# Patient Record
Sex: Female | Born: 1945 | Race: White | Hispanic: No | Marital: Married | Smoking: Never smoker
Health system: Southern US, Community
[De-identification: ages and names within clinical notes are randomized; demographics above are authoritative.]

## PROBLEM LIST (undated history)

## (undated) DIAGNOSIS — M1990 Unspecified osteoarthritis, unspecified site: Secondary | ICD-10-CM

## (undated) DIAGNOSIS — R590 Localized enlarged lymph nodes: Secondary | ICD-10-CM

## (undated) DIAGNOSIS — Z0000 Encounter for general adult medical examination without abnormal findings: Secondary | ICD-10-CM

## (undated) DIAGNOSIS — E782 Mixed hyperlipidemia: Secondary | ICD-10-CM

## (undated) DIAGNOSIS — M25511 Pain in right shoulder: Secondary | ICD-10-CM

## (undated) DIAGNOSIS — M25512 Pain in left shoulder: Secondary | ICD-10-CM

## (undated) DIAGNOSIS — B019 Varicella without complication: Secondary | ICD-10-CM

## (undated) DIAGNOSIS — E871 Hypo-osmolality and hyponatremia: Secondary | ICD-10-CM

## (undated) DIAGNOSIS — Z780 Asymptomatic menopausal state: Secondary | ICD-10-CM

## (undated) DIAGNOSIS — I679 Cerebrovascular disease, unspecified: Secondary | ICD-10-CM

## (undated) DIAGNOSIS — Z8619 Personal history of other infectious and parasitic diseases: Secondary | ICD-10-CM

## (undated) DIAGNOSIS — G4730 Sleep apnea, unspecified: Secondary | ICD-10-CM

## (undated) DIAGNOSIS — E663 Overweight: Secondary | ICD-10-CM

## (undated) DIAGNOSIS — K219 Gastro-esophageal reflux disease without esophagitis: Secondary | ICD-10-CM

## (undated) DIAGNOSIS — Z87898 Personal history of other specified conditions: Secondary | ICD-10-CM

## (undated) DIAGNOSIS — E876 Hypokalemia: Principal | ICD-10-CM

## (undated) DIAGNOSIS — E669 Obesity, unspecified: Secondary | ICD-10-CM

## (undated) DIAGNOSIS — T7840XA Allergy, unspecified, initial encounter: Secondary | ICD-10-CM

## (undated) DIAGNOSIS — J40 Bronchitis, not specified as acute or chronic: Secondary | ICD-10-CM

## (undated) DIAGNOSIS — M8580 Other specified disorders of bone density and structure, unspecified site: Secondary | ICD-10-CM

## (undated) DIAGNOSIS — I10 Essential (primary) hypertension: Secondary | ICD-10-CM

## (undated) DIAGNOSIS — N3281 Overactive bladder: Secondary | ICD-10-CM

## (undated) DIAGNOSIS — K5730 Diverticulosis of large intestine without perforation or abscess without bleeding: Secondary | ICD-10-CM

## (undated) DIAGNOSIS — K589 Irritable bowel syndrome without diarrhea: Secondary | ICD-10-CM

## (undated) HISTORY — DX: Overactive bladder: N32.81

## (undated) HISTORY — DX: Hypo-osmolality and hyponatremia: E87.1

## (undated) HISTORY — DX: Other specified disorders of bone density and structure, unspecified site: M85.80

## (undated) HISTORY — DX: Personal history of other infectious and parasitic diseases: Z86.19

## (undated) HISTORY — DX: Allergy, unspecified, initial encounter: T78.40XA

## (undated) HISTORY — DX: Varicella without complication: B01.9

## (undated) HISTORY — DX: Irritable bowel syndrome without diarrhea: K58.9

## (undated) HISTORY — DX: Personal history of other specified conditions: Z87.898

## (undated) HISTORY — DX: Sleep apnea, unspecified: G47.30

## (undated) HISTORY — DX: Hypokalemia: E87.6

## (undated) HISTORY — PX: TONSILLECTOMY: SUR1361

## (undated) HISTORY — DX: Gastro-esophageal reflux disease without esophagitis: K21.9

## (undated) HISTORY — DX: Overweight: E66.3

## (undated) HISTORY — DX: Cerebrovascular disease, unspecified: I67.9

## (undated) HISTORY — DX: Unspecified osteoarthritis, unspecified site: M19.90

## (undated) HISTORY — PX: CATARACT EXTRACTION, BILATERAL: SHX1313

## (undated) HISTORY — DX: Pain in left shoulder: M25.512

## (undated) HISTORY — DX: Bronchitis, not specified as acute or chronic: J40

## (undated) HISTORY — DX: Encounter for general adult medical examination without abnormal findings: Z00.00

## (undated) HISTORY — DX: Diverticulosis of large intestine without perforation or abscess without bleeding: K57.30

## (undated) HISTORY — DX: Asymptomatic menopausal state: Z78.0

## (undated) HISTORY — DX: Obesity, unspecified: E66.9

## (undated) HISTORY — DX: Mixed hyperlipidemia: E78.2

## (undated) HISTORY — DX: Localized enlarged lymph nodes: R59.0

## (undated) HISTORY — DX: Pain in right shoulder: M25.511

## (undated) HISTORY — DX: Essential (primary) hypertension: I10

---

## 1978-03-06 HISTORY — PX: BUNIONECTOMY: SHX129

## 1984-03-06 HISTORY — PX: BREAST REDUCTION SURGERY: SHX8

## 1999-01-22 ENCOUNTER — Encounter: Payer: Self-pay | Admitting: Emergency Medicine

## 1999-01-22 ENCOUNTER — Emergency Department (HOSPITAL_COMMUNITY): Admission: EM | Admit: 1999-01-22 | Discharge: 1999-01-22 | Payer: Self-pay | Admitting: Emergency Medicine

## 1999-10-12 ENCOUNTER — Other Ambulatory Visit: Admission: RE | Admit: 1999-10-12 | Discharge: 1999-10-12 | Payer: Self-pay | Admitting: Obstetrics and Gynecology

## 2001-01-17 ENCOUNTER — Other Ambulatory Visit: Admission: RE | Admit: 2001-01-17 | Discharge: 2001-01-17 | Payer: Self-pay | Admitting: Obstetrics and Gynecology

## 2002-03-13 ENCOUNTER — Other Ambulatory Visit: Admission: RE | Admit: 2002-03-13 | Discharge: 2002-03-13 | Payer: Self-pay | Admitting: Obstetrics and Gynecology

## 2003-09-10 ENCOUNTER — Other Ambulatory Visit: Admission: RE | Admit: 2003-09-10 | Discharge: 2003-09-10 | Payer: Self-pay | Admitting: Obstetrics and Gynecology

## 2004-05-19 ENCOUNTER — Ambulatory Visit: Payer: Self-pay | Admitting: Internal Medicine

## 2004-06-09 ENCOUNTER — Ambulatory Visit: Payer: Self-pay | Admitting: Internal Medicine

## 2004-08-22 ENCOUNTER — Ambulatory Visit: Payer: Self-pay | Admitting: Internal Medicine

## 2004-10-11 ENCOUNTER — Ambulatory Visit: Payer: Self-pay | Admitting: Internal Medicine

## 2005-01-18 ENCOUNTER — Other Ambulatory Visit: Admission: RE | Admit: 2005-01-18 | Discharge: 2005-01-18 | Payer: Self-pay | Admitting: Obstetrics and Gynecology

## 2006-08-22 ENCOUNTER — Ambulatory Visit: Payer: Self-pay | Admitting: Internal Medicine

## 2006-11-08 ENCOUNTER — Ambulatory Visit: Payer: Self-pay | Admitting: Internal Medicine

## 2006-11-08 LAB — CONVERTED CEMR LAB
ALT: 24 units/L (ref 0–35)
AST: 26 units/L (ref 0–37)
Albumin: 3.9 g/dL (ref 3.5–5.2)
Alkaline Phosphatase: 88 units/L (ref 39–117)
BUN: 6 mg/dL (ref 6–23)
Basophils Absolute: 0 10*3/uL (ref 0.0–0.1)
Basophils Relative: 0.8 % (ref 0.0–1.0)
Bilirubin Urine: NEGATIVE
Bilirubin, Direct: 0.1 mg/dL (ref 0.0–0.3)
CO2: 29 meq/L (ref 19–32)
CRP, High Sensitivity: 1 (ref 0.00–5.00)
Calcium: 9.5 mg/dL (ref 8.4–10.5)
Chloride: 105 meq/L (ref 96–112)
Cholesterol: 266 mg/dL (ref 0–200)
Creatinine, Ser: 0.7 mg/dL (ref 0.4–1.2)
Crystals: NEGATIVE
Eosinophils Absolute: 0.2 10*3/uL (ref 0.0–0.6)
Eosinophils Relative: 5.1 % — ABNORMAL HIGH (ref 0.0–5.0)
GFR calc Af Amer: 110 mL/min
GFR calc non Af Amer: 91 mL/min
Glucose, Bld: 100 mg/dL — ABNORMAL HIGH (ref 70–99)
HCT: 40.3 % (ref 36.0–46.0)
HDL: 38.2 mg/dL — ABNORMAL LOW (ref >39.0)
Hemoglobin, Urine: NEGATIVE
Hemoglobin: 13.9 g/dL (ref 12.0–15.0)
Ketones, ur: NEGATIVE mg/dL
Lymphocytes Relative: 43.7 % (ref 12.0–46.0)
MCHC: 34.5 g/dL (ref 30.0–36.0)
MCV: 83.3 fL (ref 78.0–100.0)
Monocytes Absolute: 0.5 10*3/uL (ref 0.2–0.7)
Monocytes Relative: 10.3 % (ref 3.0–11.0)
Mucus, UA: NEGATIVE
Neutro Abs: 2 10*3/uL (ref 1.4–7.7)
Neutrophils Relative %: 40.1 % — ABNORMAL LOW (ref 43.0–77.0)
Nitrite: NEGATIVE
Platelets: 289 10*3/uL (ref 150–400)
Potassium: 3.9 meq/L (ref 3.5–5.1)
RBC / HPF: NONE SEEN
RBC: 4.84 M/uL (ref 3.87–5.11)
RDW: 15.4 % — ABNORMAL HIGH (ref 11.5–14.6)
Sed Rate: 31 mm/hr — ABNORMAL HIGH (ref 0–25)
Sodium: 140 meq/L (ref 135–145)
Specific Gravity, Urine: 1.01 (ref 1.000–1.03)
TSH: 0.59 microintl units/mL (ref 0.35–5.50)
Total Bilirubin: 1 mg/dL (ref 0.3–1.2)
Total CHOL/HDL Ratio: 7
Total Protein, Urine: NEGATIVE mg/dL
Total Protein: 7.7 g/dL (ref 6.0–8.3)
Triglycerides: 124 mg/dL (ref 0–149)
Urine Glucose: NEGATIVE mg/dL
Urobilinogen, UA: 0.2 (ref 0.0–1.0)
VLDL: 25 mg/dL (ref 0–40)
Vit D, 1,25-Dihydroxy: 43 (ref 20–57)
WBC: 4.9 10*3/uL (ref 4.5–10.5)
pH: 7.5 (ref 5.0–8.0)

## 2006-11-15 ENCOUNTER — Ambulatory Visit: Payer: Self-pay | Admitting: Internal Medicine

## 2007-02-20 ENCOUNTER — Ambulatory Visit: Payer: Self-pay | Admitting: Internal Medicine

## 2007-02-20 DIAGNOSIS — K219 Gastro-esophageal reflux disease without esophagitis: Secondary | ICD-10-CM | POA: Insufficient documentation

## 2007-02-20 DIAGNOSIS — R05 Cough: Secondary | ICD-10-CM

## 2007-02-20 DIAGNOSIS — I10 Essential (primary) hypertension: Secondary | ICD-10-CM | POA: Insufficient documentation

## 2007-02-20 DIAGNOSIS — E782 Mixed hyperlipidemia: Secondary | ICD-10-CM

## 2007-02-20 DIAGNOSIS — R059 Cough, unspecified: Secondary | ICD-10-CM | POA: Insufficient documentation

## 2007-02-20 DIAGNOSIS — K5730 Diverticulosis of large intestine without perforation or abscess without bleeding: Secondary | ICD-10-CM | POA: Insufficient documentation

## 2007-02-20 HISTORY — DX: Mixed hyperlipidemia: E78.2

## 2007-03-13 ENCOUNTER — Ambulatory Visit: Payer: Self-pay | Admitting: Internal Medicine

## 2007-03-27 LAB — CONVERTED CEMR LAB
Cholesterol: 220 mg/dL (ref 0–200)
Direct LDL: 159 mg/dL
HDL: 34 mg/dL — ABNORMAL LOW (ref >39.0)
Total CHOL/HDL Ratio: 6.5
Triglycerides: 157 mg/dL — ABNORMAL HIGH (ref 0–149)
VLDL: 31 mg/dL (ref 0–40)

## 2007-04-03 ENCOUNTER — Ambulatory Visit: Payer: Self-pay | Admitting: Internal Medicine

## 2007-12-25 ENCOUNTER — Ambulatory Visit: Payer: Self-pay | Admitting: Internal Medicine

## 2007-12-27 LAB — CONVERTED CEMR LAB
CRP, High Sensitivity: 3 (ref 0.00–5.00)
Cholesterol: 241 mg/dL (ref 0–200)
Direct LDL: 159.2 mg/dL
HDL: 35.7 mg/dL — ABNORMAL LOW (ref >39.0)
Total CHOL/HDL Ratio: 6.8
Triglycerides: 146 mg/dL (ref 0–149)
VLDL: 29 mg/dL (ref 0–40)

## 2008-07-22 ENCOUNTER — Ambulatory Visit: Payer: Self-pay | Admitting: Internal Medicine

## 2008-07-22 LAB — CONVERTED CEMR LAB
ALT: 24 units/L (ref 0–35)
AST: 22 units/L (ref 0–37)
Albumin: 3.7 g/dL (ref 3.5–5.2)
Alkaline Phosphatase: 76 units/L (ref 39–117)
BUN: 12 mg/dL (ref 6–23)
Basophils Absolute: 0.1 10*3/uL (ref 0.0–0.1)
Basophils Relative: 1.4 % (ref 0.0–3.0)
Bilirubin, Direct: 0.1 mg/dL (ref 0.0–0.3)
CO2: 33 meq/L — ABNORMAL HIGH (ref 19–32)
CRP, High Sensitivity: 1 (ref 0.00–5.00)
Calcium: 9.8 mg/dL (ref 8.4–10.5)
Chloride: 106 meq/L (ref 96–112)
Cholesterol: 224 mg/dL — ABNORMAL HIGH (ref 0–200)
Creatinine, Ser: 0.8 mg/dL (ref 0.4–1.2)
Direct LDL: 166 mg/dL
Eosinophils Absolute: 0.3 10*3/uL (ref 0.0–0.7)
Eosinophils Relative: 5.3 % — ABNORMAL HIGH (ref 0.0–5.0)
GFR calc non Af Amer: 77.12 mL/min (ref >60)
Glucose, Bld: 95 mg/dL (ref 70–99)
HCT: 41.6 % (ref 36.0–46.0)
HDL: 32.2 mg/dL — ABNORMAL LOW (ref >39.00)
Hemoglobin: 14.5 g/dL (ref 12.0–15.0)
Lymphocytes Relative: 41.2 % (ref 12.0–46.0)
Lymphs Abs: 2.3 10*3/uL (ref 0.7–4.0)
MCHC: 34.9 g/dL (ref 30.0–36.0)
MCV: 90.2 fL (ref 78.0–100.0)
Monocytes Absolute: 0.6 10*3/uL (ref 0.1–1.0)
Monocytes Relative: 11.4 % (ref 3.0–12.0)
Neutro Abs: 2.2 10*3/uL (ref 1.4–7.7)
Neutrophils Relative %: 40.7 % — ABNORMAL LOW (ref 43.0–77.0)
Platelets: 272 10*3/uL (ref 150.0–400.0)
Potassium: 4.4 meq/L (ref 3.5–5.1)
RBC: 4.61 M/uL (ref 3.87–5.11)
RDW: 11.5 % (ref 11.5–14.6)
Sodium: 144 meq/L (ref 135–145)
TSH: 1.21 microintl units/mL (ref 0.35–5.50)
Total Bilirubin: 0.7 mg/dL (ref 0.3–1.2)
Total CHOL/HDL Ratio: 7
Total Protein: 7.2 g/dL (ref 6.0–8.3)
Triglycerides: 173 mg/dL — ABNORMAL HIGH (ref 0.0–149.0)
VLDL: 34.6 mg/dL (ref 0.0–40.0)
WBC: 5.5 10*3/uL (ref 4.5–10.5)

## 2008-07-30 ENCOUNTER — Ambulatory Visit: Payer: Self-pay | Admitting: Internal Medicine

## 2008-07-30 DIAGNOSIS — G4710 Hypersomnia, unspecified: Secondary | ICD-10-CM | POA: Insufficient documentation

## 2008-09-23 ENCOUNTER — Ambulatory Visit: Payer: Self-pay | Admitting: Internal Medicine

## 2008-10-07 ENCOUNTER — Telehealth (INDEPENDENT_AMBULATORY_CARE_PROVIDER_SITE_OTHER): Payer: Self-pay | Admitting: *Deleted

## 2009-01-13 ENCOUNTER — Ambulatory Visit: Payer: Self-pay | Admitting: Internal Medicine

## 2009-01-13 DIAGNOSIS — J310 Chronic rhinitis: Secondary | ICD-10-CM

## 2009-01-13 HISTORY — DX: Chronic rhinitis: J31.0

## 2009-01-13 LAB — CONVERTED CEMR LAB
Cholesterol: 198 mg/dL (ref 0–200)
HDL: 32.1 mg/dL — ABNORMAL LOW (ref >39.00)
LDL Cholesterol: 135 mg/dL — ABNORMAL HIGH (ref 0–99)
Total CHOL/HDL Ratio: 6
Triglycerides: 157 mg/dL — ABNORMAL HIGH (ref 0.0–149.0)
VLDL: 31.4 mg/dL (ref 0.0–40.0)

## 2009-01-14 LAB — CONVERTED CEMR LAB: Hep B S Ab: NEGATIVE

## 2009-03-06 HISTORY — PX: COLONOSCOPY: SHX174

## 2009-04-02 ENCOUNTER — Encounter (INDEPENDENT_AMBULATORY_CARE_PROVIDER_SITE_OTHER): Payer: Self-pay | Admitting: *Deleted

## 2009-04-09 ENCOUNTER — Telehealth: Payer: Self-pay | Admitting: Internal Medicine

## 2009-04-15 ENCOUNTER — Encounter (INDEPENDENT_AMBULATORY_CARE_PROVIDER_SITE_OTHER): Payer: Self-pay | Admitting: *Deleted

## 2009-04-29 ENCOUNTER — Encounter (INDEPENDENT_AMBULATORY_CARE_PROVIDER_SITE_OTHER): Payer: Self-pay | Admitting: *Deleted

## 2009-04-30 ENCOUNTER — Ambulatory Visit: Payer: Self-pay | Admitting: Gastroenterology

## 2009-05-05 ENCOUNTER — Ambulatory Visit: Payer: Self-pay | Admitting: Internal Medicine

## 2009-05-05 LAB — CONVERTED CEMR LAB
BUN: 11 mg/dL (ref 6–23)
CO2: 31 meq/L (ref 19–32)
CRP, High Sensitivity: 2.7 (ref 0.00–5.00)
Calcium: 9.1 mg/dL (ref 8.4–10.5)
Chloride: 103 meq/L (ref 96–112)
Cholesterol: 221 mg/dL — ABNORMAL HIGH (ref 0–200)
Creatinine, Ser: 0.8 mg/dL (ref 0.4–1.2)
Direct LDL: 160.8 mg/dL
GFR calc non Af Amer: 76.92 mL/min (ref >60)
Glucose, Bld: 88 mg/dL (ref 70–99)
HDL: 43.6 mg/dL (ref >39.00)
Potassium: 3.9 meq/L (ref 3.5–5.1)
Sodium: 142 meq/L (ref 135–145)
TSH: 0.87 microintl units/mL (ref 0.35–5.50)
Total CHOL/HDL Ratio: 5
Triglycerides: 125 mg/dL (ref 0.0–149.0)
VLDL: 25 mg/dL (ref 0.0–40.0)

## 2009-05-12 ENCOUNTER — Ambulatory Visit: Payer: Self-pay | Admitting: Gastroenterology

## 2009-10-04 LAB — HM PAP SMEAR

## 2009-12-28 ENCOUNTER — Telehealth (INDEPENDENT_AMBULATORY_CARE_PROVIDER_SITE_OTHER): Payer: Self-pay | Admitting: *Deleted

## 2010-01-13 ENCOUNTER — Ambulatory Visit: Payer: Self-pay | Admitting: Internal Medicine

## 2010-01-13 ENCOUNTER — Encounter: Payer: Self-pay | Admitting: Internal Medicine

## 2010-01-13 DIAGNOSIS — L820 Inflamed seborrheic keratosis: Secondary | ICD-10-CM | POA: Insufficient documentation

## 2010-01-13 DIAGNOSIS — R079 Chest pain, unspecified: Secondary | ICD-10-CM | POA: Insufficient documentation

## 2010-04-07 NOTE — Letter (Signed)
Summary: [facility] Instructions  [facility]  [address] [place], [place] [address]   Phone: [phone]  Fax: [phone]       Erika [name]    07/13/1945    MRN: [id number]        Procedure Day Dorna Bloom:  [facility]  05/12/09     Arrival Time:  10:30AM     Procedure Time:  11:30AM     Location of Procedure:                    _X _  [facility] (4th Floor)                        PREPARATION FOR COLONOSCOPY WITH MOVIPREP   Starting 5 days prior to your procedure 05/07/09 do not eat nuts, seeds, popcorn, corn, beans, peas,  salads, or any raw vegetables.  Do not take any fiber supplements (e.g. Metamucil, Citrucel, and Benefiber).  THE DAY BEFORE YOUR PROCEDURE         DATE: 05/11/09  DAY: TUESDAY  1.  Drink clear liquids the entire day-NO SOLID FOOD  2.  Do not drink anything colored red or purple.  Avoid juices with pulp.  No orange juice.  3.  Drink at least 64 oz. (8 glasses) of fluid/clear liquids during the day to prevent dehydration and help the prep work efficiently.  CLEAR LIQUIDS INCLUDE: Water Jello Ice Popsicles Tea (sugar ok, no milk/cream) Powdered fruit flavored drinks Coffee (sugar ok, no milk/cream) Gatorade Juice: apple, white grape, white cranberry  Lemonade Clear bullion, consomm, broth Carbonated beverages (any kind) Strained chicken noodle soup Hard Candy                             4.  In the morning, mix first dose of MoviPrep solution:    Empty 1 Pouch A and 1 Pouch B into the disposable container    Add lukewarm drinking water to the top line of the container. Mix to dissolve    Refrigerate (mixed solution should be used within 24 hrs)  5.  Begin drinking the prep at 5:00 p.m. The MoviPrep container is divided by 4 marks.   Every 15 minutes drink the solution down to the next mark (approximately 8 oz) until the full liter is complete.   6.  Follow completed prep with 16 oz of clear liquid of your choice  (Nothing red or purple).  Continue to drink clear liquids until bedtime.  7.  Before going to bed, mix second dose of MoviPrep solution:    Empty 1 Pouch A and 1 Pouch B into the disposable container    Add lukewarm drinking water to the top line of the container. Mix to dissolve    Refrigerate  THE DAY OF YOUR PROCEDURE      DATE: 05/12/09  DAY: WEDNESDAY  Beginning at 6:30AM (5 hours before procedure):         1. Every 15 minutes, drink the solution down to the next mark (approx 8 oz) until the full liter is complete.  2. Follow completed prep with 16 oz. of clear liquid of your choice.    3. You may drink clear liquids until 9:30AM (2 HOURS BEFORE PROCEDURE).   MEDICATION INSTRUCTIONS  Unless otherwise instructed, you should take regular prescription medications with a small sip of water   as early as possible the morning  of your procedure.        OTHER INSTRUCTIONS  You will need a responsible adult at least 65 years of age to accompany you and drive you home.   This person must remain in the waiting room during your procedure.  Wear loose fitting clothing that is easily removed.  Leave jewelry and other valuables at home.  However, you may wish to bring a book to read or  an iPod/MP3 player to listen to music as you wait for your procedure to start.  Remove all body piercing jewelry and leave at home.  Total time from sign-in until discharge is approximately 2-3 hours.  You should go home directly after your procedure and rest.  You can resume normal activities the  day after your procedure.  The day of your procedure you should not:   Drive   Make legal decisions   Operate machinery   Drink alcohol   Return to work  You will receive specific instructions about eating, activities and medications before you leave.    The above instructions have been reviewed and explained to me by   Wyona Almas RN  April 30, 2009 9:05 AM     I fully  understand and can verbalize these instructions _____________________________ Date _________

## 2010-04-07 NOTE — Miscellaneous (Signed)
Summary: [facility] Previsit/prep  Clinical Lists Changes  Medications: Added new medication of MOVIPREP 100 GM  SOLR (PEG-KCL-NACL-NASULF-NA ASC-C) As per prep instructions. - Signed Rx of MOVIPREP 100 GM  SOLR (PEG-KCL-NACL-NASULF-NA ASC-C) As per prep instructions.;  #1 x 0;  Signed;  Entered by: Wyona Almas RN;  Authorized by: Louis Meckel MD;  Method used: [facility] to [facility] [phone]*, [address] [place], [place]  [address], Ph: [phone] or [phone], Fax: [phone] Observations: Added new observation of NKA: T (04/30/2009 8:05)    Prescriptions: MOVIPREP 100 GM  SOLR (PEG-KCL-NACL-NASULF-NA ASC-C) As per prep instructions.  #1 x 0   Entered by:   Wyona Almas RN   Authorized by:   Louis Meckel MD   Signed by:   Wyona Almas RN on 04/30/2009   Method used:   [facility] to        [facility] [phone]* (retail)       [address]       [place], [place]  [address]       Ph: [phone] or [phone]       Fax: [phone]   RxID:   [id number]

## 2010-04-07 NOTE — Assessment & Plan Note (Signed)
Summary: Primary svc/ ext ov re cp/ bp rx   Primary Rhen Kawecki/Referring Cylee Dattilo:  Sherene Sires  CC:  HA and CP when lies down x 10 wks- pain is "dull and tight"..  History of Present Illness: 65 yowf never smoker  with moderate obesity complicated by hyperlipidemia and hypertension.  December 25, 2007 ov discouraged re wt loss, doing a "dial in" nutritionist per insurance. No ex cp, tia or claudication symptoms.  Jul 22, 2008 ov eval of cough that started around Christmas 2009  with nasal drainage green and sore throat and all resolved after treatment by dentist with amox except persistant sensation drainage of drainage day > night better with mucinex,  not sob.  sputum is tbsp at a time thick white.   Jul 30, 2008 --Returns for 1 week follow up, med review and lab review.  1. Cough is 60% better. Last visit given prednisone taper, reflux prevention w/ prilosec and zantac.  still has throat cleaing and ? drainage.  . Metorpolol changed to bystolic 2. Labs showed cholestrol borderline w/ LDL at 166, HDL 33, crp low at 1. Discussed several options w/ pt on starting meds-statin, vs diet exercise and wt loss. she has htn, and family hx of stroke. LDL goal <130. she prefers trial of diet/exercise and wt loss for 4-6 months then recheck.  3. c/o low energy, fatigue and daytime sleepiness, snores. discussed possible sleep apnea. she wishes to change diet, exercise and lose weight if still having symptoms will consider sleep study at next ov in 4-6 months.  4. c/o reflux at night, not taking prilosec two times a day (only once daily most days. )  September 23, 2008 --Returns for follow up. Pt is feeling much better. Cough is totally resolved. Feeling close to baseline. Tolerating Bystolic w/out known trouble. Reflux is improved .    January 13, 2009 Followup.  Pt c/p runny nose and cough x several months.  She states that her cough is sometimes prod with yellow sputum. better on alerest     May 05, 2009  cc  f/u hbp. Pt denies any significant sore throat, dysphagia, itching, sneezing,  nasal congestion or excess secretions,  fever, chills, sweats, unintended wt loss, pleuritic or exertional cp, hempoptysis.   page 2 January 13, 2010 ov cc new onset cp first develped 65 y ago attributed to gerd and resolved on treatment then recurred sporadically but much worse since [facility] september 2011  has had four episodes last 3 weeks ago, lasting typically 30 min,  center of chest no radiating no nausea, sweat, sob.  not reproducible with deep breathing or related to ex, usually occur at rest or lying down.  Pt denies any significant sore throat, dysphagia, itching, sneezing,  nasal congestion or excess secretions,  fever, chills, sweats, unintended wt loss,  classically pleuritic or exertional cp, hempoptysis, change in activity tolerance  orthopnea pnd or leg swelling   Pt also denies any obvious fluctuation in symptoms with weather or environmental change or other alleviating or aggravating factors.       Current Medications (verified): 1)  Sular 34 Mg  Tb24 (Nisoldipine) .... One Q Am 2)  Bayer Low Strength 81 Mg  Tbec (Aspirin) .... Take 1 Tablet By Mouth Once A Day 3)  Citracal Plus   Tabs (Multiple Minerals-Vitamins) .Marland Kitchen.. 1 Once Daily 4)  Multivitamins   Tabs (Multiple Vitamin) .... Take 1 Tablet By Mouth Once A Day 5)  Sanctura 20 Mg Tabs (Trospium Chloride) .Marland KitchenMarland KitchenMarland Kitchen  1 Once Daily 6)  Tylenol Pm Extra Strength 500-25 Mg  Tabs (Diphenhydramine-Apap (Sleep)) .... Take 1 Tab By Mouth At Bedtime As Needed 7)  Tums 500 Mg  Chew (Calcium Carbonate Antacid) .... As Needed 8)  Delsym 30 Mg/30ml Lqcr (Dextromethorphan Polistirex) .... 2 Tsp Every 12 Hours As Needed 9)  Zyrtec Allergy 10 Mg Tabs (Cetirizine Hcl) .Marland Kitchen.. 1 At Bedtime As Needed 10)  Mucinex Dm 30-600 Mg Xr12h-Tab (Dextromethorphan-Guaifenesin) .Marland Kitchen.. 1 To 2 Every 12 Hours As Needed 11)  Allergy Relief 4 Mg Tabs (Chlorpheniramine Maleate) .Marland Kitchen.. 1 Once Daily As  Needed 12)  Omeprazole 20 Mg Cpdr (Omeprazole) .Marland Kitchen.. 1 Every Am 13)  Bystolic 10 Mg Tabs (Nebivolol Hcl) .Marland Kitchen.. 1 Once Daily  Allergies (verified): No Known Drug Allergies  Past History:  Past Medical History: DIVERTICULOSIS OF COLON (ICD-562.10)C. colonoscopy 04/14/99 GERD (ICD-530.81) HYPERLIPIDEMIA (ICD-272.4)      Goal LDL < 130 HBP/Pos fm hx HYPERTENSION (ICD-401.9) OBESITY   -  Target wt  =   179  for BMI < 30   161 ideal HEALTH MAINTENANCE............................................................Marland KitchenWert/[place]    -  Calendar done 02/20/07, redone January 13, 2010     -  Td 11/2006    -  Pneumovax Jul 22, 2008     - CPX  Jul 22, 2008   Vital Signs:  Patient profile:   65 year old female Weight:      178 pounds O2 Sat:      96 % on Room air Temp:     97.6 degrees F oral Pulse rate:   54 / minute BP sitting:   138 / 82  (left arm)  Vitals Entered By: Vernie Murders (January 13, 2010 8:51 AM)  O2 Flow:  Room air  Physical Exam  Additional Exam:   wt 180 > 171 January 13, 2009 > 177 May 05, 2009 > 178 January 13, 2010  Ambulatory healthy appearing in no acute distress. Afeb with normal vital signs HEENT: nl dentition, turbinates, and orophanx. Nl external ear canals without cough reflex Neck without JVD/Nodes/TM Lungs clear to A and P bilaterally without cough on insp or exp maneuvers RRR no s3 or murmur or increase in P2 Abd soft and benign with nl excursion in the supine position. No bruits or organomegaly Ext warm without calf tenderness, cyanosis clubbing or edema Skin warm and dry with inlfammed seb keratosis mid back     Impression & Recommendations:  Problem # 1:  CHEST PAIN (ICD-786.50) Recurrent pattern x years and not following med calndar re maint rx with ppi and hs h2.  no features to suggest ischemia.   See instructions for specific recommendations    Each maintenance medication was reviewed in detail including most importantly the difference  between maintenance and as needed and under what circumstances the prns are to be used. This was done in the context of a medication calendar review which provided the patient with a user-friendly unambiguous mechanism for medication administration and reconciliation and provides an action plan for all active problems. It is critical that this be shown to every doctor  for modification during the office visit if necessary so the patient can use it as a working document.   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 One q am    Bystolic 10  Mg Tabs (Nebivolol hcl) .Marland Kitchen... 1 once daily     Medications Added to Medication List This Visit: 1)  Omeprazole 20 Mg Cpdr (Omeprazole) .Marland Kitchen.. 1 every am 2)  Bystolic 10 Mg Tabs (Nebivolol hcl) .Marland Kitchen.. 1 once daily  Other Orders: Est. Patient Level IV ([address])  Patient Instructions: 1)  See Patient Care Coordinator before leaving for dermatology eval of sebhorrheic keratosis. 2)  See calendar for specific medication instructions and bring it back for each and every office visit for every healthcare [place] you see.  Without it,  you may not receive the best quality medical care that we feel you deserve.  3)  Please schedule a follow-up appointment in 6 weeks, sooner if needed  Prescriptions: BYSTOLIC 10 MG TABS (NEBIVOLOL HCL) 1 once daily  #34 x 11   Entered and Authorized by:   Nyoka Cowden MD   Signed by:   Nyoka Cowden MD on 01/13/2010   Method used:   [facility] [phone]* (retail)       [address]       [place], [place]  [address]       Ph: [phone] or [phone]       Fax: [phone]   RxID:   [id number]

## 2010-04-07 NOTE — Procedures (Signed)
Summary: Colonoscopy  Patient: Erika [name] Note: All result statuses are Final unless otherwise noted.  Tests: (1) Colonoscopy (COL)   COL Colonoscopy           DONE     [facility]     [address]     [place], [place]  [address]           COLONOSCOPY PROCEDURE REPORT           PATIENT:  Erika, [name]  MR#:  [id number]     BIRTHDATE:  [birth year], 63 yrs. old  GENDER:  female           ENDOSCOPIST:  Barbette Hair. Arlyce Dice, MD     Referred by:  Charlaine Dalton. Sherene Sires, M.D.           PROCEDURE DATE:  05/12/2009     PROCEDURE:  Colonoscopy, Diagnostic     ASA CLASS:  Class II     INDICATIONS:  Routine Risk Screening           MEDICATIONS:   Fentanyl 50 mcg IV, Versed 9 mg IV           DESCRIPTION OF PROCEDURE:   After the risks benefits and     alternatives of the procedure were thoroughly explained, informed     consent was obtained.  Digital rectal exam was performed and     revealed no abnormalities.   The LB [id number] endoscope     was introduced through the anus and advanced to the cecum, which     was identified by both the appendix and ileocecal valve, without     limitations.  The quality of the prep was excellent, using     MoviPrep.  The instrument was then slowly withdrawn as the colon     was fully examined.     <<PROCEDUREIMAGES>>           FINDINGS:  Moderate diverticulosis was found in the sigmoid colon     (see image12).  This was otherwise a normal examination of the     colon (see image1, image3, image5, image6, image7, image8,     image13, and image14).   Retroflexed views in the rectum revealed     no abnormalities.    The scope was then withdrawn from the patient     and the procedure completed.           COMPLICATIONS:  None           ENDOSCOPIC IMPRESSION:     1) Moderate diverticulosis in the sigmoid colon     2) Otherwise normal examination     RECOMMENDATIONS:     1) Continue current colorectal screening recommendations for  "routine risk" patients with a repeat colonoscopy in 10 years.           REPEAT EXAM:  In 10 year(s) for Colonoscopy.           ______________________________     Barbette Hair. Arlyce Dice, MD           CC:           n.     eSIGNED:   Barbette Hair. Ricki Vanhandel at 05/12/2009 12:24 PM           Hilbert Corrigan, [id number]  Note: An exclamation mark (!) indicates a result that was not dispersed into the flowsheet. Document Creation Date: 05/12/2009 12:24 PM _______________________________________________________________________  (1) Order result status: Final Collection or observation date-time: 05/12/2009 12:16  Requested date-time:  Receipt date-time:  Reported date-time:  Referring Physician:   Ordering Physician: Melvia Heaps [phone]) Specimen Source:  Source: Launa Grill Order Number: [id number] Lab site:   Appended Document: Colonoscopy    Clinical Lists Changes  Observations: Added new observation of COLONNXTDUE: 05/2019 (05/12/2009 13:37)

## 2010-04-07 NOTE — Assessment & Plan Note (Signed)
Summary: Primary svc/ ext ov   Primary Provider/Referring Provider:  Sherene Sires  CC:  Followup.   Pt states no complaints today.  She states that her insurance is about to run out and she wanted to followup before then.  Denies any complaints today.Marland Kitchen  History of Present Illness: 81 yowf never smoker  with moderate obesity complicated by hyperlipidemia and hypertension.  December 25, 2007 ov discouraged re wt loss, doing a "dial in" nutritionist per insurance. No ex cp, tia or claudication symptoms.  Jul 22, 2008 ov eval of cough that started around Christmas 2009  with nasal drainage green and sore throat and all resolved after treatment by dentist with amox except persistant sensation drainage of drainage day > night better with mucinex,  not sob.  sputum is tbsp at a time thick white.   Jul 30, 2008 --Returns for 1 week follow up, med review and lab review.  1. Cough is 60% better. Last visit given prednisone taper, reflux prevention w/ prilosec and zantac.  still has throat cleaing and ? drainage.  . Metorpolol changed to bystolic 2. Labs showed cholestrol borderline w/ LDL at 166, HDL 33, crp low at 1. Discussed several options w/ pt on starting meds-statin, vs diet exercise and wt loss. she has htn, and family hx of stroke. LDL goal <130. she prefers trial of diet/exercise and wt loss for 4-6 months then recheck.  3. c/o low energy, fatigue and daytime sleepiness, snores. discussed possible sleep apnea. she wishes to change diet, exercise and lose weight if still having symptoms will consider sleep study at next ov in 4-6 months.  4. c/o reflux at night, not taking prilosec two times a day (only once daily most days. )  September 23, 2008 --Returns for follow up. Pt is feeling much better. Cough is totally resolved. Feeling close to baseline. Tolerating Bystolic w/out known trouble. Reflux is improved .    January 13, 2009 Followup.  Pt c/p runny nose and cough x several months.  She states that  her cough is sometimes prod with yellow sputum. better on alerex  May 05, 2009 Followup.   Pt states no complaints today. Pt denies any significant sore throat, dysphagia, itching, sneezing,  nasal congestion or excess secretions,  fever, chills, sweats, unintended wt loss, pleuritic or exertional cp, hempoptysis.   Current Medications (verified): 1)  Evista 60 Mg  Tabs (Raloxifene Hcl) .... Take 1 Tablet By Mouth Once A Day 2)  Sular 34 Mg  Tb24 (Nisoldipine) .... One Q Am 3)  Bayer Low Strength 81 Mg  Tbec (Aspirin) .... Take 1 Tablet By Mouth Once A Day 4)  Citracal Plus   Tabs (Multiple Minerals-Vitamins) .Marland Kitchen.. 1 Once Daily 5)  Bystolic 10 Mg  Tabs (Nebivolol Hcl) .... One Tablet Daily 6)  Multivitamins   Tabs (Multiple Vitamin) .... Take 1 Tablet By Mouth Once A Day 7)  Sanctura 20 Mg Tabs (Trospium Chloride) .Marland Kitchen.. 1 Once Daily 8)  Tylenol Pm Extra Strength 500-25 Mg  Tabs (Diphenhydramine-Apap (Sleep)) .... Take 1 Tab By Mouth At Bedtime As Needed 9)  Advil 100 Mg  Tabs (Ibuprofen) .... Use As Directed 10)  Tums 500 Mg  Chew (Calcium Carbonate Antacid) .... As Needed 11)  Delsym 30 Mg/5ml Lqcr (Dextromethorphan Polistirex) .... 2 Tsp Every 12 Hours As Needed 12)  Zyrtec Allergy 10 Mg Tabs (Cetirizine Hcl) .Marland Kitchen.. 1 At Bedtime As Needed 13)  Moviprep 100 Gm  Solr (Peg-Kcl-Nacl-Nasulf-Na Asc-C) .... As Per Prep  Instructions. 14)  Mucinex Dm 30-600 Mg Xr12h-Tab (Dextromethorphan-Guaifenesin) .Marland Kitchen.. 1 To 2 Every 12 Hours As Needed 15)  Allergy Relief 4 Mg Tabs (Chlorpheniramine Maleate) .Marland Kitchen.. 1 Once Daily As Needed  Allergies (verified): No Known Drug Allergies  Past History:  Past Medical History: DIVERTICULOSIS OF COLON (ICD-562.10)C. colonoscopy 04/14/99 GERD (ICD-530.81) HYPERLIPIDEMIA (ICD-272.4)      Goal LDL < 130 HBP/Pos fm hx HYPERTENSION (ICD-401.9) OBESITY   -  Target wt  =   179  for BMI < 30   161 ideal HEALTH  MAINTENANCE............................................................Marland KitchenWert/Holland    -  Calendar done 02/20/07    -  Td 11/2006    -  Pneumovax Jul 22, 2008     - CPX  Jul 22, 2008   Vital Signs:  Patient profile:   65 year old female Weight:      177.50 pounds BMI:     28.75 O2 Sat:      95 % on Room air Temp:     97.7 degrees F oral Pulse rate:   62 / minute BP sitting:   130 / 80  (left arm)  Vitals Entered By: Vernie Murders (May 05, 2009 10:38 AM)  O2 Flow:  Room air  Physical Exam  Additional Exam:   wt 180 > 171 January 13, 2009 > 177 May 05, 2009  Ambulatory healthy appearing in no acute distress. Afeb with normal vital signs HEENT: nl dentition, turbinates, and orophanx. Nl external ear canals without cough reflex Neck without JVD/Nodes/TM Lungs clear to A and P bilaterally without cough on insp or exp maneuvers RRR no s3 or murmur or increase in P2 Abd soft and benign with nl excursion in the supine position. No bruits or organomegaly Ext warm without calf tenderness, cyanosis clubbing or edema Skin warm and dry without lesions     Sodium                    142 mEq/L                   135-145   Potassium                 3.9 mEq/L                   3.5-5.1   Chloride                  103 mEq/L                   96-112   Carbon Dioxide            31 mEq/L                    19-32   Glucose                   88 mg/dL                    40-98   BUN                       11 mg/dL                    1-19   Creatinine                0.8 mg/dL  0.4-1.2   Calcium                   9.1 mg/dL                   8.1-19.1   GFR                       76.92 mL/min                >60  Tests: (2) Lipid Panel (LIPID)   Cholesterol          [H]  221 mg/dL                   4-782     ATP III Classification            Desirable:  < 200 mg/dL                    Borderline High:  200 - 239 mg/dL               High:  > = 240 mg/dL   Triglycerides              125.0 mg/dL                 9.5-621.3     Normal:  <150 mg/dL     Borderline High:  086 - 199 mg/dL   HDL                       57.84 mg/dL                 >69.62   VLDL Cholesterol          25.0 mg/dL                  9.5-28.4  CHO/HDL Ratio:  CHD Risk                             5                    Men          Women     1/2 Average Risk     3.4          3.3     Average Risk          5.0          4.4     2X Average Risk          9.6          7.1     3X Average Risk          15.0          11.0                           Tests: (3) Full Range CRP (FCRP)   Full Range CRP            2.70 mg/L                   0.00-5.00     Note:  An elevated hs-CRP (>5 mg/L) should be repeated after 2 weeks to rule out recent infection or trauma.  Tests: (4) TSH (TSH)   FastTSH  0.87 uIU/mL                 0.35-5.50  Tests: (5) Cholesterol LDL - Direct (DIRLDL)  Cholesterol LDL - Direct                             160.8 mg/dL  Impression & Recommendations:  Problem # 1:  HYPERLIPIDEMIA (ICD-272.4)  Goal LDL < 130 HBP/Pos fm hx  Labs Reviewed: SGOT: 22 (07/22/2008)   SGPT: 24 (07/22/2008)   HDL:32.10 (01/13/2009), 32.20 (07/22/2008)  LDL:135 (01/13/2009) > 161 May 05, 2009 so can get back down to target with diet   DEL (12/25/2007)  Chol:198 (01/13/2009), 224 (07/22/2008)  Trig:157.0 (01/13/2009), 173.0 (07/22/2008)  Problem # 2:  HYPERTENSION (ICD-401.9)  The following medications were removed from the medication list:    Bystolic 10 Mg Tabs (Nebivolol hcl) ..... One tablet daily Her updated medication list for this problem includes:    Sular 34 Mg Tb24 (Nisoldipine) ..... One q am   Each maintenance medication was reviewed in detail including most importantly the difference between maintenance and as needed and under what circumstances the prns are to be used. This was done in the context of a medication calendar review which provided the patient with a user-friendly  unambiguous mechanism for medication administration and reconciliation and provides an action plan for all active problems. It is critical that this be shown to every doctor  for modification during the office visit if necessary so the patient can use it as a working document.   Problem # 3:  GERD (ICD-530.81) ok rx with diet only, contingencies discussed  The following medications were removed from the medication list:    Ranitidine Hcl 150 Mg Caps (Ranitidine hcl) ..... One at bedtime Her updated medication list for this problem includes:    Tums 500 Mg Chew (Calcium carbonate antacid) .Marland Kitchen... As needed  Medications Added to Medication List This Visit: 1)  Citracal Plus Tabs (Multiple minerals-vitamins) .Marland Kitchen.. 1 once daily 2)  Mucinex Dm 30-600 Mg Xr12h-tab (Dextromethorphan-guaifenesin) .Marland Kitchen.. 1 to 2 every 12 hours as needed 3)  Allergy Relief 4 Mg Tabs (Chlorpheniramine maleate) .Marland Kitchen.. 1 once daily as needed  Other Orders: TLB-BMP (Basic Metabolic Panel-BMET) (80048-METABOL) TLB-Lipid Panel (80061-LIPID) TLB-CRP-High Sensitivity (C-Reactive Protein) (86140-FCRP) TLB-TSH (Thyroid Stimulating Hormone) (84443-TSH) Est. Patient Level IV (32440)  Patient Instructions: 1)  we will mail your labs to you and call you if any changes are needed 2)  Return yearly to renew your blood pressure meds

## 2010-04-07 NOTE — Letter (Signed)
Summary: Previsit letter  [facility]  [address] [place], [place] [address]   Phone: [phone]  Fax: [phone]       04/15/2009 MRN: [id number]  Erika [name] [address] [place], [place]  [address]  Dear [name],  Welcome to the Gastroenterology Division at [facility].    You are scheduled to see a nurse for your pre-procedure visit on 04-30-09 at 8:30a.m. on the 3rd floor at [facility], [address]  We ask that you try to arrive at our office 15 minutes prior to your appointment time to allow for check-in.  Your nurse visit will consist of discussing your medical and surgical history, your immediate family medical history, and your medications.    Please bring a complete list of all your medications or, if you prefer, bring the medication bottles and we will list them.  We will need to be aware of both prescribed and over the counter drugs.  We will need to know exact dosage information as well.  If you are on blood thinners (Coumadin, Plavix, Aggrenox, Ticlid, etc.) please call our office today/prior to your appointment, as we need to consult with your physician about holding your medication.   Please be prepared to read and sign documents such as consent forms, a financial agreement, and acknowledgement forms.  If necessary, and with your consent, a friend or relative is welcome to sit-in on the nurse visit with you.  Please bring your insurance card so that we may make a copy of it.  If your insurance requires a referral to see a specialist, please bring your referral form from your primary care physician.  No co-pay is required for this nurse visit.     If you cannot keep your appointment, please call [phone] to cancel or reschedule prior to your appointment date.  This allows [place] the opportunity to schedule an appointment for another patient in need of care.    Thank you for choosing [facility] for your  medical needs.  We appreciate the opportunity to care for you.  Please visit [place] at our website  to learn more about our practice.                     Sincerely.                                                                                                                   The Gastroenterology Division

## 2010-04-07 NOTE — Progress Notes (Signed)
Summary: rx  Phone Note Call from Patient Call back at Home Phone [phone]   Caller: Patient Call For: Sharna Gabrys Reason for Call: Refill Medication Summary of Call: need refill on Sular called in to pharm. [facility] mail order rx Initial call taken by: Eugene Gavia,  April 09, 2009 9:50 AM  Follow-up for Phone Call        rx sent. pt aware. Carron Curie CMA  April 09, 2009 10:50 AM     Prescriptions: SULAR 34 MG  TB24 (NISOLDIPINE) one q am  #90 x 1   Entered by:   Carron Curie CMA   Authorized by:   Nyoka Cowden MD   Signed by:   Carron Curie CMA on 04/09/2009   Method used:   Faxed to ...       [facility] (mail-order)             , [place]         Ph: [phone]       Fax: [phone]   RxID:   [id number]

## 2010-04-07 NOTE — Progress Notes (Signed)
Summary: Bystolic refill - need clarification with ov before refills   Phone Note Call from Patient Call back at Work Phone [phone]   Caller: Patient Call For: wert Reason for Call: Talk to Nurse Summary of Call: Patient requesting refill bystolic10mg [facility]  Patient has an appt w/ Wert on 11/17. Initial call taken by: Lehman Prom,  December 28, 2009 10:36 AM  Follow-up for Phone Call        Bystolic is not on med lsit. The patient says she has been taking both Bystolic and Sular. Dr. Sherene Sires please clarify meds the patient should be taking for her BP.Michel Bickers [facility]  December 28, 2009 11:30 AM sorry for the confusion but  her meds on our list  are supposed to match up 100% with her calendar so we must have an error in our list or her calendar (in either case we have a problem)  Needs ov with all meds and calendar in hand to recheck bp before filling both (can give samples of what she's taking if she needs) Follow-up by: Nyoka Cowden MD,  December 28, 2009 3:59 PM  Additional Follow-up for Phone Call Additional follow up Details #1::        LMOMTCB Vernie Murders  December 28, 2009 4:53 PM  pt returned call.  informed pt of MW's recs as stated above.  pt verbalized her understanding.  i informed pt that per her last ov w/ MW, the bystolic was removed from her med list but she stated that the instruction paper she was given still has the bystolic on it.  appt scheduled w/ MW 11.10.11, pt to bring all meds with her to ov.  samples of bystolic 10mg  left up front for pt to pick up at her convenience.  pt verbalized her understanding.  Additional Follow-up by: Boone Master CNA/MA,  December 29, 2009 9:53 AM

## 2010-04-07 NOTE — Letter (Signed)
Summary: Colonoscopy Letter  [facility]  [address] [place], [place] [address]   Phone: [phone]  Fax: [phone]      April 02, 2009 MRN: [id number]   RUTA CAPECE [address] [place], [place]  [address]   Dear Ms. Noboa,   According to your medical record, it is time for you to schedule a Colonoscopy. The American Cancer Society recommends this procedure as a method to detect early colon cancer. Patients with a family history of colon cancer, or a personal history of colon polyps or inflammatory bowel disease are at increased risk.  This letter has beeen generated based on the recommendations made at the time of your procedure. If you feel that in your particular situation this may no longer apply, please contact our office.  Please call our office at [phone] to schedule this appointment or to update your records at your earliest convenience.  Thank you for cooperating with [place] to provide you with the very best care possible.   Sincerely,  Barbette Hair. Arlyce Dice, M.D.  [facility] [phone]

## 2010-06-06 ENCOUNTER — Encounter: Payer: Self-pay | Admitting: Internal Medicine

## 2010-06-09 ENCOUNTER — Ambulatory Visit (INDEPENDENT_AMBULATORY_CARE_PROVIDER_SITE_OTHER): Payer: Self-pay | Admitting: Internal Medicine

## 2010-06-09 ENCOUNTER — Encounter: Payer: Self-pay | Admitting: Internal Medicine

## 2010-06-09 DIAGNOSIS — E785 Hyperlipidemia, unspecified: Secondary | ICD-10-CM

## 2010-06-09 DIAGNOSIS — I10 Essential (primary) hypertension: Secondary | ICD-10-CM

## 2010-06-09 DIAGNOSIS — R059 Cough, unspecified: Secondary | ICD-10-CM

## 2010-06-09 DIAGNOSIS — R05 Cough: Secondary | ICD-10-CM

## 2010-06-09 NOTE — Patient Instructions (Addendum)
See calendar for specific medication instructions and bring it back for each and every office visit for every healthcare provider you see.  Without it,  you may not receive the best quality medical care that we feel you deserve.  You will note that the calendar groups together  your maintenance  medications that are timed at particular times of the day.  Think of this as your checklist for what your doctor has instructed you to do until your next evaluation to see what benefit  there is  to staying on a consistent group of medications intended to keep you well.  The other group at the bottom is entirely up to you to use as you see fit  for specific symptoms that may arise between visits that require you to treat them on an as needed basis.  Think of this as your action plan or "what if" list.   Separating the top medications from the bottom group is fundamental to providing you adequate care going forward.    Please schedule a follow up visit in 5  months but call sooner if needed for CPX on return  If you would like generics you should refer to your formulary or get your pharmacist to help you

## 2010-06-09 NOTE — Assessment & Plan Note (Signed)
Resolved on gerd rx    Each maintenance medication was reviewed in detail including most importantly the difference between maintenance and as needed and under what circumstances the prns are to be used.  Please see instructions for details which were reviewed in writing and the patient given a copy.  This was done in the context of a medication calendar review which provided the patient with a user-friendly unambiguous mechanism for medication administration and reconciliation and provides an action plan for all active problems. It is critical that this be shown to every doctor  for modification during the office visit if necessary so the patient can use it as a working document.

## 2010-06-09 NOTE — Assessment & Plan Note (Signed)
Ok on rx but wants generics.  Explained need to follow formulary but should be able to use amlodipine and metaprolol here as long as monitor bp

## 2010-06-09 NOTE — Progress Notes (Signed)
  Subjective:    Patient ID: Erika [name], female    DOB: Apr 22, 1945, 65 y.o.   MRN: [id number]  HPI 89 yowf never smoker with moderate obesity complicated by hyperlipidemia and hypertension and gerd with cough and atypical cp.  December 25, 2007 ov discouraged re wt loss, doing a "dial in" nutritionist per insurance.  No ex cp, tia or claudication symptoms.  No change in recs  January 13, 2010 ov cc new onset cp first develped 13 y ago attributed to gerd and resolved on treatment then recurred sporadically but much worse since [facility] september 2011 has had four episodes last 3 weeks ago, lasting typically 30 min, center of chest no radiating no nausea, sweat, sob. not reproducible with deep breathing or related to ex, usually occur at rest or lying down. Imp was gerd, rec ppi /diet > resolved  06/09/2010 ov f/u hbp wants to consider generic.  Cc  cp/ cough resolved on gerd rx.  Pt denies any significant sore throat, dysphagia, itching, sneezing,  nasal congestion or excess/ purulent secretions,  fever, chills, sweats, unintended wt loss, pleuritic or exertional cp, hempoptysis, orthopnea pnd or leg swelling.    Also denies any obvious fluctuation of symptoms with weather or environmental changes or other aggravating or alleviating factors.       Past Medical History:  DIVERTICULOSIS OF COLON (ICD-562.10)C. colonoscopy 04/14/99  GERD (ICD-530.81)  HYPERLIPIDEMIA (ICD-272.4)  Goal LDL < 130 HBP/Pos fm hx  HYPERTENSION (ICD-401.9)  OBESITY  - Target wt = 179 for BMI < 30 161 ideal  HEALTH MAINTENANCE............................................................Marland KitchenWert/[place]  - Calendar done 02/20/07, redone January 13, 2010  - Td 11/2006  - Pneumovax Jul 22, 2008  - CPX Jul 22, 2008            Review of Systems     Objective:   Physical Exam  wt 180 > 171 January 13, 2009 > 177 May 05, 2009 > 178 January 13, 2010 > Ambulatory healthy appearing in no acute distress.  Afeb  with normal vital signs  HEENT: nl dentition, turbinates, and orophanx. Nl external ear canals without cough reflex  Neck without JVD/Nodes/TM  Lungs clear to A and P bilaterally without cough on insp or exp maneuvers  RRR no s3 or murmur or increase in P2  Abd soft and benign with nl excursion in the supine position. No bruits or organomegaly  Ext warm without calf tenderness, cyanosis clubbing or edema  Skin warm and dry with inlfammed seb keratosis mid back        Assessment & Plan:

## 2010-07-19 NOTE — Assessment & Plan Note (Signed)
[facility]                             PULMONARY OFFICE NOTE   JERENE, YEAGER                      MRN:          [id number]  DATE:11/08/2006                            DOB:          1945/08/22    PRIMARY SERVICE COMPREHENSIVE HEALTH CARE EVALUATION:   HISTORY:  [age]-year-old white female with a history of hypertension and  hyperlipidemia, related to moderate weight excess, returns for  comprehensive health care evaluation, complaining of intermittent  nocturnal GERD, to the point where she actually can feel food coming up  in her throat, especially after she eats heavy [place] meals at supper.  She has already received a GERD diet, states she follows it as much as  she can and takes ranitidine one tablet at bedtime (strength not  specified) with intermittent break-throughs as above, but says it is not  no more than once or twice a week.  She denies any dysphagia, chronic  cough, fevers, chills, sweats, unintended weight-loss, orthopnea, PND or  leg-swelling.   She also is bothered by chronic tendinitis of the thumb and has been  using Aleve, including sometimes in the evening.   PAST MEDICAL HISTORY:  1. Hypertension.  2. Diverticulosis by colonoscopy, 2001.  3. Hyperlipidemia, target LDL less than 130 because of positive family      history and hypertension.  4. Right-thumb tendinitis.  Refer to Dr. Teressa Senter on October 10, 2006.  He      did not feel anything further could be done, per patient report.   ALLERGIES:  ZEGERID causes nausea, vomiting, diarrhea.   MEDICATIONS:  Taken in detail on the work sheet, dated November 08, 2006.  She does keep a card with her medicines all on it, but the list  is not complete, as it is missing strengths.   SOCIAL HISTORY:  She has never smoked.  Works as a Armed forces operational officer.  Denies significant alcohol use.   FAMILY HISTORY:  Positive for stroke in her mother, myocardial  infarction in her father at  age 61, and brother died of an aortic  aneurysm, presumably with hypertension.  There are no cancer or GI  disorders in her family, to her knowledge.   REVIEW OF SYSTEMS:  Taken in detail on the work sheet and negative,  except as outlined above.   PHYSICAL EXAMINATION:  This is a stoic, ambulatory, white female in no  acute distress.  She is afebrile, normal vital signs.  HEENT:  Reveals mild to moderate nonspecific turbinate edema.  Oropharynx was clear with intact dentition.  The ear canals are clear  bilaterally.  Limited ocular exam revealed no obvious retinal or  arterial change.  NECK:  Supple without cervical adenopathy or tenderness.  Trachea is  midline, no thyromegaly.  Carotid upstrokes are brisk without any  bruits.  CHEST:  Completely clear bilaterally to auscultation and percussion.  HEART:  Regular rate and rhythm without murmur, gallop or rub.  No  increase in P2 or displacement of PMI.  ABDOMEN:  Soft, benign, without palpable organomegaly, masses or  tenderness.  Femoral pulses were  present bilaterally, without bruits.  GENITOURINARY/RECTAL:  Per Dr. Marcelle Overlie.  EXTREMITIES:  Warm without calf tenderness, cyanosis, clubbing or edema.  Pedal pulses were present bilaterally in symmetric fashion.  NEUROLOGIC:  No focal deficits or pathologic reflexes.  SKIN:  Warm and dry, except for a few skin tags in the left axilla,  which appeared completely benign.   LABORATORY DATA:  CBC was normal.  Sed rate was 31.  BMET and CMET were  normal.  Total cholesterol was 266 with an HDL of 38, LDL pending.  CRP  1, TSH was normal.  Urinalysis was unremarkable.   EKG was normal.   IMPRESSION:  1. Reflux, probably exacerbated by the need to use Aleve for      tendinitis.  I did recommend, if she is going to use Aleve, to use      it with meals and have recommended she increase the ranitidine 100      mg b.i.d., but if she continues to have break-through symptoms,      then we  will need to refer her back to her GI physician of record      and/or consider alternative medication to Aleve, such as meloxicam.      Her GI physician of record, by the way, is Dr. Arlyce Dice.  2. Hyperlipidemia.  LDL is still pending, but needs to be less than      130, based on positive family history of heart disease plus the      fact the patient has hypertension.  3. Hypertension, well-controlled with no evidence of end-organ damage.  4. Health maintenance:  The patient was due tetanus today, which was      given.  5. Dr. Marcelle Overlie is following both her bone density and her mammograms,      as well as GYN care.   I would like to see the patient back here to review all the lab data,  but also generate a more user-friendly, unambiguous version of her  medicines, so that we can do full medication reconciliation.  If she is  still having GI symptoms, at that point, might consider switching from  Aleve to meloxicam.  To help maintenance issues, a vitamin D level is  pending.     Charlaine Dalton. Sherene Sires, MD, [facility]  Electronically Signed    MBW/MedQ  DD: 11/09/2006  DT: 11/09/2006  Job #: [id number]   cc:   Barbette Hair. Arlyce Dice, MD,FACG

## 2010-07-19 NOTE — Assessment & Plan Note (Signed)
[facility]                             PULMONARY OFFICE NOTE   Erika [name], Erika [name]                      MRN:          [id number]  DATE:08/22/2006                            DOB:          08/04/1945    HISTORY OF PRESENT ILLNESS:  A 65 year old white female with history of  hypertension and in for follow up complaining of right thumb pain for  which she has been using Aleve on an average of once daily.  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   MEDICATIONS:  Medication reviewed with the patient, and inventory in the  column date is August 22, 2006, correct as listed.   PHYSICAL EXAMINATION:  GENERAL:  She is a pleasant, mildly obese white  female in no acute distress.  VITAL SIGNS:  Blood pressure 160/80.  HEENT:  Unremarkable.  Pharynx is clear.  LUNGS:  Lung fields clear bilateral to auscultation and percussion.  HEART:  Regular rhythm without murmurs, gallops, rubs.  ABDOMEN:  Soft, benign.  EXTREMITIES:  Warm without calf tenderness, cyanosis, clubbing or edema.  Her right hand and thumb appear normal with negative Tinel's sign.  She  does have mild tenderness over the flexor surface of the thumb.   IMPRESSION:  1. Probable repetitive use tendinitis.  I recommend that she continue      to use Aleve on a p.r.n. basis but take it with meals.  I have      arranged for her to see Dr. Teressa Senter for possible injection and /or      physical therapy.  2. Hypertension.  It is not optimally controlled, perhaps because of      using Aleve.   I have recommended that she increase Sular, but note that she is  adequately beta blocked with pulse rate of 67 this morning after taking  her Toprol.   I have therefore recommended increasing Sular up to 10 mg daily and  following up here with a comprehensive health care evaluation within the  next three months.   She is going  to be [place].  I have  advised her on DVT precautions.     Charlaine Dalton. Sherene Sires, MD, [facility]  Electronically Signed    MBW/MedQ  DD: 08/22/2006  DT: 08/22/2006  Job #: [id number]

## 2010-07-19 NOTE — Assessment & Plan Note (Signed)
[facility]                             PULMONARY OFFICE NOTE   MINIE, ROADCAP                      MRN:          [id number]  DATE:11/15/2006                            DOB:          1946-01-03    HISTORY OF PRESENT ILLNESS:  The patient is a 65 year old white female  patient of Dr. Thurston Hole with a known history of hypertension,  hyperlipidemia, and gastroesophageal reflux, who presents today for a 1-  week followup.  The patient was recently in the office for a complete  physical exam and returns today for review of lab work and medication  review.  The patient has a history of hyperlipidemia with a target LDL  goal of less than 130 due to positive family history and hypertension.  Lab work revealed a total cholesterol of 266, LDL is still pending, HDL  was at 38, and CRP was at 1.0.  The patient reports she does exercise  and is quite active; however, continues to gain weight despite trying to  eat the right type of foods.  The patient has brought all of her  medications in today to review which are correct with our medication  list.   PAST MEDICAL HISTORY:  Reviewed.   CURRENT MEDICATIONS:  Reviewed.   PHYSICAL EXAMINATION:  The patient is a pleasant female in no acute  distress.  She is afebrile.  Blood pressure recheck is 150/70, O2 saturation is 99%  on room air.  HEENT:  Unremarkable.  NECK:  Supple without cervical adenopathy, no JVD.  Carotids are equal  with positive upstrokes bilaterally without any bruits.  Lung sounds are clear.  CARDIAC:  Regular rate.  ABDOMEN:  Soft and nontender.  EXTREMITIES:  Warm without any edema.   DATA:  Vitamin D level is 43, total cholesterol 266, triglycerides 124,  HDL 38, LDL pending, TSH normal, CRP 1.0, hemoglobin 13.9, sed rate 31,  potassium 3.9, BUN and creatinine 6 and 0.7 respectively.   IMPRESSION/PLAN:  1. Hypertension:  Slightly elevated.  The patient's Jesusita Oka was recently  increased from 10 to 20 mg.  The patient will continue on her      present regimen, follow back up here in 6-8 weeks.  The patient is      encouraged on dietary and exercise measures.  2. Hyperlipidemia with a target LDL goal of less than 130 due to      positive family history and hypertension, risk factors.  The      patient, once again, is advised on dietary measures, exercise, and      weight loss.  The patient has agreed that she will aggressively try      to manage her diet and weight over the next 3 months.  She will      return here for followup fasting labs.  If, at that time, she is      not at goal, will consider starting statin therapy.  3. Obesity with a target goal weight of 156.  The patient is advised      on dietary and exercise measures.  4. Complex medication regimen.  The patient's medications are reviewed      in detail.  Patient      education was provided.  A computerized medication calendar was      completed for this patient and reviewed in detail.      Rubye Oaks, NP  Electronically Signed      Charlaine Dalton. Sherene Sires, MD, [facility]  Electronically Signed   TP/MedQ  DD: 11/16/2006  DT: 11/17/2006  Job #: [id number]

## 2010-11-16 ENCOUNTER — Ambulatory Visit (INDEPENDENT_AMBULATORY_CARE_PROVIDER_SITE_OTHER): Payer: BC Managed Care – PPO | Admitting: Internal Medicine

## 2010-11-16 ENCOUNTER — Other Ambulatory Visit: Payer: Self-pay | Admitting: Internal Medicine

## 2010-11-16 ENCOUNTER — Other Ambulatory Visit (INDEPENDENT_AMBULATORY_CARE_PROVIDER_SITE_OTHER): Payer: BC Managed Care – PPO

## 2010-11-16 ENCOUNTER — Encounter: Payer: Self-pay | Admitting: Internal Medicine

## 2010-11-16 DIAGNOSIS — I10 Essential (primary) hypertension: Secondary | ICD-10-CM

## 2010-11-16 DIAGNOSIS — E785 Hyperlipidemia, unspecified: Secondary | ICD-10-CM

## 2010-11-16 LAB — URINALYSIS, ROUTINE W REFLEX MICROSCOPIC
Bilirubin Urine: NEGATIVE
Ketones, ur: NEGATIVE
Specific Gravity, Urine: 1.01 (ref 1.000–1.030)
Urobilinogen, UA: 0.2 (ref 0.0–1.0)

## 2010-11-16 LAB — LIPID PANEL
HDL: 39.7 mg/dL (ref >39.00)
VLDL: 39.8 mg/dL (ref 0.0–40.0)

## 2010-11-16 LAB — BASIC METABOLIC PANEL
Calcium: 9.2 mg/dL (ref 8.4–10.5)
GFR: 69.49 mL/min (ref >60.00)
Glucose, Bld: 97 mg/dL (ref 70–99)
Sodium: 141 mEq/L (ref 135–145)

## 2010-11-16 NOTE — Progress Notes (Signed)
  Subjective:    Patient ID: Erika [name], female    DOB: Sep 04, 1945, 65 y.o.   MRN: [id number]  HPI 52 yowf never smoker with moderate obesity complicated by hyperlipidemia and hypertension and gerd with cough and atypical cp.  December 25, 2007 ov discouraged re wt loss, doing a "dial in" nutritionist per insurance.  No ex cp, tia or claudication symptoms.  No change in recs  January 13, 2010 ov cc new onset cp first develped 55 y ago attributed to gerd and resolved on treatment then recurred sporadically but much worse since [facility] september 2011 has had four episodes last 3 weeks ago, lasting typically 30 min, center of chest no radiating no nausea, sweat, sob. not reproducible with deep breathing or related to ex, usually occur at rest or lying down. Imp was gerd, rec ppi /diet > resolved  06/09/2010 ov f/u hbp wants to consider generic.  Cc  cp/ cough resolved on gerd rx.  Follow calendar.     11/16/2010 f/u ov/Erika [name] no med calendar but doing well needs chol check - no tia or claudication hx.   Pt denies any significant sore throat, dysphagia, itching, sneezing,  nasal congestion or excess/ purulent secretions,  fever, chills, sweats, unintended wt loss, pleuritic or exertional cp, hempoptysis, orthopnea pnd or leg swelling.    Also denies any obvious fluctuation of symptoms with weather or environmental changes or other aggravating or alleviating factors.       Past Medical History:  DIVERTICULOSIS OF COLON (ICD-562.10)C. colonoscopy 04/14/99  GERD (ICD-530.81)  HYPERLIPIDEMIA (ICD-272.4)  Goal LDL < 130 HBP/Pos fm hx  HYPERTENSION (ICD-401.9)  OBESITY  - Target wt = 179 for BMI < 30 161 ideal  HEALTH MAINTENANCE............................................................[place]ert/[place]  - Calendar done 02/20/07, redone January 13, 2010  - Td 11/2006  - Pneumovax Jul 22, 2008  - CPX Jul 22, 2008            Review of Systems     Objective:   Physical Exam  wt  171  January 13, 2009 > 177 May 05, 2009 > 178 January 13, 2010 > 11/16/2010  180 Ambulatory healthy appearing in no acute distress.  Afeb with normal vital signs  HEENT: nl dentition, turbinates, and orophanx. Nl external ear canals without cough reflex  Neck without JVD/Nodes/TM  Lungs clear to A and P bilaterally without cough on insp or exp maneuvers  RRR no s3 or murmur or increase in P2  Abd soft and benign with nl excursion in the supine position. No bruits or organomegaly  Ext warm without calf tenderness, cyanosis clubbing or edema  Skin warm and dry with inlfammed seb keratosis mid back        Assessment & Plan:

## 2010-11-16 NOTE — Patient Instructions (Addendum)
See calendar for specific medication instructions and bring it back for each and every office visit for every healthcare provider you see.  Without it,  you may not receive the best quality medical care that we feel you deserve.  You will note that the calendar groups together  your maintenance  medications that are timed at particular times of the day.  Think of this as your checklist for what your doctor has instructed you to do until your next evaluation to see what benefit  there is  to staying on a consistent group of medications intended to keep you well.  The other group at the bottom is entirely up to you to use as you see fit  for specific symptoms that may arise between visits that require you to treat them on an as needed basis.  Think of this as your action plan or "what if" list.   Separating the top medications from the bottom group is fundamental to providing you adequate care going forward.     Please schedule a follow up visit in 3 months but call sooner if needed   CPX on return

## 2010-11-17 ENCOUNTER — Ambulatory Visit: Payer: BC Managed Care – PPO | Admitting: Emergency Medicine

## 2010-11-17 ENCOUNTER — Encounter: Payer: Self-pay | Admitting: Internal Medicine

## 2010-11-18 NOTE — Progress Notes (Signed)
Quick Note:  Spoke with pt and notified of results per Dr. Wert. Pt verbalized understanding and denied any questions.  ______ 

## 2010-11-19 ENCOUNTER — Encounter: Payer: Self-pay | Admitting: Internal Medicine

## 2010-11-19 NOTE — Assessment & Plan Note (Signed)
Adequate control on present rx, reviewed  

## 2011-01-18 ENCOUNTER — Other Ambulatory Visit: Payer: Self-pay | Admitting: Internal Medicine

## 2011-03-17 ENCOUNTER — Other Ambulatory Visit: Payer: Self-pay | Admitting: Internal Medicine

## 2011-03-22 ENCOUNTER — Other Ambulatory Visit: Payer: Self-pay | Admitting: Internal Medicine

## 2011-03-29 ENCOUNTER — Encounter: Payer: Self-pay | Admitting: Family Medicine

## 2011-03-29 ENCOUNTER — Ambulatory Visit (INDEPENDENT_AMBULATORY_CARE_PROVIDER_SITE_OTHER): Payer: Medicare Other | Admitting: Family Medicine

## 2011-03-29 DIAGNOSIS — J310 Chronic rhinitis: Secondary | ICD-10-CM

## 2011-03-29 DIAGNOSIS — Z0000 Encounter for general adult medical examination without abnormal findings: Secondary | ICD-10-CM

## 2011-03-29 DIAGNOSIS — M1990 Unspecified osteoarthritis, unspecified site: Secondary | ICD-10-CM

## 2011-03-29 DIAGNOSIS — Z87898 Personal history of other specified conditions: Secondary | ICD-10-CM | POA: Insufficient documentation

## 2011-03-29 DIAGNOSIS — K219 Gastro-esophageal reflux disease without esophagitis: Secondary | ICD-10-CM

## 2011-03-29 DIAGNOSIS — E785 Hyperlipidemia, unspecified: Secondary | ICD-10-CM

## 2011-03-29 DIAGNOSIS — M949 Disorder of cartilage, unspecified: Secondary | ICD-10-CM

## 2011-03-29 DIAGNOSIS — N3281 Overactive bladder: Secondary | ICD-10-CM

## 2011-03-29 DIAGNOSIS — I10 Essential (primary) hypertension: Secondary | ICD-10-CM

## 2011-03-29 DIAGNOSIS — E663 Overweight: Secondary | ICD-10-CM | POA: Insufficient documentation

## 2011-03-29 DIAGNOSIS — J40 Bronchitis, not specified as acute or chronic: Secondary | ICD-10-CM

## 2011-03-29 DIAGNOSIS — K5730 Diverticulosis of large intestine without perforation or abscess without bleeding: Secondary | ICD-10-CM

## 2011-03-29 DIAGNOSIS — M899 Disorder of bone, unspecified: Secondary | ICD-10-CM

## 2011-03-29 DIAGNOSIS — M129 Arthropathy, unspecified: Secondary | ICD-10-CM

## 2011-03-29 DIAGNOSIS — E669 Obesity, unspecified: Secondary | ICD-10-CM

## 2011-03-29 DIAGNOSIS — M8580 Other specified disorders of bone density and structure, unspecified site: Secondary | ICD-10-CM

## 2011-03-29 DIAGNOSIS — N318 Other neuromuscular dysfunction of bladder: Secondary | ICD-10-CM

## 2011-03-29 DIAGNOSIS — J329 Chronic sinusitis, unspecified: Secondary | ICD-10-CM

## 2011-03-29 DIAGNOSIS — K589 Irritable bowel syndrome without diarrhea: Secondary | ICD-10-CM

## 2011-03-29 LAB — LDL CHOLESTEROL, DIRECT: Direct LDL: 182.9 mg/dL

## 2011-03-29 LAB — RENAL FUNCTION PANEL
Albumin: 4.2 g/dL (ref 3.5–5.2)
CO2: 30 mEq/L (ref 19–32)
Calcium: 9.2 mg/dL (ref 8.4–10.5)
Glucose, Bld: 84 mg/dL (ref 70–99)
Potassium: 4 mEq/L (ref 3.5–5.1)

## 2011-03-29 LAB — HEPATIC FUNCTION PANEL
ALT: 22 U/L (ref 0–35)
AST: 22 U/L (ref 0–37)
Bilirubin, Direct: 0 mg/dL (ref 0.0–0.3)
Total Protein: 7.8 g/dL (ref 6.0–8.3)

## 2011-03-29 LAB — LIPID PANEL
Cholesterol: 252 mg/dL — ABNORMAL HIGH (ref 0–200)
Total CHOL/HDL Ratio: 7
VLDL: 37.6 mg/dL (ref 0.0–40.0)

## 2011-03-29 LAB — CBC
HCT: 43 % (ref 36.0–46.0)
MCV: 91.8 fl (ref 78.0–100.0)
Platelets: 249 10*3/uL (ref 150.0–400.0)
RBC: 4.68 Mil/uL (ref 3.87–5.11)
WBC: 6.9 10*3/uL (ref 4.5–10.5)

## 2011-03-29 MED ORDER — CEFDINIR 300 MG PO CAPS: 300.0000 mg | ORAL_CAPSULE | Freq: Two times a day (BID) | ORAL | Status: AC

## 2011-03-29 NOTE — Patient Instructions (Signed)
Preventative Care for Adults, Female A healthy lifestyle and preventative care can promote health and wellness. Preventative health guidelines for women include the following key practices:  A routine yearly physical is a good way to check with your caregiver about your health and preventative screening. It is a chance to share any concerns and updates on your health, and to receive a thorough exam.   Visit your dentist for a routine exam and preventative care every 6 months. Brush your teeth twice a day and floss once a day. Good oral hygiene prevents tooth decay and gum disease.   The frequency of eye exams is based on your age, health, family medical history, use of contact lenses, and other factors. Follow your caregiver's recommendations for frequency of eye exams.   Eat a healthy diet. Foods like vegetables, fruits, whole grains, low-fat dairy products, and lean protein foods contain the nutrients you need without too many calories. Decrease your intake of foods high in solid fats, added sugars, and salt. Eat the right amount of calories for you.Get information about a proper diet from your caregiver, if necessary.   Regular physical exercise is one of the most important things you can do for your health. Most adults should get at least 150 minutes of moderate-intensity exercise (any activity that increases your heart rate and causes you to sweat) each week. In addition, most adults need muscle-strengthening exercises on 2 or more days a week.   Maintain a healthy weight. The body mass index (BMI) is a screening tool to identify possible weight problems. It provides an estimate of body fat based on height and weight. Your caregiver can help determine your BMI, and can help you achieve or maintain a healthy weight.For adults 20 years and older:   A BMI below 18.5 is considered underweight.   A BMI of 18.5 to 24.9 is normal.    Start MegaRed caps daily for arthritis and cholesterol Starst  Tylenol/Acetaminophen ES 500mg  2 tabs po twice daily for arthritis 64 oz of clear fluids Start Citracal Petites twice a day for bones Aspercreme for arthritis pain Avoid Sudafed/Pseudoephedrine/D and Dextromethorphan/DM  Use Mucinex twice daily and nasal saline and Benadryl at bed for congestion  A BMI of 25 to 29.9 is considered overweight.   A BMI of 30 and above is considered obese.   Maintain normal blood lipids and cholesterol levels by exercising and minimizing your intake of saturated fat. Eat a balanced diet with plenty of fruit and vegetables. Blood tests for lipids and cholesterol should begin at age 7 and be repeated every 5 years. If your lipid or cholesterol levels are high, you are over 50, or you are a high risk for heart disease, you may need your cholesterol levels checked more frequently.Ongoing high lipid and cholesterol levels should be treated with medicines if diet and exercise are not effective.   If you smoke, find out from your caregiver how to quit. If you do not use tobacco, do not start.   If you are pregnant, do not drink alcohol. If you are breastfeeding, be very cautious about drinking alcohol. If you are not pregnant and choose to drink alcohol, do not exceed 1 drink per day. One drink is considered to be 12 ounces (355 mL) of beer, 5 ounces (148 mL) of wine, or 1.5 ounces (44 mL) of liquor.   Avoid use of street drugs. Do not share needles with anyone. Ask for help if you need support or instructions about stopping  the use of drugs.   High blood pressure causes heart disease and increases the risk of stroke. Your blood pressure should be checked at least every 1 to 2 years. Ongoing high blood pressure should be treated with medicines if weight loss and exercise are not effective.   If you are 48 to 66 years old, ask your caregiver if you should take aspirin to prevent strokes.   Diabetes screening involves taking a blood sample to check your fasting blood  sugar level. This should be done once every 3 years, after age 109, if you are within normal weight and without risk factors for diabetes. Testing should be considered at a younger age or be carried out more frequently if you are overweight and have at least 1 risk factor for diabetes.   Breast cancer screening is essential preventative care for women. You should practice "breast self-awareness." This means understanding the normal appearance and feel of your breasts and may include breast self-examination. Any changes detected, no matter how small, should be reported to a caregiver. Women in their 40s and 30s should have a clinical breast exam (CBE) by a caregiver as part of a regular health exam every 1 to 3 years. After age 46, women should have a CBE every year. Starting at age 84, women should consider having a mammogram (breast X-ray) every year. Women who have a family history of breast cancer should talk to their caregiver about genetic screening. Women at a high risk of breast cancer should talk to their caregiver about having an MRI and a mammogram every year.   The Pap test is a screening test for cervical cancer. A Pap test can show cell changes on the cervix that might become cervical cancer if left untreated. A Pap test is a procedure in which cells are obtained and examined from the lower end of the uterus (cervix).   Women should have a Pap test starting at age 4.   Between ages 29 and 54, Pap tests should be repeated every 2 years.   Beginning at age 62, you should have a Pap test every 3 years as long as the past 3 Pap tests have been normal.   Some women have medical problems that increase the chance of getting cervical cancer. Talk to your caregiver about these problems. It is especially important to talk to your caregiver if a new problem develops soon after your last Pap test. In these cases, your caregiver may recommend more frequent screening and Pap tests.   The above  recommendations are the same for women who have or have not gotten the vaccine for human papillomavirus (HPV).   If you had a hysterectomy for a problem that was not cancer or a condition that could lead to cancer, then you no longer need Pap tests. Even if you no longer need a Pap test, a regular exam is a good idea to make sure no other problems are starting.   If you are between ages 53 and 46, and you have had normal Pap tests going back 10 years, you no longer need Pap tests. Even if you no longer need a Pap test, a regular exam is a good idea to make sure no other problems are starting.   If you have had past treatment for cervical cancer or a condition that could lead to cancer, you need Pap tests and screening for cancer for at least 20 years after your treatment.   If Pap tests have been  discontinued, risk factors (such as a new sexual partner) need to be reassessed to determine if screening should be resumed.   The HPV test is an additional test that may be used for cervical cancer screening. The HPV test looks for the virus that can cause the cell changes on the cervix. The cells collected during the Pap test can be tested for HPV. The HPV test could be used to screen women aged 23 years and older, and should be used in women of any age who have unclear Pap test results. After the age of 31, women should have HPV testing at the same frequency as a Pap test.   Colorectal cancer can be detected and often prevented. Most routine colorectal cancer screening begins at the age of 73 and continues through age 42. However, your caregiver may recommend screening at an earlier age if you have risk factors for colon cancer. On a yearly basis, your caregiver may provide home test kits to check for hidden blood in the stool. Use of a small camera at the end of a tube, to directly examine the colon (sigmoidoscopy or colonoscopy), can detect the earliest forms of colorectal cancer. Talk to your caregiver  about this at age 34, when routine screening begins. Direct examination of the colon should be repeated every 5 to 10 years through age 90, unless early forms of pre-cancerous polyps or small growths are found.   Practice safe sex. Use condoms and avoid high-risk sexual practices to reduce the spread of sexually transmitted infections (STIs). STIs include gonorrhea, chlamydia, syphilis, trichomonas, herpes, HPV, and human immunodeficiency virus (HIV). Herpes, HIV, and HPV are viral illnesses that have no cure. They can result in disability, cancer, and death. Sexually active women aged 42 and younger should be checked for Chlamydia. Older women with new or multiple partners should also be tested for Chlamydia. Testing for other STIs is recommended if you are sexually active and at increased risk.   Osteoporosis is a disease in which the bones lose minerals and strength with aging. This can result in serious bone fractures. The risk of osteoporosis can be identified using a bone density scan. Women ages 79 and over and women at risk for fractures or osteoporosis should discuss screening with their caregivers. Ask your caregiver whether you should take a calcium supplement or vitamin D to reduce the rate of osteoporosis.   Menopause can be associated with physical symptoms and risks. Hormone replacement therapy is available to decrease symptoms and risks. You should talk to your caregiver about whether hormone replacement therapy is right for you.   Use sunscreen with skin protection factor (SPF) of 30 or more. Apply sunscreen liberally and repeatedly throughout the day. You should seek shade when your shadow is shorter than you. Protect yourself by wearing long sleeves, pants, a wide-brimmed hat, and sunglasses year round, whenever you are outdoors.   Once a month, do a whole body skin exam, using a mirror to look at the skin on your back. Notify your caregiver of new moles, moles that have irregular  borders, moles that are larger than a pencil eraser, or moles that have changed in shape or color.   Stay current with required immunizations.   Influenza. You need a dose every fall (or winter). The composition of the flu vaccine changes each year, so being vaccinated once is not enough.   Pneumococcal polysaccharide. You need 1 to 2 doses if you smoke cigarettes or if you have certain chronic  medical conditions. You need 1 dose at age 40 (or older) if you have never been vaccinated.   Tetanus, diphtheria, pertussis (Tdap, Td). Get 1 dose of Tdap vaccine if you are younger than age 58 years, are over 36 and have contact with an infant, are a Research scientist (physical sciences), are pregnant, or simply want to be protected from whooping cough. After that, you need a Td booster dose every 10 years. Consult your caregiver if you have not had at least 3 tetanus and diphtheria-containing shots sometime in your life or have a deep or dirty wound.   HPV. You need this vaccine if you are a woman age 10 years or younger. The vaccine is given in 3 doses over 6 months.   Measles, mumps, rubella (MMR). You need at least 1 dose of MMR if you were born in 1957 or later. You may also need a 2nd dose.   Meningococcal. If you are age 67 to 95 years and a Orthoptist living in a residence hall, or have one of several medical conditions, you need to get vaccinated against meningococcal disease. You may also need additional booster doses.   Zoster (shingles). If you are age 52 years or older, you should get this vaccine.   Varicella (chickenpox). If you have never had chickenpox or you were vaccinated but received only 1 dose, talk to your caregiver to find out if you need this vaccine.   Hepatitis A. You need this vaccine if you have a specific risk factor for hepatitis A virus infection or you simply wish to be protected from this disease. The vaccine is usually given as 2 doses, 6 to 18 months apart.   Hepatitis  B. You need this vaccine if you have a specific risk factor for hepatitis B virus infection or you simply wish to be protected from this disease. The vaccine is given in 3 doses, usually over 6 months.  Preventative Services / Frequency Ages 51 to 53  Blood pressure check.** / Every 1 to 2 years.   Lipid and cholesterol check.**/ Every 5 years beginning at age 42.   Clinical breast exam.** / Every 3 years for women in their 69s and 30s.   Pap Test.** / Every 2 years from ages 42 through 75. Every 3 years starting at age 17 years through age 45 or 10 with a history of 3 consecutive normal Pap tests.   HPV Screening.** / Every 3 years from ages 69 through ages 3 to 32 with a history of 3 consecutive normal Pap tests.   Skin self-exam. / Monthly.   Influenza immunization.** / Every year.   Pneumococcal polysaccharide immunization.** / 1 to 2 doses if you smoke cigarettes or if you have certain chronic medical conditions.   Tetanus, diphtheria, pertussis (Tdap,Td) immunization. / A one-time dose of Tdap vaccine. After that, you need a Td booster dose every 10 years.   HPV immunization. / 3 doses over 6 months, if 26 and younger.   Measles, mumps, rubella (MMR) immunization. / You need at least 1 dose of MMR if you were born in 1957 or later. You may also need a 2nd dose.   Meningococcal immunization. / 1 dose if you are age 96 to 38 years and a Orthoptist living in a residence hall, or have one of several medical conditions, you need to get vaccinated against meningococcal disease. You may also need additional booster doses.   Varicella immunization. **/ Consult your caregiver.  Hepatitis A immunization. ** / Consult your caregiver. 2 doses, 6 to 18 months apart.   Hepatitis B immunization.** / Consult your caregiver. 3 doses usually over 6 months.  Ages 49 to 44  Blood pressure check.** / Every 1 to 2 years.   Lipid and cholesterol check.**/ Every 5 years beginning  at age 41.   Clinical breast exam.** / Every year after age 13.   Mammogram.** / Every year beginning at age 68 and continuing for as long as you are in good health. Consult with your caregiver.   Pap Test.** / Every 3 years starting at age 80 years through age 61 or 60 with a history of 3 consecutive normal Pap tests.   HPV Screening.** / Every 3 years from ages 69 through ages 25 to 16 with a history of 3 consecutive normal Pap tests.   Fecal occult blood test (FOBT) of stool. / Every year beginning at age 58 and continuing until age 93. You may not have to do this test if you get colonoscopy every 10 years.   Flexible sigmoidoscopy** or colonoscopy.** / Every 5 years for a flexible sigmoidoscopy or every 10 years for a colonoscopy beginning at age 41 and continuing until age 85.   Skin self-exam. / Monthly.   Influenza immunization.** / Every year.   Pneumococcal polysaccharide immunization.** / 1 to 2 doses if you smoke cigarettes or if you have certain chronic medical conditions.   Tetanus, diphtheria, pertussis (Tdap/Td) immunization.** / A one-time dose of Tdap vaccine. After that, you need a Td booster dose every 10 years.   Measles, mumps, rubella (MMR) immunization. / You need at least 1 dose of MMR if you were born in 1957 or later. You may also need a 2nd dose.   Varicella immunization. **/ Consult your caregiver.   Meningococcal immunization.** / Consult your caregiver.     Hepatitis A immunization. ** / Consult your caregiver. 2 doses, 6 to 18 months apart.   Hepatitis B immunization.** / Consult your caregiver. 3 doses, usually over 6 months.  Ages 54 and over  Blood pressure check.** / Every 1 to 2 years.   Lipid and cholesterol check.**/ Every 5 years beginning at age 39.   Clinical breast exam.** / Every year after age 90.   Mammogram.** / Every year beginning at age 66 and continuing for as long as you are in good health. Consult with your caregiver.    Pap Test,** / Every 3 years starting at age 67 years through age 31 or 25 with a 3 consecutive normal Pap tests. Testing can be stopped between 65 and 70 with 3 consecutive normal Pap tests and no abnormal Pap or HPV tests in the past 10 years.   HPV Screening.** / Every 3 years from ages 56 through ages 13 or 65 with a history of 3 consecutive normal Pap tests. Testing can be stopped between 65 and 70 with 3 consecutive normal Pap tests and no abnormal Pap or HPV tests in the past 10 years.   Fecal occult blood test (FOBT) of stool. / Every year beginning at age 68 and continuing until age 31. You may not have to do this test if you get colonoscopy every 10 years.   Flexible sigmoidoscopy** or colonoscopy.** / Every 5 years for a flexible sigmoidoscopy or every 10 years for a colonoscopy beginning at age 88 and continuing until age 53.   Osteoporosis screening.** / A one-time screening for women ages 3 and  over and women at risk for fractures or osteoporosis.   Skin self-exam. / Monthly.   Influenza immunization.** / Every year.   Pneumococcal polysaccharide immunization.** / 1 dose at age 34 (or older) if you have never been vaccinated.   Tetanus, diphtheria, pertussis (Tdap, Td) immunization. / A one-time dose of Tdap vaccine if you are over 65 and have contact with an infant, are a Research scientist (physical sciences), or simply want to be protected from whooping cough. After that, you need a Td booster dose every 10 years.   Varicella immunization. **/ Consult your caregiver.   Meningococcal immunization.** / Consult your caregiver.   Hepatitis A immunization. ** / Consult your caregiver. 2 doses, 6 to 18 months apart.   Hepatitis B immunization.** / Check with your caregiver. 3 doses, usually over 6 months.  ** Family history and personal history of risk and conditions may change your caregiver's recommendations. Document Released: 04/18/2001 Document Revised: 11/02/2010 Document Reviewed:  07/18/2010 [facility] Patient Information 2012 [place], [place].

## 2011-03-30 NOTE — Progress Notes (Signed)
Shine Scrogham [id number] Mar 29, 1945 03/30/2011      Progress Note New Patient  Subjective  Chief Complaint  Chief Complaint  Patient presents with  . Establish Care    medicare-new patient  . head cold    symptoms started yesterday    HPI  This is a 66 year old Caucasian female and has been struggling with upper respiratory infection symptoms over the last days. Has been taking Tylenol Cold and sinus. She has congestion, malaise, headache, cough. Denies chest pain, palpitations, shortness of breath, GI or GU complaints. Has chronic arthritis in her hands or weakness in her thumb is stiff in the morning and after rest. No swelling or redness. No fevers or chills. She follows with Dr. Marcelle Overlie of tingling in chest or bowel symptoms at times with loose stools at times. No blood in her stool.  Past Medical History  Diagnosis Date  . Diverticulosis of colon     colonoscopy 04/14/1999  . Obesity   . Chicken pox as a child  . Measles as a child  . Mumps as a child  . GERD (gastroesophageal reflux disease)   . Hyperlipidemia   . Hypertension   . Osteopenia   . History of fibrocystic disease of breast     Past Surgical History  Procedure Date  . Breast reduction surgery 1986  . Bunion repair 1980    on both feet    Family History  Problem Relation Age of Onset  . Stroke Mother 41  . Hyperlipidemia Mother   . Hypertension Mother   . Heart attack Father 11  . Hypertension Father   . Heart disease Father   . Hypertension Brother   . Aneurysm Brother     aortic  . Hyperlipidemia Brother   . Heart disease Brother     aortic aneurysm rupture  . Multiple sclerosis Daughter   . Alcohol abuse Son   . Schizophrenia Son   . Bipolar disorder Son   . Mental illness Son     schizophrenic, bipolar    History   Social History  . Marital Status: Married    Spouse Name: N/A    Number of Children: N/A  . Years of Education: N/A   Occupational History  . Not on file.    Social History Main Topics  . Smoking status: Never Smoker   . Smokeless tobacco: Never Used  . Alcohol Use: No  . Drug Use: No  . Sexually Active: Yes   Other Topics Concern  . Not on file   Social History Narrative  . No narrative on file    Current Outpatient Prescriptions on File Prior to Visit  Medication Sig Dispense Refill  . aspirin 81 MG tablet Take 81 mg by mouth daily.        Marland Kitchen BYSTOLIC 10 MG tablet TAKE 1 TABLET EVERY DAY  34 tablet  0  . calcium carbonate (TUMS) 500 MG chewable tablet as needed.        . cetirizine (ZYRTEC) 10 MG tablet Take 10 mg by mouth at bedtime as needed.        . chlorpheniramine (CHLOR-TRIMETON) 4 MG tablet Take 4 mg by mouth daily as needed.        . diphenhydramine-acetaminophen (TYLENOL PM) 25-500 MG TABS Take 1 tablet by mouth at bedtime as needed.        . Multiple Minerals-Vitamins (CITRACAL PLUS PO) Take 1 tablet by mouth daily.        . Multiple Vitamin (  MULTIVITAMIN) capsule Take 1 capsule by mouth daily.        . nisoldipine (SULAR) 34 MG 24 hr tablet TAKE 1 TABLET BY MOUTH EVERY MORNING  30 tablet  0  . omeprazole (PRILOSEC) 20 MG capsule Take 20 mg by mouth daily.        . trospium (SANCTURA) 20 MG tablet Take 20 mg by mouth daily.        Marland Kitchen dextromethorphan-guaiFENesin (MUCINEX DM) 30-600 MG per 12 hr tablet Take 1 to 2 tabs every 12 hours as needed         No Known Allergies  Review of Systems  Review of Systems  Constitutional: Negative for fever, chills and malaise/fatigue.  HENT: Positive for congestion. Negative for hearing loss and nosebleeds.   Eyes: Negative for discharge.  Respiratory: Positive for cough and sputum production. Negative for shortness of breath and wheezing.   Cardiovascular: Negative for chest pain, palpitations and leg swelling.  Gastrointestinal: Negative for heartburn, nausea, vomiting, abdominal pain, diarrhea, constipation and blood in stool.  Genitourinary: Negative for dysuria, urgency,  frequency and hematuria.  Musculoskeletal: Negative for myalgias, back pain and falls.  Skin: Negative for rash.  Neurological: Negative for dizziness, tremors, sensory change, focal weakness, loss of consciousness, weakness and headaches.  Endo/Heme/Allergies: Negative for polydipsia. Does not bruise/bleed easily.  Psychiatric/Behavioral: Negative for depression and suicidal ideas. The patient is not nervous/anxious and does not have insomnia.     Objective  BP 182/80  Pulse 59  Temp(Src) 99 F (37.2 C) (Temporal)  Ht 5\' 6"  (1.676 m)  Wt 179 lb (81.194 kg)  BMI 28.89 kg/m2  SpO2 98%  Physical Exam  Physical Exam  Constitutional: She is oriented to person, place, and time and well-developed, well-nourished, and in no distress. No distress.  HENT:  Head: Normocephalic and atraumatic.  Right Ear: External ear normal.  Left Ear: External ear normal.  Nose: Nose normal.  Mouth/Throat: Oropharynx is clear and moist. No oropharyngeal exudate.  Eyes: Conjunctivae are normal. Pupils are equal, round, and reactive to light. Right eye exhibits no discharge. Left eye exhibits no discharge. No scleral icterus.  Neck: Normal range of motion. Neck supple. No thyromegaly present.  Cardiovascular: Normal rate, regular rhythm, normal heart sounds and intact distal pulses.   No murmur heard. Pulmonary/Chest: Effort normal and breath sounds normal. No respiratory distress. She has no wheezes. She has no rales.  Abdominal: Soft. Bowel sounds are normal. She exhibits no distension and no mass. There is no tenderness.  Musculoskeletal: Normal range of motion. She exhibits no edema and no tenderness.  Lymphadenopathy:    She has no cervical adenopathy.  Neurological: She is alert and oriented to person, place, and time. She has normal reflexes. No cranial nerve deficit. Coordination normal.  Skin: Skin is warm and dry. No rash noted. She is not diaphoretic.  Psychiatric: Mood, memory and affect  normal.       Assessment & Plan   Hypertension Patient has been taking OTC decongestants and has not taken her BP meds today. Will continue Sular and she has recently been switched to Bystolic but was hesitant to start it due to cost. Is given samples today.  HYPERLIPIDEMIA Needs fastin labs, avoid trans fats, start MegaRed caps daily  Bronchitis Cefdinir, Mucinex, push clear fluids and increase rest  GERD Patient reports good control on Omeprazole. Avoid offending foods  CHRONIC RHINITIS Using Fexofenadine  Osteopenia Taking calcium with Vitamin D daily, encouraged calcium citrate.  Arthritis  Add Megared oil caps daily, try Aspercreme prn  Overactive bladder Good response to [employer]  IBS (irritable bowel syndrome) Add a probiotic and a fiber supplement daily  Obesity Encouraged the DASH diet given a handout

## 2011-04-02 ENCOUNTER — Encounter: Payer: Self-pay | Admitting: Family Medicine

## 2011-04-02 DIAGNOSIS — J40 Bronchitis, not specified as acute or chronic: Secondary | ICD-10-CM

## 2011-04-02 DIAGNOSIS — K589 Irritable bowel syndrome without diarrhea: Secondary | ICD-10-CM | POA: Insufficient documentation

## 2011-04-02 DIAGNOSIS — N3281 Overactive bladder: Secondary | ICD-10-CM | POA: Insufficient documentation

## 2011-04-02 DIAGNOSIS — M1990 Unspecified osteoarthritis, unspecified site: Secondary | ICD-10-CM

## 2011-04-02 HISTORY — DX: Overactive bladder: N32.81

## 2011-04-02 HISTORY — DX: Unspecified osteoarthritis, unspecified site: M19.90

## 2011-04-02 HISTORY — DX: Irritable bowel syndrome, unspecified: K58.9

## 2011-04-02 HISTORY — DX: Bronchitis, not specified as acute or chronic: J40

## 2011-04-02 NOTE — Assessment & Plan Note (Signed)
Using Fexofenadine

## 2011-04-02 NOTE — Assessment & Plan Note (Signed)
Add a probiotic and a fiber supplement daily

## 2011-04-02 NOTE — Assessment & Plan Note (Signed)
Taking calcium with Vitamin D daily, encouraged calcium citrate.

## 2011-04-02 NOTE — Assessment & Plan Note (Signed)
Cefdinir, Mucinex, push clear fluids and increase rest

## 2011-04-02 NOTE — Assessment & Plan Note (Signed)
Patient reports good control on Omeprazole. Avoid offending foods

## 2011-04-02 NOTE — Assessment & Plan Note (Signed)
Good response to [employer]

## 2011-04-02 NOTE — Assessment & Plan Note (Signed)
Needs fastin labs, avoid trans fats, start MegaRed caps daily

## 2011-04-02 NOTE — Assessment & Plan Note (Signed)
Add Megared oil caps daily, try Aspercreme prn

## 2011-04-02 NOTE — Assessment & Plan Note (Signed)
Encouraged the DASH diet given a handout

## 2011-04-02 NOTE — Assessment & Plan Note (Signed)
Patient has been taking OTC decongestants and has not taken her BP meds today. Will continue Sular and she has recently been switched to Bystolic but was hesitant to start it due to cost. Is given samples today.

## 2011-05-03 ENCOUNTER — Ambulatory Visit (INDEPENDENT_AMBULATORY_CARE_PROVIDER_SITE_OTHER): Payer: Medicare Other | Admitting: Family Medicine

## 2011-05-03 ENCOUNTER — Encounter: Payer: Self-pay | Admitting: Family Medicine

## 2011-05-03 DIAGNOSIS — G479 Sleep disorder, unspecified: Secondary | ICD-10-CM

## 2011-05-03 DIAGNOSIS — K219 Gastro-esophageal reflux disease without esophagitis: Secondary | ICD-10-CM

## 2011-05-03 DIAGNOSIS — M1990 Unspecified osteoarthritis, unspecified site: Secondary | ICD-10-CM

## 2011-05-03 DIAGNOSIS — R1013 Epigastric pain: Secondary | ICD-10-CM

## 2011-05-03 DIAGNOSIS — R0683 Snoring: Secondary | ICD-10-CM

## 2011-05-03 DIAGNOSIS — R5383 Other fatigue: Secondary | ICD-10-CM

## 2011-05-03 DIAGNOSIS — M129 Arthropathy, unspecified: Secondary | ICD-10-CM

## 2011-05-03 DIAGNOSIS — I10 Essential (primary) hypertension: Secondary | ICD-10-CM

## 2011-05-03 LAB — H. PYLORI ANTIBODY, IGG: H Pylori IgG: NEGATIVE

## 2011-05-03 NOTE — Assessment & Plan Note (Signed)
Noting worsening reflux with nausea, luq and epigastric pain intermittently. Will check an H Pylori and increase her Ranitidine to 300mg  qhs, cont Omeprazole 20 mg qam. Stop MegaRed temporarily and maintain a hi fiber bland diet, if no improvement will need referral to endoscopy

## 2011-05-03 NOTE — Patient Instructions (Signed)
sGastroesophageal Reflux Disease, Adult Gastroesophageal reflux disease (GERD) happens when acid from your stomach flows up into the esophagus. When acid comes in contact with the esophagus, the acid causes soreness (inflammation) in the esophagus. Over time, GERD may create small holes (ulcers) in the lining of the esophagus. CAUSES   Increased body weight. This puts pressure on the stomach, making acid rise from the stomach into the esophagus.   Smoking. This increases acid production in the stomach.   Drinking alcohol. This causes decreased pressure in the lower esophageal sphincter (valve or ring of muscle between the esophagus and stomach), allowing acid from the stomach into the esophagus.   Late evening meals and a full stomach. This increases pressure and acid production in the stomach.   A malformed lower esophageal sphincter.  Sometimes, no cause is found. SYMPTOMS   Burning pain in the lower part of the mid-chest behind the breastbone and in the mid-stomach area. This may occur twice a week or more often.   Trouble swallowing.   Sore throat.   Dry cough.   Asthma-like symptoms including chest tightness, shortness of breath, or wheezing.  DIAGNOSIS  Your caregiver may be able to diagnose GERD based on your symptoms. In some cases, X-rays and other tests may be done to check for complications or to check the condition of your stomach and esophagus. TREATMENT  Your caregiver may recommend over-the-counter or prescription medicines to help decrease acid production. Ask your caregiver before starting or adding any new medicines.  HOME CARE INSTRUCTIONS   Change the factors that you can control. Ask your caregiver for guidance concerning weight loss, quitting smoking, and alcohol consumption.   Avoid foods and drinks that make your symptoms worse, such as:   Caffeine or alcoholic drinks.   Chocolate.   Peppermint or mint flavorings.   Garlic and onions.   Spicy foods.     Citrus fruits, such as oranges, lemons, or limes.   Tomato-based foods such as sauce, chili, salsa, and pizza.   Fried and fatty foods.   Avoid lying down for the 3 hours prior to your bedtime or prior to taking a nap.   Eat small, frequent meals instead of large meals.   Wear loose-fitting clothing. Do not wear anything tight around your waist that causes pressure on your stomach.   Raise the head of your bed 6 to 8 inches with wood blocks to help you sleep. Extra pillows will not help.   Only take over-the-counter or prescription medicines for pain, discomfort, or fever as directed by your caregiver.   Do not take aspirin, ibuprofen, or other nonsteroidal anti-inflammatory drugs (NSAIDs).  SEEK IMMEDIATE MEDICAL CARE IF:   You have pain in your arms, neck, jaw, teeth, or back.   Your pain increases or changes in intensity or duration.   You develop nausea, vomiting, or sweating (diaphoresis).   You develop shortness of breath, or you faint.   Your vomit is green, yellow, black, or looks like coffee grounds or blood.   Your stool is red, bloody, or black.  These symptoms could be signs of other problems, such as heart disease, gastric bleeding, or esophageal bleeding. MAKE SURE YOU:   Understand these instructions.   Will watch your condition.   Will get help right away if you are not doing well or get worse.  Document Released: 11/30/2004 Document Revised: 11/02/2010 Document Reviewed: 09/09/2010 [facility] Patient Information 2012 [place], [place].

## 2011-05-03 NOTE — Assessment & Plan Note (Signed)
Resolved, no further treatment, encouraged to try a probiotic daily

## 2011-05-03 NOTE — Progress Notes (Signed)
Patient ID: Erika [name], female   DOB: 12-25-1945, 66 y.o.   MRN: [id number] Erika [name] [id number] 1946/01/01 05/03/2011      Progress Note-Follow Up  Subjective  Chief Complaint  Chief Complaint  Patient presents with  . Follow-up    1 month follow up on BP    HPI  Is a 66 year old Caucasian female who is in today for followup. She has been struggling with worsening reflux she thinks is related to starting her many red. She is yet to stop it to see if it resolves. She is struggling with some dyspepsia, nausea, left upper quadrant and epigastric discomfort. Does not associate it with eating or not eating. She's had no other change in her bowel habits constipation, diarrhea bloody or tarry stool. She had a colonoscopy in 2001 which showed diverticulosis. She's never had an upper endoscopy and has trouble with heartburn for quite some time here in no other acute complaints. She has not been checking her blood pressures routinely. No palpitations, shortness of breath, fevers, congestion, headache.  Past Medical History  Diagnosis Date  . Diverticulosis of colon     colonoscopy 04/14/1999  . Obesity   . Chicken pox as a child  . Measles as a child  . Mumps as a child  . GERD (gastroesophageal reflux disease)   . Hyperlipidemia   . Hypertension   . Osteopenia   . History of fibrocystic disease of breast   . Bronchitis 04/02/2011  . Arthritis 04/02/2011  . IBS (irritable bowel syndrome) 04/02/2011  . Overactive bladder 04/02/2011    Past Surgical History  Procedure Date  . Breast reduction surgery 1986  . Bunion repair 1980    on both feet    Family History  Problem Relation Age of Onset  . Stroke Mother 71  . Hyperlipidemia Mother   . Hypertension Mother   . Heart attack Father 47  . Hypertension Father   . Heart disease Father   . Hypertension Brother   . Aneurysm Brother     aortic  . Hyperlipidemia Brother   . Heart disease Brother     aortic aneurysm rupture    . Multiple sclerosis Daughter   . Alcohol abuse Son   . Schizophrenia Son   . Bipolar disorder Son   . Mental illness Son     schizophrenic, bipolar    History   Social History  . Marital Status: Married    Spouse Name: N/A    Number of Children: N/A  . Years of Education: N/A   Occupational History  . Not on file.   Social History Main Topics  . Smoking status: Never Smoker   . Smokeless tobacco: Never Used  . Alcohol Use: No  . Drug Use: No  . Sexually Active: Yes   Other Topics Concern  . Not on file   Social History Narrative  . No narrative on file    Current Outpatient Prescriptions on File Prior to Visit  Medication Sig Dispense Refill  . aspirin 81 MG tablet Take 81 mg by mouth daily.        Marland Kitchen BYSTOLIC 10 MG tablet TAKE 1 TABLET EVERY DAY  34 tablet  0  . calcium carbonate (TUMS) 500 MG chewable tablet as needed.        . cetirizine (ZYRTEC) 10 MG tablet Take 10 mg by mouth at bedtime as needed.        . chlorpheniramine (CHLOR-TRIMETON) 4 MG tablet Take 4  mg by mouth daily as needed.        . cycloSPORINE (RESTASIS) 0.05 % ophthalmic emulsion Place 1 drop into both eyes 2 (two) times daily.      Marland Kitchen dextromethorphan-guaiFENesin (MUCINEX DM) 30-600 MG per 12 hr tablet Take 1 to 2 tabs every 12 hours as needed       . Multiple Minerals-Vitamins (CITRACAL PLUS PO) Take 1 tablet by mouth daily.        . Multiple Vitamin (MULTIVITAMIN) capsule Take 1 capsule by mouth daily.        . nisoldipine (SULAR) 34 MG 24 hr tablet TAKE 1 TABLET BY MOUTH EVERY MORNING  30 tablet  0  . omeprazole (PRILOSEC) 20 MG capsule Take 20 mg by mouth daily.        . trospium (SANCTURA) 20 MG tablet Take 20 mg by mouth daily.        . diphenhydramine-acetaminophen (TYLENOL PM) 25-500 MG TABS Take 1 tablet by mouth at bedtime as needed.          No Known Allergies  Review of Systems  Review of Systems  Constitutional: Negative for fever and malaise/fatigue.  HENT: Negative for  congestion.   Eyes: Negative for discharge.  Respiratory: Negative for shortness of breath.   Cardiovascular: Negative for chest pain, palpitations and leg swelling.  Gastrointestinal: Positive for heartburn, nausea and abdominal pain. Negative for diarrhea.  Genitourinary: Negative for dysuria.  Musculoskeletal: Negative for falls.  Skin: Negative for rash.  Neurological: Negative for loss of consciousness and headaches.  Endo/Heme/Allergies: Negative for polydipsia.  Psychiatric/Behavioral: Negative for depression and suicidal ideas. The patient is not nervous/anxious and does not have insomnia.     Objective  BP 162/74  Pulse 54  Temp(Src) 98.4 F (36.9 C) (Temporal)  Ht 5\' 6"  (1.676 m)  Wt 180 lb 12.8 oz (82.01 kg)  BMI 29.18 kg/m2  SpO2 99%  Physical Exam  Physical Exam  Constitutional: She is oriented to person, place, and time and well-developed, well-nourished, and in no distress. No distress.  HENT:  Head: Normocephalic and atraumatic.  Eyes: Conjunctivae are normal.  Neck: Neck supple. No thyromegaly present.  Cardiovascular: Normal rate, regular rhythm and normal heart sounds.   No murmur heard. Pulmonary/Chest: Effort normal and breath sounds normal. She has no wheezes.  Abdominal: She exhibits no distension and no mass.  Musculoskeletal: She exhibits no edema.  Lymphadenopathy:    She has no cervical adenopathy.  Neurological: She is alert and oriented to person, place, and time.  Skin: Skin is warm and dry. No rash noted. She is not diaphoretic.  Psychiatric: Memory, affect and judgment normal.    Lab Results  Component Value Date   TSH 0.95 03/29/2011   Lab Results  Component Value Date   WBC 6.9 03/29/2011   HGB 15.0 03/29/2011   HCT 43.0 03/29/2011   MCV 91.8 03/29/2011   PLT 249.0 03/29/2011   Lab Results  Component Value Date   CREATININE 0.9 03/29/2011   BUN 13 03/29/2011   NA 143 03/29/2011   K 4.0 03/29/2011   CL 104 03/29/2011   CO2 30  03/29/2011   Lab Results  Component Value Date   ALT 22 03/29/2011   AST 22 03/29/2011   ALKPHOS 100 03/29/2011   BILITOT 0.8 03/29/2011   Lab Results  Component Value Date   CHOL 252* 03/29/2011   Lab Results  Component Value Date   HDL 37.20* 03/29/2011   Lab Results  Component Value Date   LDLCALC 135* 01/13/2009   Lab Results  Component Value Date   TRIG 188.0* 03/29/2011   Lab Results  Component Value Date   CHOLHDL 7 03/29/2011     Assessment & Plan  Hypertension bp up some today but patient notably uncomfortable and rushing in this am, encouraged DASH diet and we will reassess at next visit. Call to be seen if any concerning symptoms   GERD Noting worsening reflux with nausea, luq and epigastric pain intermittently. Will check an H Pylori and increase her Ranitidine to 300mg  qhs, cont Omeprazole 20 mg qam. Stop MegaRed temporarily and maintain a hi fiber bland diet, if no improvement will need referral to endoscopy  Arthritis Resolved, no further treatment, encouraged to try a probiotic daily

## 2011-05-03 NOTE — Assessment & Plan Note (Signed)
bp up some today but patient notably uncomfortable and rushing in this am, encouraged DASH diet and we will reassess at next visit. Call to be seen if any concerning symptoms

## 2011-05-04 ENCOUNTER — Other Ambulatory Visit: Payer: Self-pay | Admitting: Internal Medicine

## 2011-05-17 ENCOUNTER — Other Ambulatory Visit: Payer: Self-pay | Admitting: Family Medicine

## 2011-05-17 MED ORDER — NISOLDIPINE ER 34 MG PO TB24: 34.0000 mg | ORAL_TABLET | Freq: Every day | ORAL | Status: DC

## 2011-05-17 NOTE — Telephone Encounter (Signed)
RX sent to pharmacy  

## 2011-05-23 ENCOUNTER — Telehealth: Payer: Self-pay | Admitting: *Deleted

## 2011-05-23 NOTE — Telephone Encounter (Signed)
Spoke with Pt husband advise him that per Report Pt was negative for sleep apnea. Pt husband ok info will inform Pt and have her return call with any further questions.Report scan to chart.

## 2011-06-05 ENCOUNTER — Telehealth: Payer: Self-pay | Admitting: Family Medicine

## 2011-06-05 NOTE — Telephone Encounter (Signed)
Please check with the patient and see if she needs anything from [place] and then she would be willing to treat. If not just confirm patient is declining treatment

## 2011-06-05 NOTE — Telephone Encounter (Signed)
Erika [name] is going to fax another copy

## 2011-06-05 NOTE — Telephone Encounter (Signed)
So now I am very confused, I need a copy of the sleep study and the CPAP orders to review. Please print them and I will review

## 2011-06-05 NOTE — Telephone Encounter (Signed)
Please advise? Or note this for FYI?

## 2011-06-05 NOTE — Telephone Encounter (Signed)
Pt is stating that she did the sleep study and it came back that she doesn't have sleep apnea? So why should she need the CPAP? Please advise?

## 2011-06-06 ENCOUNTER — Encounter: Payer: Self-pay | Admitting: Family Medicine

## 2011-06-06 NOTE — Telephone Encounter (Signed)
After reviewing all of the [employer] paperwork I have decided I agree with the initial assessment that she does not qualify for cpap, for some reason they sent orders for it and we were not in possession of the actual report when that came through so I signed it. Let her know she does have some very mild hypopnea but not enough to warrant active treatment, just try to keep the weight down and no alcohol which worsens it.

## 2011-06-06 NOTE — Telephone Encounter (Signed)
[employer] report is on MDs desk.

## 2011-06-06 NOTE — Telephone Encounter (Signed)
Holly at [employer] was informed to cancel the referral for the CPAP.

## 2011-06-06 NOTE — Telephone Encounter (Signed)
Left a detailed message on patients home answering machine 

## 2011-06-09 ENCOUNTER — Other Ambulatory Visit: Payer: Self-pay | Admitting: Internal Medicine

## 2011-06-14 ENCOUNTER — Ambulatory Visit (INDEPENDENT_AMBULATORY_CARE_PROVIDER_SITE_OTHER): Payer: Medicare Other | Admitting: Family Medicine

## 2011-06-14 ENCOUNTER — Encounter: Payer: Self-pay | Admitting: Family Medicine

## 2011-06-14 VITALS — BP 188/69 | HR 57 | Temp 97.8°F | Ht 66.0 in | Wt 181.8 lb

## 2011-06-14 DIAGNOSIS — S46912A Strain of unspecified muscle, fascia and tendon at shoulder and upper arm level, left arm, initial encounter: Secondary | ICD-10-CM

## 2011-06-14 DIAGNOSIS — M25519 Pain in unspecified shoulder: Secondary | ICD-10-CM

## 2011-06-14 DIAGNOSIS — I10 Essential (primary) hypertension: Secondary | ICD-10-CM

## 2011-06-14 DIAGNOSIS — M25511 Pain in right shoulder: Secondary | ICD-10-CM

## 2011-06-14 DIAGNOSIS — IMO0002 Reserved for concepts with insufficient information to code with codable children: Secondary | ICD-10-CM

## 2011-06-14 DIAGNOSIS — K219 Gastro-esophageal reflux disease without esophagitis: Secondary | ICD-10-CM

## 2011-06-14 DIAGNOSIS — M25512 Pain in left shoulder: Secondary | ICD-10-CM | POA: Insufficient documentation

## 2011-06-14 DIAGNOSIS — E785 Hyperlipidemia, unspecified: Secondary | ICD-10-CM

## 2011-06-14 HISTORY — DX: Pain in right shoulder: M25.511

## 2011-06-14 MED ORDER — CHLORTHALIDONE 25 MG PO TABS: 25.0000 mg | ORAL_TABLET | Freq: Every day | ORAL | Status: DC

## 2011-06-14 MED ORDER — NEBIVOLOL HCL 10 MG PO TABS: 10.0000 mg | ORAL_TABLET | Freq: Every day | ORAL | Status: DC

## 2011-06-14 MED ORDER — CYCLOBENZAPRINE HCL 10 MG PO TABS: ORAL_TABLET | ORAL | Status: DC

## 2011-06-14 NOTE — Assessment & Plan Note (Signed)
Add Chlorthalidone and given samples of Bystolic

## 2011-06-14 NOTE — Assessment & Plan Note (Signed)
Has tried to decrease carbs,take fish oil, increase exercise, will recheck levels today

## 2011-06-14 NOTE — Assessment & Plan Note (Signed)
Rest, alternate heat and ice, Cyclobenzaprine qhs and Ibuprofen 400 mg with food bid for next week or so and then call if not improving for possible referral for physiatry and PT

## 2011-06-14 NOTE — Patient Instructions (Signed)
Shoulder Pain The shoulder is a ball and socket joint. The muscles and tendons (rotator cuff) are what keep the shoulder in its joint and stable. This collection of muscles and tendons holds in the head (ball) of the humerus (upper arm bone) in the fossa (cup) of the scapula (shoulder blade). Today no reason was found for your shoulder pain. Often pain in the shoulder may be treated conservatively with temporary immobilization. For example, holding the shoulder in one place using a sling for rest. Physical therapy may be needed if problems continue. HOME CARE INSTRUCTIONS   Apply ice to the sore area for 15 to 20 minutes, 3 to 4 times per day for the first 2 days. Put the ice in a plastic bag. Place a towel between the bag of ice and your skin.   If you have or were given a shoulder sling and straps, do not remove for as long as directed by your caregiver or until you see a caregiver for a follow-up examination. If you need to remove it to shower or bathe, move your arm as little as possible.   Sleep on several pillows at night to lessen swelling and pain.   Only take over-the-counter or prescription medicines for pain, discomfort, or fever as directed by your caregiver.   Keep any follow-up appointments in order to avoid any type of permanent shoulder disability or chronic pain problems.  SEEK MEDICAL CARE IF:   Pain in your shoulder increases or new pain develops in your arm, hand, or fingers.   Your hand or fingers are colder than your other hand.   You do not obtain pain relief with the medications or your pain becomes worse.  SEEK IMMEDIATE MEDICAL CARE IF:   Your arm, hand, or fingers are numb or tingling.   Your arm, hand, or fingers are swollen, painful, or turn white or blue.   You develop chest pain or shortness of breath.  MAKE SURE YOU:   Understand these instructions.   Will watch your condition.   Will get help right away if you are not doing well or get worse.    Document Released: 11/30/2004 Document Revised: 02/09/2011 Document Reviewed: 02/04/2011 [facility] Patient Information 2012 [place], [place].  Physiatrist, is a physical medicine doctor at [facility]  Ibuprofen 1-2 tabs twice daily for next 1-2 weeks with food

## 2011-06-14 NOTE — Progress Notes (Signed)
Patient ID: Erika [name], female   DOB: Sep 16, 1945, 66 y.o.   MRN: [id number] Erika [name] [id number] Jul 11, 1945 06/14/2011      Progress Note-Follow Up  Subjective  Chief Complaint  Chief Complaint  Patient presents with  . Follow-up    6 week     HPI  Patient is a 66 year old Caucasian female in today for followup. She been taking her blood pressure but has not had a recent it's been up. She's not had any increase in headaches or chest pain malaise or myalgias. She does note about 3 weeks ago she was worked out with him and she strained her left shoulder. She's been trying to rest it but she still has daily pain. Somewhat limited mobility due to the pain. She has been alternating heat and ice and does believe that helped somewhat. No radicular symptoms are noted in her arm. Otherwise she feels well. She's begun to exercise more decrease her carbohydrate intake and overall feels she is doing well. She uses ibuprofen occasionally for some limited relief of pain in her shoulder  Past Medical History  Diagnosis Date  . Diverticulosis of colon     colonoscopy 04/14/1999  . Obesity   . Chicken pox as a child  . Measles as a child  . Mumps as a child  . GERD (gastroesophageal reflux disease)   . Hyperlipidemia   . Hypertension   . Osteopenia   . History of fibrocystic disease of breast   . Bronchitis 04/02/2011  . Arthritis 04/02/2011  . IBS (irritable bowel syndrome) 04/02/2011  . Overactive bladder 04/02/2011  . Left shoulder strain 06/14/2011    Past Surgical History  Procedure Date  . Breast reduction surgery 1986  . Bunion repair 1980    on both feet    Family History  Problem Relation Age of Onset  . Stroke Mother 25  . Hyperlipidemia Mother   . Hypertension Mother   . Heart attack Father 5  . Hypertension Father   . Heart disease Father   . Hypertension Brother   . Aneurysm Brother     aortic  . Hyperlipidemia Brother   . Heart disease Brother     aortic  aneurysm rupture  . Multiple sclerosis Daughter   . Alcohol abuse Son   . Schizophrenia Son   . Bipolar disorder Son   . Mental illness Son     schizophrenic, bipolar    History   Social History  . Marital Status: Married    Spouse Name: N/A    Number of Children: N/A  . Years of Education: N/A   Occupational History  . Not on file.   Social History Main Topics  . Smoking status: Never Smoker   . Smokeless tobacco: Never Used  . Alcohol Use: No  . Drug Use: No  . Sexually Active: Yes   Other Topics Concern  . Not on file   Social History Narrative  . No narrative on file    Current Outpatient Prescriptions on File Prior to Visit  Medication Sig Dispense Refill  . aspirin 81 MG tablet Take 81 mg by mouth daily.        . calcium carbonate (TUMS) 500 MG chewable tablet as needed.        . cetirizine (ZYRTEC) 10 MG tablet Take 10 mg by mouth at bedtime as needed.        . chlorpheniramine (CHLOR-TRIMETON) 4 MG tablet Take 4 mg by mouth daily as needed.       Marland Kitchen  cycloSPORINE (RESTASIS) 0.05 % ophthalmic emulsion Place 1 drop into both eyes 2 (two) times daily.      Marland Kitchen dextromethorphan-guaiFENesin (MUCINEX DM) 30-600 MG per 12 hr tablet Take 1 to 2 tabs every 12 hours as needed       . diphenhydramine-acetaminophen (TYLENOL PM) 25-500 MG TABS Take 1 tablet by mouth at bedtime as needed.        . Multiple Minerals-Vitamins (CITRACAL PLUS PO) Take 1 tablet by mouth daily.        . Multiple Vitamin (MULTIVITAMIN) capsule Take 1 capsule by mouth daily.        . nisoldipine (SULAR) 34 MG 24 hr tablet Take 1 tablet (34 mg total) by mouth daily.  30 tablet  2  . omeprazole (PRILOSEC) 20 MG capsule Take 20 mg by mouth daily.        Marland Kitchen OVER THE COUNTER MEDICATION MegaRed- 1 daily      . trospium (SANCTURA) 20 MG tablet Take 20 mg by mouth daily.        Marland Kitchen DISCONTD: BYSTOLIC 10 MG tablet TAKE 1 TABLET EVERY DAY  34 tablet  0  . chlorthalidone (HYGROTON) 25 MG tablet Take 1 tablet (25 mg  total) by mouth daily.  30 tablet  3    No Known Allergies  Review of Systems  Review of Systems  Constitutional: Negative for fever and malaise/fatigue.  HENT: Negative for congestion.   Eyes: Negative for discharge.  Respiratory: Negative for shortness of breath.   Cardiovascular: Negative for chest pain, palpitations and leg swelling.  Gastrointestinal: Negative for nausea, abdominal pain and diarrhea.  Genitourinary: Negative for dysuria.  Musculoskeletal: Positive for joint pain. Negative for falls.       Left shoulder for about 3 weeks  Skin: Negative for rash.  Neurological: Negative for loss of consciousness and headaches.  Endo/Heme/Allergies: Negative for polydipsia.  Psychiatric/Behavioral: Negative for depression and suicidal ideas. The patient is not nervous/anxious and does not have insomnia.     Objective  BP 188/69  Pulse 57  Temp(Src) 97.8 F (36.6 C) (Temporal)  Ht 5\' 6"  (1.676 m)  Wt 181 lb 12.8 oz (82.464 kg)  BMI 29.34 kg/m2  SpO2 96%  Physical Exam  Physical Exam  Constitutional: She is oriented to person, place, and time and well-developed, well-nourished, and in no distress. No distress.  HENT:  Head: Normocephalic and atraumatic.  Eyes: Conjunctivae are normal.  Neck: Neck supple. No thyromegaly present.  Cardiovascular: Normal rate, regular rhythm and normal heart sounds.   No murmur heard. Pulmonary/Chest: Effort normal and breath sounds normal. She has no wheezes.  Abdominal: She exhibits no distension and no mass.  Musculoskeletal: She exhibits no edema.  Lymphadenopathy:    She has no cervical adenopathy.  Neurological: She is alert and oriented to person, place, and time.  Skin: Skin is warm and dry. No rash noted. She is not diaphoretic.  Psychiatric: Memory, affect and judgment normal.    Lab Results  Component Value Date   TSH 0.95 03/29/2011   Lab Results  Component Value Date   WBC 6.9 03/29/2011   HGB 15.0 03/29/2011    HCT 43.0 03/29/2011   MCV 91.8 03/29/2011   PLT 249.0 03/29/2011   Lab Results  Component Value Date   CREATININE 0.9 03/29/2011   BUN 13 03/29/2011   NA 143 03/29/2011   K 4.0 03/29/2011   CL 104 03/29/2011   CO2 30 03/29/2011   Lab Results  Component  Value Date   ALT 22 03/29/2011   AST 22 03/29/2011   ALKPHOS 100 03/29/2011   BILITOT 0.8 03/29/2011   Lab Results  Component Value Date   CHOL 252* 03/29/2011   Lab Results  Component Value Date   HDL 37.20* 03/29/2011   Lab Results  Component Value Date   LDLCALC 135* 01/13/2009   Lab Results  Component Value Date   TRIG 188.0* 03/29/2011   Lab Results  Component Value Date   CHOLHDL 7 03/29/2011     Assessment & Plan  Left shoulder strain Rest, alternate heat and ice, Cyclobenzaprine qhs and Ibuprofen 400 mg with food bid for next week or so and then call if not improving for possible referral for physiatry and PT  Hypertension Add Chlorthalidone and given samples of Bystolic  GERD Well controlled  HYPERLIPIDEMIA Has tried to decrease carbs,take fish oil, increase exercise, will recheck levels today

## 2011-06-14 NOTE — Assessment & Plan Note (Signed)
Well controlled 

## 2011-07-19 ENCOUNTER — Ambulatory Visit (INDEPENDENT_AMBULATORY_CARE_PROVIDER_SITE_OTHER): Payer: Medicare Other | Admitting: Family Medicine

## 2011-07-19 ENCOUNTER — Encounter: Payer: Self-pay | Admitting: Family Medicine

## 2011-07-19 VITALS — BP 146/75 | HR 51 | Temp 98.3°F | Ht 66.0 in | Wt 179.4 lb

## 2011-07-19 DIAGNOSIS — S46912A Strain of unspecified muscle, fascia and tendon at shoulder and upper arm level, left arm, initial encounter: Secondary | ICD-10-CM

## 2011-07-19 DIAGNOSIS — J40 Bronchitis, not specified as acute or chronic: Secondary | ICD-10-CM

## 2011-07-19 DIAGNOSIS — M899 Disorder of bone, unspecified: Secondary | ICD-10-CM

## 2011-07-19 DIAGNOSIS — M8580 Other specified disorders of bone density and structure, unspecified site: Secondary | ICD-10-CM

## 2011-07-19 DIAGNOSIS — IMO0002 Reserved for concepts with insufficient information to code with codable children: Secondary | ICD-10-CM

## 2011-07-19 DIAGNOSIS — I10 Essential (primary) hypertension: Secondary | ICD-10-CM

## 2011-07-19 DIAGNOSIS — E785 Hyperlipidemia, unspecified: Secondary | ICD-10-CM

## 2011-07-19 LAB — HEPATIC FUNCTION PANEL
ALT: 25 U/L (ref 0–35)
AST: 26 U/L (ref 0–37)
Total Bilirubin: 0.8 mg/dL (ref 0.3–1.2)
Total Protein: 8 g/dL (ref 6.0–8.3)

## 2011-07-19 LAB — LDL CHOLESTEROL, DIRECT: Direct LDL: 190.3 mg/dL

## 2011-07-19 LAB — RENAL FUNCTION PANEL
Creatinine, Ser: 0.8 mg/dL (ref 0.4–1.2)
GFR: 77.51 mL/min (ref >60.00)
Glucose, Bld: 93 mg/dL (ref 70–99)
Sodium: 136 mEq/L (ref 135–145)

## 2011-07-19 LAB — LIPID PANEL
Cholesterol: 248 mg/dL — ABNORMAL HIGH (ref 0–200)
Triglycerides: 147 mg/dL (ref 0.0–149.0)

## 2011-07-19 NOTE — Assessment & Plan Note (Signed)
Resolved, no concerns 

## 2011-07-19 NOTE — Assessment & Plan Note (Signed)
Improving with occasional Flexeril and no heavy lifting. Is happy with improvement and does not want referral for PT or evaluation at this time. Will call if symptoms worsen

## 2011-07-19 NOTE — Assessment & Plan Note (Signed)
Has appt with her GYN for screening with densitometry, MGM and pap this summer.

## 2011-07-19 NOTE — Patient Instructions (Signed)

## 2011-07-19 NOTE — Assessment & Plan Note (Signed)
Adequately controlled today, no changes to meds, minimize sodium

## 2011-07-19 NOTE — Progress Notes (Signed)
Patient ID: Erika [name], female   DOB: [birth year], 66 y.o.   MRN: [id number] Erika [name] [id number] [birth year] 07/19/2011      Progress Note-Follow Up  Subjective  Chief Complaint  Chief Complaint  Patient presents with  . Follow-up    1 month follow up/ wants cholesterol checked    HPI  66 year old Caucasian female who is in today for routine followup. Her cholesterol was up significantly earlier this year. She has avoided trans fats, added Krill oil and increased her exercise.  She is fasting today to have it checked. She says otherwise she feels well. She's had no other recent illness. Her cough is resolved. No complaints of fevers, headaches, chest pain or palpitation, shortness of breath, GI or GU complaints. She has not checked her blood pressure since her last visit. Left shoulder pain is largely resolved. She has occasional short-lived pain in the shoulder itself with certain rotational movements but this is infrequent. No radicular symptoms weakness.  Past Medical History  Diagnosis Date  . Diverticulosis of colon     colonoscopy 04/14/1999  . Obesity   . Chicken pox as a child  . Measles as a child  . Mumps as a child  . GERD (gastroesophageal reflux disease)   . Hyperlipidemia   . Hypertension   . Osteopenia   . History of fibrocystic disease of breast   . Bronchitis 04/02/2011  . Arthritis 04/02/2011  . IBS (irritable bowel syndrome) 04/02/2011  . Overactive bladder 04/02/2011  . Left shoulder strain 06/14/2011    Past Surgical History  Procedure Date  . Breast reduction surgery 1986  . Bunion repair 1980    on both feet    Family History  Problem Relation Age of Onset  . Stroke Mother 55  . Hyperlipidemia Mother   . Hypertension Mother   . Heart attack Father 92  . Hypertension Father   . Heart disease Father   . Hypertension Brother   . Aneurysm Brother     aortic  . Hyperlipidemia Brother   . Heart disease Brother     aortic aneurysm rupture  .  Multiple sclerosis Daughter   . Alcohol abuse Son   . Schizophrenia Son   . Bipolar disorder Son   . Mental illness Son     schizophrenic, bipolar    History   Social History  . Marital Status: Married    Spouse Name: N/A    Number of Children: N/A  . Years of Education: N/A   Occupational History  . Not on file.   Social History Main Topics  . Smoking status: Never Smoker   . Smokeless tobacco: Never Used  . Alcohol Use: No  . Drug Use: No  . Sexually Active: Yes   Other Topics Concern  . Not on file   Social History Narrative  . No narrative on file    Current Outpatient Prescriptions on File Prior to Visit  Medication Sig Dispense Refill  . aspirin 81 MG tablet Take 81 mg by mouth daily.        . calcium carbonate (TUMS) 500 MG chewable tablet as needed.        . chlorpheniramine (CHLOR-TRIMETON) 4 MG tablet Take 4 mg by mouth daily as needed.       . chlorthalidone (HYGROTON) 25 MG tablet Take 1 tablet (25 mg total) by mouth daily.  30 tablet  3  . cycloSPORINE (RESTASIS) 0.05 % ophthalmic emulsion Place 1 drop into both  eyes 2 (two) times daily.      Marland Kitchen dextromethorphan-guaiFENesin (MUCINEX DM) 30-600 MG per 12 hr tablet Take 1 to 2 tabs every 12 hours as needed       . diphenhydramine-acetaminophen (TYLENOL PM) 25-500 MG TABS Take 1 tablet by mouth at bedtime as needed.        . Multiple Minerals-Vitamins (CITRACAL PLUS PO) Take 1 tablet by mouth daily.        . Multiple Vitamin (MULTIVITAMIN) capsule Take 1 capsule by mouth daily.        . nebivolol (BYSTOLIC) 10 MG tablet Take 1 tablet (10 mg total) by mouth daily.  30 tablet  5  . nisoldipine (SULAR) 34 MG 24 hr tablet Take 1 tablet (34 mg total) by mouth daily.  30 tablet  2  . omeprazole (PRILOSEC) 20 MG capsule Take 20 mg by mouth daily.        Marland Kitchen OVER THE COUNTER MEDICATION MegaRed- 1 daily      . trospium (SANCTURA) 20 MG tablet Take 20 mg by mouth daily.        . cetirizine (ZYRTEC) 10 MG tablet Take 10  mg by mouth at bedtime as needed.          No Known Allergies  Review of Systems  Review of Systems  Constitutional: Negative for fever and malaise/fatigue.  HENT: Negative for congestion.   Eyes: Negative for discharge.  Respiratory: Negative for shortness of breath.   Cardiovascular: Negative for chest pain, palpitations and leg swelling.  Gastrointestinal: Negative for nausea, abdominal pain and diarrhea.  Genitourinary: Negative for dysuria.  Musculoskeletal: Positive for joint pain. Negative for falls.       Left shoulder pain is minimal with certain movements now  Skin: Negative for rash.  Neurological: Negative for loss of consciousness and headaches.  Endo/Heme/Allergies: Negative for polydipsia.  Psychiatric/Behavioral: Negative for depression and suicidal ideas. The patient is not nervous/anxious and does not have insomnia.     Objective  BP 146/75  Pulse 51  Temp(Src) 98.3 F (36.8 C) (Temporal)  Ht 5\' 6"  (1.676 m)  Wt 179 lb 6.4 oz (81.375 kg)  BMI 28.96 kg/m2  SpO2 98%  Physical Exam  Physical Exam  Constitutional: She is oriented to person, place, and time and well-developed, well-nourished, and in no distress. No distress.  HENT:  Head: Normocephalic and atraumatic.  Eyes: Conjunctivae are normal.  Neck: Neck supple. No thyromegaly present.  Cardiovascular: Normal rate, regular rhythm and normal heart sounds.   No murmur heard. Pulmonary/Chest: Effort normal and breath sounds normal. She has no wheezes.  Abdominal: She exhibits no distension and no mass.  Musculoskeletal: She exhibits no edema.  Lymphadenopathy:    She has no cervical adenopathy.  Neurological: She is alert and oriented to person, place, and time.  Skin: Skin is warm and dry. No rash noted. She is not diaphoretic.  Psychiatric: Memory, affect and judgment normal.    Lab Results  Component Value Date   TSH 0.95 03/29/2011   Lab Results  Component Value Date   WBC 6.9 03/29/2011     HGB 15.0 03/29/2011   HCT 43.0 03/29/2011   MCV 91.8 03/29/2011   PLT 249.0 03/29/2011   Lab Results  Component Value Date   CREATININE 0.9 03/29/2011   BUN 13 03/29/2011   NA 143 03/29/2011   K 4.0 03/29/2011   CL 104 03/29/2011   CO2 30 03/29/2011   Lab Results  Component Value Date  ALT 22 03/29/2011   AST 22 03/29/2011   ALKPHOS 100 03/29/2011   BILITOT 0.8 03/29/2011   Lab Results  Component Value Date   CHOL 252* 03/29/2011   Lab Results  Component Value Date   HDL 37.20* 03/29/2011   Lab Results  Component Value Date   LDLCALC 135* 01/13/2009   Lab Results  Component Value Date   TRIG 188.0* 03/29/2011   Lab Results  Component Value Date   CHOLHDL 7 03/29/2011     Assessment & Plan  Bronchitis Resolved, no concerns  Hypertension Adequately controlled today, no changes to meds, minimize sodium  Osteopenia Has appt with her GYN for screening with densitometry, MGM and pap this summer.  Left shoulder strain Improving with occasional Flexeril and no heavy lifting. Is happy with improvement and does not want referral for PT or evaluation at this time. Will call if symptoms worsen  HYPERLIPIDEMIA Patient has been avoiding trans fats, exercising more and has added Krill oil daily, will recheck lipids today

## 2011-07-19 NOTE — Assessment & Plan Note (Signed)
Patient has been avoiding trans fats, exercising more and has added Krill oil daily, will recheck lipids today

## 2011-10-09 ENCOUNTER — Other Ambulatory Visit: Payer: Self-pay

## 2011-10-09 MED ORDER — NISOLDIPINE ER 34 MG PO TB24: 34.0000 mg | ORAL_TABLET | Freq: Every day | ORAL | Status: DC

## 2012-01-03 ENCOUNTER — Other Ambulatory Visit: Payer: Self-pay

## 2012-01-03 MED ORDER — NISOLDIPINE ER 34 MG PO TB24: 34.0000 mg | ORAL_TABLET | Freq: Every day | ORAL | Status: DC

## 2012-01-12 ENCOUNTER — Other Ambulatory Visit: Payer: Self-pay

## 2012-01-12 MED ORDER — CHLORTHALIDONE 25 MG PO TABS: 25.0000 mg | ORAL_TABLET | Freq: Every day | ORAL | Status: DC

## 2012-02-07 ENCOUNTER — Other Ambulatory Visit: Payer: Self-pay | Admitting: Family Medicine

## 2012-03-13 ENCOUNTER — Encounter: Payer: Self-pay | Admitting: Family Medicine

## 2012-03-13 ENCOUNTER — Ambulatory Visit (INDEPENDENT_AMBULATORY_CARE_PROVIDER_SITE_OTHER): Payer: Medicare Other | Admitting: Family Medicine

## 2012-03-13 VITALS — BP 188/78 | HR 97 | Temp 98.2°F | Ht 66.0 in | Wt 177.8 lb

## 2012-03-13 DIAGNOSIS — K5730 Diverticulosis of large intestine without perforation or abscess without bleeding: Secondary | ICD-10-CM

## 2012-03-13 DIAGNOSIS — M8580 Other specified disorders of bone density and structure, unspecified site: Secondary | ICD-10-CM

## 2012-03-13 DIAGNOSIS — R682 Dry mouth, unspecified: Secondary | ICD-10-CM

## 2012-03-13 DIAGNOSIS — I10 Essential (primary) hypertension: Secondary | ICD-10-CM

## 2012-03-13 DIAGNOSIS — M899 Disorder of bone, unspecified: Secondary | ICD-10-CM

## 2012-03-13 DIAGNOSIS — E785 Hyperlipidemia, unspecified: Secondary | ICD-10-CM

## 2012-03-13 DIAGNOSIS — Z0000 Encounter for general adult medical examination without abnormal findings: Secondary | ICD-10-CM

## 2012-03-13 DIAGNOSIS — M949 Disorder of cartilage, unspecified: Secondary | ICD-10-CM

## 2012-03-13 DIAGNOSIS — K117 Disturbances of salivary secretion: Secondary | ICD-10-CM

## 2012-03-13 DIAGNOSIS — K219 Gastro-esophageal reflux disease without esophagitis: Secondary | ICD-10-CM

## 2012-03-13 HISTORY — DX: Dry mouth, unspecified: R68.2

## 2012-03-13 HISTORY — DX: Encounter for general adult medical examination without abnormal findings: Z00.00

## 2012-03-13 LAB — RENAL FUNCTION PANEL
Albumin: 3.6 g/dL (ref 3.5–5.2)
Chloride: 99 mEq/L (ref 96–112)
Phosphorus: 3.8 mg/dL (ref 2.3–4.6)
Potassium: 3.3 mEq/L — ABNORMAL LOW (ref 3.5–5.1)

## 2012-03-13 LAB — CBC
MCHC: 34.1 g/dL (ref 30.0–36.0)
RDW: 12.1 % (ref 11.5–14.6)

## 2012-03-13 LAB — LIPID PANEL
Cholesterol: 246 mg/dL — ABNORMAL HIGH (ref 0–200)
Triglycerides: 136 mg/dL (ref 0.0–149.0)

## 2012-03-13 LAB — HEPATIC FUNCTION PANEL
Bilirubin, Direct: 0 mg/dL (ref 0.0–0.3)
Total Bilirubin: 0.8 mg/dL (ref 0.3–1.2)
Total Protein: 7 g/dL (ref 6.0–8.3)

## 2012-03-13 LAB — LDL CHOLESTEROL, DIRECT: Direct LDL: 169.8 mg/dL

## 2012-03-13 MED ORDER — LISINOPRIL 10 MG PO TABS: 10.0000 mg | ORAL_TABLET | Freq: Every day | ORAL | Status: DC

## 2012-03-13 NOTE — Progress Notes (Signed)
Patient ID: Erika [name], female   DOB: [birth year], 67 y.o.   MRN: [id number] ELLIOTT QUADE [id number] 01-Apr-1945 03/13/2012      Progress Note New Patient  Subjective  Chief Complaint  Chief Complaint  Patient presents with  . Annual Exam    physical w/labs    HPI  Patient is a 67 year old Caucasian female who is in today for annual exam. Overall she feels well. She continues to follow Dr.: For her GYN in mammography care. She does not acknowledge any symptoms consistent with elevated blood pressure. No headaches, chest pain palpitations flushing or concerns. Does note she occasionally has some trouble with reflux and discomfort in epigastrium when she lies down or sits down in the legs. No other recent illness. No trips to the emergency room GI or GU complaints at this time  Past Medical History  Diagnosis Date  . Diverticulosis of colon     colonoscopy 04/14/1999  . Obesity   . Chicken pox as a child  . Measles as a child  . Mumps as a child  . GERD (gastroesophageal reflux disease)   . Hyperlipidemia   . Hypertension   . Osteopenia   . History of fibrocystic disease of breast   . Bronchitis 04/02/2011  . Arthritis 04/02/2011  . IBS (irritable bowel syndrome) 04/02/2011  . Overactive bladder 04/02/2011  . Left shoulder strain 06/14/2011  . Dry mouth 03/13/2012  . Preventative health care 03/13/2012    Past Surgical History  Procedure Date  . Breast reduction surgery 1986  . Bunion repair 1980    on both feet    Family History  Problem Relation Age of Onset  . Stroke Mother 76  . Hyperlipidemia Mother   . Hypertension Mother   . Heart attack Father 66  . Hypertension Father   . Heart disease Father   . Hypertension Brother   . Aneurysm Brother     aortic  . Hyperlipidemia Brother   . Heart disease Brother     aortic aneurysm rupture  . Multiple sclerosis Daughter   . Alcohol abuse Son   . Schizophrenia Son   . Bipolar disorder Son   . Mental illness Son    schizophrenic, bipolar    History   Social History  . Marital Status: Married    Spouse Name: N/A    Number of Children: N/A  . Years of Education: N/A   Occupational History  . Not on file.   Social History Main Topics  . Smoking status: Never Smoker   . Smokeless tobacco: Never Used  . Alcohol Use: No  . Drug Use: No  . Sexually Active: Yes   Other Topics Concern  . Not on file   Social History Narrative  . No narrative on file    Current Outpatient Prescriptions on File Prior to Visit  Medication Sig Dispense Refill  . aspirin 81 MG tablet Take 81 mg by mouth daily.        Marland Kitchen BYSTOLIC 10 MG tablet TAKE 1 TABLET EVERY DAY  30 tablet  5  . calcium carbonate (TUMS) 500 MG chewable tablet as needed.        . cetirizine (ZYRTEC) 10 MG tablet Take 10 mg by mouth at bedtime as needed.        . chlorpheniramine (CHLOR-TRIMETON) 4 MG tablet Take 4 mg by mouth daily as needed.       . chlorthalidone (HYGROTON) 25 MG tablet Take 1 tablet (25 mg  total) by mouth daily.  30 tablet  3  . cycloSPORINE (RESTASIS) 0.05 % ophthalmic emulsion Place 1 drop into both eyes 2 (two) times daily.      Marland Kitchen dextromethorphan-guaiFENesin (MUCINEX DM) 30-600 MG per 12 hr tablet Take 1 to 2 tabs every 12 hours as needed       . diphenhydramine-acetaminophen (TYLENOL PM) 25-500 MG TABS Take 1 tablet by mouth at bedtime as needed.        . Multiple Minerals-Vitamins (CITRACAL PLUS PO) Take 1 tablet by mouth daily.        . Multiple Vitamin (MULTIVITAMIN) capsule Take 1 capsule by mouth daily.        . nisoldipine (SULAR) 34 MG 24 hr tablet Take 1 tablet (34 mg total) by mouth daily.  30 tablet  2  . omeprazole (PRILOSEC) 20 MG capsule Take 20 mg by mouth daily.        Marland Kitchen OVER THE COUNTER MEDICATION MegaRed- 1 daily      . trospium (SANCTURA) 20 MG tablet Take 20 mg by mouth daily.        Marland Kitchen lisinopril (PRINIVIL,ZESTRIL) 10 MG tablet Take 1 tablet (10 mg total) by mouth daily.  90 tablet  3    No Known  Allergies  Review of Systems  Review of Systems  Constitutional: Negative for fever, chills and malaise/fatigue.  HENT: Negative for hearing loss, nosebleeds and congestion.   Eyes: Negative for discharge.  Respiratory: Negative for cough, sputum production, shortness of breath and wheezing.   Cardiovascular: Negative for chest pain, palpitations and leg swelling.  Gastrointestinal: Positive for heartburn and abdominal pain. Negative for nausea, vomiting, diarrhea, constipation and blood in stool.       Epigastric discomfort intermittently in eve upon sitting or lying down  Genitourinary: Negative for dysuria, urgency, frequency and hematuria.  Musculoskeletal: Negative for myalgias, back pain and falls.  Skin: Negative for rash.  Neurological: Negative for dizziness, tremors, sensory change, focal weakness, loss of consciousness, weakness and headaches.  Endo/Heme/Allergies: Negative for polydipsia. Does not bruise/bleed easily.  Psychiatric/Behavioral: Negative for depression and suicidal ideas. The patient is not nervous/anxious and does not have insomnia.     Objective  BP 188/78  Pulse 97  Temp 98.2 F (36.8 C) (Temporal)  Ht 5\' 6"  (1.676 m)  Wt 177 lb 12.8 oz (80.65 kg)  BMI 28.70 kg/m2  SpO2 97%  Physical Exam  Physical Exam  Constitutional: She is oriented to person, place, and time and well-developed, well-nourished, and in no distress. No distress.  HENT:  Head: Normocephalic and atraumatic.  Right Ear: External ear normal.  Left Ear: External ear normal.  Nose: Nose normal.  Mouth/Throat: Oropharynx is clear and moist. No oropharyngeal exudate.  Eyes: Conjunctivae normal are normal. Pupils are equal, round, and reactive to light. Right eye exhibits no discharge. Left eye exhibits no discharge. No scleral icterus.  Neck: Normal range of motion. Neck supple. No thyromegaly present.  Cardiovascular: Regular rhythm, normal heart sounds and intact distal pulses.   No  murmur heard.      bradycardia  Pulmonary/Chest: Effort normal and breath sounds normal. No respiratory distress. She has no wheezes. She has no rales.  Abdominal: Soft. Bowel sounds are normal. She exhibits no distension and no mass. There is no tenderness.  Musculoskeletal: Normal range of motion. She exhibits no edema and no tenderness.  Lymphadenopathy:    She has no cervical adenopathy.  Neurological: She is alert and oriented to person,  place, and time. She has normal reflexes. No cranial nerve deficit. Coordination normal.  Skin: Skin is warm and dry. No rash noted. She is not diaphoretic.  Psychiatric: Mood, memory and affect normal.       Assessment & Plan  Hypertension Poorly controlled here today, will add Lisinopril 10 mg daily and asked to minimize sodium and caffeine. Recheck bp and renal in 3 weeks and adjust from there, warned about side effects.  HYPERLIPIDEMIA Repeat lipid today, fasting. Did not tolerate Krill oil caused excessive dyspepsia. Encouraged to try Flaxseed (oil) and to continue to exercise. Avoid processed foods, trans and saturated fats.  GERD Omeprazole controls her symptoms mostly but she does have epigastric discomfort at times she thinks is weight related. She is encouraged to eat small meals and avoid fatty, spicy, acidic foods. Notify us if symptoms worsen.  Diverticulosis of colon No complaints of abdominal pain or constipation. Encouraged high fiber, hi fluid diet  Dry mouth Multifactorial. Combination of chlorthalidone and [place], excessive caffeine use. Encouraged decreased caffeine and increasing clear fluids. No change in meds  Osteopenia Check Vitamin D level today. Continue bid use of ca/vit d and increase upper body exercise, continue walking  Preventative health care Encouraged heart healthy diet and increase exercise. Increased sleep is encouraged

## 2012-03-13 NOTE — Assessment & Plan Note (Signed)
Check Vitamin D level today. Continue bid use of ca/vit d and increase upper body exercise, continue walking

## 2012-03-13 NOTE — Assessment & Plan Note (Signed)
Poorly controlled here today, will add Lisinopril 10 mg daily and asked to minimize sodium and caffeine. Recheck bp and renal in 3 weeks and adjust from there, warned about side effects.

## 2012-03-13 NOTE — Assessment & Plan Note (Addendum)
No complaints of abdominal pain or constipation. Encouraged high fiber, hi fluid diet

## 2012-03-13 NOTE — Assessment & Plan Note (Signed)
Repeat lipid today, fasting. Did not tolerate Krill oil caused excessive dyspepsia. Encouraged to try Flaxseed (oil) and to continue to exercise. Avoid processed foods, trans and saturated fats.

## 2012-03-13 NOTE — Assessment & Plan Note (Addendum)
Encouraged heart healthy diet and increase exercise. Increased sleep is encouraged

## 2012-03-13 NOTE — Patient Instructions (Addendum)
Preventive Care for Adults, Female A healthy lifestyle and preventive care can promote health and wellness. Preventive health guidelines for women include the following key practices.  A routine yearly physical is a good way to check with your caregiver about your health and preventive screening. It is a chance to share any concerns and updates on your health, and to receive a thorough exam.  Visit your dentist for a routine exam and preventive care every 6 months. Brush your teeth twice a day and floss once a day. Good oral hygiene prevents tooth decay and gum disease.  The frequency of eye exams is based on your age, health, family medical history, use of contact lenses, and other factors. Follow your caregiver's recommendations for frequency of eye exams.  Eat a healthy diet. Foods like vegetables, fruits, whole grains, low-fat dairy products, and lean protein foods contain the nutrients you need without too many calories. Decrease your intake of foods high in solid fats, added sugars, and salt. Eat the right amount of calories for you.Get information about a proper diet from your caregiver, if necessary.  Regular physical exercise is one of the most important things you can do for your health. Most adults should get at least 150 minutes of moderate-intensity exercise (any activity that increases your heart rate and causes you to sweat) each week. In addition, most adults need muscle-strengthening exercises on 2 or more days a week.  Maintain a healthy weight. The body mass index (BMI) is a screening tool to identify possible weight problems. It provides an estimate of body fat based on height and weight. Your caregiver can help determine your BMI, and can help you achieve or maintain a healthy weight.For adults 20 years and older:  A BMI below 18.5 is considered underweight.  A BMI of 18.5 to 24.9 is normal.  A BMI of 25 to 29.9 is considered overweight.  A BMI of 30 and above is  considered obese.  Maintain normal blood lipids and cholesterol levels by exercising and minimizing your intake of saturated fat. Eat a balanced diet with plenty of fruit and vegetables. Blood tests for lipids and cholesterol should begin at age 20 and be repeated every 5 years. If your lipid or cholesterol levels are high, you are over 50, or you are at high risk for heart disease, you may need your cholesterol levels checked more frequently.Ongoing high lipid and cholesterol levels should be treated with medicines if diet and exercise are not effective.  If you smoke, find out from your caregiver how to quit. If you do not use tobacco, do not start.  If you are pregnant, do not drink alcohol. If you are breastfeeding, be very cautious about drinking alcohol. If you are not pregnant and choose to drink alcohol, do not exceed 1 drink per day. One drink is considered to be 12 ounces (355 mL) of beer, 5 ounces (148 mL) of wine, or 1.5 ounces (44 mL) of liquor.  Avoid use of street drugs. Do not share needles with anyone. Ask for help if you need support or instructions about stopping the use of drugs.  High blood pressure causes heart disease and increases the risk of stroke. Your blood pressure should be checked at least every 1 to 2 years. Ongoing high blood pressure should be treated with medicines if weight loss and exercise are not effective.  If you are 55 to 67 years old, ask your caregiver if you should take aspirin to prevent strokes.  Diabetes   screening involves taking a blood sample to check your fasting blood sugar level. This should be done once every 3 years, after age 45, if you are within normal weight and without risk factors for diabetes. Testing should be considered at a younger age or be carried out more frequently if you are overweight and have at least 1 risk factor for diabetes.  Breast cancer screening is essential preventive care for women. You should practice "breast  self-awareness." This means understanding the normal appearance and feel of your breasts and may include breast self-examination. Any changes detected, no matter how small, should be reported to a caregiver. Women in their 20s and 30s should have a clinical breast exam (CBE) by a caregiver as part of a regular health exam every 1 to 3 years. After age 40, women should have a CBE every year. Starting at age 40, women should consider having a mammography (breast X-ray test) every year. Women who have a family history of breast cancer should talk to their caregiver about genetic screening. Women at a high risk of breast cancer should talk to their caregivers about having magnetic resonance imaging (MRI) and a mammography every year.  The Pap test is a screening test for cervical cancer. A Pap test can show cell changes on the cervix that might become cervical cancer if left untreated. A Pap test is a procedure in which cells are obtained and examined from the lower end of the uterus (cervix).  Women should have a Pap test starting at age 21.  Between ages 21 and 29, Pap tests should be repeated every 2 years.  Beginning at age 30, you should have a Pap test every 3 years as long as the past 3 Pap tests have been normal.  Some women have medical problems that increase the chance of getting cervical cancer. Talk to your caregiver about these problems. It is especially important to talk to your caregiver if a new problem develops soon after your last Pap test. In these cases, your caregiver may recommend more frequent screening and Pap tests.  The above recommendations are the same for women who have or have not gotten the vaccine for human papillomavirus (HPV).  If you had a hysterectomy for a problem that was not cancer or a condition that could lead to cancer, then you no longer need Pap tests. Even if you no longer need a Pap test, a regular exam is a good idea to make sure no other problems are  starting.  If you are between ages 65 and 70, and you have had normal Pap tests going back 10 years, you no longer need Pap tests. Even if you no longer need a Pap test, a regular exam is a good idea to make sure no other problems are starting.  If you have had past treatment for cervical cancer or a condition that could lead to cancer, you need Pap tests and screening for cancer for at least 20 years after your treatment.  If Pap tests have been discontinued, risk factors (such as a new sexual partner) need to be reassessed to determine if screening should be resumed.  The HPV test is an additional test that may be used for cervical cancer screening. The HPV test looks for the virus that can cause the cell changes on the cervix. The cells collected during the Pap test can be tested for HPV. The HPV test could be used to screen women aged 30 years and older, and should   be used in women of any age who have unclear Pap test results. After the age of 30, women should have HPV testing at the same frequency as a Pap test.  Colorectal cancer can be detected and often prevented. Most routine colorectal cancer screening begins at the age of 50 and continues through age 75. However, your caregiver may recommend screening at an earlier age if you have risk factors for colon cancer. On a yearly basis, your caregiver may provide home test kits to check for hidden blood in the stool. Use of a small camera at the end of a tube, to directly examine the colon (sigmoidoscopy or colonoscopy), can detect the earliest forms of colorectal cancer. Talk to your caregiver about this at age 50, when routine screening begins. Direct examination of the colon should be repeated every 5 to 10 years through age 75, unless early forms of pre-cancerous polyps or small growths are found.  Hepatitis C blood testing is recommended for all people born from 1945 through 1965 and any individual with known risks for hepatitis C.  Practice  safe sex. Use condoms and avoid high-risk sexual practices to reduce the spread of sexually transmitted infections (STIs). STIs include gonorrhea, chlamydia, syphilis, trichomonas, herpes, HPV, and human immunodeficiency virus (HIV). Herpes, HIV, and HPV are viral illnesses that have no cure. They can result in disability, cancer, and death. Sexually active women aged 25 and younger should be checked for chlamydia. Older women with new or multiple partners should also be tested for chlamydia. Testing for other STIs is recommended if you are sexually active and at increased risk.  Osteoporosis is a disease in which the bones lose minerals and strength with aging. This can result in serious bone fractures. The risk of osteoporosis can be identified using a bone density scan. Women ages 65 and over and women at risk for fractures or osteoporosis should discuss screening with their caregivers. Ask your caregiver whether you should take a calcium supplement or vitamin D to reduce the rate of osteoporosis.  Menopause can be associated with physical symptoms and risks. Hormone replacement therapy is available to decrease symptoms and risks. You should talk to your caregiver about whether hormone replacement therapy is right for you.  Use sunscreen with sun protection factor (SPF) of 30 or more. Apply sunscreen liberally and repeatedly throughout the day. You should seek shade when your shadow is shorter than you. Protect yourself by wearing long sleeves, pants, a wide-brimmed hat, and sunglasses year round, whenever you are outdoors.  Once a month, do a whole body skin exam, using a mirror to look at the skin on your back. Notify your caregiver of new moles, moles that have irregular borders, moles that are larger than a pencil eraser, or moles that have changed in shape or color.  Stay current with required immunizations.  Influenza. You need a dose every fall (or winter). The composition of the flu vaccine  changes each year, so being vaccinated once is not enough.  Pneumococcal polysaccharide. You need 1 to 2 doses if you smoke cigarettes or if you have certain chronic medical conditions. You need 1 dose at age 65 (or older) if you have never been vaccinated.  Tetanus, diphtheria, pertussis (Tdap, Td). Get 1 dose of Tdap vaccine if you are younger than age 65, are over 65 and have contact with an infant, are a healthcare worker, are pregnant, or simply want to be protected from whooping cough. After that, you need a Td   booster dose every 10 years. Consult your caregiver if you have not had at least 3 tetanus and diphtheria-containing shots sometime in your life or have a deep or dirty wound.  HPV. You need this vaccine if you are a woman age 26 or younger. The vaccine is given in 3 doses over 6 months.  Measles, mumps, rubella (MMR). You need at least 1 dose of MMR if you were born in 1957 or later. You may also need a second dose.  Meningococcal. If you are age 19 to 21 and a first-year college student living in a residence hall, or have one of several medical conditions, you need to get vaccinated against meningococcal disease. You may also need additional booster doses.  Zoster (shingles). If you are age 60 or older, you should get this vaccine.  Varicella (chickenpox). If you have never had chickenpox or you were vaccinated but received only 1 dose, talk to your caregiver to find out if you need this vaccine.  Hepatitis A. You need this vaccine if you have a specific risk factor for hepatitis A virus infection or you simply wish to be protected from this disease. The vaccine is usually given as 2 doses, 6 to 18 months apart.  Hepatitis B. You need this vaccine if you have a specific risk factor for hepatitis B virus infection or you simply wish to be protected from this disease. The vaccine is given in 3 doses, usually over 6 months. Preventive Services / Frequency Ages 19 to 39  Blood  pressure check.** / Every 1 to 2 years.  Lipid and cholesterol check.** / Every 5 years beginning at age 20.  Clinical breast exam.** / Every 3 years for women in their 20s and 30s.  Pap test.** / Every 2 years from ages 21 through 29. Every 3 years starting at age 30 through age 65 or 70 with a history of 3 consecutive normal Pap tests.  HPV screening.** / Every 3 years from ages 30 through ages 65 to 70 with a history of 3 consecutive normal Pap tests.  Hepatitis C blood test.** / For any individual with known risks for hepatitis C.  Skin self-exam. / Monthly.  Influenza immunization.** / Every year.  Pneumococcal polysaccharide immunization.** / 1 to 2 doses if you smoke cigarettes or if you have certain chronic medical conditions.  Tetanus, diphtheria, pertussis (Tdap, Td) immunization. / A one-time dose of Tdap vaccine. After that, you need a Td booster dose every 10 years.  HPV immunization. / 3 doses over 6 months, if you are 26 and younger.  Measles, mumps, rubella (MMR) immunization. / You need at least 1 dose of MMR if you were born in 1957 or later. You may also need a second dose.  Meningococcal immunization. / 1 dose if you are age 19 to 21 and a first-year college student living in a residence hall, or have one of several medical conditions, you need to get vaccinated against meningococcal disease. You may also need additional booster doses.  Varicella immunization.** / Consult your caregiver.  Hepatitis A immunization.** / Consult your caregiver. 2 doses, 6 to 18 months apart.  Hepatitis B immunization.** / Consult your caregiver. 3 doses usually over 6 months. Ages 40 to 64  Blood pressure check.** / Every 1 to 2 years.  Lipid and cholesterol check.** / Every 5 years beginning at age 20.  Clinical breast exam.** / Every year after age 40.  Mammogram.** / Every year beginning at age 40   and continuing for as long as you are in good health. Consult with your  caregiver.  Pap test.** / Every 3 years starting at age 30 through age 65 or 70 with a history of 3 consecutive normal Pap tests.  HPV screening.** / Every 3 years from ages 30 through ages 65 to 70 with a history of 3 consecutive normal Pap tests.  Fecal occult blood test (FOBT) of stool. / Every year beginning at age 50 and continuing until age 75. You may not need to do this test if you get a colonoscopy every 10 years.  Flexible sigmoidoscopy or colonoscopy.** / Every 5 years for a flexible sigmoidoscopy or every 10 years for a colonoscopy beginning at age 50 and continuing until age 75.  Hepatitis C blood test.** / For all people born from 1945 through 1965 and any individual with known risks for hepatitis C.  Skin self-exam. / Monthly.  Influenza immunization.** / Every year.  Pneumococcal polysaccharide immunization.** / 1 to 2 doses if you smoke cigarettes or if you have certain chronic medical conditions.  Tetanus, diphtheria, pertussis (Tdap, Td) immunization.** / A one-time dose of Tdap vaccine. After that, you need a Td booster dose every 10 years.  Measles, mumps, rubella (MMR) immunization. / You need at least 1 dose of MMR if you were born in 1957 or later. You may also need a second dose.  Varicella immunization.** / Consult your caregiver.  Meningococcal immunization.** / Consult your caregiver.  Hepatitis A immunization.** / Consult your caregiver. 2 doses, 6 to 18 months apart.  Hepatitis B immunization.** / Consult your caregiver. 3 doses, usually over 6 months. Ages 65 and over  Blood pressure check.** / Every 1 to 2 years.  Lipid and cholesterol check.** / Every 5 years beginning at age 20.  Clinical breast exam.** / Every year after age 40.  Mammogram.** / Every year beginning at age 40 and continuing for as long as you are in good health. Consult with your caregiver.  Pap test.** / Every 3 years starting at age 30 through age 65 or 70 with a 3  consecutive normal Pap tests. Testing can be stopped between 65 and 70 with 3 consecutive normal Pap tests and no abnormal Pap or HPV tests in the past 10 years.  HPV screening.** / Every 3 years from ages 30 through ages 65 or 70 with a history of 3 consecutive normal Pap tests. Testing can be stopped between 65 and 70 with 3 consecutive normal Pap tests and no abnormal Pap or HPV tests in the past 10 years.  Fecal occult blood test (FOBT) of stool. / Every year beginning at age 50 and continuing until age 75. You may not need to do this test if you get a colonoscopy every 10 years.  Flexible sigmoidoscopy or colonoscopy.** / Every 5 years for a flexible sigmoidoscopy or every 10 years for a colonoscopy beginning at age 50 and continuing until age 75.  Hepatitis C blood test.** / For all people born from 1945 through 1965 and any individual with known risks for hepatitis C.  Osteoporosis screening.** / A one-time screening for women ages 65 and over and women at risk for fractures or osteoporosis.  Skin self-exam. / Monthly.  Influenza immunization.** / Every year.  Pneumococcal polysaccharide immunization.** / 1 dose at age 65 (or older) if you have never been vaccinated.  Tetanus, diphtheria, pertussis (Tdap, Td) immunization. / A one-time dose of Tdap vaccine if you are over   65 and have contact with an infant, are a healthcare worker, or simply want to be protected from whooping cough. After that, you need a Td booster dose every 10 years.  Varicella immunization.** / Consult your caregiver.  Meningococcal immunization.** / Consult your caregiver.  Hepatitis A immunization.** / Consult your caregiver. 2 doses, 6 to 18 months apart.  Hepatitis B immunization.** / Check with your caregiver. 3 doses, usually over 6 months. ** Family history and personal history of risk and conditions may change your caregiver's recommendations. Document Released: 04/18/2001 Document Revised: 05/15/2011  Document Reviewed: 07/18/2010 [employer] Patient Information 2013 [employer].  

## 2012-03-13 NOTE — Assessment & Plan Note (Signed)
Omeprazole controls her symptoms mostly but she does have epigastric discomfort at times she thinks is weight related. She is encouraged to eat small meals and avoid fatty, spicy, acidic foods. Notify us if symptoms worsen.

## 2012-03-13 NOTE — Assessment & Plan Note (Signed)
Multifactorial. Combination of chlorthalidone and [place], excessive caffeine use. Encouraged decreased caffeine and increasing clear fluids. No change in meds

## 2012-04-04 ENCOUNTER — Other Ambulatory Visit: Payer: Medicare Other

## 2012-04-05 ENCOUNTER — Other Ambulatory Visit: Payer: Medicare Other

## 2012-04-05 ENCOUNTER — Ambulatory Visit (INDEPENDENT_AMBULATORY_CARE_PROVIDER_SITE_OTHER): Payer: Medicare Other

## 2012-04-05 VITALS — BP 128/67 | HR 66

## 2012-04-05 DIAGNOSIS — I10 Essential (primary) hypertension: Secondary | ICD-10-CM

## 2012-04-25 ENCOUNTER — Ambulatory Visit (INDEPENDENT_AMBULATORY_CARE_PROVIDER_SITE_OTHER): Payer: Medicare Other | Admitting: Family Medicine

## 2012-04-25 ENCOUNTER — Encounter: Payer: Self-pay | Admitting: Family Medicine

## 2012-04-25 VITALS — BP 160/72 | HR 60 | Temp 98.1°F | Resp 16 | Ht 66.0 in | Wt 178.0 lb

## 2012-04-25 DIAGNOSIS — I10 Essential (primary) hypertension: Secondary | ICD-10-CM

## 2012-04-25 DIAGNOSIS — R252 Cramp and spasm: Secondary | ICD-10-CM

## 2012-04-25 DIAGNOSIS — E785 Hyperlipidemia, unspecified: Secondary | ICD-10-CM

## 2012-04-25 DIAGNOSIS — J40 Bronchitis, not specified as acute or chronic: Secondary | ICD-10-CM

## 2012-04-25 DIAGNOSIS — J209 Acute bronchitis, unspecified: Secondary | ICD-10-CM

## 2012-04-25 LAB — MAGNESIUM: Magnesium: 1.9 mg/dL (ref 1.5–2.5)

## 2012-04-25 MED ORDER — AZITHROMYCIN 250 MG PO TABS: ORAL_TABLET | ORAL | Status: DC

## 2012-04-25 MED ORDER — LISINOPRIL 10 MG PO TABS: 10.0000 mg | ORAL_TABLET | Freq: Two times a day (BID) | ORAL | Status: DC

## 2012-04-25 NOTE — Patient Instructions (Addendum)
  4-8 week nurse bp check and renal panel  6-8 months annual exam with labs prior lipid, renal, cbc, mag, tsh, hepatic   Bronchitis Bronchitis is the body's way of reacting to injury and/or infection (inflammation) of the bronchi. Bronchi are the air tubes that extend from the windpipe into the lungs. If the inflammation becomes severe, it may cause shortness of breath. CAUSES  Inflammation may be caused by:  A virus.  Germs (bacteria).  Dust.  Allergens.  Pollutants and many other irritants. The cells lining the bronchial tree are covered with tiny hairs (cilia). These constantly beat upward, away from the lungs, toward the mouth. This keeps the lungs free of pollutants. When these cells become too irritated and are unable to do their job, mucus begins to develop. This causes the characteristic cough of bronchitis. The cough clears the lungs when the cilia are unable to do their job. Without either of these protective mechanisms, the mucus would settle in the lungs. Then you would develop pneumonia. Smoking is a common cause of bronchitis and can contribute to pneumonia. Stopping this habit is the single most important thing you can do to help yourself. TREATMENT   Your caregiver may prescribe an antibiotic if the cough is caused by bacteria. Also, medicines that open up your airways make it easier to breathe. Your caregiver may also recommend or prescribe an expectorant. It will loosen the mucus to be coughed up. Only take over-the-counter or prescription medicines for pain, discomfort, or fever as directed by your caregiver.  Removing whatever causes the problem (smoking, for example) is critical to preventing the problem from getting worse.  Cough suppressants may be prescribed for relief of cough symptoms.  Inhaled medicines may be prescribed to help with symptoms now and to help prevent problems from returning.  For those with recurrent (chronic) bronchitis, there may be a need  for steroid medicines. SEEK IMMEDIATE MEDICAL CARE IF:   During treatment, you develop more pus-like mucus (purulent sputum).  You have a fever.  Your baby is older than 3 months with a rectal temperature of 102 F (38.9 C) or higher.  Your baby is 65 months old or younger with a rectal temperature of 100.4 F (38 C) or higher.  You become progressively more ill.  You have increased difficulty breathing, wheezing, or shortness of breath. It is necessary to seek immediate medical care if you are elderly or sick from any other disease. MAKE SURE YOU:   Understand these instructions.  Will watch your condition.  Will get help right away if you are not doing well or get worse. Document Released: 02/20/2005 Document Revised: 05/15/2011 Document Reviewed: 12/31/2007 [facility] Patient Information 2013 [place], [place].

## 2012-04-28 NOTE — Assessment & Plan Note (Signed)
Avoid trans fats, continue krill oil caps,

## 2012-04-28 NOTE — Assessment & Plan Note (Signed)
zpak is prescribed, start probiotics and mucinex, incrase rest and hdyration

## 2012-04-28 NOTE — Progress Notes (Signed)
Patient ID: Erika [name], female   DOB: [birth year], 67 y.o.   MRN: [id number] EARLEAN FIDALGO [id number] 03/11/1945 04/28/2012      Progress Note-Follow Up  Subjective  Chief Complaint  Chief Complaint  Patient presents with  . Hypertension    Pt here for follow up.  . Cough    Pt reports cough x 2 weeks. Has post nasal drip and nasal congestion.    HPI  Patient is a 63 we'll Caucasian female who is in today for she generally says she's been feeling well although she has been struggling with cold symptoms for the last few weeks. She has some mild congestion and a persistent cough. The cough is often keeping her up at night. She notes occasional leg cramps as well. Denies any GI or GU complaints. No palpitations or shortness of breath. No ear pain or significant headache. No sore throat.  Past Medical History  Diagnosis Date  . Diverticulosis of colon     colonoscopy 04/14/1999  . Obesity   . Chicken pox as a child  . Measles as a child  . Mumps as a child  . GERD (gastroesophageal reflux disease)   . Hyperlipidemia   . Hypertension   . Osteopenia   . History of fibrocystic disease of breast   . Bronchitis 04/02/2011  . Arthritis 04/02/2011  . IBS (irritable bowel syndrome) 04/02/2011  . Overactive bladder 04/02/2011  . Left shoulder strain 06/14/2011  . Dry mouth 03/13/2012  . Preventative health care 03/13/2012    Past Surgical History  Procedure Laterality Date  . Breast reduction surgery  1986  . Bunion repair  1980    on both feet    Family History  Problem Relation Age of Onset  . Stroke Mother 42  . Hyperlipidemia Mother   . Hypertension Mother   . Heart attack Father 27  . Hypertension Father   . Heart disease Father   . Hypertension Brother   . Aneurysm Brother     aortic  . Hyperlipidemia Brother   . Heart disease Brother     aortic aneurysm rupture  . Multiple sclerosis Daughter   . Alcohol abuse Son   . Schizophrenia Son   . Bipolar disorder Son   .  Mental illness Son     schizophrenic, bipolar    History   Social History  . Marital Status: Married    Spouse Name: N/A    Number of Children: N/A  . Years of Education: N/A   Occupational History  . Not on file.   Social History Main Topics  . Smoking status: Never Smoker   . Smokeless tobacco: Never Used  . Alcohol Use: No  . Drug Use: No  . Sexually Active: Yes   Other Topics Concern  . Not on file   Social History Narrative  . No narrative on file    Current Outpatient Prescriptions on File Prior to Visit  Medication Sig Dispense Refill  . aspirin 81 MG tablet Take 81 mg by mouth daily.        Marland Kitchen BYSTOLIC 10 MG tablet TAKE 1 TABLET EVERY DAY  30 tablet  5  . calcium carbonate (TUMS) 500 MG chewable tablet as needed.        . chlorpheniramine (CHLOR-TRIMETON) 4 MG tablet Take 4 mg by mouth daily as needed.       . chlorthalidone (HYGROTON) 25 MG tablet Take 1 tablet (25 mg total) by mouth daily.  30  tablet  3  . cycloSPORINE (RESTASIS) 0.05 % ophthalmic emulsion Place 1 drop into both eyes 2 (two) times daily.      Marland Kitchen dextromethorphan-guaiFENesin (MUCINEX DM) 30-600 MG per 12 hr tablet Take 1 to 2 tabs every 12 hours as needed       . diphenhydramine-acetaminophen (TYLENOL PM) 25-500 MG TABS Take 1 tablet by mouth at bedtime as needed.        . Multiple Minerals-Vitamins (CITRACAL PLUS PO) Take 1 tablet by mouth daily.        . Multiple Vitamin (MULTIVITAMIN) capsule Take 1 capsule by mouth daily.        . nisoldipine (SULAR) 34 MG 24 hr tablet Take 1 tablet (34 mg total) by mouth daily.  30 tablet  2  . omeprazole (PRILOSEC) 20 MG capsule Take 20 mg by mouth daily.        Marland Kitchen OVER THE COUNTER MEDICATION MegaRed- 1 daily      . trospium (SANCTURA) 20 MG tablet Take 20 mg by mouth daily.        . cetirizine (ZYRTEC) 10 MG tablet Take 10 mg by mouth at bedtime as needed.         No current facility-administered medications on file prior to visit.    No Known  Allergies  Review of Systems  Review of Systems  Constitutional: Positive for malaise/fatigue. Negative for fever.  HENT: Positive for congestion.   Eyes: Negative for discharge.  Respiratory: Positive for cough and sputum production. Negative for shortness of breath.   Cardiovascular: Negative for chest pain, palpitations and leg swelling.  Gastrointestinal: Negative for nausea, abdominal pain and diarrhea.  Genitourinary: Negative for dysuria.  Musculoskeletal: Negative for falls.  Skin: Negative for rash.  Neurological: Negative for loss of consciousness and headaches.  Endo/Heme/Allergies: Negative for polydipsia.  Psychiatric/Behavioral: Negative for depression and suicidal ideas. The patient is not nervous/anxious and does not have insomnia.     Objective  BP 160/72  Pulse 60  Temp(Src) 98.1 F (36.7 C) (Oral)  Resp 16  Ht 5\' 6"  (1.676 m)  Wt 178 lb 0.6 oz (80.758 kg)  BMI 28.75 kg/m2  SpO2 98%  Physical Exam  Physical Exam  Constitutional: She is oriented to person, place, and time and well-developed, well-nourished, and in no distress. No distress.  HENT:  Head: Normocephalic and atraumatic.  Eyes: Conjunctivae are normal.  Neck: Neck supple. No thyromegaly present.  Cardiovascular: Normal rate, regular rhythm and normal heart sounds.   No murmur heard. Pulmonary/Chest: Effort normal and breath sounds normal. She has no wheezes.  Abdominal: She exhibits no distension and no mass.  Musculoskeletal: She exhibits no edema.  Lymphadenopathy:    She has no cervical adenopathy.  Neurological: She is alert and oriented to person, place, and time.  Skin: Skin is warm and dry. No rash noted. She is not diaphoretic.  Psychiatric: Memory, affect and judgment normal.    Lab Results  Component Value Date   TSH 1.02 03/13/2012   Lab Results  Component Value Date   WBC 4.3* 03/13/2012   HGB 13.6 03/13/2012   HCT 39.9 03/13/2012   MCV 91.2 03/13/2012   PLT 279.0 03/13/2012    Lab Results  Component Value Date   CREATININE 0.8 03/13/2012   BUN 9 03/13/2012   NA 138 03/13/2012   K 3.3* 04/25/2012   CL 99 03/13/2012   CO2 32 03/13/2012   Lab Results  Component Value Date   ALT 20  03/13/2012   AST 21 03/13/2012   ALKPHOS 65 03/13/2012   BILITOT 0.8 03/13/2012   Lab Results  Component Value Date   CHOL 246* 03/13/2012   Lab Results  Component Value Date   HDL 33.10* 03/13/2012   Lab Results  Component Value Date   LDLCALC 135* 01/13/2009   Lab Results  Component Value Date   TRIG 136.0 03/13/2012   Lab Results  Component Value Date   CHOLHDL 7 03/13/2012     Assessment & Plan  Hypertension Poor control increase Lisinopril to bid and continue other meds, minimize sodium  HYPERLIPIDEMIA Avoid trans fats, continue krill oil caps,   Bronchitis zpak is prescribed, start probiotics and mucinex, incrase rest and hdyration

## 2012-04-28 NOTE — Assessment & Plan Note (Signed)
Poor control increase Lisinopril to bid and continue other meds, minimize sodium

## 2012-06-24 ENCOUNTER — Encounter: Payer: Self-pay | Admitting: Family Medicine

## 2012-06-24 ENCOUNTER — Ambulatory Visit (INDEPENDENT_AMBULATORY_CARE_PROVIDER_SITE_OTHER): Payer: Medicare Other | Admitting: Family Medicine

## 2012-06-24 VITALS — BP 158/72 | HR 59 | Temp 98.1°F | Ht 66.0 in | Wt 178.1 lb

## 2012-06-24 DIAGNOSIS — T7840XA Allergy, unspecified, initial encounter: Secondary | ICD-10-CM

## 2012-06-24 DIAGNOSIS — E876 Hypokalemia: Secondary | ICD-10-CM

## 2012-06-24 DIAGNOSIS — E785 Hyperlipidemia, unspecified: Secondary | ICD-10-CM

## 2012-06-24 DIAGNOSIS — I10 Essential (primary) hypertension: Secondary | ICD-10-CM

## 2012-06-24 HISTORY — DX: Allergy, unspecified, initial encounter: T78.40XA

## 2012-06-24 HISTORY — DX: Hypokalemia: E87.6

## 2012-06-24 LAB — RENAL FUNCTION PANEL
Calcium: 9.6 mg/dL (ref 8.4–10.5)
Glucose, Bld: 88 mg/dL (ref 70–99)
Potassium: 4.1 mEq/L (ref 3.5–5.3)
Sodium: 139 mEq/L (ref 135–145)

## 2012-06-24 NOTE — Assessment & Plan Note (Signed)
Patient did not take BP today. Will check a renal panel today and recheck with meds at next visit

## 2012-06-24 NOTE — Assessment & Plan Note (Signed)
Recheck renal panel,

## 2012-06-24 NOTE — Progress Notes (Signed)
Patient ID: Erika [name], female   DOB: March 17, 1945, 67 y.o.   MRN: [id number] Erika [name] [id number] 1945-07-20 06/24/2012      Progress Note-Follow Up  Subjective  Chief Complaint  Chief Complaint  Patient presents with  . Follow-up    BP    HPI  Patient is a 58 we'll Caucasian female who is in today for followup. She is generally doing well. She does know recently having some mild trouble with allergies and sinus congestion. As a result she's had some low-grade headaches but no neurologic complaints. No photophobia or phonophobia. Notes recently checking her blood sugar at [facility] and getting 123/64. Denies any recent illness. No fevers or chills. No chest pain, palpitations, shortness of breath, GI or GU complaints noted.  Past Medical History  Diagnosis Date  . Diverticulosis of colon     colonoscopy 04/14/1999  . Obesity   . Chicken pox as a child  . Measles as a child  . Mumps as a child  . GERD (gastroesophageal reflux disease)   . Hyperlipidemia   . Hypertension   . Osteopenia   . History of fibrocystic disease of breast   . Bronchitis 04/02/2011  . Arthritis 04/02/2011  . IBS (irritable bowel syndrome) 04/02/2011  . Overactive bladder 04/02/2011  . Left shoulder strain 06/14/2011  . Dry mouth 03/13/2012  . Preventative health care 03/13/2012  . Allergic state 06/24/2012  . Hypokalemia 06/24/2012    Past Surgical History  Procedure Laterality Date  . Breast reduction surgery  1986  . Bunion repair  1980    on both feet    Family History  Problem Relation Age of Onset  . Stroke Mother 41  . Hyperlipidemia Mother   . Hypertension Mother   . Heart attack Father 84  . Hypertension Father   . Heart disease Father   . Hypertension Brother   . Aneurysm Brother     aortic  . Hyperlipidemia Brother   . Heart disease Brother     aortic aneurysm rupture  . Multiple sclerosis Daughter   . Alcohol abuse Son   . Schizophrenia Son   . Bipolar disorder Son   . Mental  illness Son     schizophrenic, bipolar    History   Social History  . Marital Status: Married    Spouse Name: N/A    Number of Children: N/A  . Years of Education: N/A   Occupational History  . Not on file.   Social History Main Topics  . Smoking status: Never Smoker   . Smokeless tobacco: Never Used  . Alcohol Use: No  . Drug Use: No  . Sexually Active: Yes   Other Topics Concern  . Not on file   Social History Narrative  . No narrative on file    Current Outpatient Prescriptions on File Prior to Visit  Medication Sig Dispense Refill  . aspirin 81 MG tablet Take 81 mg by mouth daily.        Marland Kitchen azithromycin (ZITHROMAX) 250 MG tablet 2 tabs po once and then 1 tab po dailyt  X 4 days  6 tablet  0  . BYSTOLIC 10 MG tablet TAKE 1 TABLET EVERY DAY  30 tablet  5  . calcium carbonate (TUMS) 500 MG chewable tablet as needed.        . cetirizine (ZYRTEC) 10 MG tablet Take 10 mg by mouth at bedtime as needed.        . chlorpheniramine (CHLOR-TRIMETON)  4 MG tablet Take 4 mg by mouth daily as needed.       . chlorthalidone (HYGROTON) 25 MG tablet Take 1 tablet (25 mg total) by mouth daily.  30 tablet  3  . cycloSPORINE (RESTASIS) 0.05 % ophthalmic emulsion Place 1 drop into both eyes 2 (two) times daily.      Marland Kitchen dextromethorphan-guaiFENesin (MUCINEX DM) 30-600 MG per 12 hr tablet Take 1 to 2 tabs every 12 hours as needed       . diphenhydramine-acetaminophen (TYLENOL PM) 25-500 MG TABS Take 1 tablet by mouth at bedtime as needed.        Marland Kitchen lisinopril (PRINIVIL,ZESTRIL) 10 MG tablet Take 1 tablet (10 mg total) by mouth 2 (two) times daily.  180 tablet  3  . Multiple Minerals-Vitamins (CITRACAL PLUS PO) Take 1 tablet by mouth daily.        . Multiple Vitamin (MULTIVITAMIN) capsule Take 1 capsule by mouth daily.        . nisoldipine (SULAR) 34 MG 24 hr tablet Take 1 tablet (34 mg total) by mouth daily.  30 tablet  2  . omeprazole (PRILOSEC) 20 MG capsule Take 20 mg by mouth daily.         Marland Kitchen OVER THE COUNTER MEDICATION MegaRed- 1 daily      . trospium (SANCTURA) 20 MG tablet Take 20 mg by mouth daily.         No current facility-administered medications on file prior to visit.    No Known Allergies  Review of Systems  Review of Systems  Constitutional: Negative for fever and malaise/fatigue.  HENT: Positive for congestion.   Eyes: Negative for discharge.  Respiratory: Negative for shortness of breath.   Cardiovascular: Negative for chest pain, palpitations and leg swelling.  Gastrointestinal: Negative for nausea, abdominal pain and diarrhea.  Genitourinary: Negative for dysuria.  Musculoskeletal: Negative for falls.  Skin: Negative for rash.  Neurological: Positive for headaches. Negative for loss of consciousness.  Endo/Heme/Allergies: Negative for polydipsia.  Psychiatric/Behavioral: Negative for depression and suicidal ideas. The patient is not nervous/anxious and does not have insomnia.     Objective  BP 158/72  Pulse 59  Temp(Src) 98.1 F (36.7 C) (Oral)  Ht 5\' 6"  (1.676 m)  Wt 178 lb 1.9 oz (80.795 kg)  BMI 28.76 kg/m2  SpO2 95%  Physical Exam  Physical Exam  Constitutional: She is oriented to person, place, and time and well-developed, well-nourished, and in no distress. No distress.  HENT:  Head: Normocephalic and atraumatic.  Eyes: Conjunctivae are normal.  Neck: Neck supple. No thyromegaly present.  Cardiovascular: Normal rate, regular rhythm and normal heart sounds.   No murmur heard. Pulmonary/Chest: Effort normal and breath sounds normal. She has no wheezes.  Abdominal: She exhibits no distension and no mass.  Musculoskeletal: She exhibits no edema.  Lymphadenopathy:    She has no cervical adenopathy.  Neurological: She is alert and oriented to person, place, and time.  Skin: Skin is warm and dry. No rash noted. She is not diaphoretic.  Psychiatric: Memory, affect and judgment normal.    Lab Results  Component Value Date   TSH  1.02 03/13/2012   Lab Results  Component Value Date   WBC 4.3* 03/13/2012   HGB 13.6 03/13/2012   HCT 39.9 03/13/2012   MCV 91.2 03/13/2012   PLT 279.0 03/13/2012   Lab Results  Component Value Date   CREATININE 0.8 03/13/2012   BUN 9 03/13/2012   NA 138 03/13/2012  K 3.3* 04/25/2012   CL 99 03/13/2012   CO2 32 03/13/2012   Lab Results  Component Value Date   ALT 20 03/13/2012   AST 21 03/13/2012   ALKPHOS 65 03/13/2012   BILITOT 0.8 03/13/2012   Lab Results  Component Value Date   CHOL 246* 03/13/2012   Lab Results  Component Value Date   HDL 33.10* 03/13/2012   Lab Results  Component Value Date   LDLCALC 135* 01/13/2009   Lab Results  Component Value Date   TRIG 136.0 03/13/2012   Lab Results  Component Value Date   CHOLHDL 7 03/13/2012     Assessment & Plan  Allergic state Try Zyrtec daily  Hypertension Patient did not take BP today. Will check a renal panel today and recheck with meds at next visit  Hypokalemia Recheck renal panel,  HYPERLIPIDEMIA Has started red yeast rice, recheck lipids at next visit

## 2012-06-24 NOTE — Patient Instructions (Addendum)
Try Zyrtec or Claritin daily for a week or so to see if it helps  Labs prior to next visit, lipid, renal, cbc, tsh, hepatic  Allergies, Generic Allergies may happen from anything your body is sensitive to. This may be food, medicines, pollens, chemicals, and nearly anything around you in everyday life that produces allergens. An allergen is anything that causes an allergy producing substance. Heredity is often a factor in causing these problems. This means you may have some of the same allergies as your parents. Food allergies happen in all age groups. Food allergies are some of the most severe and life threatening. Some common food allergies are cow's milk, seafood, eggs, nuts, wheat, and soybeans. SYMPTOMS   Swelling around the mouth.  An itchy red rash or hives.  Vomiting or diarrhea.  Difficulty breathing. SEVERE ALLERGIC REACTIONS ARE LIFE-THREATENING. This reaction is called anaphylaxis. It can cause the mouth and throat to swell and cause difficulty with breathing and swallowing. In severe reactions only a trace amount of food (for example, peanut oil in a salad) may cause death within seconds. Seasonal allergies occur in all age groups. These are seasonal because they usually occur during the same season every year. They may be a reaction to molds, grass pollens, or tree pollens. Other causes of problems are house dust mite allergens, pet dander, and mold spores. The symptoms often consist of nasal congestion, a runny itchy nose associated with sneezing, and tearing itchy eyes. There is often an associated itching of the mouth and ears. The problems happen when you come in contact with pollens and other allergens. Allergens are the particles in the air that the body reacts to with an allergic reaction. This causes you to release allergic antibodies. Through a chain of events, these eventually cause you to release histamine into the blood stream. Although it is meant to be protective to the  body, it is this release that causes your discomfort. This is why you were given anti-histamines to feel better. If you are unable to pinpoint the offending allergen, it may be determined by skin or blood testing. Allergies cannot be cured but can be controlled with medicine. Hay fever is a collection of all or some of the seasonal allergy problems. It may often be treated with simple over-the-counter medicine such as diphenhydramine. Take medicine as directed. Do not drink alcohol or drive while taking this medicine. Check with your caregiver or package insert for child dosages. If these medicines are not effective, there are many new medicines your caregiver can prescribe. Stronger medicine such as nasal spray, eye drops, and corticosteroids may be used if the first things you try do not work well. Other treatments such as immunotherapy or desensitizing injections can be used if all else fails. Follow up with your caregiver if problems continue. These seasonal allergies are usually not life threatening. They are generally more of a nuisance that can often be handled using medicine. HOME CARE INSTRUCTIONS   If unsure what causes a reaction, keep a diary of foods eaten and symptoms that follow. Avoid foods that cause reactions.  If hives or rash are present:  Take medicine as directed.  You may use an over-the-counter antihistamine (diphenhydramine) for hives and itching as needed.  Apply cold compresses (cloths) to the skin or take baths in cool water. Avoid hot baths or showers. Heat will make a rash and itching worse.  If you are severely allergic:  Following a treatment for a severe reaction, hospitalization is  often required for closer follow-up.  Wear a medic-alert bracelet or necklace stating the allergy.  You and your family must learn how to give adrenaline or use an anaphylaxis kit.  If you have had a severe reaction, always carry your anaphylaxis kit or EpiPen with you. Use this  medicine as directed by your caregiver if a severe reaction is occurring. Failure to do so could have a fatal outcome. SEEK MEDICAL CARE IF:  You suspect a food allergy. Symptoms generally happen within 30 minutes of eating a food.  Your symptoms have not gone away within 2 days or are getting worse.  You develop new symptoms.  You want to retest yourself or your child with a food or drink you think causes an allergic reaction. Never do this if an anaphylactic reaction to that food or drink has happened before. Only do this under the care of a caregiver. SEEK IMMEDIATE MEDICAL CARE IF:   You have difficulty breathing, are wheezing, or have a tight feeling in your chest or throat.  You have a swollen mouth, or you have hives, swelling, or itching all over your body.  You have had a severe reaction that has responded to your anaphylaxis kit or an EpiPen. These reactions may return when the medicine has worn off. These reactions should be considered life threatening. MAKE SURE YOU:   Understand these instructions.  Will watch your condition.  Will get help right away if you are not doing well or get worse. Document Released: 05/16/2002 Document Revised: 05/15/2011 Document Reviewed: 10/21/2007 [facility] Patient Information 2013 [place], [place].

## 2012-06-24 NOTE — Assessment & Plan Note (Signed)
Has started red yeast rice, recheck lipids at next visit

## 2012-06-24 NOTE — Assessment & Plan Note (Signed)
Try Zyrtec daily.

## 2012-07-23 ENCOUNTER — Other Ambulatory Visit: Payer: Self-pay | Admitting: Family Medicine

## 2012-07-31 ENCOUNTER — Telehealth: Payer: Self-pay

## 2012-07-31 NOTE — Telephone Encounter (Signed)
Pt left a vm stating that this past [place]? Pt stated she banged up her hips and legs and there is 1 big bruise from hip to knee with a big knot in the middle that is painful? Please advise?  Pt would like to know if she should be proactive or is it going to take awhile to heal? Should she take aspirin  I tried to call pt to see if there is swelling or fever on leg but had to leave a message for pt to return my call

## 2012-07-31 NOTE — Telephone Encounter (Signed)
Left a message for pt to return my call. 

## 2012-07-31 NOTE — Telephone Encounter (Signed)
Patient informed and voiced understanding

## 2012-07-31 NOTE — Telephone Encounter (Signed)
Fine to take a ECASA 81 mg daily. Apply ice tid for first 2-3 days then apply moist heat tid after that. Can massage the sore spot with Aspercreme twice a day. If it gets greatly painful can get it looked at.

## 2012-08-08 ENCOUNTER — Ambulatory Visit (INDEPENDENT_AMBULATORY_CARE_PROVIDER_SITE_OTHER): Payer: Medicare Other | Admitting: Family Medicine

## 2012-08-08 ENCOUNTER — Ambulatory Visit (HOSPITAL_BASED_OUTPATIENT_CLINIC_OR_DEPARTMENT_OTHER)
Admission: RE | Admit: 2012-08-08 | Discharge: 2012-08-08 | Disposition: A | Discharge status: Home / Self Care | Payer: Medicare Other | Source: Ambulatory Visit | Attending: Family Medicine | Admitting: Family Medicine

## 2012-08-08 ENCOUNTER — Other Ambulatory Visit: Payer: Self-pay | Admitting: Family Medicine

## 2012-08-08 ENCOUNTER — Encounter: Payer: Self-pay | Admitting: Family Medicine

## 2012-08-08 VITALS — BP 132/62 | HR 55 | Temp 98.2°F | Ht 66.0 in | Wt 180.1 lb

## 2012-08-08 DIAGNOSIS — S7010XA Contusion of unspecified thigh, initial encounter: Secondary | ICD-10-CM | POA: Insufficient documentation

## 2012-08-08 DIAGNOSIS — I10 Essential (primary) hypertension: Secondary | ICD-10-CM

## 2012-08-08 DIAGNOSIS — X58XXXA Exposure to other specified factors, initial encounter: Secondary | ICD-10-CM | POA: Insufficient documentation

## 2012-08-08 DIAGNOSIS — K589 Irritable bowel syndrome without diarrhea: Secondary | ICD-10-CM

## 2012-08-08 DIAGNOSIS — M79605 Pain in left leg: Secondary | ICD-10-CM

## 2012-08-08 DIAGNOSIS — S8012XA Contusion of left lower leg, initial encounter: Secondary | ICD-10-CM | POA: Insufficient documentation

## 2012-08-08 DIAGNOSIS — S8010XA Contusion of unspecified lower leg, initial encounter: Secondary | ICD-10-CM

## 2012-08-08 DIAGNOSIS — M79609 Pain in unspecified limb: Secondary | ICD-10-CM

## 2012-08-08 DIAGNOSIS — M25559 Pain in unspecified hip: Secondary | ICD-10-CM | POA: Insufficient documentation

## 2012-08-08 LAB — RENAL FUNCTION PANEL
Albumin: 4 g/dL (ref 3.5–5.2)
BUN: 10 mg/dL (ref 6–23)
Calcium: 10 mg/dL (ref 8.4–10.5)
Creat: 0.9 mg/dL (ref 0.50–1.10)
Glucose, Bld: 93 mg/dL (ref 70–99)
Phosphorus: 3 mg/dL (ref 2.3–4.6)

## 2012-08-08 NOTE — Progress Notes (Signed)
Quick Note:  Patient Informed and voiced understanding ______ 

## 2012-08-08 NOTE — Assessment & Plan Note (Signed)
Numbers much better with increased Lisinopril check a renal today

## 2012-08-08 NOTE — Patient Instructions (Addendum)
Next visit annual labs prior lipid, renal, cbc, tsh, hepatic  Hematoma A hematoma is a pocket of blood that collects under the skin, in an organ, in a body space, in a joint space, or in other tissue. The blood can clot to form a lump that you can see and feel. The lump is often firm, sore, and sometimes even painful and tender. Most hematomas get better in a few days to weeks. However, some hematomas may be serious and require medical care.Hematomas can range in size from very small to very large. CAUSES  A hematoma can be caused by a blunt or penetrating injury. It can also be caused by leakage from a blood vessel under the skin. Spontaneous leakage from a blood vessel is more likely to occur in elderly people, especially those taking blood thinners. Sometimes, a hematoma can develop after certain medical procedures. SYMPTOMS  Unlike a bruise, a hematoma forms a firm lump that you can feel. This lump is the collection of blood. The collection of blood can also cause your skin to turn a blue to dark blue color. If the hematoma is close to the surface of the skin, it often produces a yellowish color in the skin. DIAGNOSIS  Your caregiver can determine whether you have a hematoma based on your history and a physical exam. TREATMENT  Hematomas usually go away on their own over time. Rarely does the blood need to be drained out of the body. HOME CARE INSTRUCTIONS   Put ice on the injured area.  Put ice in a plastic bag.  Place a towel between your skin and the bag.  Leave the ice on for 15-20 minutes, 3-4 times a day for the first 1 to 2 days.  After the first 2 days, switch to using warm compresses on the hematoma.  Elevate the injured area to help decrease pain and swelling. Wrapping the area with an elastic bandage may also be helpful. Compression helps to reduce swelling and promotes shrinking of the hematoma. Make sure the bandage is not wrapped too tight.  If your hematoma is on a lower  extremity and is painful, crutches may be helpful for a couple days.  Only take over-the-counter or prescription medicines for pain, discomfort, or fever as directed by your caregiver. Most patients can take acetaminophen or ibuprofen for the pain. SEEK IMMEDIATE MEDICAL CARE IF:   You have increasing pain, or your pain is not controlled with medicine.  You have a fever.  You have worsening swelling or discoloration.  Your skin over the hematoma breaks or starts bleeding. MAKE SURE YOU:   Understand these instructions.  Will watch your condition.  Will get help right away if you are not doing well or get worse. Document Released: 10/05/2003 Document Revised: 05/15/2011 Document Reviewed: 10/24/2010 [facility] Patient Information 2014 [place], [place].

## 2012-08-08 NOTE — Progress Notes (Signed)
Patient ID: Erika [name], female   DOB: 26-Jun-1945, 67 y.o.   MRN: [id number] Erika [name] [id number] 1945/06/07 08/08/2012      Progress Note-Follow Up  Subjective  Chief Complaint  Chief Complaint  Patient presents with  . Follow-up    6 week    HPI  Patient is a 67 year old Caucasian female in today for followup and evaluation of a contusion on her left leg. She failed to appear 2 weeks ago and suffered a large contusion on her left lateral thigh and is having some persistent hip pain. No weakness or falls. She is able to bear weight. No shortness of breath, chest pain or palpitations. No GI or GU complaints. She is taking her medications as prescribed otherwise is doing well.  Past Medical History  Diagnosis Date  . Diverticulosis of colon     colonoscopy 04/14/1999  . Obesity   . Chicken pox as a child  . Measles as a child  . Mumps as a child  . GERD (gastroesophageal reflux disease)   . Hyperlipidemia   . Hypertension   . Osteopenia   . History of fibrocystic disease of breast   . Bronchitis 04/02/2011  . Arthritis 04/02/2011  . IBS (irritable bowel syndrome) 04/02/2011  . Overactive bladder 04/02/2011  . Left shoulder strain 06/14/2011  . Dry mouth 03/13/2012  . Preventative health care 03/13/2012  . Allergic state 06/24/2012  . Hypokalemia 06/24/2012    Past Surgical History  Procedure Laterality Date  . Breast reduction surgery  1986  . Bunion repair  1980    on both feet    Family History  Problem Relation Age of Onset  . Stroke Mother 64  . Hyperlipidemia Mother   . Hypertension Mother   . Heart attack Father 81  . Hypertension Father   . Heart disease Father   . Hypertension Brother   . Aneurysm Brother     aortic  . Hyperlipidemia Brother   . Heart disease Brother     aortic aneurysm rupture  . Multiple sclerosis Daughter   . Alcohol abuse Son   . Schizophrenia Son   . Bipolar disorder Son   . Mental illness Son     schizophrenic, bipolar     History   Social History  . Marital Status: Married    Spouse Name: N/A    Number of Children: N/A  . Years of Education: N/A   Occupational History  . Not on file.   Social History Main Topics  . Smoking status: Never Smoker   . Smokeless tobacco: Never Used  . Alcohol Use: No  . Drug Use: No  . Sexually Active: Yes   Other Topics Concern  . Not on file   Social History Narrative  . No narrative on file    Current Outpatient Prescriptions on File Prior to Visit  Medication Sig Dispense Refill  . aspirin 81 MG tablet Take 81 mg by mouth daily.        Marland Kitchen BYSTOLIC 10 MG tablet TAKE 1 TABLET EVERY DAY  30 tablet  5  . calcium carbonate (TUMS) 500 MG chewable tablet as needed.        . cetirizine (ZYRTEC) 10 MG tablet Take 10 mg by mouth at bedtime as needed.        . chlorpheniramine (CHLOR-TRIMETON) 4 MG tablet Take 4 mg by mouth daily as needed.       . chlorthalidone (HYGROTON) 25 MG tablet Take 1 tablet (  25 mg total) by mouth daily.  30 tablet  3  . cycloSPORINE (RESTASIS) 0.05 % ophthalmic emulsion Place 1 drop into both eyes 2 (two) times daily.      . diphenhydramine-acetaminophen (TYLENOL PM) 25-500 MG TABS Take 1 tablet by mouth at bedtime as needed.        Marland Kitchen lisinopril (PRINIVIL,ZESTRIL) 10 MG tablet Take 1 tablet (10 mg total) by mouth 2 (two) times daily.  180 tablet  3  . Multiple Minerals-Vitamins (CITRACAL PLUS PO) Take 1 tablet by mouth daily.        . Multiple Vitamin (MULTIVITAMIN) capsule Take 1 capsule by mouth daily.        . nisoldipine (SULAR) 34 MG 24 hr tablet TAKE 1 TABLET EVERY DAY  30 tablet  2  . omeprazole (PRILOSEC) 20 MG capsule Take 20 mg by mouth daily.        Marland Kitchen OVER THE COUNTER MEDICATION MegaRed- 1 daily      . trospium (SANCTURA) 20 MG tablet Take 20 mg by mouth daily.        Marland Kitchen dextromethorphan-guaiFENesin (MUCINEX DM) 30-600 MG per 12 hr tablet Take 1 to 2 tabs every 12 hours as needed        No current facility-administered  medications on file prior to visit.    No Known Allergies  Review of Systems  Review of Systems  Constitutional: Negative for fever and malaise/fatigue.  HENT: Negative for congestion.   Eyes: Negative for pain and discharge.  Respiratory: Negative for shortness of breath.   Cardiovascular: Positive for leg swelling. Negative for chest pain and palpitations.  Gastrointestinal: Negative for nausea, abdominal pain and diarrhea.  Genitourinary: Negative for dysuria.  Musculoskeletal: Positive for myalgias. Negative for falls.  Skin: Negative for rash.  Neurological: Negative for loss of consciousness and headaches.  Endo/Heme/Allergies: Negative for polydipsia.  Psychiatric/Behavioral: Negative for depression and suicidal ideas. The patient is not nervous/anxious and does not have insomnia.     Objective  BP 132/62  Pulse 55  Temp(Src) 98.2 F (36.8 C) (Oral)  Ht 5\' 6"  (1.676 m)  Wt 180 lb 1.3 oz (81.684 kg)  BMI 29.08 kg/m2  SpO2 97%  Physical Exam  Physical Exam  Constitutional: She is oriented to person, place, and time and well-developed, well-nourished, and in no distress. No distress.  HENT:  Head: Normocephalic and atraumatic.  Eyes: Conjunctivae are normal.  Neck: Neck supple. No thyromegaly present.  Cardiovascular: Normal rate, regular rhythm and normal heart sounds.   No murmur heard. Pulmonary/Chest: Effort normal and breath sounds normal. She has no wheezes.  Abdominal: She exhibits no distension and no mass.  Musculoskeletal: She exhibits no edema.  Lymphadenopathy:    She has no cervical adenopathy.  Neurological: She is alert and oriented to person, place, and time.  Skin: Skin is warm and dry. No rash noted. She is not diaphoretic.  Psychiatric: Memory, affect and judgment normal.    Lab Results  Component Value Date   TSH 1.02 03/13/2012   Lab Results  Component Value Date   WBC 4.3* 03/13/2012   HGB 13.6 03/13/2012   HCT 39.9 03/13/2012   MCV 91.2  03/13/2012   PLT 279.0 03/13/2012   Lab Results  Component Value Date   CREATININE 1.01 06/24/2012   BUN 13 06/24/2012   NA 139 06/24/2012   K 4.1 06/24/2012   CL 101 06/24/2012   CO2 31 06/24/2012   Lab Results  Component Value Date  ALT 20 03/13/2012   AST 21 03/13/2012   ALKPHOS 65 03/13/2012   BILITOT 0.8 03/13/2012   Lab Results  Component Value Date   CHOL 246* 03/13/2012   Lab Results  Component Value Date   HDL 33.10* 03/13/2012   Lab Results  Component Value Date   LDLCALC 135* 01/13/2009   Lab Results  Component Value Date   TRIG 136.0 03/13/2012   Lab Results  Component Value Date   CHOLHDL 7 03/13/2012     Assessment & Plan  Hypertension Numbers much better with increased Lisinopril check a renal today  Contusion of leg, left With improving but persistent swelling in pain and some left anterior hip pain. Check an ultrasound of soft tissue and xray of hip. Continue heat and aspercreme and report worsening symptoms.. Results of imaging just confirm soft tissue injury  IBS (irritable bowel syndrome) Improved with probiotics.

## 2012-08-08 NOTE — Assessment & Plan Note (Addendum)
With improving but persistent swelling in pain and some left anterior hip pain. Check an ultrasound of soft tissue and xray of hip. Continue heat and aspercreme and report worsening symptoms.. Results of imaging just confirm soft tissue injury

## 2012-08-09 NOTE — Progress Notes (Signed)
Quick Note:  Patient Informed and voiced understanding ______ 

## 2012-08-11 NOTE — Assessment & Plan Note (Signed)
Improved with probiotics. 

## 2012-08-27 ENCOUNTER — Telehealth: Payer: Self-pay | Admitting: Family Medicine

## 2012-08-27 MED ORDER — CHLORPHENIRAMINE MALEATE 4 MG PO TABS: 4.0000 mg | ORAL_TABLET | Freq: Every day | ORAL | Status: DC | PRN

## 2012-08-27 NOTE — Telephone Encounter (Signed)
Refill- chlorthalidone 25mg  tablet. Take one tablet by mouth every day. Qty 30 last fill 5.22.14

## 2012-09-16 ENCOUNTER — Other Ambulatory Visit: Payer: Self-pay | Admitting: *Deleted

## 2012-09-16 MED ORDER — CHLORTHALIDONE 25 MG PO TABS: 25.0000 mg | ORAL_TABLET | Freq: Every day | ORAL | Status: DC

## 2012-09-16 NOTE — Telephone Encounter (Signed)
Rx request to pharmacy/[facility]  

## 2012-11-04 LAB — HM PAP SMEAR

## 2012-11-04 LAB — HM MAMMOGRAPHY

## 2012-11-24 ENCOUNTER — Other Ambulatory Visit: Payer: Self-pay | Admitting: Family Medicine

## 2012-12-13 ENCOUNTER — Encounter: Payer: MEDICARE | Admitting: Family Medicine

## 2013-01-10 HISTORY — PX: MOUTH SURGERY: SHX715

## 2013-01-27 ENCOUNTER — Other Ambulatory Visit: Payer: Self-pay | Admitting: Family Medicine

## 2013-02-06 ENCOUNTER — Encounter: Payer: Self-pay | Admitting: Family Medicine

## 2013-02-06 ENCOUNTER — Ambulatory Visit (INDEPENDENT_AMBULATORY_CARE_PROVIDER_SITE_OTHER): Payer: MEDICARE | Admitting: Family Medicine

## 2013-02-06 VITALS — BP 112/66 | HR 48 | Temp 97.9°F | Ht 66.0 in | Wt 175.1 lb

## 2013-02-06 DIAGNOSIS — R599 Enlarged lymph nodes, unspecified: Secondary | ICD-10-CM

## 2013-02-06 DIAGNOSIS — E785 Hyperlipidemia, unspecified: Secondary | ICD-10-CM

## 2013-02-06 DIAGNOSIS — I10 Essential (primary) hypertension: Secondary | ICD-10-CM

## 2013-02-06 DIAGNOSIS — M899 Disorder of bone, unspecified: Secondary | ICD-10-CM

## 2013-02-06 DIAGNOSIS — E669 Obesity, unspecified: Secondary | ICD-10-CM

## 2013-02-06 DIAGNOSIS — Z0000 Encounter for general adult medical examination without abnormal findings: Secondary | ICD-10-CM

## 2013-02-06 DIAGNOSIS — M25511 Pain in right shoulder: Secondary | ICD-10-CM

## 2013-02-06 DIAGNOSIS — R590 Localized enlarged lymph nodes: Secondary | ICD-10-CM

## 2013-02-06 DIAGNOSIS — M25519 Pain in unspecified shoulder: Secondary | ICD-10-CM

## 2013-02-06 DIAGNOSIS — K219 Gastro-esophageal reflux disease without esophagitis: Secondary | ICD-10-CM

## 2013-02-06 DIAGNOSIS — M8580 Other specified disorders of bone density and structure, unspecified site: Secondary | ICD-10-CM

## 2013-02-06 LAB — CBC
Hemoglobin: 14.1 g/dL (ref 12.0–15.0)
MCHC: 35.9 g/dL (ref 30.0–36.0)

## 2013-02-06 LAB — LIPID PANEL
Cholesterol: 245 mg/dL — ABNORMAL HIGH (ref 0–200)
HDL: 44 mg/dL (ref >39)
Total CHOL/HDL Ratio: 5.6 Ratio
Triglycerides: 117 mg/dL (ref <150)
VLDL: 23 mg/dL (ref 0–40)

## 2013-02-06 LAB — RENAL FUNCTION PANEL
Calcium: 9.9 mg/dL (ref 8.4–10.5)
Glucose, Bld: 90 mg/dL (ref 70–99)
Phosphorus: 3.7 mg/dL (ref 2.3–4.6)
Potassium: 3.8 mEq/L (ref 3.5–5.3)
Sodium: 134 mEq/L — ABNORMAL LOW (ref 135–145)

## 2013-02-06 LAB — HEPATIC FUNCTION PANEL
Bilirubin, Direct: 0.2 mg/dL (ref 0.0–0.3)
Indirect Bilirubin: 0.9 mg/dL (ref 0.0–0.9)

## 2013-02-06 MED ORDER — NEBIVOLOL HCL 5 MG PO TABS: 5.0000 mg | ORAL_TABLET | Freq: Every day | ORAL | Status: DC

## 2013-02-06 NOTE — Progress Notes (Signed)
Patient ID: Erika [name], female   DOB: Jun 26, 1945, 67 y.o.   MRN: [id number] MELISSIA LAHMAN [id number] May 27, 1945 02/06/2013      Progress Note-Follow Up  Subjective  Chief Complaint  Chief Complaint  Patient presents with  . Annual Exam    physical    HPI  Patient is a 67 year old Caucasian female who is in today for annual exam. Overall she feels well. But she does acknowledge fatigue. She describes her schedule is very hectic however she cares frequently for numerous grandchildren as well as her husband. Gets inadequate sleep and exercise. She does try to maintain a heart healthy diet. Her only acute complaint is joint discomfort. She is shoulder and knee pain and stiffness at times although it is manageable. She does have chronic congestion and trouble with allergies intermittently. No fevers or chills. No cough, sore throat, chest pain, palpitations, shortness of breath, GI or GU concerns.  Past Medical History  Diagnosis Date  . Diverticulosis of colon     colonoscopy 04/14/1999  . Obesity   . Chicken pox as a child  . Measles as a child  . Mumps as a child  . GERD (gastroesophageal reflux disease)   . Hyperlipidemia   . Hypertension   . Osteopenia   . History of fibrocystic disease of breast   . Bronchitis 04/02/2011  . Arthritis 04/02/2011  . IBS (irritable bowel syndrome) 04/02/2011  . Overactive bladder 04/02/2011  . Left shoulder strain 06/14/2011  . Dry mouth 03/13/2012  . Preventative health care 03/13/2012  . Allergic state 06/24/2012  . Hypokalemia 06/24/2012  . Contusion of leg, left 08/08/2012    Past Surgical History  Procedure Laterality Date  . Breast reduction surgery  1986  . Bunion repair  1980    on both feet  . Mouth surgery  01-10-13    negative    Family History  Problem Relation Age of Onset  . Stroke Mother 90  . Hyperlipidemia Mother   . Hypertension Mother   . Heart attack Father 20  . Hypertension Father   . Heart disease Father   .  Hypertension Brother   . Aneurysm Brother     aortic  . Hyperlipidemia Brother   . Heart disease Brother     aortic aneurysm rupture  . Multiple sclerosis Daughter   . Alcohol abuse Son   . Schizophrenia Son   . Bipolar disorder Son   . Mental illness Son     schizophrenic, bipolar    History   Social History  . Marital Status: Married    Spouse Name: N/A    Number of Children: N/A  . Years of Education: N/A   Occupational History  . Not on file.   Social History Main Topics  . Smoking status: Never Smoker   . Smokeless tobacco: Never Used  . Alcohol Use: No  . Drug Use: No  . Sexual Activity: Yes   Other Topics Concern  . Not on file   Social History Narrative  . No narrative on file    Current Outpatient Prescriptions on File Prior to Visit  Medication Sig Dispense Refill  . aspirin 81 MG tablet Take 81 mg by mouth daily.        . calcium carbonate (TUMS) 500 MG chewable tablet as needed.        . cetirizine (ZYRTEC) 10 MG tablet Take 10 mg by mouth at bedtime as needed.        Marland Kitchen  chlorthalidone (HYGROTON) 25 MG tablet TAKE 1 TABLET BY MOUTH EVERY DAY  30 tablet  3  . cycloSPORINE (RESTASIS) 0.05 % ophthalmic emulsion Place 1 drop into both eyes 2 (two) times daily.      Marland Kitchen dextromethorphan-guaiFENesin (MUCINEX DM) 30-600 MG per 12 hr tablet Take 1 to 2 tabs every 12 hours as needed       . diphenhydramine-acetaminophen (TYLENOL PM) 25-500 MG TABS Take 1 tablet by mouth at bedtime as needed.        Marland Kitchen lisinopril (PRINIVIL,ZESTRIL) 10 MG tablet Take 1 tablet (10 mg total) by mouth 2 (two) times daily.  180 tablet  3  . Multiple Minerals-Vitamins (CITRACAL PLUS PO) Take 1 tablet by mouth daily.        . Multiple Vitamin (MULTIVITAMIN) capsule Take 1 capsule by mouth daily.        . nisoldipine (SULAR) 34 MG 24 hr tablet TAKE 1 TABLET EVERY DAY  30 tablet  2  . omeprazole (PRILOSEC) 20 MG capsule Take 40 mg by mouth daily.       Marland Kitchen OVER THE COUNTER MEDICATION MegaRed-  1 daily      . trospium (SANCTURA) 20 MG tablet Take 20 mg by mouth daily.         No current facility-administered medications on file prior to visit.    No Known Allergies  Review of Systems  Review of Systems  Constitutional: Negative for fever, chills and malaise/fatigue.  HENT: Negative for congestion, hearing loss and nosebleeds.   Eyes: Negative for discharge.  Respiratory: Negative for cough, sputum production, shortness of breath and wheezing.   Cardiovascular: Negative for chest pain, palpitations and leg swelling.  Gastrointestinal: Negative for heartburn, nausea, vomiting, abdominal pain, diarrhea, constipation and blood in stool.  Genitourinary: Negative for dysuria, urgency, frequency and hematuria.  Musculoskeletal: Negative for back pain, falls and myalgias.  Skin: Negative for rash.  Neurological: Negative for dizziness, tremors, sensory change, focal weakness, loss of consciousness, weakness and headaches.  Endo/Heme/Allergies: Negative for polydipsia. Does not bruise/bleed easily.  Psychiatric/Behavioral: Negative for depression and suicidal ideas. The patient is not nervous/anxious and does not have insomnia.     Objective  BP 112/66  Pulse 48  Temp(Src) 97.9 F (36.6 C) (Oral)  Ht 5\' 6"  (1.676 m)  Wt 175 lb 1.3 oz (79.416 kg)  BMI 28.27 kg/m2  SpO2 98%  Physical Exam  Physical Exam  Constitutional: She is oriented to person, place, and time and well-developed, well-nourished, and in no distress. No distress.  HENT:  Head: Normocephalic and atraumatic.  Hearing grossly normal  Eyes: Conjunctivae are normal.  Neck: Neck supple. No thyromegaly present.  Cardiovascular: Normal rate, regular rhythm and normal heart sounds.   No murmur heard. Pulmonary/Chest: Effort normal and breath sounds normal. She has no wheezes.  Abdominal: Soft. Bowel sounds are normal. She exhibits no distension and no mass. There is no tenderness. There is no rebound and no  guarding.  Musculoskeletal: She exhibits no edema.  Lymphadenopathy:    She has no cervical adenopathy.  Neurological: She is alert and oriented to person, place, and time.  Skin: Skin is warm and dry. No rash noted. She is not diaphoretic.  Psychiatric: Memory, affect and judgment normal.    Lab Results  Component Value Date   TSH 1.02 03/13/2012   Lab Results  Component Value Date   WBC 4.3* 03/13/2012   HGB 13.6 03/13/2012   HCT 39.9 03/13/2012   MCV 91.2  03/13/2012   PLT 279.0 03/13/2012   Lab Results  Component Value Date   CREATININE 0.90 08/08/2012   BUN 10 08/08/2012   NA 136 08/08/2012   K 4.2 08/08/2012   CL 99 08/08/2012   CO2 30 08/08/2012   Lab Results  Component Value Date   ALT 20 03/13/2012   AST 21 03/13/2012   ALKPHOS 65 03/13/2012   BILITOT 0.8 03/13/2012   Lab Results  Component Value Date   CHOL 246* 03/13/2012   Lab Results  Component Value Date   HDL 33.10* 03/13/2012   Lab Results  Component Value Date   LDLCALC 135* 01/13/2009   Lab Results  Component Value Date   TRIG 136.0 03/13/2012   Lab Results  Component Value Date   CHOLHDL 7 03/13/2012     Assessment & Plan  Anterior cervical lymphadenopathy R>L, nontender. Likely reactive since b/l will reassess next month  Hypertension Has noting some low numbers at home, will drop Bystolic dose to 5 mg and recheck next month.   Shoulder pain, bilateral Mild s/p old injuries. May try topical treatments prn  HYPERLIPIDEMIA Mild avoid trans fats and minimize simple carbs and saturated fats. Increase exercise, consider krill oil caps  Obesity Encouraged DASH diet and regular exercise.  Preventative health care Doing well, encouraged heart healthy diet and regular exercise, reviewed labs with patient. Declines flu and pneumonia shot encouraged to reconsider. No trouble with ADLs or depression. Agrees to supply [place] with copies of her papers. She reports her duaghter Conie and son Ethelene Browns would be the children to speak  for her if she could not speak for her self

## 2013-02-06 NOTE — Patient Instructions (Signed)
Lymphadenopathy °Lymphadenopathy means "disease of the lymph glands." But the term is usually used to describe swollen or enlarged lymph glands, also called lymph nodes. These are the bean-shaped organs found in many locations including the neck, underarm, and groin. Lymph glands are part of the immune system, which fights infections in your body. Lymphadenopathy can occur in just one area of the body, such as the neck, or it can be generalized, with lymph node enlargement in several areas. The nodes found in the neck are the most common sites of lymphadenopathy. °CAUSES  °When your immune system responds to germs (such as viruses or bacteria ), infection-fighting cells and fluid build up. This causes the glands to grow in size. This is usually not something to worry about. Sometimes, the glands themselves can become infected and inflamed. This is called lymphadenitis. °Enlarged lymph nodes can be caused by many diseases: °· Bacterial disease, such as strep throat or a skin infection. °· Viral disease, such as a common cold. °· Other germs, such as lyme disease, tuberculosis, or sexually transmitted diseases. °· Cancers, such as lymphoma (cancer of the lymphatic system) or leukemia (cancer of the white blood cells). °· Inflammatory diseases such as lupus or rheumatoid arthritis. °· Reactions to medications. °Many of the diseases above are rare, but important. This is why you should see your caregiver if you have lymphadenopathy. °SYMPTOMS  °· Swollen, enlarged lumps in the neck, back of the head or other locations. °· Tenderness. °· Warmth or redness of the skin over the lymph nodes. °· Fever. °DIAGNOSIS  °Enlarged lymph nodes are often near the source of infection. They can help healthcare providers diagnose your illness. For instance:  °· Swollen lymph nodes around the jaw might be caused by an infection in the mouth. °· Enlarged glands in the neck often signal a throat infection. °· Lymph nodes that are swollen  in more than one area often indicate an illness caused by a virus. °Your caregiver most likely will know what is causing your lymphadenopathy after listening to your history and examining you. Blood tests, x-rays or other tests may be needed. If the cause of the enlarged lymph node cannot be found, and it does not go away by itself, then a biopsy may be needed. Your caregiver will discuss this with you. °TREATMENT  °Treatment for your enlarged lymph nodes will depend on the cause. Many times the nodes will shrink to normal size by themselves, with no treatment. Antibiotics or other medicines may be needed for infection. Only take over-the-counter or prescription medicines for pain, discomfort or fever as directed by your caregiver. °HOME CARE INSTRUCTIONS  °Swollen lymph glands usually return to normal when the underlying medical condition goes away. If they persist, contact your health-care provider. He/she might prescribe antibiotics or other treatments, depending on the diagnosis. Take any medications exactly as prescribed. Keep any follow-up appointments made to check on the condition of your enlarged nodes.  °SEEK MEDICAL CARE IF:  °· Swelling lasts for more than two weeks. °· You have symptoms such as weight loss, night sweats, fatigue or fever that does not go away. °· The lymph nodes are hard, seem fixed to the skin or are growing rapidly. °· Skin over the lymph nodes is red and inflamed. This could mean there is an infection. °SEEK IMMEDIATE MEDICAL CARE IF:  °· Fluid starts leaking from the area of the enlarged lymph node. °· You develop a fever of 102° F (38.9° C) or greater. °· Severe   pain develops (not necessarily at the site of a large lymph node). °· You develop chest pain or shortness of breath. °· You develop worsening abdominal pain. °MAKE SURE YOU:  °· Understand these instructions. °· Will watch your condition. °· Will get help right away if you are not doing well or get worse. °Document  Released: 11/30/2007 Document Revised: 05/15/2011 Document Reviewed: 11/30/2007 °[employer]® Patient Information ©2014 [employer]. ° °

## 2013-02-06 NOTE — Progress Notes (Signed)
Pre visit review using our clinic review tool, if applicable. No additional management support is needed unless otherwise documented below in the visit note. 

## 2013-02-07 LAB — VITAMIN D 25 HYDROXY (VIT D DEFICIENCY, FRACTURES): Vit D, 25-Hydroxy: 52 ng/mL (ref 30–89)

## 2013-02-08 ENCOUNTER — Encounter: Payer: Self-pay | Admitting: Family Medicine

## 2013-02-08 DIAGNOSIS — R590 Localized enlarged lymph nodes: Secondary | ICD-10-CM | POA: Insufficient documentation

## 2013-02-08 HISTORY — DX: Localized enlarged lymph nodes: R59.0

## 2013-02-08 NOTE — Assessment & Plan Note (Signed)
Mild s/p old injuries. May try topical treatments prn

## 2013-02-08 NOTE — Assessment & Plan Note (Signed)
Doing well, encouraged heart healthy diet and regular exercise, reviewed labs with patient. Declines flu and pneumonia shot encouraged to reconsider. No trouble with ADLs or depression. Agrees to supply [place] with copies of her papers. She reports her duaghter Conie and son Ethelene Browns would be the children to speak for her if she could not speak for her self

## 2013-02-08 NOTE — Assessment & Plan Note (Signed)
Encouraged DASH diet and regular exercise 

## 2013-02-08 NOTE — Assessment & Plan Note (Signed)
Has noting some low numbers at home, will drop Bystolic dose to 5 mg and recheck next month.

## 2013-02-08 NOTE — Assessment & Plan Note (Signed)
R>L, nontender. Likely reactive since b/l will reassess next month

## 2013-02-08 NOTE — Assessment & Plan Note (Signed)
Mild avoid trans fats and minimize simple carbs and saturated fats. Increase exercise, consider krill oil caps

## 2013-04-03 ENCOUNTER — Encounter: Payer: Self-pay | Admitting: Family Medicine

## 2013-04-03 ENCOUNTER — Ambulatory Visit (INDEPENDENT_AMBULATORY_CARE_PROVIDER_SITE_OTHER): Payer: BC Managed Care – HMO | Admitting: Family Medicine

## 2013-04-03 VITALS — BP 148/92 | HR 57 | Temp 98.1°F | Ht 66.0 in | Wt 178.0 lb

## 2013-04-03 DIAGNOSIS — K219 Gastro-esophageal reflux disease without esophagitis: Secondary | ICD-10-CM

## 2013-04-03 DIAGNOSIS — E785 Hyperlipidemia, unspecified: Secondary | ICD-10-CM

## 2013-04-03 DIAGNOSIS — R590 Localized enlarged lymph nodes: Secondary | ICD-10-CM

## 2013-04-03 DIAGNOSIS — I10 Essential (primary) hypertension: Secondary | ICD-10-CM

## 2013-04-03 DIAGNOSIS — R599 Enlarged lymph nodes, unspecified: Secondary | ICD-10-CM

## 2013-04-03 NOTE — Progress Notes (Signed)
Subjective:     Patient ID: Erika [name], female   DOB: 20-Jun-1945, 67 y.o.   MRN: [id number]  CC: F/u 8 weeks x BP check  Headache  This is a new problem. The current episode started more than 1 month ago. The problem occurs intermittently. The problem has been unchanged. The pain is located in the frontal, temporal and bilateral region. The pain does not radiate. The quality of the pain is described as sharp and throbbing. The pain is moderate. Associated symptoms include sinus pressure and swollen glands. Pertinent negatives include no dizziness, fever, insomnia, nausea, phonophobia, photophobia, tinnitus or visual change. She has tried acetaminophen for the symptoms. The treatment provided significant relief. Her past medical history is significant for hypertension, obesity and sinus disease.    Pt is here for a 8 week follow-up from a change in her BP medicine for HTN. She reports 123456 systolic BP at home in previous months. At her last visit in December 123456, the Bystolic was dropped down to 5mg  only. Reports occasional frontal and temporal headaches, throbbing and sharp pains. She takes tylenol 1-2 hours into a headache and the pain does resolve. She gets these headaches 1x per week for the last 1+month. She has never had this frequent of headaches before. Pt reports she is unsure if it is sinus or BP related, as she has had some nasal congestion with post-nasal drip this winter. She denies frontal sinus pressure today. Pt reports having a sleep study in the past due to snoring that turned out negative for OSA.   Pt denies following the DASH diet at this time.  Review of Systems  Constitutional: Negative for fever.  HENT: Positive for congestion and sinus pressure. Negative for dental problem and tinnitus.        Pt reports occasional post nasal drip in the last 6 weeks relieved by Mucinex.   Eyes: Negative for photophobia.  Respiratory: Negative for apnea and shortness of breath.    Cardiovascular: Negative for chest pain and palpitations.  Gastrointestinal: Negative for nausea, diarrhea and constipation.  Allergic/Immunologic: Positive for environmental allergies.  Neurological: Positive for headaches. Negative for dizziness and light-headedness.  Psychiatric/Behavioral: The patient does not have insomnia.    Past Medical History  Diagnosis Date  . Diverticulosis of colon     colonoscopy 04/14/1999  . Obesity   . Chicken pox as a child  . Measles as a child  . Mumps as a child  . GERD (gastroesophageal reflux disease)   . Hyperlipidemia   . Hypertension   . Osteopenia   . History of fibrocystic disease of breast   . Bronchitis 04/02/2011  . Arthritis 04/02/2011  . IBS (irritable bowel syndrome) 04/02/2011  . Overactive bladder 04/02/2011  . Left shoulder strain 06/14/2011  . Dry mouth 03/13/2012  . Preventative health care 03/13/2012  . Allergic state 06/24/2012  . Hypokalemia 06/24/2012  . Contusion of leg, left 08/08/2012  . Anterior cervical lymphadenopathy 02/08/2013  . Shoulder pain, bilateral 06/14/2011   Family History  Problem Relation Age of Onset  . Stroke Mother 78  . Hyperlipidemia Mother   . Hypertension Mother   . Heart attack Father 64  . Hypertension Father   . Heart disease Father   . Hypertension Brother   . Aneurysm Brother     aortic  . Hyperlipidemia Brother   . Heart disease Brother     aortic aneurysm rupture  . Multiple sclerosis Daughter   . Alcohol abuse Son   .  Schizophrenia Son   . Bipolar disorder Son   . Mental illness Son     schizophrenic, bipolar   History   Social History  . Marital Status: Married    Spouse Name: N/A    Number of Children: N/A  . Years of Education: N/A   Occupational History  . Not on file.   Social History Main Topics  . Smoking status: Never Smoker   . Smokeless tobacco: Never Used  . Alcohol Use: No  . Drug Use: No  . Sexual Activity: Yes   Other Topics Concern  . Not on file    Social History Narrative  . No narrative on file    Current Outpatient Prescriptions on File Prior to Visit  Medication Sig Dispense Refill  . aspirin 81 MG tablet Take 81 mg by mouth daily.        . calcium carbonate (TUMS) 500 MG chewable tablet as needed.        . cetirizine (ZYRTEC) 10 MG tablet Take 10 mg by mouth at bedtime as needed.        . chlorthalidone (HYGROTON) 25 MG tablet TAKE 1 TABLET BY MOUTH EVERY DAY  30 tablet  3  . cycloSPORINE (RESTASIS) 0.05 % ophthalmic emulsion Place 1 drop into both eyes 2 (two) times daily.      Marland Kitchen dextromethorphan-guaiFENesin (MUCINEX DM) 30-600 MG per 12 hr tablet Take 1 to 2 tabs every 12 hours as needed       . diphenhydramine-acetaminophen (TYLENOL PM) 25-500 MG TABS Take 1 tablet by mouth at bedtime as needed.        Marland Kitchen lisinopril (PRINIVIL,ZESTRIL) 10 MG tablet Take 1 tablet (10 mg total) by mouth 2 (two) times daily.  180 tablet  3  . Multiple Minerals-Vitamins (CITRACAL PLUS PO) Take 1 tablet by mouth daily.        . Multiple Vitamin (MULTIVITAMIN) capsule Take 1 capsule by mouth daily.        . nebivolol (BYSTOLIC) 5 MG tablet Take 1 tablet (5 mg total) by mouth daily.  30 tablet  5  . nisoldipine (SULAR) 34 MG 24 hr tablet TAKE 1 TABLET EVERY DAY  30 tablet  2  . omeprazole (PRILOSEC) 20 MG capsule Take 40 mg by mouth daily.       Marland Kitchen OVER THE COUNTER MEDICATION MegaRed- 1 daily      . trospium (SANCTURA) 20 MG tablet Take 20 mg by mouth daily.         No current facility-administered medications on file prior to visit.   No Known Allergies      Objective:   Physical Exam  Nursing note and vitals reviewed. Constitutional: She is oriented to person, place, and time. She appears well-developed and well-nourished. No distress.  HENT:  Head: Normocephalic and atraumatic.  Nose: Right sinus exhibits no maxillary sinus tenderness and no frontal sinus tenderness. Left sinus exhibits no maxillary sinus tenderness and no frontal sinus  tenderness.  Mouth/Throat: Uvula is midline. No oropharyngeal exudate, posterior oropharyngeal edema or posterior oropharyngeal erythema.  Eyes: Lids are normal. Right eye exhibits no discharge. Left eye exhibits no discharge.  Cardiovascular: Normal rate, regular rhythm and normal heart sounds.   No edema present in ankles, bilaterally.   Pulmonary/Chest: Effort normal and breath sounds normal.  Lymphadenopathy:       Head (right side): No submental, no submandibular, no tonsillar, no preauricular and no posterior auricular adenopathy present.  Head (left side): No submental, no submandibular, no tonsillar, no preauricular and no posterior auricular adenopathy present.    She has cervical adenopathy.       Right cervical: Superficial cervical adenopathy present.       Left cervical: Superficial cervical adenopathy present.  Neurological: She is alert and oriented to person, place, and time.  Skin: Skin is warm and dry. No rash noted.  Psychiatric: She has a normal mood and affect.   Filed Vitals:   04/03/13 0902  BP: 148/92  Pulse: 57  Temp: 98.1 F (36.7 C)   BP Readings from Last 3 Encounters:  04/03/13 148/92  02/06/13 112/66  08/08/12 132/62   Wt Readings from Last 3 Encounters:  04/03/13 178 lb 0.6 oz (80.758 kg)  02/06/13 175 lb 1.3 oz (79.416 kg)  08/08/12 180 lb 1.3 oz (81.684 kg)    Recent Results (from the past 2160 hour(s))  CBC     Status: None   Collection Time    02/06/13  9:38 AM      Result Value Range   WBC 4.9  4.0 - 10.5 K/uL   RBC 4.50  3.87 - 5.11 MIL/uL   Hemoglobin 14.1  12.0 - 15.0 g/dL   HCT 39.3  36.0 - 46.0 %   MCV 87.3  78.0 - 100.0 fL   MCH 31.3  26.0 - 34.0 pg   MCHC 35.9  30.0 - 36.0 g/dL   RDW 12.6  11.5 - 15.5 %   Platelets 309  150 - 400 K/uL  RENAL FUNCTION PANEL     Status: Abnormal   Collection Time    02/06/13  9:38 AM      Result Value Range   Sodium 134 (*) 135 - 145 mEq/L   Potassium 3.8  3.5 - 5.3 mEq/L   Chloride  94 (*) 96 - 112 mEq/L   CO2 31  19 - 32 mEq/L   Glucose, Bld 90  70 - 99 mg/dL   BUN 13  6 - 23 mg/dL   Creat 0.88  0.50 - 1.10 mg/dL   Albumin 4.3  3.5 - 5.2 g/dL   Calcium 9.9  8.4 - 10.5 mg/dL   Phosphorus 3.7  2.3 - 4.6 mg/dL  HEPATIC FUNCTION PANEL     Status: None   Collection Time    02/06/13  9:38 AM      Result Value Range   Total Bilirubin 1.1  0.3 - 1.2 mg/dL   Bilirubin, Direct 0.2  0.0 - 0.3 mg/dL   Indirect Bilirubin 0.9  0.0 - 0.9 mg/dL   Alkaline Phosphatase 71  39 - 117 U/L   AST 25  0 - 37 U/L   ALT 21  0 - 35 U/L   Total Protein 7.3  6.0 - 8.3 g/dL   Albumin 4.3  3.5 - 5.2 g/dL  TSH     Status: None   Collection Time    02/06/13  9:38 AM      Result Value Range   TSH 1.150  0.350 - 4.500 uIU/mL  LIPID PANEL     Status: Abnormal   Collection Time    02/06/13  9:38 AM      Result Value Range   Cholesterol 245 (*) 0 - 200 mg/dL   Comment: ATP III Classification:           < 200        mg/dL        Desirable  200 - 239     mg/dL        Borderline High          >= 240        mg/dL        High         Triglycerides 117  <150 mg/dL   HDL 44  >39 mg/dL   Total CHOL/HDL Ratio 5.6     VLDL 23  0 - 40 mg/dL   LDL Cholesterol 178 (*) 0 - 99 mg/dL   Comment:       Total Cholesterol/HDL Ratio:CHD Risk                            Coronary Heart Disease Risk Table                                            Men       Women              1/2 Average Risk              3.4        3.3                  Average Risk              5.0        4.4               2X Average Risk              9.6        7.1               3X Average Risk             23.4       11.0     Use the calculated Patient Ratio above and the CHD Risk table      to determine the patient's CHD Risk.     ATP III Classification (LDL):           < 100        mg/dL         Optimal          100 - 129     mg/dL         Near or Above Optimal          130 - 159     mg/dL         Borderline High           160 - 189     mg/dL         High           > 190        mg/dL         Very High        VITAMIN D 25 HYDROXY     Status: None   Collection Time    02/06/13  9:38 AM      Result Value Range   Vit D, 25-Hydroxy 52  30 - 89 ng/mL   Comment: This assay accurately quantifies Vitamin D, which is the sum of the     25-Hydroxy forms of Vitamin D2 and D3.  Studies have shown that the  optimum concentration of 25-Hydroxy Vitamin D is 30 ng/mL or higher.      Concentrations of Vitamin D between 20 and 29 ng/mL are considered to     be insufficient and concentrations less than 20 ng/mL are considered     to be deficient for Vitamin D.       Assessment:      1. Tension headaches, controlled by Tylenol prn. 2. HTN 3. Anterior Cervical lymphadenopathy, improved from last visit 02/2013.  4. Hyperlipidemia, without statin use.  5. Obesity     Plan:      1. Pt was advised for look for patterns with her headaches including the time of the day. She was educated about avoiding triggers such as low/high blood sugars and advised to stay adequately hydrated. Pt advised to call if headaches become more frequent or uncontrollable with Tylenol. 2. Pt to continue current BP regimen and asked to check at pharmacy occasionally for an out-of-office reading. Pt to return 6-8 weeks for BP check.  3. Will continue to monitor cervical lymphadenopathy for improvement. 4. Discussed hyperlipidemia and lab results with patient. She will consider red yeast rice and decide whether she wants to try a statin at 6 mo f/u. 5. Pt was educated about DASH diet and recommended to include cardiorespiratory exercise in weekly routine. 6. Labs at 6 mo CPE to include lipid panel (fasting), TSH, CBC, CMP, renal panel.   01.29.2015  [place] Lucelia Lacey, PA-S.  Patient seen, interviewed and examined with student, agree with documentation  Anterior cervical lymphadenopathy Improving right nearly as small as the left and nontender  today  Hypertension Adequate control patient reports no concerning symptoms encouraged increased exercise and DASH diet.   GERD No c/o today, well controlled on meds, avoid offending foods, add a probiotics  HYPERLIPIDEMIA Discussed possibility of statins in future, avoid trans fats, increase exercise, if still elevated at recheck patient may consider statins

## 2013-04-03 NOTE — Patient Instructions (Signed)
  cholest off by Schiff is a red yeast rice for cholesterol coQ10 enzyme daily to prevent achy muscles  Cholesterol Cholesterol is a white, waxy, fat-like protein needed by your body in small amounts. The liver makes all the cholesterol you need. It is carried from the liver by the blood through the blood vessels. Deposits (plaque) may build up on blood vessel walls. This makes the arteries narrower and stiffer. Plaque increases the risk for heart attack and stroke. You cannot feel your cholesterol level even if it is very high. The only way to know is by a blood test to check your lipid (fats) levels. Once you know your cholesterol levels, you should keep a record of the test results. Work with your caregiver to to keep your levels in the desired range. WHAT THE RESULTS MEAN:  Total cholesterol is a rough measure of all the cholesterol in your blood.  LDL is the so-called bad cholesterol. This is the type that deposits cholesterol in the walls of the arteries. You want this level to be low.  HDL is the good cholesterol because it cleans the arteries and carries the LDL away. You want this level to be high.  Triglycerides are fat that the body can either burn for energy or store. High levels are closely linked to heart disease. DESIRED LEVELS:  Total cholesterol below 200.  LDL below 100 for people at risk, below 70 for very high risk.  HDL above 50 is good, above 60 is best.  Triglycerides below 150. HOW TO LOWER YOUR CHOLESTEROL:  Diet.  Choose fish or white meat chicken and [place], roasted or baked. Limit fatty cuts of red meat, fried foods, and processed meats, such as sausage and lunch meat.  Eat lots of fresh fruits and vegetables. Choose whole grains, beans, pasta, potatoes and cereals.  Use only small amounts of olive, corn or canola oils. Avoid butter, mayonnaise, shortening or palm kernel oils. Avoid foods with trans-fats.  Use skim/nonfat milk and low-fat/nonfat yogurt  and cheeses. Avoid whole milk, cream, ice cream, egg yolks and cheeses. Healthy desserts include angel food cake, ginger snaps, animal crackers, hard candy, popsicles, and low-fat/nonfat frozen yogurt. Avoid pastries, cakes, pies and cookies.  Exercise.  A regular program helps decrease LDL and raises HDL.  Helps with weight control.  Do things that increase your activity level like gardening, walking, or taking the stairs.  Medication.  May be prescribed by your caregiver to help lowering cholesterol and the risk for heart disease.  You may need medicine even if your levels are normal if you have several risk factors. HOME CARE INSTRUCTIONS   Follow your diet and exercise programs as suggested by your caregiver.  Take medications as directed.  Have blood work done when your caregiver feels it is necessary. MAKE SURE YOU:   Understand these instructions.  Will watch your condition.  Will get help right away if you are not doing well or get worse. Document Released: 11/15/2000 Document Revised: 05/15/2011 Document Reviewed: 12/04/2012 [facility] Patient Information 2014 [place], [place].

## 2013-04-03 NOTE — Assessment & Plan Note (Signed)
Improving right nearly as small as the left and nontender today

## 2013-04-03 NOTE — Progress Notes (Signed)
Pre visit review using our clinic review tool, if applicable. No additional management support is needed unless otherwise documented below in the visit note. 

## 2013-04-06 ENCOUNTER — Encounter: Payer: Self-pay | Admitting: Family Medicine

## 2013-04-06 NOTE — Assessment & Plan Note (Signed)
No c/o today, well controlled on meds, avoid offending foods, add a probiotics

## 2013-04-06 NOTE — Assessment & Plan Note (Signed)
Adequate control patient reports no concerning symptoms encouraged increased exercise and DASH diet.

## 2013-04-06 NOTE — Assessment & Plan Note (Signed)
Discussed possibility of statins in future, avoid trans fats, increase exercise, if still elevated at recheck patient may consider statins

## 2013-06-16 ENCOUNTER — Telehealth: Payer: Self-pay | Admitting: Family Medicine

## 2013-06-16 DIAGNOSIS — I10 Essential (primary) hypertension: Secondary | ICD-10-CM

## 2013-06-16 MED ORDER — LISINOPRIL 10 MG PO TABS: 10.0000 mg | ORAL_TABLET | Freq: Two times a day (BID) | ORAL | Status: DC

## 2013-06-16 NOTE — Telephone Encounter (Signed)
Refill lisinopril 

## 2013-06-16 NOTE — Telephone Encounter (Signed)
Medication sent to pharmacy  

## 2013-07-03 ENCOUNTER — Other Ambulatory Visit: Payer: Self-pay | Admitting: Family Medicine

## 2013-08-15 ENCOUNTER — Other Ambulatory Visit: Payer: Self-pay | Admitting: Family Medicine

## 2013-10-08 ENCOUNTER — Other Ambulatory Visit: Payer: Self-pay | Admitting: Family Medicine

## 2013-12-03 HISTORY — PX: HAMMER TOE SURGERY: SHX385

## 2013-12-18 ENCOUNTER — Telehealth: Payer: Self-pay

## 2013-12-18 NOTE — Telephone Encounter (Signed)
We received a medication request for Bystolic 10 mg tablet 1 daily  Our records show that patient has been taking Bystolic 5 mg?  Left a message with patients spouse to have pt returning my call.   Need to know which dose patient is taking?

## 2013-12-18 NOTE — Telephone Encounter (Signed)
Patient states that she buys 10mg  but splits the tablet in half. So, patient states that she is taking 5mg . She states that it is more economical for her doing it this way.

## 2013-12-19 MED ORDER — NEBIVOLOL HCL 10 MG PO TABS: 5.0000 mg | ORAL_TABLET | Freq: Every day | ORAL | Status: DC

## 2013-12-19 NOTE — Telephone Encounter (Signed)
RX sent- please inform pt that she needs an appt before this rx runs out for additional refills

## 2013-12-19 NOTE — Telephone Encounter (Signed)
Left message with lady at home # to have patient call me.

## 2013-12-23 ENCOUNTER — Ambulatory Visit (INDEPENDENT_AMBULATORY_CARE_PROVIDER_SITE_OTHER): Payer: Medicare Other | Admitting: Podiatry

## 2013-12-23 ENCOUNTER — Ambulatory Visit (INDEPENDENT_AMBULATORY_CARE_PROVIDER_SITE_OTHER): Payer: Medicare Other

## 2013-12-23 ENCOUNTER — Encounter: Payer: Self-pay | Admitting: Podiatry

## 2013-12-23 VITALS — BP 161/73 | HR 69 | Resp 16 | Ht 66.0 in | Wt 172.0 lb

## 2013-12-23 DIAGNOSIS — M79671 Pain in right foot: Secondary | ICD-10-CM

## 2013-12-23 DIAGNOSIS — Q828 Other specified congenital malformations of skin: Secondary | ICD-10-CM

## 2013-12-23 NOTE — Progress Notes (Signed)
   Subjective:    Patient ID: Erika [name], female    DOB: 1946-02-16, 68 y.o.   MRN: [id number]  HPI Comments: "I have this place on the bottom"  Patient c/o tender callused area plantar forefoot right for several months. She has "picked" it off a few times and it comes back. Very sore when walking.  Foot Pain Associated symptoms include coughing.      Review of Systems  Respiratory: Positive for cough.   Genitourinary: Positive for urgency.  All other systems reviewed and are negative.      Objective:   Physical Exam: I have reviewed her past medical history medications allergies surgeries social history and review of systems. Pulses are strongly palpable. Neurologic sensorium is intact per Semmes-Weinstein monofilament. Deep tendon reflexes are intact bilateral muscle strength is 5 over 5 dorsiflexors plantar flexors inverters everters all intrinsic musculature is intact. Orthopedic evaluation demonstrates rectus foot type bilateral. Mild hammertoe deformity of the right foot more rigid at the PIPJ is resulting in a mild hammertoe deformity and pressure on the second metatarsal head. This is resulting in a porokeratotic lesion sub-second metatarsophalangeal joint of the right foot. There is no erythema edema cellulitis drainage or odor no signs of infection. Simply porokeratotic lesion is painful on palpation.        Assessment & Plan:  Assessment: Hammertoe deformity with mild osteoarthritic changes of the PIPJ second digit right greater than left. Porokeratotic lesion plantar aspect of the second metatarsophalangeal joint right.  Plan: Discussed etiology pathology conservative versus surgical therapies. Debridement reactive hyperkeratotic tissue discussed appropriate shoe gear stretching sizes ice therapy shoe modifications and I will followup with her as needed. I also place salicylic acid under occlusion today for chemical debridement she will leave this on for 3 days and not  get it wet been removed and wash the foot thoroughly. Should any questions or concerns her signs of infection she will notify immediately.

## 2013-12-29 ENCOUNTER — Telehealth: Payer: Self-pay | Admitting: Family Medicine

## 2013-12-29 NOTE — Telephone Encounter (Signed)
°  Caller name: [facility]  Call back number: [phone]   Reason for call:    [facility] called to advise you nebivolol (BYSTOLIC) 10 MG tablet has been approved as of today.

## 2014-01-12 ENCOUNTER — Encounter: Payer: Self-pay | Admitting: Family Medicine

## 2014-01-12 ENCOUNTER — Ambulatory Visit (INDEPENDENT_AMBULATORY_CARE_PROVIDER_SITE_OTHER): Payer: Medicare Other

## 2014-01-12 ENCOUNTER — Ambulatory Visit (INDEPENDENT_AMBULATORY_CARE_PROVIDER_SITE_OTHER): Payer: Medicare Other | Admitting: Family Medicine

## 2014-01-12 VITALS — BP 190/60 | HR 56 | Temp 97.5°F | Ht 66.0 in | Wt 172.4 lb

## 2014-01-12 DIAGNOSIS — K219 Gastro-esophageal reflux disease without esophagitis: Secondary | ICD-10-CM | POA: Diagnosis not present

## 2014-01-12 DIAGNOSIS — E663 Overweight: Secondary | ICD-10-CM

## 2014-01-12 DIAGNOSIS — R1013 Epigastric pain: Secondary | ICD-10-CM | POA: Diagnosis not present

## 2014-01-12 DIAGNOSIS — Z23 Encounter for immunization: Secondary | ICD-10-CM

## 2014-01-12 DIAGNOSIS — R059 Cough, unspecified: Secondary | ICD-10-CM

## 2014-01-12 DIAGNOSIS — R05 Cough: Secondary | ICD-10-CM | POA: Diagnosis not present

## 2014-01-12 DIAGNOSIS — E785 Hyperlipidemia, unspecified: Secondary | ICD-10-CM | POA: Diagnosis not present

## 2014-01-12 DIAGNOSIS — I10 Essential (primary) hypertension: Secondary | ICD-10-CM

## 2014-01-12 LAB — LIPID PANEL
CHOL/HDL RATIO: 6
Cholesterol: 237 mg/dL — ABNORMAL HIGH (ref 0–200)
HDL: 39.3 mg/dL (ref >39.00)
LDL Cholesterol: 171 mg/dL — ABNORMAL HIGH (ref 0–99)
NonHDL: 197.7
TRIGLYCERIDES: 134 mg/dL (ref 0.0–149.0)
VLDL: 26.8 mg/dL (ref 0.0–40.0)

## 2014-01-12 LAB — CBC
HCT: 42 % (ref 36.0–46.0)
Hemoglobin: 14.1 g/dL (ref 12.0–15.0)
MCHC: 33.5 g/dL (ref 30.0–36.0)
MCV: 93.4 fl (ref 78.0–100.0)
PLATELETS: 308 10*3/uL (ref 150.0–400.0)
RBC: 4.5 Mil/uL (ref 3.87–5.11)
RDW: 12.2 % (ref 11.5–15.5)
WBC: 5.4 10*3/uL (ref 4.0–10.5)

## 2014-01-12 LAB — RENAL FUNCTION PANEL
ALBUMIN: 3.6 g/dL (ref 3.5–5.2)
BUN: 12 mg/dL (ref 6–23)
CALCIUM: 9.5 mg/dL (ref 8.4–10.5)
CO2: 28 meq/L (ref 19–32)
CREATININE: 0.9 mg/dL (ref 0.4–1.2)
Chloride: 102 mEq/L (ref 96–112)
GFR: 67.04 mL/min (ref >60.00)
GLUCOSE: 88 mg/dL (ref 70–99)
Phosphorus: 3.6 mg/dL (ref 2.3–4.6)
Potassium: 3.6 mEq/L (ref 3.5–5.1)
Sodium: 138 mEq/L (ref 135–145)

## 2014-01-12 LAB — HEPATIC FUNCTION PANEL
ALT: 28 U/L (ref 0–35)
AST: 23 U/L (ref 0–37)
Albumin: 3.6 g/dL (ref 3.5–5.2)
Alkaline Phosphatase: 66 U/L (ref 39–117)
Bilirubin, Direct: 0 mg/dL (ref 0.0–0.3)
TOTAL PROTEIN: 7.6 g/dL (ref 6.0–8.3)
Total Bilirubin: 0.7 mg/dL (ref 0.2–1.2)

## 2014-01-12 LAB — H. PYLORI ANTIBODY, IGG: H PYLORI IGG: NEGATIVE

## 2014-01-12 LAB — TSH: TSH: 0.77 u[IU]/mL (ref 0.35–4.50)

## 2014-01-12 MED ORDER — ZOSTER VACCINE LIVE 19400 UNT/0.65ML ~~LOC~~ SOLR: 0.6500 mL | Freq: Once | SUBCUTANEOUS | Status: DC

## 2014-01-12 MED ORDER — LOSARTAN POTASSIUM 50 MG PO TABS: 50.0000 mg | ORAL_TABLET | Freq: Every day | ORAL | Status: DC

## 2014-01-12 MED ORDER — RANITIDINE HCL 300 MG PO TABS: 300.0000 mg | ORAL_TABLET | Freq: Every day | ORAL | Status: DC

## 2014-01-12 NOTE — Progress Notes (Signed)
Pre visit review using our clinic review tool, if applicable. No additional management support is needed unless otherwise documented below in the visit note. 

## 2014-01-12 NOTE — Patient Instructions (Addendum)
No DM, No D  Move 1/2 of meds to evening, Ranitidine at night, Omeprazole in morning, Zyrtec to night, keep Chlorthalidone in am  NEXT VISIT NURSE BP CHECK IN 2 weeks MD APPT in 6 weeks Minimize caffeine and sodium, get 7-8 hours sleep   Get a blood pressure cuff and increase Losartan 50 mg twice daily if BP remains above 170/95   Hypertension Hypertension, commonly called high blood pressure, is when the force of blood pumping through your arteries is too strong. Your arteries are the blood vessels that carry blood from your heart throughout your body. A blood pressure reading consists of a higher number over a lower number, such as 110/72. The higher number (systolic) is the pressure inside your arteries when your heart pumps. The lower number (diastolic) is the pressure inside your arteries when your heart relaxes. Ideally you want your blood pressure below 120/80. Hypertension forces your heart to work harder to pump blood. Your arteries may become narrow or stiff. Having hypertension puts you at risk for heart disease, stroke, and other problems.  RISK FACTORS Some risk factors for high blood pressure are controllable. Others are not.  Risk factors you cannot control include:   Race. You may be at higher risk if you are African American.  Age. Risk increases with age.  Gender. Men are at higher risk than women before age 54 years. After age 6, women are at higher risk than men. Risk factors you can control include:  Not getting enough exercise or physical activity.  Being overweight.  Getting too much fat, sugar, calories, or salt in your diet.  Drinking too much alcohol. SIGNS AND SYMPTOMS Hypertension does not usually cause signs or symptoms. Extremely high blood pressure (hypertensive crisis) may cause headache, anxiety, shortness of breath, and nosebleed. DIAGNOSIS  To check if you have hypertension, your health care provider will measure your blood pressure while you  are seated, with your arm held at the level of your heart. It should be measured at least twice using the same arm. Certain conditions can cause a difference in blood pressure between your right and left arms. A blood pressure reading that is higher than normal on one occasion does not mean that you need treatment. If one blood pressure reading is high, ask your health care provider about having it checked again. TREATMENT  Treating high blood pressure includes making lifestyle changes and possibly taking medicine. Living a healthy lifestyle can help lower high blood pressure. You may need to change some of your habits. Lifestyle changes may include:  Following the DASH diet. This diet is high in fruits, vegetables, and whole grains. It is low in salt, red meat, and added sugars.  Getting at least 2 hours of brisk physical activity every week.  Losing weight if necessary.  Not smoking.  Limiting alcoholic beverages.  Learning ways to reduce stress. If lifestyle changes are not enough to get your blood pressure under control, your health care provider may prescribe medicine. You may need to take more than one. Work closely with your health care provider to understand the risks and benefits. HOME CARE INSTRUCTIONS  Have your blood pressure rechecked as directed by your health care provider.   Take medicines only as directed by your health care provider. Follow the directions carefully. Blood pressure medicines must be taken as prescribed. The medicine does not work as well when you skip doses. Skipping doses also puts you at risk for problems.   Do not  smoke.   Monitor your blood pressure at home as directed by your health care provider. SEEK MEDICAL CARE IF:   You think you are having a reaction to medicines taken.  You have recurrent headaches or feel dizzy.  You have swelling in your ankles.  You have trouble with your vision. SEEK IMMEDIATE MEDICAL CARE IF:  You develop a  severe headache or confusion.  You have unusual weakness, numbness, or feel faint.  You have severe chest or abdominal pain.  You vomit repeatedly.  You have trouble breathing. MAKE SURE YOU:   Understand these instructions.  Will watch your condition.  Will get help right away if you are not doing well or get worse. Document Released: 02/20/2005 Document Revised: 07/07/2013 Document Reviewed: 12/13/2012 [facility] Patient Information 2015 [place], [place]. This information is not intended to replace advice given to you by your health care provider. Make sure you discuss any questions you have with your health care provider.

## 2014-01-12 NOTE — Progress Notes (Signed)
Patient ID: MONTANA BRYNGELSON, female   DOB: Mar 02, 1946, 68 y.o.   MRN: [id number] MARYALICE PASLEY [id number] 1945/05/03 01/12/2014      Progress Note-Follow Up  Subjective  Chief Complaint  Chief Complaint  Patient presents with  . Follow-up  . Injections    flu    HPI  Patient is a 68 year old female in today for routine medical care. She is in today for follow up. She has been struggling with head congestion with cough productive of phlegm. Has been using Mucinex DM with minimal resutls. Has not taken her meds today anticipating lab work.  No fevers, chills, myalgias. Has been under a great deal of stress. Denies CP/palp/SOB/HA/congestion/fevers/GI or GU c/o. Taking meds as prescribed  Past Medical History  Diagnosis Date  . Diverticulosis of colon     colonoscopy 04/14/1999  . Obesity   . Chicken pox as a child  . Measles as a child  . Mumps as a child  . GERD (gastroesophageal reflux disease)   . Hyperlipidemia   . Hypertension   . Osteopenia   . History of fibrocystic disease of breast   . Bronchitis 04/02/2011  . Arthritis 04/02/2011  . IBS (irritable bowel syndrome) 04/02/2011  . Overactive bladder 04/02/2011  . Left shoulder strain 06/14/2011  . Dry mouth 03/13/2012  . Preventative health care 03/13/2012  . Allergic state 06/24/2012  . Hypokalemia 06/24/2012  . Contusion of leg, left 08/08/2012  . Anterior cervical lymphadenopathy 02/08/2013  . Shoulder pain, bilateral 06/14/2011    Past Surgical History  Procedure Laterality Date  . Breast reduction surgery  1986  . Bunion repair  1980    on both feet  . Mouth surgery  01-10-13    negative  . Hammer toe surgery  12-03-13    right foot    Family History  Problem Relation Age of Onset  . Stroke Mother 5  . Hyperlipidemia Mother   . Hypertension Mother   . Heart attack Father 16  . Hypertension Father   . Heart disease Father   . Hypertension Brother   . Aneurysm Brother     aortic  . Hyperlipidemia Brother   .  Heart disease Brother     aortic aneurysm rupture  . Multiple sclerosis Daughter   . Alcohol abuse Son   . Schizophrenia Son   . Bipolar disorder Son   . Mental illness Son     schizophrenic, bipolar    History   Social History  . Marital Status: Married    Spouse Name: N/A    Number of Children: N/A  . Years of Education: N/A   Occupational History  . Not on file.   Social History Main Topics  . Smoking status: Never Smoker   . Smokeless tobacco: Never Used  . Alcohol Use: No  . Drug Use: No  . Sexual Activity: Yes   Other Topics Concern  . Not on file   Social History Narrative    Current Outpatient Prescriptions on File Prior to Visit  Medication Sig Dispense Refill  . aspirin 81 MG tablet Take 81 mg by mouth daily.      . calcium carbonate (TUMS) 500 MG chewable tablet as needed.      . cetirizine (ZYRTEC) 10 MG tablet Take 10 mg by mouth at bedtime as needed.      . chlorthalidone (HYGROTON) 25 MG tablet TAKE 1 TABLET BY MOUTH EVERY DAY 30 tablet 3  . cycloSPORINE (RESTASIS)  0.05 % ophthalmic emulsion Place 1 drop into both eyes 2 (two) times daily.    Marland Kitchen dextromethorphan-guaiFENesin (MUCINEX DM) 30-600 MG per 12 hr tablet Take 1 to 2 tabs every 12 hours as needed     . diphenhydramine-acetaminophen (TYLENOL PM) 25-500 MG TABS Take 1 tablet by mouth at bedtime as needed.      Marland Kitchen lisinopril (PRINIVIL,ZESTRIL) 10 MG tablet TAKE 1 TABLET BY MOUTH TWICE A DAY 180 tablet 0  . Multiple Minerals-Vitamins (CITRACAL PLUS PO) Take 1 tablet by mouth daily.      . nebivolol (BYSTOLIC) 10 MG tablet Take 0.5 tablets (5 mg total) by mouth daily. 30 tablet 0  . nisoldipine (SULAR) 34 MG 24 hr tablet TAKE 1 TABLET EVERY DAY 30 tablet 2  . omeprazole (PRILOSEC) 20 MG capsule Take 40 mg by mouth daily.     Marland Kitchen OVER THE COUNTER MEDICATION MegaRed- 1 daily    . trospium (SANCTURA) 20 MG tablet Take 20 mg by mouth daily.       No current facility-administered medications on file prior  to visit.    No Known Allergies  Review of Systems  Review of Systems  Constitutional: Negative for fever and malaise/fatigue.  HENT: Negative for congestion.   Eyes: Negative for discharge.  Respiratory: Positive for cough. Negative for shortness of breath.   Cardiovascular: Negative for chest pain, palpitations and leg swelling.  Gastrointestinal: Positive for heartburn and nausea. Negative for abdominal pain and diarrhea.  Genitourinary: Negative for dysuria.  Musculoskeletal: Negative for falls.  Skin: Negative for rash.  Neurological: Positive for headaches. Negative for loss of consciousness.  Endo/Heme/Allergies: Negative for polydipsia.  Psychiatric/Behavioral: Negative for depression and suicidal ideas. The patient is not nervous/anxious and does not have insomnia.     Objective  BP 199/57 mmHg  Pulse 56  Temp(Src) 97.5 F (36.4 C) (Oral)  Ht 5\' 6"  (1.676 m)  Wt 172 lb 6.4 oz (78.2 kg)  BMI 27.84 kg/m2  SpO2 99%  Physical Exam  Physical Exam  Constitutional: She is oriented to person, place, and time and well-developed, well-nourished, and in no distress. No distress.  HENT:  Head: Normocephalic and atraumatic.  Eyes: Conjunctivae are normal.  Neck: Neck supple. No thyromegaly present.  Cardiovascular: Normal rate, regular rhythm and normal heart sounds.   No murmur heard. Pulmonary/Chest: Effort normal and breath sounds normal. She has no wheezes.  Abdominal: She exhibits no distension and no mass.  Musculoskeletal: She exhibits no edema.  Lymphadenopathy:    She has no cervical adenopathy.  Neurological: She is alert and oriented to person, place, and time.  Skin: Skin is warm and dry. No rash noted. She is not diaphoretic.  Psychiatric: Memory, affect and judgment normal.    Lab Results  Component Value Date   TSH 1.150 02/06/2013   Lab Results  Component Value Date   WBC 4.9 02/06/2013   HGB 14.1 02/06/2013   HCT 39.3 02/06/2013   MCV 87.3  02/06/2013   PLT 309 02/06/2013   Lab Results  Component Value Date   CREATININE 0.88 02/06/2013   BUN 13 02/06/2013   NA 134* 02/06/2013   K 3.8 02/06/2013   CL 94* 02/06/2013   CO2 31 02/06/2013   Lab Results  Component Value Date   ALT 21 02/06/2013   AST 25 02/06/2013   ALKPHOS 71 02/06/2013   BILITOT 1.1 02/06/2013   Lab Results  Component Value Date   CHOL 245* 02/06/2013   Lab  Results  Component Value Date   HDL 44 02/06/2013   Lab Results  Component Value Date   LDLCALC 178* 02/06/2013   Lab Results  Component Value Date   TRIG 117 02/06/2013   Lab Results  Component Value Date   CHOLHDL 5.6 02/06/2013     Assessment & Plan  Hypertension Elevated but asymptomatic. Encouraged to minimize sodium and caffeine. Did not take meds today agrees to go home and take. Seek care if any concerning symptoms occur. Recheck pressure in next couple of weeks on meds.  GERD Avoid offending foods, start probiotics. Do not eat large meals in late evening and consider raising head of bed.   Hyperlipidemia Encouraged heart healthy diet, increase exercise, avoid trans fats, consider a krill oil cap daily  Overweight Encouraged DASH diet, decrease po intake and increase exercise as tolerated. Needs 7-8 hours of sleep nightly. Avoid trans fats, eat small, frequent meals every 4-5 hours with lean proteins, complex carbs and healthy fats. Minimize simple carbs

## 2014-01-13 ENCOUNTER — Telehealth: Payer: Self-pay

## 2014-01-13 NOTE — Telephone Encounter (Signed)
Results letter mailed to patient.

## 2014-01-14 ENCOUNTER — Encounter: Payer: Self-pay | Admitting: Family Medicine

## 2014-01-14 NOTE — Assessment & Plan Note (Signed)
Encouraged DASH diet, decrease po intake and increase exercise as tolerated. Needs 7-8 hours of sleep nightly. Avoid trans fats, eat small, frequent meals every 4-5 hours with lean proteins, complex carbs and healthy fats. Minimize simple carbs 

## 2014-01-14 NOTE — Assessment & Plan Note (Signed)
Avoid offending foods, start probiotics. Do not eat large meals in late evening and consider raising head of bed.  

## 2014-01-14 NOTE — Assessment & Plan Note (Signed)
Encouraged heart healthy diet, increase exercise, avoid trans fats, consider a krill oil cap daily 

## 2014-01-14 NOTE — Assessment & Plan Note (Signed)
Elevated but asymptomatic. Encouraged to minimize sodium and caffeine. Did not take meds today agrees to go home and take. Seek care if any concerning symptoms occur. Recheck pressure in next couple of weeks on meds.

## 2014-01-15 LAB — IGG FOOD PANEL
ALLERGEN BEEF IGG: 45.9 ug/mL — AB (ref <2.0)
ALLERGEN EGG YOLK IGG: 12.9 ug/mL — AB (ref <2.0)
ALLERGEN PEANUT IGG: 1.47 ug/mL — AB (ref <0.15)
Corn, IgG: 0.21 ug/mL — ABNORMAL HIGH (ref <0.15)
Egg white, IgG: 24.9 ug/mL — ABNORMAL HIGH (ref <2.0)
Wheat, IgG: 9.84 ug/mL — ABNORMAL HIGH (ref <0.15)

## 2014-01-19 ENCOUNTER — Ambulatory Visit (INDEPENDENT_AMBULATORY_CARE_PROVIDER_SITE_OTHER): Payer: Medicare Other | Admitting: Gastroenterology

## 2014-01-19 ENCOUNTER — Encounter: Payer: Self-pay | Admitting: Gastroenterology

## 2014-01-19 VITALS — BP 138/70 | HR 60 | Ht 64.25 in | Wt 171.4 lb

## 2014-01-19 DIAGNOSIS — R110 Nausea: Secondary | ICD-10-CM | POA: Insufficient documentation

## 2014-01-19 DIAGNOSIS — R0789 Other chest pain: Secondary | ICD-10-CM | POA: Insufficient documentation

## 2014-01-19 DIAGNOSIS — K219 Gastro-esophageal reflux disease without esophagitis: Secondary | ICD-10-CM

## 2014-01-19 DIAGNOSIS — R059 Cough, unspecified: Secondary | ICD-10-CM | POA: Insufficient documentation

## 2014-01-19 DIAGNOSIS — R079 Chest pain, unspecified: Secondary | ICD-10-CM | POA: Insufficient documentation

## 2014-01-19 DIAGNOSIS — R05 Cough: Secondary | ICD-10-CM

## 2014-01-19 HISTORY — DX: Chest pain, unspecified: R07.9

## 2014-01-19 NOTE — Progress Notes (Signed)
01/19/2014 Erika [name] [id number] 11/09/1945   HISTORY OF PRESENT ILLNESS:  This is a 68 year old female who is known to Dr. Deatra Ina for colonoscopy and March 2011. At that time she was found to have only moderate diverticulosis in the sigmoid colon and repeat procedure is recommended in 10 years from that time. She presents to our office today with complaints of a cough, reflux, nausea, and intermittent chest pains that seem related to the other symptoms.  She says that she has trouble lying down at night due to the symptoms. She had been on omeprazole 20 mg daily that was recently increased to twice a day and ranitidine 300 mg was added at bedtime; both of these changes were made about one week ago. She has also been using some Tums as needed.  She has never had an endoscopy in the past.  Recent CBC, CMP, and TSH were within normal limits. H pylori IgG antibody was recently negative.   Past Medical History  Diagnosis Date  . Diverticulosis of colon     colonoscopy 04/14/1999  . Obesity   . Chicken pox as a child  . Measles as a child  . Mumps as a child  . GERD (gastroesophageal reflux disease)   . Hyperlipidemia   . Hypertension   . Osteopenia   . History of fibrocystic disease of breast   . Bronchitis 04/02/2011  . Arthritis 04/02/2011  . IBS (irritable bowel syndrome) 04/02/2011  . Overactive bladder 04/02/2011  . Left shoulder strain 06/14/2011  . Dry mouth 03/13/2012  . Preventative health care 03/13/2012  . Allergic state 06/24/2012  . Hypokalemia 06/24/2012  . Contusion of leg, left 08/08/2012  . Anterior cervical lymphadenopathy 02/08/2013  . Shoulder pain, bilateral 06/14/2011  . Overweight    Past Surgical History  Procedure Laterality Date  . Breast reduction surgery  1986  . Bunionectomy Bilateral 1980  . Mouth surgery  01-10-13    negative  . Hammer toe surgery Right 12-03-13    foot  . Tonsillectomy      reports that she has never smoked. She has never used  smokeless tobacco. She reports that she does not drink alcohol or use illicit drugs. family history includes Alcohol abuse in her son; Aneurysm in her brother; Bipolar disorder in her son; Heart attack (age of onset: 26) in her father; Heart disease in her brother and father; Hyperlipidemia in her brother and mother; Hypertension in her brother, father, and mother; Mental illness in her son; Multiple sclerosis in her daughter; Schizophrenia in her son; Stroke (age of onset: 58) in her mother. No Known Allergies    Outpatient Encounter Prescriptions as of 01/19/2014  Medication Sig  . aspirin 81 MG tablet Take 81 mg by mouth daily.    . calcium carbonate (TUMS) 500 MG chewable tablet as needed.    . cetirizine (ZYRTEC) 10 MG tablet Take 10 mg by mouth at bedtime as needed.    . chlorthalidone (HYGROTON) 25 MG tablet TAKE 1 TABLET BY MOUTH EVERY DAY  . cycloSPORINE (RESTASIS) 0.05 % ophthalmic emulsion Place 1 drop into both eyes 2 (two) times daily.  . diphenhydramine-acetaminophen (TYLENOL PM) 25-500 MG TABS Take 1 tablet by mouth at bedtime as needed.    Marland Kitchen losartan (COZAAR) 50 MG tablet Take 1 tablet (50 mg total) by mouth daily.  . Multiple Minerals-Vitamins (CITRACAL PLUS PO) Take 1 tablet by mouth daily.    . nebivolol (BYSTOLIC) 10 MG tablet  Take 0.5 tablets (5 mg total) by mouth daily.  . nisoldipine (SULAR) 34 MG 24 hr tablet TAKE 1 TABLET EVERY DAY  . omeprazole (PRILOSEC) 20 MG capsule Take 40 mg by mouth daily.   Marland Kitchen OVER THE COUNTER MEDICATION MegaRed- 1 daily  . ranitidine (ZANTAC) 300 MG tablet Take 1 tablet (300 mg total) by mouth at bedtime.  . trospium (SANCTURA) 20 MG tablet Take 20 mg by mouth daily.    Marland Kitchen zoster vaccine live, PF, (ZOSTAVAX) 59563 UNT/0.65ML injection Inject 19,400 Units into the skin once.     REVIEW OF SYSTEMS:  All other systems reviewed and negative except where noted in the History of Present Illness.   PHYSICAL EXAM: BP 138/70 mmHg  Pulse 60  Ht 5'  4.25" (1.632 m)  Wt 171 lb 6 oz (77.735 kg)  BMI 29.19 kg/m2 General: Well developed white female in no acute distress Head: Normocephalic and atraumatic Eyes:  Sclerae anicteric, conjunctiva pink. Ears: Normal auditory acuity Lungs: Clear throughout to auscultation Heart: Regular rate and rhythm Abdomen: Soft, non-distended.  Normal bowel sounds.  Non-tender. Musculoskeletal: Symmetrical with no gross deformities  Skin: No lesions on visible extremities Extremities: No edema  Neurological: Alert oriented x 4, grossly non-focal Psychological:  Alert and cooperative. Normal mood and affect  ASSESSMENT AND PLAN: -Cough, atypical chest pain, GERD, and nausea:  Will schedule EGD with Dr. Deatra Ina for further evaluation.  The risks, benefits, and alternatives were discussed with the patient and she consents to proceed.  Continue omeprazole 20 mg BID for now along with Zantac 300 mg at bedtime since this regimen was recently changed/increased.  GERD dietary measures.

## 2014-01-19 NOTE — Patient Instructions (Signed)
You have been scheduled for an endoscopy. Please follow written instructions given to you at your visit today. If you use inhalers (even only as needed), please bring them with you on the day of your procedure. Your physician has requested that you go to www.[email or web address].com and enter the access code given to you at your visit today(SENT TO YOUR E-MAIL). This web site gives a general overview about your procedure. However, you should still follow specific instructions given to you by our office regarding your preparation for the procedure.

## 2014-01-27 ENCOUNTER — Ambulatory Visit (INDEPENDENT_AMBULATORY_CARE_PROVIDER_SITE_OTHER): Payer: Medicare Other

## 2014-01-27 VITALS — BP 145/76 | HR 58

## 2014-01-27 DIAGNOSIS — I10 Essential (primary) hypertension: Secondary | ICD-10-CM

## 2014-01-27 NOTE — Progress Notes (Signed)
No changes for now.

## 2014-01-27 NOTE — Progress Notes (Signed)
Pre visit review using our clinic review tool, if applicable. No additional management support is needed unless otherwise documented below in the visit note. 

## 2014-01-27 NOTE — Progress Notes (Signed)
Pt has a had recent medication change and came to clinic today for blood pressure check.  Blood pressure was assessed and recorded.  Blood pressure is much improved.  Pt had no complaints and did not appear to be in distress.  Blood pressure was reassessed due to still being greater than 140/80.  Both recordings were routed to Dr. Charlett Blake for review.  Pt was advised that if any changes were necessary we would notify her.  Pt stated understanding and was escorted to checkout.

## 2014-02-04 ENCOUNTER — Encounter: Payer: Self-pay | Admitting: Gastroenterology

## 2014-02-14 ENCOUNTER — Other Ambulatory Visit: Payer: Self-pay | Admitting: Family Medicine

## 2014-03-06 HISTORY — PX: UPPER GASTROINTESTINAL ENDOSCOPY: SHX188

## 2014-03-13 ENCOUNTER — Encounter: Payer: Medicare Other | Admitting: Gastroenterology

## 2014-03-24 ENCOUNTER — Other Ambulatory Visit: Payer: Self-pay | Admitting: Family Medicine

## 2014-03-25 NOTE — Telephone Encounter (Signed)
Patient due for office visit to follow-up on blood pressure.  Called patient to make appointment, and patient requested that it be made after her endoscopy so that she can discuss results with Dr. Charlett Blake.  Per patient request, appointment scheduled for 04/16/14.  Rx refilled.   eal

## 2014-04-07 ENCOUNTER — Telehealth: Payer: Self-pay | Admitting: *Deleted

## 2014-04-07 NOTE — Telephone Encounter (Signed)
Prior authorization for nisoldipine initiated. Awaiting determination. JG//CMA

## 2014-04-09 ENCOUNTER — Encounter: Payer: Self-pay | Admitting: Gastroenterology

## 2014-04-09 ENCOUNTER — Ambulatory Visit (AMBULATORY_SURGERY_CENTER): Payer: Medicare Other | Admitting: Gastroenterology

## 2014-04-09 VITALS — BP 160/65 | HR 61 | Temp 97.3°F | Resp 17 | Ht 64.0 in | Wt 171.0 lb

## 2014-04-09 DIAGNOSIS — R110 Nausea: Secondary | ICD-10-CM

## 2014-04-09 DIAGNOSIS — R05 Cough: Secondary | ICD-10-CM

## 2014-04-09 DIAGNOSIS — K222 Esophageal obstruction: Secondary | ICD-10-CM

## 2014-04-09 DIAGNOSIS — R059 Cough, unspecified: Secondary | ICD-10-CM

## 2014-04-09 DIAGNOSIS — K449 Diaphragmatic hernia without obstruction or gangrene: Secondary | ICD-10-CM

## 2014-04-09 MED ORDER — SODIUM CHLORIDE 0.9 % IV SOLN: 500.0000 mL | INTRAVENOUS | Status: DC

## 2014-04-09 NOTE — Op Note (Signed)
[place]  [address]. [place], [address]   ENDOSCOPY PROCEDURE REPORT  PATIENT: Erika, [name]  MR#: #[id number] BIRTHDATE: January 19, 1946 , 63  yrs. old GENDER: female ENDOSCOPIST: Inda Castle, MD REFERRED BY:  Willette Alma, M.D. PROCEDURE DATE:  04/09/2014 PROCEDURE:  EGD, diagnostic ASA CLASS:     Class II INDICATIONS:  cough. MEDICATIONS: Monitored anesthesia care, Propofol 100 mg IV, lidocaine 40 mg IV, and Simethicone 40mg  PO TOPICAL ANESTHETIC:  DESCRIPTION OF PROCEDURE: After the risks benefits and alternatives of the procedure were thoroughly explained, informed consent was obtained.  The LB [id number] endoscope was introduced through the mouth and advanced to the second portion of the duodenum , Without limitations.  The instrument was slowly withdrawn as the mucosa was fully examined.    ESOPHAGUS: There was a peptic stricture at the gastroesophageal junction.  The stricture was easily traversable.   Except for the findings listed the EGD was otherwise normal.   STOMACH: A 4 cm hiatal hernia was noted.  Retroflexed views revealed no abnormalities.     The scope was then withdrawn from the patient and the procedure completed.  COMPLICATIONS: There were no immediate complications.  ENDOSCOPIC IMPRESSION: 1.   There was a early stricture at the gastroesophageal junction 2.   EGD was otherwise normal 3.   4 cm hiatal hernia  RECOMMENDATIONS: Continue current medications  (cough is improved)  REPEAT EXAM:  eSigned:  Inda Castle, MD 04/09/2014 9:02 AM    CC:

## 2014-04-09 NOTE — Telephone Encounter (Signed)
PA denied for nisoldipine. Alternatives include amlodipine and nisedipine. Please advise. JG//CMA

## 2014-04-09 NOTE — Patient Instructions (Signed)

## 2014-04-09 NOTE — Progress Notes (Signed)
Stable to RR 

## 2014-04-09 NOTE — Telephone Encounter (Signed)
Let patient know they have denied payment and see if she has ever taken Amlodipine? We can switch to that if she is willing

## 2014-04-10 ENCOUNTER — Telehealth: Payer: Self-pay | Admitting: *Deleted

## 2014-04-10 ENCOUNTER — Other Ambulatory Visit: Payer: Self-pay | Admitting: Family Medicine

## 2014-04-10 ENCOUNTER — Telehealth: Payer: Self-pay

## 2014-04-10 MED ORDER — AMLODIPINE BESYLATE 5 MG PO TABS: 5.0000 mg | ORAL_TABLET | Freq: Every day | ORAL | Status: DC

## 2014-04-10 NOTE — Addendum Note (Signed)
Addended by: Murtis Sink A on: 04/10/2014 02:20 PM   Modules accepted: Orders, Medications

## 2014-04-10 NOTE — Telephone Encounter (Signed)
Pt aware.

## 2014-04-10 NOTE — Telephone Encounter (Signed)
Prior authorization submitted for trospium (SANCTURA) 20 MG tablet. Awaiting determination. JG//CMA

## 2014-04-10 NOTE — Telephone Encounter (Signed)
Patient stated she is willing to switch to amlodipine.

## 2014-04-10 NOTE — Telephone Encounter (Signed)
D/C Nisoldipine and start Amlodipine 5 mg po daily and she needs a BP check in 2-4 weeks.

## 2014-04-10 NOTE — Telephone Encounter (Signed)
  Follow up Call-  Call back number 04/09/2014  Post procedure Call Back phone  # [phone]  Permission to leave phone message Yes     Patient questions:  Do you have a fever, pain , or abdominal swelling? No. Pain Score  0 *  Have you tolerated food without any problems? Yes.    Have you been able to return to your normal activities? Yes.    Do you have any questions about your discharge instructions: Diet   No. Medications  No. Follow up visit  No.  Do you have questions or concerns about your Care? No.  Actions: * If pain score is 4 or above: No action needed, pain <4.  No problems per the pt. maw

## 2014-04-16 ENCOUNTER — Encounter: Payer: Self-pay | Admitting: Family Medicine

## 2014-04-16 ENCOUNTER — Ambulatory Visit (INDEPENDENT_AMBULATORY_CARE_PROVIDER_SITE_OTHER): Payer: Medicare Other | Admitting: Family Medicine

## 2014-04-16 VITALS — BP 152/72 | HR 69 | Temp 98.4°F | Ht 66.0 in | Wt 177.4 lb

## 2014-04-16 DIAGNOSIS — R05 Cough: Secondary | ICD-10-CM

## 2014-04-16 DIAGNOSIS — T7840XA Allergy, unspecified, initial encounter: Secondary | ICD-10-CM

## 2014-04-16 DIAGNOSIS — R059 Cough, unspecified: Secondary | ICD-10-CM

## 2014-04-16 DIAGNOSIS — E663 Overweight: Secondary | ICD-10-CM

## 2014-04-16 DIAGNOSIS — I10 Essential (primary) hypertension: Secondary | ICD-10-CM

## 2014-04-16 DIAGNOSIS — K219 Gastro-esophageal reflux disease without esophagitis: Secondary | ICD-10-CM

## 2014-04-16 MED ORDER — CHLORTHALIDONE 25 MG PO TABS: 25.0000 mg | ORAL_TABLET | Freq: Every day | ORAL | Status: DC

## 2014-04-16 MED ORDER — METOPROLOL SUCCINATE ER 50 MG PO TB24: 50.0000 mg | ORAL_TABLET | Freq: Every day | ORAL | Status: DC

## 2014-04-16 MED ORDER — OMEPRAZOLE 20 MG PO CPDR: 20.0000 mg | DELAYED_RELEASE_CAPSULE | Freq: Every day | ORAL | Status: DC

## 2014-04-16 NOTE — Progress Notes (Signed)
Pre visit review using our clinic review tool, if applicable. No additional management support is needed unless otherwise documented below in the visit note. 

## 2014-04-16 NOTE — Patient Instructions (Addendum)
[employer] by [place] or [place] makes a multistrain probiotic and can order on [email or web address] Continue Zyrtec and Zantac for now   Needs nurse appt in 4-6 weeks for BP check Doctor appt in 3-4 months  Food Choices for Gastroesophageal Reflux Disease When you have gastroesophageal reflux disease (GERD), the foods you eat and your eating habits are very important. Choosing the right foods can help ease the discomfort of GERD. WHAT GENERAL GUIDELINES DO I NEED TO FOLLOW?  Choose fruits, vegetables, whole grains, low-fat dairy products, and low-fat meat, fish, and poultry.  Limit fats such as oils, salad dressings, butter, nuts, and avocado.  Keep a food diary to identify foods that cause symptoms.  Avoid foods that cause reflux. These may be different for different people.  Eat frequent small meals instead of three large meals each day.  Eat your meals slowly, in a relaxed setting.  Limit fried foods.  Cook foods using methods other than frying.  Avoid drinking alcohol.  Avoid drinking large amounts of liquids with your meals.  Avoid bending over or lying down until 2-3 hours after eating. WHAT FOODS ARE NOT RECOMMENDED? The following are some foods and drinks that may worsen your symptoms: Vegetables Tomatoes. Tomato juice. Tomato and spaghetti sauce. Chili peppers. Onion and garlic. Horseradish. Fruits Oranges, grapefruit, and lemon (fruit and juice). Meats High-fat meats, fish, and poultry. This includes hot dogs, ribs, ham, sausage, salami, and bacon. Dairy Whole milk and chocolate milk. Sour cream. Cream. Butter. Ice cream. Cream cheese.  Beverages Coffee and tea, with or without caffeine. Carbonated beverages or energy drinks. Condiments Hot sauce. Barbecue sauce.  Sweets/Desserts Chocolate and cocoa. Donuts. Peppermint and spearmint. Fats and Oils High-fat foods, including [place] fries and potato chips. Other Vinegar.  Strong spices, such as black pepper, white pepper, red pepper, cayenne, curry powder, cloves, ginger, and chili powder. The items listed above may not be a complete list of foods and beverages to avoid. Contact your dietitian for more information. Document Released: 02/20/2005 Document Revised: 02/25/2013 Document Reviewed: 12/25/2012 [facility] Patient Information 2015 [place], [place]. This information is not intended to replace advice given to you by your health care provider. Make sure you discuss any questions you have with your health care provider.

## 2014-04-20 ENCOUNTER — Encounter: Payer: Self-pay | Admitting: Family Medicine

## 2014-04-20 NOTE — Assessment & Plan Note (Signed)
Improving with changes to meds and treatment or reflux

## 2014-04-20 NOTE — Assessment & Plan Note (Addendum)
Poorly controlled will alter medications, encouraged DASH diet, minimize caffeine and obtain adequate sleep. Report concerning symptoms and follow up as directed and as needed. Add Chlorthalidone, insurance has made her switch her beta blocker and her CCB but she has tolerated both of those changes

## 2014-04-20 NOTE — Progress Notes (Signed)
Erika [name]  [id number] 1946/03/03 04/20/2014      Progress Note-Follow Up  Subjective  Chief Complaint  Chief Complaint  Patient presents with  . Follow-up    hypertension and endoscopy results    HPI  Patient is a 69 y.o. female in today for routine medical care. She needs to follow-up on medication changes that were forced by her insurance company. They have forced her to change her calcium channel blocker and her beta blocker. Overall she has tolerated the changes. She denies any headaches, constipation or increased fatigue. No other recent illness. She acknowledges by treating her reflux and her allergies her cough is improving. She continues to have a cough but it is not debilitating or keeping her up at night. No recent illness. Denies CP/palp/SOB/HA/congestion/fevers/GI or GU c/o. Taking meds as prescribed  Past Medical History  Diagnosis Date  . Diverticulosis of colon     colonoscopy 04/14/1999  . Obesity   . Chicken pox as a child  . Measles as a child  . Mumps as a child  . GERD (gastroesophageal reflux disease)   . Hyperlipidemia   . Hypertension   . Osteopenia   . History of fibrocystic disease of breast   . Bronchitis 04/02/2011  . Arthritis 04/02/2011  . IBS (irritable bowel syndrome) 04/02/2011  . Overactive bladder 04/02/2011  . Left shoulder strain 06/14/2011  . Dry mouth 03/13/2012  . Preventative health care 03/13/2012  . Allergic state 06/24/2012  . Hypokalemia 06/24/2012  . Contusion of leg, left 08/08/2012  . Anterior cervical lymphadenopathy 02/08/2013  . Shoulder pain, bilateral 06/14/2011  . Overweight     Past Surgical History  Procedure Laterality Date  . Breast reduction surgery  1986  . Bunionectomy Bilateral 1980  . Mouth surgery  01-10-13    negative  . Hammer toe surgery Right 12-03-13    foot  . Tonsillectomy      Family History  Problem Relation Age of Onset  . Stroke Mother 61  . Hyperlipidemia Mother   . Hypertension Mother   .  Heart attack Father 63  . Hypertension Father   . Heart disease Father   . Hypertension Brother   . Aneurysm Brother     aortic  . Hyperlipidemia Brother   . Heart disease Brother     aortic aneurysm rupture  . Multiple sclerosis Daughter   . Alcohol abuse Son   . Schizophrenia Son   . Bipolar disorder Son   . Mental illness Son     schizophrenic, bipolar    History   Social History  . Marital Status: Married    Spouse Name: N/A  . Number of Children: 4  . Years of Education: N/A   Occupational History  . dental hygenist    Social History Main Topics  . Smoking status: Never Smoker   . Smokeless tobacco: Never Used  . Alcohol Use: No  . Drug Use: No  . Sexual Activity: Yes   Other Topics Concern  . Not on file   Social History Narrative   4 children 2 are adopted    Current Outpatient Prescriptions on File Prior to Visit  Medication Sig Dispense Refill  . amLODipine (NORVASC) 5 MG tablet Take 1 tablet (5 mg total) by mouth daily. 30 tablet 1  . aspirin 81 MG tablet Take 81 mg by mouth daily.      Marland Kitchen BYSTOLIC 10 MG tablet TAKE 1/2 TABLET EVERY DAY 30 tablet 0  .  calcium carbonate (TUMS) 500 MG chewable tablet as needed.      . cetirizine (ZYRTEC) 10 MG tablet Take 10 mg by mouth at bedtime as needed.      . cycloSPORINE (RESTASIS) 0.05 % ophthalmic emulsion Place 1 drop into both eyes 2 (two) times daily.    Marland Kitchen losartan (COZAAR) 50 MG tablet Take 1 tablet (50 mg total) by mouth daily. 30 tablet 3  . Multiple Minerals-Vitamins (CITRACAL PLUS PO) Take 1 tablet by mouth daily.      Marland Kitchen OVER THE COUNTER MEDICATION MegaRed- 1 daily    . ranitidine (ZANTAC) 300 MG tablet Take 1 tablet (300 mg total) by mouth at bedtime. 30 tablet 3  . trospium (SANCTURA) 20 MG tablet Take 20 mg by mouth daily.      . diphenhydramine-acetaminophen (TYLENOL PM) 25-500 MG TABS Take 1 tablet by mouth at bedtime as needed.       No current facility-administered medications on file prior to  visit.    No Known Allergies  Review of Systems  Review of Systems  Constitutional: Negative for fever and malaise/fatigue.  HENT: Negative for congestion.   Eyes: Negative for discharge.  Respiratory: Positive for cough. Negative for shortness of breath.   Cardiovascular: Negative for chest pain, palpitations and leg swelling.  Gastrointestinal: Negative for nausea, abdominal pain and diarrhea.  Genitourinary: Negative for dysuria.  Musculoskeletal: Negative for falls.  Skin: Negative for rash.  Neurological: Negative for loss of consciousness and headaches.  Endo/Heme/Allergies: Negative for polydipsia.  Psychiatric/Behavioral: Negative for depression and suicidal ideas. The patient is not nervous/anxious and does not have insomnia.     Objective  BP 152/72 mmHg  Pulse 69  Temp(Src) 98.4 F (36.9 C) (Oral)  Ht 5\' 6"  (1.676 m)  Wt 177 lb 6 oz (80.457 kg)  BMI 28.64 kg/m2  SpO2 96%  Physical Exam  Physical Exam  Constitutional: She is oriented to person, place, and time and well-developed, well-nourished, and in no distress. No distress.  HENT:  Head: Normocephalic and atraumatic.  Eyes: Conjunctivae are normal.  Neck: Neck supple. No thyromegaly present.  Cardiovascular: Normal rate, regular rhythm and normal heart sounds.   No murmur heard. Pulmonary/Chest: Effort normal and breath sounds normal. She has no wheezes.  Abdominal: She exhibits no distension and no mass.  Musculoskeletal: She exhibits no edema.  Lymphadenopathy:    She has no cervical adenopathy.  Neurological: She is alert and oriented to person, place, and time.  Skin: Skin is warm and dry. No rash noted. She is not diaphoretic.  Psychiatric: Memory, affect and judgment normal.    Lab Results  Component Value Date   TSH 0.77 01/12/2014   Lab Results  Component Value Date   WBC 5.4 01/12/2014   HGB 14.1 01/12/2014   HCT 42.0 01/12/2014   MCV 93.4 01/12/2014   PLT 308.0 01/12/2014   Lab  Results  Component Value Date   CREATININE 0.9 01/12/2014   BUN 12 01/12/2014   NA 138 01/12/2014   K 3.6 01/12/2014   CL 102 01/12/2014   CO2 28 01/12/2014   Lab Results  Component Value Date   ALT 28 01/12/2014   AST 23 01/12/2014   ALKPHOS 66 01/12/2014   BILITOT 0.7 01/12/2014   Lab Results  Component Value Date   CHOL 237* 01/12/2014   Lab Results  Component Value Date   HDL 39.30 01/12/2014   Lab Results  Component Value Date   LDLCALC 171* 01/12/2014  Lab Results  Component Value Date   TRIG 134.0 01/12/2014   Lab Results  Component Value Date   CHOLHDL 6 01/12/2014     Assessment & Plan  Hypertension Poorly controlled will alter medications, encouraged DASH diet, minimize caffeine and obtain adequate sleep. Report concerning symptoms and follow up as directed and as needed. Add Chlorthalidone, insurance has made her switch her beta blocker and her CCB but she has tolerated both of those changes   GERD Avoid offending foods, take probiotics. Do not eat large meals in late evening and consider raising head of bed. Continue current meds.   Cough Improving with changes to meds and treatment or reflux   Allergic state Encouraged daily antihistamines to manage cough and congestion, may use bide as needed.   Overweight Encouraged DASH diet, decrease po intake and increase exercise as tolerated. Needs 7-8 hours of sleep nightly. Avoid trans fats, eat small, frequent meals every 4-5 hours with lean proteins, complex carbs and healthy fats. Minimize simple carbs

## 2014-04-20 NOTE — Assessment & Plan Note (Signed)
Avoid offending foods, take probiotics. Do not eat large meals in late evening and consider raising head of bed. Continue current meds 

## 2014-04-20 NOTE — Assessment & Plan Note (Signed)
Encouraged daily antihistamines to manage cough and congestion, may use bide as needed.

## 2014-04-20 NOTE — Assessment & Plan Note (Signed)
Encouraged DASH diet, decrease po intake and increase exercise as tolerated. Needs 7-8 hours of sleep nightly. Avoid trans fats, eat small, frequent meals every 4-5 hours with lean proteins, complex carbs and healthy fats. Minimize simple carbs 

## 2014-04-24 NOTE — Telephone Encounter (Signed)
Late entry.  PA approved on 04/13/2014 effective through 04/11/2015. JG//CMA

## 2014-05-12 ENCOUNTER — Ambulatory Visit: Payer: Medicare Other | Admitting: *Deleted

## 2014-05-12 ENCOUNTER — Telehealth: Payer: Self-pay | Admitting: *Deleted

## 2014-05-12 VITALS — BP 132/70 | HR 71

## 2014-05-12 DIAGNOSIS — I10 Essential (primary) hypertension: Secondary | ICD-10-CM

## 2014-05-12 NOTE — Telephone Encounter (Signed)
FYI her BP was 132/70 with HR 71- checked it twice manual and with the automatic also because she would not believe it!

## 2014-05-12 NOTE — Progress Notes (Deleted)
   Subjective:    Patient ID: Erika [name], female    DOB: 1945/03/11, 69 y.o.   MRN: [id number]  HPI    Review of Systems     Objective:   Physical Exam        Assessment & Plan:

## 2014-05-12 NOTE — Progress Notes (Signed)
Pre visit review using our clinic review tool, if applicable. No additional management support is needed unless otherwise documented below in the visit note. 

## 2014-05-13 ENCOUNTER — Other Ambulatory Visit: Payer: Self-pay | Admitting: Family Medicine

## 2014-05-26 ENCOUNTER — Other Ambulatory Visit: Payer: Self-pay | Admitting: Family Medicine

## 2014-05-26 NOTE — Telephone Encounter (Signed)
Med filled.  

## 2014-07-19 ENCOUNTER — Other Ambulatory Visit: Payer: Self-pay | Admitting: Family Medicine

## 2014-07-20 ENCOUNTER — Ambulatory Visit: Payer: Medicare Other | Admitting: Family Medicine

## 2014-07-24 ENCOUNTER — Other Ambulatory Visit: Payer: Self-pay | Admitting: Family Medicine

## 2014-07-24 ENCOUNTER — Ambulatory Visit (INDEPENDENT_AMBULATORY_CARE_PROVIDER_SITE_OTHER): Payer: Medicare Other | Admitting: Family Medicine

## 2014-07-24 ENCOUNTER — Encounter: Payer: Self-pay | Admitting: Family Medicine

## 2014-07-24 VITALS — BP 138/74 | HR 65 | Temp 97.9°F | Wt 176.0 lb

## 2014-07-24 DIAGNOSIS — E785 Hyperlipidemia, unspecified: Secondary | ICD-10-CM | POA: Diagnosis not present

## 2014-07-24 DIAGNOSIS — E871 Hypo-osmolality and hyponatremia: Secondary | ICD-10-CM

## 2014-07-24 DIAGNOSIS — K219 Gastro-esophageal reflux disease without esophagitis: Secondary | ICD-10-CM | POA: Diagnosis not present

## 2014-07-24 DIAGNOSIS — E782 Mixed hyperlipidemia: Secondary | ICD-10-CM

## 2014-07-24 DIAGNOSIS — E876 Hypokalemia: Secondary | ICD-10-CM | POA: Diagnosis not present

## 2014-07-24 DIAGNOSIS — I10 Essential (primary) hypertension: Secondary | ICD-10-CM

## 2014-07-24 DIAGNOSIS — R5383 Other fatigue: Secondary | ICD-10-CM

## 2014-07-24 DIAGNOSIS — E663 Overweight: Secondary | ICD-10-CM

## 2014-07-24 LAB — LIPID PANEL
Cholesterol: 234 mg/dL — ABNORMAL HIGH (ref 0–200)
HDL: 42.1 mg/dL (ref >39.00)
NONHDL: 191.9
Total CHOL/HDL Ratio: 6
Triglycerides: 266 mg/dL — ABNORMAL HIGH (ref 0.0–149.0)
VLDL: 53.2 mg/dL — ABNORMAL HIGH (ref 0.0–40.0)

## 2014-07-24 LAB — COMPREHENSIVE METABOLIC PANEL
ALK PHOS: 83 U/L (ref 39–117)
ALT: 21 U/L (ref 0–35)
AST: 22 U/L (ref 0–37)
Albumin: 4 g/dL (ref 3.5–5.2)
BILIRUBIN TOTAL: 0.7 mg/dL (ref 0.2–1.2)
BUN: 13 mg/dL (ref 6–23)
CO2: 32 mEq/L (ref 19–32)
Calcium: 9.2 mg/dL (ref 8.4–10.5)
Chloride: 89 mEq/L — ABNORMAL LOW (ref 96–112)
Creatinine, Ser: 0.85 mg/dL (ref 0.40–1.20)
GFR: 70.58 mL/min (ref >60.00)
Glucose, Bld: 104 mg/dL — ABNORMAL HIGH (ref 70–99)
Potassium: 3.1 mEq/L — ABNORMAL LOW (ref 3.5–5.1)
Sodium: 128 mEq/L — ABNORMAL LOW (ref 135–145)
Total Protein: 7.4 g/dL (ref 6.0–8.3)

## 2014-07-24 LAB — TSH: TSH: 0.79 u[IU]/mL (ref 0.35–4.50)

## 2014-07-24 LAB — CBC
HEMATOCRIT: 40.6 % (ref 36.0–46.0)
Hemoglobin: 14.2 g/dL (ref 12.0–15.0)
MCHC: 34.9 g/dL (ref 30.0–36.0)
MCV: 88.1 fl (ref 78.0–100.0)
Platelets: 309 10*3/uL (ref 150.0–400.0)
RBC: 4.6 Mil/uL (ref 3.87–5.11)
RDW: 12 % (ref 11.5–15.5)
WBC: 6.1 10*3/uL (ref 4.0–10.5)

## 2014-07-24 LAB — LDL CHOLESTEROL, DIRECT: LDL DIRECT: 163 mg/dL

## 2014-07-24 MED ORDER — NEBIVOLOL HCL 10 MG PO TABS: 5.0000 mg | ORAL_TABLET | Freq: Every day | ORAL | Status: DC

## 2014-07-24 NOTE — Progress Notes (Signed)
Pre visit review using our clinic review tool, if applicable. No additional management support is needed unless otherwise documented below in the visit note. 

## 2014-07-24 NOTE — Patient Instructions (Signed)

## 2014-07-27 ENCOUNTER — Telehealth: Payer: Self-pay | Admitting: Family Medicine

## 2014-07-27 ENCOUNTER — Ambulatory Visit: Payer: Medicare Other | Admitting: Family Medicine

## 2014-07-27 ENCOUNTER — Other Ambulatory Visit: Payer: Self-pay | Admitting: Family Medicine

## 2014-07-27 MED ORDER — ATORVASTATIN CALCIUM 10 MG PO TABS: ORAL_TABLET | ORAL | Status: DC

## 2014-07-27 NOTE — Telephone Encounter (Signed)
Caller name: Nickia Boesen Relationship to patient: self Can be reached: [phone]  Reason for call: Pt called stating she just missed a call. Please call her back.

## 2014-07-28 ENCOUNTER — Other Ambulatory Visit (INDEPENDENT_AMBULATORY_CARE_PROVIDER_SITE_OTHER): Payer: Medicare Other

## 2014-07-28 DIAGNOSIS — E871 Hypo-osmolality and hyponatremia: Secondary | ICD-10-CM

## 2014-07-28 LAB — COMPREHENSIVE METABOLIC PANEL
ALK PHOS: 75 U/L (ref 39–117)
ALT: 18 U/L (ref 0–35)
AST: 19 U/L (ref 0–37)
Albumin: 3.9 g/dL (ref 3.5–5.2)
BUN: 9 mg/dL (ref 6–23)
CHLORIDE: 96 meq/L (ref 96–112)
CO2: 33 mEq/L — ABNORMAL HIGH (ref 19–32)
CREATININE: 0.78 mg/dL (ref 0.40–1.20)
Calcium: 9.3 mg/dL (ref 8.4–10.5)
GFR: 77.94 mL/min (ref >60.00)
GLUCOSE: 97 mg/dL (ref 70–99)
Potassium: 3.3 mEq/L — ABNORMAL LOW (ref 3.5–5.1)
Sodium: 133 mEq/L — ABNORMAL LOW (ref 135–145)
TOTAL PROTEIN: 7 g/dL (ref 6.0–8.3)
Total Bilirubin: 0.6 mg/dL (ref 0.2–1.2)

## 2014-07-30 ENCOUNTER — Telehealth: Payer: Self-pay

## 2014-07-30 DIAGNOSIS — E876 Hypokalemia: Secondary | ICD-10-CM

## 2014-07-30 MED ORDER — POTASSIUM CHLORIDE CRYS ER 20 MEQ PO TBCR: 20.0000 meq | EXTENDED_RELEASE_TABLET | Freq: Every day | ORAL | Status: DC

## 2014-07-30 NOTE — Addendum Note (Signed)
Addended by: Reino Bellis on: 07/30/2014 01:27 PM   Modules accepted: Orders

## 2014-07-30 NOTE — Telephone Encounter (Signed)
Should patient restart chlorthalidone (HYGROTON) 25 MG tablet? Per Dr. Charlett Blake, do not restart. Follow up in August. Appt scheduled.

## 2014-08-02 ENCOUNTER — Encounter: Payer: Self-pay | Admitting: Family Medicine

## 2014-08-02 DIAGNOSIS — E871 Hypo-osmolality and hyponatremia: Secondary | ICD-10-CM | POA: Insufficient documentation

## 2014-08-02 DIAGNOSIS — R5383 Other fatigue: Secondary | ICD-10-CM | POA: Insufficient documentation

## 2014-08-02 HISTORY — DX: Hypo-osmolality and hyponatremia: E87.1

## 2014-08-02 HISTORY — DX: Other fatigue: R53.83

## 2014-08-02 NOTE — Assessment & Plan Note (Signed)
She reports she sleeps well and does not wake up routinely. She wakes rested but fatigues easily. 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 She will notify us if symptoms worsen.

## 2014-08-02 NOTE — Progress Notes (Signed)
Erika [name] [id number] [birth year] 08/02/2014      Progress Note New Patient  Subjective  Chief Complaint  Chief Complaint  Patient presents with  . Follow-up    HPI  Patient is a 69 year old female in today for routine medical care.  She is in today for follow-up. Notes that her heartburn is generally well controlled on a PPI. She is largely complaining of fatigue. She says she sleeps well throughout the night and while she snores slightly she does not waken frequently. She feels rested in the morning without headache but then fatigues throughout the day. She does babysit numerous for her young grandchildren every day. No recent illness or acute concerns. Denies CP/palp/SOB/HA/congestion/fevers/GI or GU c/o. Taking meds as prescribed Past Medical History  Diagnosis Date  . Diverticulosis of colon     colonoscopy 04/14/1999  . Obesity   . Chicken pox as a child  . Measles as a child  . Mumps as a child  . GERD (gastroesophageal reflux disease)   . Hyperlipidemia   . Hypertension   . Osteopenia   . History of fibrocystic disease of breast   . Bronchitis 04/02/2011  . Arthritis 04/02/2011  . IBS (irritable bowel syndrome) 04/02/2011  . Overactive bladder 04/02/2011  . Left shoulder strain 06/14/2011  . Dry mouth 03/13/2012  . Preventative health care 03/13/2012  . Allergic state 06/24/2012  . Hypokalemia 06/24/2012  . Contusion of leg, left 08/08/2012  . Anterior cervical lymphadenopathy 02/08/2013  . Shoulder pain, bilateral 06/14/2011  . Overweight     Past Surgical History  Procedure Laterality Date  . Breast reduction surgery  1986  . Bunionectomy Bilateral 1980  . Mouth surgery  01-10-13    negative  . Hammer toe surgery Right 12-03-13    foot  . Tonsillectomy      Family History  Problem Relation Age of Onset  . Stroke Mother 7  . Hyperlipidemia Mother   . Hypertension Mother   . Heart attack Father 70  . Hypertension Father   . Heart disease Father   .  Hypertension Brother   . Aneurysm Brother     aortic  . Hyperlipidemia Brother   . Heart disease Brother     aortic aneurysm rupture  . Multiple sclerosis Daughter   . Alcohol abuse Son   . Schizophrenia Son   . Bipolar disorder Son   . Mental illness Son     schizophrenic, bipolar    History   Social History  . Marital Status: Married    Spouse Name: N/A  . Number of Children: 4  . Years of Education: N/A   Occupational History  . dental hygenist    Social History Main Topics  . Smoking status: Never Smoker   . Smokeless tobacco: Never Used  . Alcohol Use: No  . Drug Use: No  . Sexual Activity: Yes   Other Topics Concern  . Not on file   Social History Narrative   4 children 2 are adopted    Current Outpatient Prescriptions on File Prior to Visit  Medication Sig Dispense Refill  . amLODipine (NORVASC) 5 MG tablet TAKE 1 TABLET (5 MG TOTAL) BY MOUTH DAILY. 30 tablet 1  . aspirin 81 MG tablet Take 81 mg by mouth daily.      . calcium carbonate (TUMS) 500 MG chewable tablet as needed.      . cetirizine (ZYRTEC) 10 MG tablet Take 10 mg by mouth at bedtime as  needed.      . chlorthalidone (HYGROTON) 25 MG tablet Take 1 tablet (25 mg total) by mouth daily. 90 tablet 3  . cycloSPORINE (RESTASIS) 0.05 % ophthalmic emulsion Place 1 drop into both eyes 2 (two) times daily.    . metoprolol succinate (TOPROL-XL) 50 MG 24 hr tablet TAKE 1 TABLET BY MOUTH ONCE A DAY WITH OR IMMEDIATELY FOLLOWING A MEAL 30 tablet 2  . Multiple Minerals-Vitamins (CITRACAL PLUS PO) Take 1 tablet by mouth daily.      . ranitidine (ZANTAC) 300 MG tablet Take 1 tablet (300 mg total) by mouth at bedtime. 30 tablet 3  . trospium (SANCTURA) 20 MG tablet Take 20 mg by mouth daily.      . diphenhydramine-acetaminophen (TYLENOL PM) 25-500 MG TABS Take 1 tablet by mouth at bedtime as needed.      Marland Kitchen losartan (COZAAR) 50 MG tablet TAKE 1 TABLET (50 MG TOTAL) BY MOUTH DAILY. 30 tablet 6  . omeprazole  (PRILOSEC) 20 MG capsule Take 1 capsule (20 mg total) by mouth daily. (Patient not taking: Reported on 07/24/2014) 90 capsule 2  . OVER THE COUNTER MEDICATION MegaRed- 1 daily     No current facility-administered medications on file prior to visit.    No Known Allergies  Review of Systems  Review of Systems  Constitutional: Positive for malaise/fatigue. Negative for fever.  HENT: Negative for congestion.   Eyes: Negative for discharge.  Respiratory: Negative for shortness of breath.   Cardiovascular: Negative for chest pain, palpitations and leg swelling.  Gastrointestinal: Negative for nausea, abdominal pain and diarrhea.  Genitourinary: Negative for dysuria.  Musculoskeletal: Negative for falls.  Skin: Negative for rash.  Neurological: Negative for loss of consciousness and headaches.  Endo/Heme/Allergies: Negative for polydipsia.  Psychiatric/Behavioral: Negative for depression and suicidal ideas. The patient is not nervous/anxious and does not have insomnia.     Objective  BP 138/74 mmHg  Pulse 65  Temp(Src) 97.9 F (36.6 C) (Oral)  Wt 176 lb (79.833 kg)  SpO2 97%  Physical Exam  Physical Exam  Constitutional: She is oriented to person, place, and time and well-developed, well-nourished, and in no distress. No distress.  HENT:  Head: Normocephalic and atraumatic.  Eyes: Conjunctivae are normal.  Neck: Neck supple. No thyromegaly present.  Cardiovascular: Normal rate, regular rhythm and normal heart sounds.   No murmur heard. Pulmonary/Chest: Effort normal and breath sounds normal. She has no wheezes.  Abdominal: She exhibits no distension and no mass.  Musculoskeletal: She exhibits no edema.  Lymphadenopathy:    She has no cervical adenopathy.  Neurological: She is alert and oriented to person, place, and time.  Skin: Skin is warm and dry. No rash noted. She is not diaphoretic.  Psychiatric: Memory, affect and judgment normal.       Assessment &  Plan  Hypertension Well controlled, no changes to meds. Encouraged heart healthy diet such as the DASH diet and exercise as tolerated.    GERD Avoid offending foods, start probiotics. Do not eat large meals in late evening and consider raising head of bed. May use PPI daily but is encouraged to try skipping doses and see if her symptoms are still controlled   Hypokalemia Started on supplements and encouraged to continue in diet. Monitor   Hyperlipidemia, mixed encouraged heart healthy diet, avoid trans fats, minimize simple carbs and saturated fats. Increase exercise as tolerated   Overweight Encouraged DASH diet, decrease po intake and increase exercise as tolerated. Needs 7-8 hours  of sleep nightly. Avoid trans fats, eat small, frequent meals every 4-5 hours with lean proteins, complex carbs and healthy fats. Minimize simple carbs   Hyponatremia Encouraged to decrease fluid intake by one beverage daily and will monitor   Fatigue She reports she sleeps well and does not wake up routinely. She wakes rested but fatigues easily. 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 She will notify us if symptoms worsen.

## 2014-08-02 NOTE — Assessment & Plan Note (Signed)
Encouraged DASH diet, decrease po intake and increase exercise as tolerated. Needs 7-8 hours of sleep nightly. Avoid trans fats, eat small, frequent meals every 4-5 hours with lean proteins, complex carbs and healthy fats. Minimize simple carbs 

## 2014-08-02 NOTE — Assessment & Plan Note (Signed)
encouraged heart healthy diet, avoid trans fats, minimize simple carbs and saturated fats. Increase exercise as tolerated 

## 2014-08-02 NOTE — Assessment & Plan Note (Addendum)
Avoid offending foods, start probiotics. Do not eat large meals in late evening and consider raising head of bed. May use PPI daily but is encouraged to try skipping doses and see if her symptoms are still controlled

## 2014-08-02 NOTE — Assessment & Plan Note (Signed)
Started on supplements and encouraged to continue in diet. Monitor

## 2014-08-02 NOTE — Assessment & Plan Note (Signed)
Encouraged to decrease fluid intake by one beverage daily and will monitor

## 2014-08-02 NOTE — Assessment & Plan Note (Signed)
Well controlled, no changes to meds. Encouraged heart healthy diet such as the DASH diet and exercise as tolerated.  °

## 2014-08-17 ENCOUNTER — Other Ambulatory Visit: Payer: Medicare Other

## 2014-08-24 ENCOUNTER — Other Ambulatory Visit: Payer: Self-pay | Admitting: Family Medicine

## 2014-08-25 ENCOUNTER — Telehealth: Payer: Self-pay | Admitting: Family Medicine

## 2014-08-25 NOTE — Telephone Encounter (Signed)
Patient informed of PCP instructions and agree to schedule nurse visit.  Transferred to the front for appt.

## 2014-08-25 NOTE — Telephone Encounter (Signed)
She needs to come in for nurse visit bp check and pulse check and then we can adjust meds. Make sure we document what she has taken in past 48 hours when bp checked in next day or so

## 2014-08-25 NOTE — Telephone Encounter (Signed)
Caller name:Kamira Relation to pt: Call back number: [phone] Pharmacy:  Reason for call:   Patient states that she is still having high blood pressure and the pharmacist told her that she should increase the metropolol. She states that Insurance will not pay for bystolic.

## 2014-08-26 ENCOUNTER — Ambulatory Visit (INDEPENDENT_AMBULATORY_CARE_PROVIDER_SITE_OTHER): Payer: Medicare Other

## 2014-08-26 VITALS — BP 156/71 | HR 59 | Ht 65.0 in | Wt 178.6 lb

## 2014-08-26 DIAGNOSIS — I10 Essential (primary) hypertension: Secondary | ICD-10-CM

## 2014-08-26 NOTE — Progress Notes (Signed)
Pt states took blood pressure at drug store and it was 184/78.  She was told by pharmacist that she may need to increase metoprolol.  She said that she had a nurse friend retake her blood pressure yesterday and systolic was in the 390'Z.  Pt had some concerns about her blood pressure, so she came into office today to have it checked.    Today blood pressures are still elevated.  According to patient, she is taking Amlodpine 5 mg daily and Losartan 50 mg daily.  She was unsure about the metoprolol.  Pt states that she has been having more headaches recently, but she is unsure of whether or not it's due to her keeping her grandchildren.  She denied any other symptoms.  No acute distress was noted.  Chart routed to Dr. Charlett Blake for clinical consideration.   Pt has an upcoming appointment with Dr. Charlett Blake on 10/30/14.

## 2014-08-26 NOTE — Progress Notes (Signed)
bp still too hi, increase Losartan to 50 mg po bid, disp #60, 5 rf

## 2014-08-27 ENCOUNTER — Telehealth: Payer: Self-pay | Admitting: Family Medicine

## 2014-08-27 MED ORDER — LOSARTAN POTASSIUM 50 MG PO TABS: 50.0000 mg | ORAL_TABLET | Freq: Two times a day (BID) | ORAL | Status: DC

## 2014-08-27 NOTE — Telephone Encounter (Signed)
Patient informed already by RN

## 2014-08-27 NOTE — Telephone Encounter (Signed)
Caller name: Baudelia Schroepfer Relationship to patient: self Can be reached: [phone] Pharmacy:   Reason for call: Pt called to follow up from yesterday. She said that she is wondering if BP meds will be adjusted. Please call her back on cell #.

## 2014-08-27 NOTE — Progress Notes (Signed)
Pt notified and made aware of medication change.  Pt stated understanding and agreed with plan.  No further questions or concerns voiced.    New Rx sent to pharmacy.

## 2014-09-04 ENCOUNTER — Other Ambulatory Visit (INDEPENDENT_AMBULATORY_CARE_PROVIDER_SITE_OTHER): Payer: Medicare Other

## 2014-09-04 DIAGNOSIS — E876 Hypokalemia: Secondary | ICD-10-CM

## 2014-09-04 LAB — COMPLETE METABOLIC PANEL WITH GFR
ALT: 17 U/L (ref 0–35)
AST: 19 U/L (ref 0–37)
Albumin: 3.9 g/dL (ref 3.5–5.2)
Alkaline Phosphatase: 82 U/L (ref 39–117)
BUN: 9 mg/dL (ref 6–23)
CO2: 26 meq/L (ref 19–32)
CREATININE: 0.96 mg/dL (ref 0.50–1.10)
Calcium: 8.9 mg/dL (ref 8.4–10.5)
Chloride: 105 mEq/L (ref 96–112)
GFR, Est African American: 70 mL/min
GFR, Est Non African American: 61 mL/min
GLUCOSE: 103 mg/dL — AB (ref 70–99)
POTASSIUM: 3.7 meq/L (ref 3.5–5.3)
Sodium: 142 mEq/L (ref 135–145)
TOTAL PROTEIN: 6.8 g/dL (ref 6.0–8.3)
Total Bilirubin: 0.6 mg/dL (ref 0.2–1.2)

## 2014-09-08 ENCOUNTER — Telehealth: Payer: Self-pay | Admitting: Family Medicine

## 2014-09-08 MED ORDER — VALSARTAN 320 MG PO TABS: 320.0000 mg | ORAL_TABLET | Freq: Every day | ORAL | Status: DC

## 2014-09-08 NOTE — Telephone Encounter (Signed)
D/c Bystolic, Chlorthalidone, Losartan, start and send in Valsatan 320 mg daily, disp # 30 with 1 refill and bp check in 2 weeks or so. Help can not find documentation

## 2014-09-08 NOTE — Telephone Encounter (Signed)
Patient informed of PCP instructions regarding d/c BP meds. To stop and to start the new on sent to pharmacy.  Patient has scheduled nurse visit for 09/18/14 at 9:45AM.

## 2014-09-08 NOTE — Telephone Encounter (Signed)
The patient stated she was seen by a nurse on 09/04/14 to check BP.  Was told to start a new blood pressure medication ,but was never sent in.  I am unable to see any documentation, please advise.

## 2014-09-08 NOTE — Telephone Encounter (Signed)
Updated medication list.  Sent in new prescription to local pharmacy. Called the patient left message on both cell/home number to call back.

## 2014-09-15 ENCOUNTER — Other Ambulatory Visit: Payer: Self-pay | Admitting: Family Medicine

## 2014-09-16 ENCOUNTER — Encounter: Payer: Self-pay | Admitting: Gastroenterology

## 2014-09-18 ENCOUNTER — Ambulatory Visit (INDEPENDENT_AMBULATORY_CARE_PROVIDER_SITE_OTHER): Payer: Medicare Other | Admitting: Family Medicine

## 2014-09-18 VITALS — BP 135/48 | HR 70

## 2014-09-18 DIAGNOSIS — I10 Essential (primary) hypertension: Secondary | ICD-10-CM

## 2014-09-18 DIAGNOSIS — Z0130 Encounter for examination of blood pressure without abnormal findings: Secondary | ICD-10-CM

## 2014-09-18 NOTE — Progress Notes (Signed)
Pre visit review using our clinic review tool, if applicable. No additional management support is needed unless otherwise documented below in the visit note.  Patient presents for a blood pressure check. Readings were B/P176/68 P74 and B/P135/48 P70.

## 2014-09-20 ENCOUNTER — Other Ambulatory Visit: Payer: Self-pay | Admitting: Family Medicine

## 2014-09-20 NOTE — Progress Notes (Addendum)
Patient reported at visit she was not taking Metoprolol and Lisinopril. These are discontinued. She will continue Diovan and Norvasc for now and return for routine visit unless she has concerning symptoms

## 2014-10-22 ENCOUNTER — Other Ambulatory Visit: Payer: Self-pay | Admitting: Family Medicine

## 2014-10-25 ENCOUNTER — Other Ambulatory Visit: Payer: Self-pay | Admitting: Family Medicine

## 2014-10-30 ENCOUNTER — Ambulatory Visit: Payer: Medicare Other | Admitting: Family Medicine

## 2014-11-20 ENCOUNTER — Ambulatory Visit: Payer: Medicare Other

## 2014-11-20 VITALS — BP 175/78 | HR 65

## 2014-11-20 DIAGNOSIS — Z0130 Encounter for examination of blood pressure without abnormal findings: Secondary | ICD-10-CM

## 2014-11-20 MED ORDER — AMLODIPINE BESYLATE 10 MG PO TABS: 10.0000 mg | ORAL_TABLET | Freq: Every day | ORAL | Status: DC

## 2014-11-20 NOTE — Patient Instructions (Signed)
Per Dr. Larose Kells increase Amlodipine to 10mg . Check BP 2 times weekly and record. Schedule follow up in 4-5 weeks with Dr. Charlett Blake.

## 2014-12-12 ENCOUNTER — Other Ambulatory Visit: Payer: Self-pay | Admitting: Family Medicine

## 2014-12-21 ENCOUNTER — Other Ambulatory Visit: Payer: Self-pay | Admitting: Family Medicine

## 2014-12-29 ENCOUNTER — Telehealth: Payer: Self-pay

## 2014-12-29 NOTE — Telephone Encounter (Signed)
Pt lvm for call back at 3:36.   Returned pt's call at 4:11. Informed pt of what the call was for , she says that she would like to keep a her appointments as is. Pt declined on scheduling a wellness visit.

## 2014-12-29 NOTE — Telephone Encounter (Signed)
Called to schedule Medicare Wellness Visit with Health Coach.  Left a message for call back.  

## 2014-12-29 NOTE — Telephone Encounter (Signed)
Noted  

## 2015-01-08 ENCOUNTER — Encounter: Payer: Self-pay | Admitting: Family Medicine

## 2015-01-08 ENCOUNTER — Ambulatory Visit (INDEPENDENT_AMBULATORY_CARE_PROVIDER_SITE_OTHER): Payer: Medicare Other | Admitting: Family Medicine

## 2015-01-08 VITALS — BP 165/72 | HR 71 | Temp 98.0°F | Ht 65.0 in | Wt 180.0 lb

## 2015-01-08 DIAGNOSIS — R5383 Other fatigue: Secondary | ICD-10-CM

## 2015-01-08 DIAGNOSIS — E663 Overweight: Secondary | ICD-10-CM

## 2015-01-08 DIAGNOSIS — I10 Essential (primary) hypertension: Secondary | ICD-10-CM | POA: Diagnosis not present

## 2015-01-08 DIAGNOSIS — E782 Mixed hyperlipidemia: Secondary | ICD-10-CM

## 2015-01-08 DIAGNOSIS — K219 Gastro-esophageal reflux disease without esophagitis: Secondary | ICD-10-CM | POA: Diagnosis not present

## 2015-01-08 MED ORDER — METOPROLOL SUCCINATE ER 50 MG PO TB24: ORAL_TABLET | ORAL | Status: DC

## 2015-01-08 MED ORDER — ATORVASTATIN CALCIUM 10 MG PO TABS: 5.0000 mg | ORAL_TABLET | Freq: Every day | ORAL | Status: DC

## 2015-01-08 NOTE — Patient Instructions (Signed)
Hypertension Hypertension, commonly called high blood pressure, is when the force of blood pumping through your arteries is too strong. Your arteries are the blood vessels that carry blood from your heart throughout your body. A blood pressure reading consists of a higher number over a lower number, such as 110/72. The higher number (systolic) is the pressure inside your arteries when your heart pumps. The lower number (diastolic) is the pressure inside your arteries when your heart relaxes. Ideally you want your blood pressure below 120/80. Hypertension forces your heart to work harder to pump blood. Your arteries may become narrow or stiff. Having untreated or uncontrolled hypertension can cause heart attack, stroke, kidney disease, and other problems. RISK FACTORS Some risk factors for high blood pressure are controllable. Others are not.  Risk factors you cannot control include:   Race. You may be at higher risk if you are African American.  Age. Risk increases with age.  Gender. Men are at higher risk than women before age 45 years. After age 65, women are at higher risk than men. Risk factors you can control include:  Not getting enough exercise or physical activity.  Being overweight.  Getting too much fat, sugar, calories, or salt in your diet.  Drinking too much alcohol. SIGNS AND SYMPTOMS Hypertension does not usually cause signs or symptoms. Extremely high blood pressure (hypertensive crisis) may cause headache, anxiety, shortness of breath, and nosebleed. DIAGNOSIS To check if you have hypertension, your health care provider will measure your blood pressure while you are seated, with your arm held at the level of your heart. It should be measured at least twice using the same arm. Certain conditions can cause a difference in blood pressure between your right and left arms. A blood pressure reading that is higher than normal on one occasion does not mean that you need treatment. If  it is not clear whether you have high blood pressure, you may be asked to return on a different day to have your blood pressure checked again. Or, you may be asked to monitor your blood pressure at home for 1 or more weeks. TREATMENT Treating high blood pressure includes making lifestyle changes and possibly taking medicine. Living a healthy lifestyle can help lower high blood pressure. You may need to change some of your habits. Lifestyle changes may include:  Following the DASH diet. This diet is high in fruits, vegetables, and whole grains. It is low in salt, red meat, and added sugars.  Keep your sodium intake below 2,300 mg per day.  Getting at least 30-45 minutes of aerobic exercise at least 4 times per week.  Losing weight if necessary.  Not smoking.  Limiting alcoholic beverages.  Learning ways to reduce stress. Your health care provider may prescribe medicine if lifestyle changes are not enough to get your blood pressure under control, and if one of the following is true:  You are 18-59 years of age and your systolic blood pressure is above 140.  You are 60 years of age or older, and your systolic blood pressure is above 150.  Your diastolic blood pressure is above 90.  You have diabetes, and your systolic blood pressure is over 140 or your diastolic blood pressure is over 90.  You have kidney disease and your blood pressure is above 140/90.  You have heart disease and your blood pressure is above 140/90. Your personal target blood pressure may vary depending on your medical conditions, your age, and other factors. HOME CARE INSTRUCTIONS    Have your blood pressure rechecked as directed by your health care provider.   Take medicines only as directed by your health care provider. Follow the directions carefully. Blood pressure medicines must be taken as prescribed. The medicine does not work as well when you skip doses. Skipping doses also puts you at risk for  problems.  Do not smoke.   Monitor your blood pressure at home as directed by your health care provider. SEEK MEDICAL CARE IF:   You think you are having a reaction to medicines taken.  You have recurrent headaches or feel dizzy.  You have swelling in your ankles.  You have trouble with your vision. SEEK IMMEDIATE MEDICAL CARE IF:  You develop a severe headache or confusion.  You have unusual weakness, numbness, or feel faint.  You have severe chest or abdominal pain.  You vomit repeatedly.  You have trouble breathing. MAKE SURE YOU:   Understand these instructions.  Will watch your condition.  Will get help right away if you are not doing well or get worse.   This information is not intended to replace advice given to you by your health care provider. Make sure you discuss any questions you have with your health care provider.   Document Released: 02/20/2005 Document Revised: 07/07/2014 Document Reviewed: 12/13/2012 Elsevier Interactive Patient Education 2016 Elsevier Inc.  

## 2015-01-14 ENCOUNTER — Ambulatory Visit: Payer: Medicare Other | Admitting: Family Medicine

## 2015-01-17 NOTE — Progress Notes (Signed)
Subjective:    Patient ID: Erika [name], female    DOB: [birth year], 69 y.o.   MRN: [id number]  Chief Complaint  Patient presents with  . Follow-up    HPI Patient is in today for follow-up. Blood pressure continues to run high. She has no new symptoms but she persists in having fatigue, dry eyes and dry mouth. No recent acute illness. No new concerns. Denies CP/palp/SOB/HA/congestion/fevers/GI or GU c/o. Taking meds as prescribed  Past Medical History  Diagnosis Date  . Diverticulosis of colon     colonoscopy 04/14/1999  . Obesity   . Chicken pox as a child  . Measles as a child  . Mumps as a child  . GERD (gastroesophageal reflux disease)   . Hyperlipidemia   . Hypertension   . Osteopenia   . History of fibrocystic disease of breast   . Bronchitis 04/02/2011  . Arthritis 04/02/2011  . IBS (irritable bowel syndrome) 04/02/2011  . Overactive bladder 04/02/2011  . Left shoulder strain 06/14/2011  . Dry mouth 03/13/2012  . Preventative health care 03/13/2012  . Allergic state 06/24/2012  . Hypokalemia 06/24/2012  . Contusion of leg, left 08/08/2012  . Anterior cervical lymphadenopathy 02/08/2013  . Shoulder pain, bilateral 06/14/2011  . Overweight   . Hyperlipidemia, mixed 02/20/2007    Followed as Primary Care Patient/ [facility]/ [facility]  Goal LDL < 130 HBP/Pos fm hx      . Hyponatremia 08/02/2014  . Fatigue 08/02/2014    Past Surgical History  Procedure Laterality Date  . Breast reduction surgery  1986  . Bunionectomy Bilateral 1980  . Mouth surgery  01-10-13    negative  . Hammer toe surgery Right 12-03-13    foot  . Tonsillectomy      Family History  Problem Relation Age of Onset  . Stroke Mother 76  . Hyperlipidemia Mother   . Hypertension Mother   . Heart attack Father 51  . Hypertension Father   . Heart disease Father   . Hypertension Brother   . Aneurysm Brother     aortic  . Hyperlipidemia Brother   . Heart disease Brother     aortic aneurysm rupture  .  Multiple sclerosis Daughter   . Alcohol abuse Son   . Schizophrenia Son   . Bipolar disorder Son   . Mental illness Son     schizophrenic, bipolar    Social History   Social History  . Marital Status: Married    Spouse Name: N/A  . Number of Children: 4  . Years of Education: N/A   Occupational History  . dental hygenist    Social History Main Topics  . Smoking status: Never Smoker   . Smokeless tobacco: Never Used  . Alcohol Use: No  . Drug Use: No  . Sexual Activity: Yes   Other Topics Concern  . Not on file   Social History Narrative   4 children 2 are adopted    Outpatient Prescriptions Prior to Visit  Medication Sig Dispense Refill  . amLODipine (NORVASC) 10 MG tablet Take 1 tablet (10 mg total) by mouth daily. 30 tablet 0  . aspirin 81 MG tablet Take 81 mg by mouth daily.      . calcium carbonate (TUMS) 500 MG chewable tablet as needed.      . cetirizine (ZYRTEC) 10 MG tablet Take 10 mg by mouth at bedtime as needed.      . cycloSPORINE (RESTASIS) 0.05 % ophthalmic emulsion Place  1 drop into both eyes 2 (two) times daily.    . Multiple Minerals-Vitamins (CITRACAL PLUS PO) Take 1 tablet by mouth daily.      Marland Kitchen omeprazole (PRILOSEC) 20 MG capsule Take 1 capsule (20 mg total) by mouth daily. 90 capsule 2  . ranitidine (ZANTAC) 300 MG tablet Take 1 tablet (300 mg total) by mouth at bedtime. 30 tablet 3  . trospium (SANCTURA) 20 MG tablet Take 20 mg by mouth daily.      . valsartan (DIOVAN) 320 MG tablet TAKE 1 TABLET BY MOUTH EVERY DAY 30 tablet 1  . amLODipine (NORVASC) 5 MG tablet TAKE 1 TABLET (5 MG TOTAL) BY MOUTH DAILY. 30 tablet 8  . diphenhydramine-acetaminophen (TYLENOL PM) 25-500 MG TABS Take 1 tablet by mouth at bedtime as needed.      Marland Kitchen OVER THE COUNTER MEDICATION MegaRed- 1 daily    . potassium chloride SA (K-DUR,KLOR-CON) 20 MEQ tablet Take 1 tablet (20 mEq total) by mouth daily. (Patient not taking: Reported on 08/26/2014) 7 tablet 0  . atorvastatin  (LIPITOR) 10 MG tablet TAKE 1/2TABLET BY MOUTH AT BEDTIME. (Patient not taking: Reported on 01/08/2015) 15 tablet 3  . metoprolol succinate (TOPROL-XL) 50 MG 24 hr tablet TAKE 1 TABLET BY MOUTH EVERY DAY WITH OR IMMEDIATELY FOLLOWING A MEAL 30 tablet 2   No facility-administered medications prior to visit.    No Known Allergies  Review of Systems  Constitutional: Positive for malaise/fatigue. Negative for fever.  HENT: Negative for congestion.   Eyes: Negative for discharge.  Respiratory: Negative for shortness of breath.   Cardiovascular: Negative for chest pain, palpitations and leg swelling.  Gastrointestinal: Negative for nausea and abdominal pain.  Genitourinary: Negative for dysuria.  Musculoskeletal: Negative for falls.  Skin: Negative for rash.  Neurological: Negative for loss of consciousness and headaches.  Endo/Heme/Allergies: Negative for environmental allergies.  Psychiatric/Behavioral: Negative for depression. The patient is not nervous/anxious.        Objective:    Physical Exam  Constitutional: She is oriented to person, place, and time. She appears well-developed and well-nourished. No distress.  HENT:  Head: Normocephalic and atraumatic.  Nose: Nose normal.  Eyes: Right eye exhibits no discharge. Left eye exhibits no discharge.  Neck: Normal range of motion. Neck supple.  Cardiovascular: Normal rate and regular rhythm.   No murmur heard. Pulmonary/Chest: Effort normal and breath sounds normal.  Abdominal: Soft. Bowel sounds are normal. There is no tenderness.  Musculoskeletal: She exhibits no edema.  Neurological: She is alert and oriented to person, place, and time.  Skin: Skin is warm and dry.  Psychiatric: She has a normal mood and affect.  Nursing note and vitals reviewed.   BP 165/72 mmHg  Pulse 71  Temp(Src) 98 F (36.7 C) (Oral)  Ht 5\' 5"  (1.651 m)  Wt 180 lb (81.647 kg)  BMI 29.95 kg/m2  SpO2 96% Wt Readings from Last 3 Encounters:    01/08/15 180 lb (81.647 kg)  08/26/14 178 lb 9.6 oz (81.012 kg)  07/24/14 176 lb (79.833 kg)     Lab Results  Component Value Date   WBC 6.1 07/24/2014   HGB 14.2 07/24/2014   HCT 40.6 07/24/2014   PLT 309.0 07/24/2014   GLUCOSE 103* 09/04/2014   CHOL 234* 07/24/2014   TRIG 266.0* 07/24/2014   HDL 42.10 07/24/2014   LDLDIRECT 163.0 07/24/2014   LDLCALC 171* 01/12/2014   ALT 17 09/04/2014   AST 19 09/04/2014   NA 142 09/04/2014  K 3.7 09/04/2014   CL 105 09/04/2014   CREATININE 0.96 09/04/2014   BUN 9 09/04/2014   CO2 26 09/04/2014   TSH 0.79 07/24/2014    Lab Results  Component Value Date   TSH 0.79 07/24/2014   Lab Results  Component Value Date   WBC 6.1 07/24/2014   HGB 14.2 07/24/2014   HCT 40.6 07/24/2014   MCV 88.1 07/24/2014   PLT 309.0 07/24/2014   Lab Results  Component Value Date   NA 142 09/04/2014   K 3.7 09/04/2014   CO2 26 09/04/2014   GLUCOSE 103* 09/04/2014   BUN 9 09/04/2014   CREATININE 0.96 09/04/2014   BILITOT 0.6 09/04/2014   ALKPHOS 82 09/04/2014   AST 19 09/04/2014   ALT 17 09/04/2014   PROT 6.8 09/04/2014   ALBUMIN 3.9 09/04/2014   CALCIUM 8.9 09/04/2014   GFR 77.94 07/28/2014   Lab Results  Component Value Date   CHOL 234* 07/24/2014   Lab Results  Component Value Date   HDL 42.10 07/24/2014   Lab Results  Component Value Date   LDLCALC 171* 01/12/2014   Lab Results  Component Value Date   TRIG 266.0* 07/24/2014   Lab Results  Component Value Date   CHOLHDL 6 07/24/2014   No results found for: HGBA1C     Assessment & Plan:   Problem List Items Addressed This Visit    Hyperlipidemia, mixed    Tolerating statin, encouraged heart healthy diet, avoid trans fats, minimize simple carbs and saturated fats. Increase exercise as tolerated      Relevant Medications   metoprolol succinate (TOPROL-XL) 50 MG 24 hr tablet   atorvastatin (LIPITOR) 10 MG tablet   GERD - Primary    Avoid offending foods, start  probiotics. Do not eat large meals in late evening and consider raising head of bed.       Hypertension    Increase Metoprolol. Continue other meds as prescribed.      Relevant Medications   metoprolol succinate (TOPROL-XL) 50 MG 24 hr tablet   atorvastatin (LIPITOR) 10 MG tablet   Overweight    Encouraged DASH diet, decrease po intake and increase exercise as tolerated. Needs 7-8 hours of sleep nightly. Avoid trans fats, eat small, frequent meals every 4-5 hours with lean proteins, complex carbs and healthy fats. Minimize simple carbs, GMO foods.      Fatigue    Multifactorial cares for numerous grand kids. Stays very busy.          I have changed Ms. Weitzel's atorvastatin. I am also having her maintain her aspirin, Multiple Minerals-Vitamins (CITRACAL PLUS PO), trospium, calcium carbonate, diphenhydramine-acetaminophen, cetirizine, cycloSPORINE, OVER THE COUNTER MEDICATION, ranitidine, omeprazole, potassium chloride SA, amLODipine, valsartan, and metoprolol succinate.  Meds ordered this encounter  Medications  . metoprolol succinate (TOPROL-XL) 50 MG 24 hr tablet    Sig: TAKE 1 TABLET BY MOUTH EVERY DAY WITH OR IMMEDIATELY FOLLOWING A MEAL    Dispense:  30 tablet    Refill:  2  . atorvastatin (LIPITOR) 10 MG tablet    Sig: Take 0.5 tablets (5 mg total) by mouth daily at 6 PM.    Dispense:  15 tablet    Refill:  3     Penni Homans, MD

## 2015-01-17 NOTE — Assessment & Plan Note (Signed)
Avoid offending foods, start probiotics. Do not eat large meals in late evening and consider raising head of bed.  

## 2015-01-17 NOTE — Assessment & Plan Note (Signed)
Multifactorial cares for numerous grand kids. Stays very busy.

## 2015-01-17 NOTE — Assessment & Plan Note (Signed)
Increase Metoprolol. Continue other meds as prescribed.

## 2015-01-17 NOTE — Assessment & Plan Note (Signed)
Tolerating statin, encouraged heart healthy diet, avoid trans fats, minimize simple carbs and saturated fats. Increase exercise as tolerated 

## 2015-01-17 NOTE — Assessment & Plan Note (Signed)
Encouraged DASH diet, decrease po intake and increase exercise as tolerated. Needs 7-8 hours of sleep nightly. Avoid trans fats, eat small, frequent meals every 4-5 hours with lean proteins, complex carbs and healthy fats. Minimize simple carbs, GMO foods. 

## 2015-01-18 ENCOUNTER — Ambulatory Visit: Payer: Medicare Other | Admitting: Family Medicine

## 2015-01-23 ENCOUNTER — Other Ambulatory Visit: Payer: Self-pay | Admitting: Family Medicine

## 2015-02-09 ENCOUNTER — Ambulatory Visit (INDEPENDENT_AMBULATORY_CARE_PROVIDER_SITE_OTHER): Payer: Medicare Other | Admitting: Family Medicine

## 2015-02-09 VITALS — BP 189/64 | HR 61

## 2015-02-09 DIAGNOSIS — I10 Essential (primary) hypertension: Secondary | ICD-10-CM

## 2015-02-09 MED ORDER — TRIAMTERENE-HCTZ 37.5-25 MG PO TABS: 1.0000 | ORAL_TABLET | Freq: Every day | ORAL | Status: DC

## 2015-02-09 NOTE — Progress Notes (Signed)
RN blood check note reviewed. Agree with documention and plan. 

## 2015-02-09 NOTE — Patient Instructions (Signed)
Per Dr. Charlett Blake, start Maxzide- Hydrochlorathiazide  37.5-25mg   1 tablet daily. Return in 1-2 weeks Recheck BP and CMP. Future lab order placed.

## 2015-02-09 NOTE — Progress Notes (Signed)
Pre visit review using our clinic review tool, if applicable. No additional management support is needed unless otherwise documented below in the visit note.  Patient in for BP check. BP elevated on last OV. Restarted Metoprolol 50 mg daily.

## 2015-02-13 ENCOUNTER — Other Ambulatory Visit: Payer: Self-pay | Admitting: Family Medicine

## 2015-02-14 NOTE — Progress Notes (Signed)
RN blood check note reviewed. Agree with documention and plan. 

## 2015-02-16 ENCOUNTER — Ambulatory Visit (INDEPENDENT_AMBULATORY_CARE_PROVIDER_SITE_OTHER): Payer: PPO | Admitting: Family Medicine

## 2015-02-16 ENCOUNTER — Other Ambulatory Visit (INDEPENDENT_AMBULATORY_CARE_PROVIDER_SITE_OTHER): Payer: Medicare Other

## 2015-02-16 VITALS — BP 130/72 | HR 64

## 2015-02-16 DIAGNOSIS — I10 Essential (primary) hypertension: Secondary | ICD-10-CM

## 2015-02-16 LAB — COMPREHENSIVE METABOLIC PANEL
ALT: 17 U/L (ref 0–35)
AST: 17 U/L (ref 0–37)
Albumin: 3.9 g/dL (ref 3.5–5.2)
Alkaline Phosphatase: 111 U/L (ref 39–117)
BILIRUBIN TOTAL: 0.7 mg/dL (ref 0.2–1.2)
BUN: 19 mg/dL (ref 6–23)
CALCIUM: 9.3 mg/dL (ref 8.4–10.5)
CO2: 30 meq/L (ref 19–32)
CREATININE: 1.59 mg/dL — AB (ref 0.40–1.20)
Chloride: 99 mEq/L (ref 96–112)
GFR: 34.21 mL/min — ABNORMAL LOW (ref >60.00)
Glucose, Bld: 100 mg/dL — ABNORMAL HIGH (ref 70–99)
Potassium: 3.9 mEq/L (ref 3.5–5.1)
Sodium: 137 mEq/L (ref 135–145)
TOTAL PROTEIN: 7 g/dL (ref 6.0–8.3)

## 2015-02-16 NOTE — Progress Notes (Signed)
RN blood check note reviewed. Agree with documention and plan. 

## 2015-02-17 ENCOUNTER — Other Ambulatory Visit: Payer: Self-pay | Admitting: Family Medicine

## 2015-02-17 DIAGNOSIS — I10 Essential (primary) hypertension: Secondary | ICD-10-CM

## 2015-02-19 ENCOUNTER — Other Ambulatory Visit: Payer: Self-pay | Admitting: Family Medicine

## 2015-02-21 NOTE — Progress Notes (Signed)
RN blood check note reviewed. Agree with documention and plan. 

## 2015-02-22 ENCOUNTER — Encounter: Payer: Self-pay | Admitting: Family Medicine

## 2015-03-03 ENCOUNTER — Other Ambulatory Visit: Payer: Self-pay | Admitting: Family Medicine

## 2015-03-17 ENCOUNTER — Other Ambulatory Visit: Payer: Self-pay | Admitting: Family Medicine

## 2015-03-30 ENCOUNTER — Other Ambulatory Visit: Payer: Self-pay | Admitting: Family Medicine

## 2015-04-19 ENCOUNTER — Other Ambulatory Visit: Payer: Self-pay | Admitting: Family Medicine

## 2015-04-22 ENCOUNTER — Ambulatory Visit (INDEPENDENT_AMBULATORY_CARE_PROVIDER_SITE_OTHER): Payer: PPO | Admitting: Family Medicine

## 2015-04-22 ENCOUNTER — Encounter: Payer: Self-pay | Admitting: Family Medicine

## 2015-04-22 VITALS — BP 140/88 | HR 66 | Temp 98.7°F | Ht 65.0 in | Wt 180.5 lb

## 2015-04-22 DIAGNOSIS — M1990 Unspecified osteoarthritis, unspecified site: Secondary | ICD-10-CM

## 2015-04-22 DIAGNOSIS — E871 Hypo-osmolality and hyponatremia: Secondary | ICD-10-CM

## 2015-04-22 DIAGNOSIS — E782 Mixed hyperlipidemia: Secondary | ICD-10-CM

## 2015-04-22 DIAGNOSIS — I10 Essential (primary) hypertension: Secondary | ICD-10-CM

## 2015-04-22 DIAGNOSIS — M8580 Other specified disorders of bone density and structure, unspecified site: Secondary | ICD-10-CM | POA: Diagnosis not present

## 2015-04-22 DIAGNOSIS — Z1159 Encounter for screening for other viral diseases: Secondary | ICD-10-CM

## 2015-04-22 DIAGNOSIS — K219 Gastro-esophageal reflux disease without esophagitis: Secondary | ICD-10-CM

## 2015-04-22 MED ORDER — METOPROLOL SUCCINATE ER 50 MG PO TB24: ORAL_TABLET | ORAL | Status: DC

## 2015-04-22 NOTE — Patient Instructions (Addendum)
Salonpas patches and Mylanta   Gastroesophageal Reflux Disease, Adult Normally, food travels down the esophagus and stays in the stomach to be digested. However, when a person has gastroesophageal reflux disease (GERD), food and stomach acid move back up into the esophagus. When this happens, the esophagus becomes sore and inflamed. Over time, GERD can create small holes (ulcers) in the lining of the esophagus.  CAUSES This condition is caused by a problem with the muscle between the esophagus and the stomach (lower esophageal sphincter, or LES). Normally, the LES muscle closes after food passes through the esophagus to the stomach. When the LES is weakened or abnormal, it does not close properly, and that allows food and stomach acid to go back up into the esophagus. The LES can be weakened by certain dietary substances, medicines, and medical conditions, including:  Tobacco use.  Pregnancy.  Having a hiatal hernia.  Heavy alcohol use.  Certain foods and beverages, such as coffee, chocolate, onions, and peppermint. RISK FACTORS This condition is more likely to develop in:  People who have an increased body weight.  People who have connective tissue disorders.  People who use NSAID medicines. SYMPTOMS Symptoms of this condition include:  Heartburn.  Difficult or painful swallowing.  The feeling of having a lump in the throat.  Abitter taste in the mouth.  Bad breath.  Having a large amount of saliva.  Having an upset or bloated stomach.  Belching.  Chest pain.  Shortness of breath or wheezing.  Ongoing (chronic) cough or a night-time cough.  Wearing away of tooth enamel.  Weight loss. Different conditions can cause chest pain. Make sure to see your health care provider if you experience chest pain. DIAGNOSIS Your health care provider will take a medical history and perform a physical exam. To determine if you have mild or severe GERD, your health care provider  may also monitor how you respond to treatment. You may also have other tests, including:  An endoscopy toexamine your stomach and esophagus with a small camera.  A test thatmeasures the acidity level in your esophagus.  A test thatmeasures how much pressure is on your esophagus.  A barium swallow or modified barium swallow to show the shape, size, and functioning of your esophagus. TREATMENT The goal of treatment is to help relieve your symptoms and to prevent complications. Treatment for this condition may vary depending on how severe your symptoms are. Your health care provider may recommend:  Changes to your diet.  Medicine.  Surgery. HOME CARE INSTRUCTIONS Diet  Follow a diet as recommended by your health care provider. This may involve avoiding foods and drinks such as:  Coffee and tea (with or without caffeine).  Drinks that containalcohol.  Energy drinks and sports drinks.  Carbonated drinks or sodas.  Chocolate and cocoa.  Peppermint and mint flavorings.  Garlic and onions.  Horseradish.  Spicy and acidic foods, including peppers, chili powder, curry powder, vinegar, hot sauces, and barbecue sauce.  Citrus fruit juices and citrus fruits, such as oranges, lemons, and limes.  Tomato-based foods, such as red sauce, chili, salsa, and pizza with red sauce.  Fried and fatty foods, such as donuts, french fries, potato chips, and high-fat dressings.  High-fat meats, such as hot dogs and fatty cuts of red and white meats, such as rib eye steak, sausage, ham, and bacon.  High-fat dairy items, such as whole milk, butter, and cream cheese.  Eat small, frequent meals instead of large meals.  Avoid drinking  large amounts of liquid with your meals.  Avoid eating meals during the 2-3 hours before bedtime.  Avoid lying down right after you eat.  Do not exercise right after you eat. General Instructions  Pay attention to any changes in your symptoms.  Take  over-the-counter and prescription medicines only as told by your health care provider. Do not take aspirin, ibuprofen, or other NSAIDs unless your health care provider told you to do so.  Do not use any tobacco products, including cigarettes, chewing tobacco, and e-cigarettes. If you need help quitting, ask your health care provider.  Wear loose-fitting clothing. Do not wear anything tight around your waist that causes pressure on your abdomen.  Raise (elevate) the head of your bed 6 inches (15cm).  Try to reduce your stress, such as with yoga or meditation. If you need help reducing stress, ask your health care provider.  If you are overweight, reduce your weight to an amount that is healthy for you. Ask your health care provider for guidance about a safe weight loss goal.  Keep all follow-up visits as told by your health care provider. This is important. SEEK MEDICAL CARE IF:  You have new symptoms.  You have unexplained weight loss.  You have difficulty swallowing, or it hurts to swallow.  You have wheezing or a persistent cough.  Your symptoms do not improve with treatment.  You have a hoarse voice. SEEK IMMEDIATE MEDICAL CARE IF:  You have pain in your arms, neck, jaw, teeth, or back.  You feel sweaty, dizzy, or light-headed.  You have chest pain or shortness of breath.  You vomit and your vomit looks like blood or coffee grounds.  You faint.  Your stool is bloody or black.  You cannot swallow, drink, or eat.   This information is not intended to replace advice given to you by your health care provider. Make sure you discuss any questions you have with your health care provider.   Document Released: 11/30/2004 Document Revised: 11/11/2014 Document Reviewed: 06/17/2014 Elsevier Interactive Patient Education [employer].

## 2015-04-22 NOTE — Progress Notes (Signed)
Subjective:    Patient ID: Erika [name], female    DOB: 01-20-1946, 70 y.o.   MRN: [id number]  Chief Complaint  Patient presents with  . Follow-up    HPI Patient is in today for follow up with numerous concerns. Has noted a flare in heartburn. Occasional chest discomfort and dyspepsia are noted. No change in diet or intake. No nausea, vomiting, diarrhea or constipation. Does struggle with significant fatigue due to her busy schedule. Denies any significant anhedonia or depression. No recent illness. Denies CP/palp/SOB/HA/congestion/fevers or GU c/o. Taking meds as prescribed    Past Medical History  Diagnosis Date  . Diverticulosis of colon     colonoscopy 04/14/1999  . Obesity   . Chicken pox as a child  . Measles as a child  . Mumps as a child  . GERD (gastroesophageal reflux disease)   . Hyperlipidemia   . Hypertension   . Osteopenia   . History of fibrocystic disease of breast   . Bronchitis 04/02/2011  . Arthritis 04/02/2011  . IBS (irritable bowel syndrome) 04/02/2011  . Overactive bladder 04/02/2011  . Left shoulder strain 06/14/2011  . Dry mouth 03/13/2012  . Preventative health care 03/13/2012  . Allergic state 06/24/2012  . Hypokalemia 06/24/2012  . Contusion of leg, left 08/08/2012  . Anterior cervical lymphadenopathy 02/08/2013  . Shoulder pain, bilateral 06/14/2011  . Overweight   . Hyperlipidemia, mixed 02/20/2007    Followed as Primary Care Patient/ [facility]/ [facility]  Goal LDL < 130 HBP/Pos fm hx      . Hyponatremia 08/02/2014  . Fatigue 08/02/2014    Past Surgical History  Procedure Laterality Date  . Breast reduction surgery  1986  . Bunionectomy Bilateral 1980  . Mouth surgery  01-10-13    negative  . Hammer toe surgery Right 12-03-13    foot  . Tonsillectomy      Family History  Problem Relation Age of Onset  . Stroke Mother 66  . Hyperlipidemia Mother   . Hypertension Mother   . Heart attack Father 78  . Hypertension Father   . Heart disease  Father   . Hypertension Brother   . Aneurysm Brother     aortic  . Hyperlipidemia Brother   . Heart disease Brother     aortic aneurysm rupture  . Multiple sclerosis Daughter   . Alcohol abuse Son   . Schizophrenia Son   . Bipolar disorder Son   . Mental illness Son     schizophrenic, bipolar    Social History   Social History  . Marital Status: Married    Spouse Name: N/A  . Number of Children: 4  . Years of Education: N/A   Occupational History  . dental hygenist    Social History Main Topics  . Smoking status: Never Smoker   . Smokeless tobacco: Never Used  . Alcohol Use: No  . Drug Use: No  . Sexual Activity: Yes   Other Topics Concern  . Not on file   Social History Narrative   4 children 2 are adopted    Outpatient Prescriptions Prior to Visit  Medication Sig Dispense Refill  . amLODipine (NORVASC) 10 MG tablet TAKE ONE TABLET BY MOUTH DAILY. 30 tablet 8  . aspirin 81 MG tablet Take 81 mg by mouth daily.      Marland Kitchen atorvastatin (LIPITOR) 10 MG tablet Take 0.5 tablets (5 mg total) by mouth daily at 6 PM. 15 tablet 3  . calcium carbonate (  TUMS) 500 MG chewable tablet as needed.      . cetirizine (ZYRTEC) 10 MG tablet Take 10 mg by mouth at bedtime as needed.      . cycloSPORINE (RESTASIS) 0.05 % ophthalmic emulsion Place 1 drop into both eyes 2 (two) times daily.    . diphenhydramine-acetaminophen (TYLENOL PM) 25-500 MG TABS Take 1 tablet by mouth at bedtime as needed.      . Multiple Minerals-Vitamins (CITRACAL PLUS PO) Take 1 tablet by mouth daily.      Marland Kitchen omeprazole (PRILOSEC) 20 MG capsule TAKE ONE CAPSULE BY MOUTH EVERY DAY 90 capsule 2  . OVER THE COUNTER MEDICATION MegaRed- 1 daily    . ranitidine (ZANTAC) 300 MG tablet TAKE 1 TABLET (300 MG TOTAL) BY MOUTH AT BEDTIME. 30 tablet 3  . triamterene-hydrochlorothiazide (MAXZIDE-25) 37.5-25 MG tablet TAKE 1 TABLET BY MOUTH DAILY. 30 tablet 3  . trospium (SANCTURA) 20 MG tablet Take 20 mg by mouth daily.      .  valsartan (DIOVAN) 320 MG tablet TAKE 1 TABLET BY MOUTH EVERY DAY 30 tablet 5  . metoprolol succinate (TOPROL-XL) 50 MG 24 hr tablet TAKE 1 TABLET BY MOUTH EVERY DAY WITH OR IMMEDIATELY FOLLOWING A MEAL 30 tablet 2   No facility-administered medications prior to visit.    No Known Allergies  Review of Systems  Constitutional: Positive for malaise/fatigue. Negative for fever.  HENT: Negative for congestion.   Eyes: Negative for discharge.  Respiratory: Negative for shortness of breath.   Cardiovascular: Negative for chest pain, palpitations and leg swelling.  Gastrointestinal: Positive for heartburn. Negative for nausea and abdominal pain.  Genitourinary: Negative for dysuria.  Musculoskeletal: Positive for joint pain. Negative for falls.  Skin: Negative for rash.  Neurological: Negative for loss of consciousness and headaches.  Endo/Heme/Allergies: Negative for environmental allergies.  Psychiatric/Behavioral: Negative for depression. The patient is not nervous/anxious.        Objective:    Physical Exam  Constitutional: She is oriented to person, place, and time. She appears well-developed and well-nourished. No distress.  HENT:  Head: Normocephalic and atraumatic.  Nose: Nose normal.  Eyes: Right eye exhibits no discharge. Left eye exhibits no discharge.  Neck: Normal range of motion. Neck supple.  Cardiovascular: Normal rate and regular rhythm.   No murmur heard. Pulmonary/Chest: Effort normal and breath sounds normal.  Abdominal: Soft. Bowel sounds are normal. There is no tenderness.  Musculoskeletal: She exhibits no edema.  Neurological: She is alert and oriented to person, place, and time.  Skin: Skin is warm and dry.  Psychiatric: She has a normal mood and affect.  Nursing note and vitals reviewed.   BP 140/88 mmHg  Pulse 66  Temp(Src) 98.7 F (37.1 C) (Oral)  Ht 5\' 5"  (1.651 m)  Wt 180 lb 8 oz (81.874 kg)  BMI 30.04 kg/m2  SpO2 98% Wt Readings from Last 3  Encounters:  04/22/15 180 lb 8 oz (81.874 kg)  01/08/15 180 lb (81.647 kg)  08/26/14 178 lb 9.6 oz (81.012 kg)     Lab Results  Component Value Date   WBC 6.1 07/24/2014   HGB 14.2 07/24/2014   HCT 40.6 07/24/2014   PLT 309.0 07/24/2014   GLUCOSE 100* 02/16/2015   CHOL 234* 07/24/2014   TRIG 266.0* 07/24/2014   HDL 42.10 07/24/2014   LDLDIRECT 163.0 07/24/2014   LDLCALC 171* 01/12/2014   ALT 17 02/16/2015   AST 17 02/16/2015   NA 137 02/16/2015   K 3.9 02/16/2015  CL 99 02/16/2015   CREATININE 1.59* 02/16/2015   BUN 19 02/16/2015   CO2 30 02/16/2015   TSH 0.79 07/24/2014    Lab Results  Component Value Date   TSH 0.79 07/24/2014   Lab Results  Component Value Date   WBC 6.1 07/24/2014   HGB 14.2 07/24/2014   HCT 40.6 07/24/2014   MCV 88.1 07/24/2014   PLT 309.0 07/24/2014   Lab Results  Component Value Date   NA 137 02/16/2015   K 3.9 02/16/2015   CO2 30 02/16/2015   GLUCOSE 100* 02/16/2015   BUN 19 02/16/2015   CREATININE 1.59* 02/16/2015   BILITOT 0.7 02/16/2015   ALKPHOS 111 02/16/2015   AST 17 02/16/2015   ALT 17 02/16/2015   PROT 7.0 02/16/2015   ALBUMIN 3.9 02/16/2015   CALCIUM 9.3 02/16/2015   GFR 34.21* 02/16/2015   Lab Results  Component Value Date   CHOL 234* 07/24/2014   Lab Results  Component Value Date   HDL 42.10 07/24/2014   Lab Results  Component Value Date   LDLCALC 171* 01/12/2014   Lab Results  Component Value Date   TRIG 266.0* 07/24/2014   Lab Results  Component Value Date   CHOLHDL 6 07/24/2014   No results found for: HGBA1C     Assessment & Plan:   Problem List Items Addressed This Visit    Arthritis    Encouraged moist heat and gentle stretching as tolerated. May try NSAIDs and prescription meds as directed and report if symptoms worsen or seek immediate care. Topical treatments prn      GERD    Recent flare, no change in diet. Avoid offending foods, start probiotics. Do not eat large meals in late  evening and consider raising head of bed. Continue current meds and add Mylanta prn      Hyperlipidemia, mixed    Tolerating statin, encouraged heart healthy diet, avoid trans fats, minimize simple carbs and saturated fats. Increase exercise as tolerated      Relevant Medications   metoprolol succinate (TOPROL-XL) 50 MG 24 hr tablet   Other Relevant Orders   Magnesium   CBC   Comprehensive metabolic panel   Lipid panel   TSH   Hypertension - Primary    Well controlled, no changes to meds. Encouraged heart healthy diet such as the DASH diet and exercise as tolerated.       Relevant Medications   metoprolol succinate (TOPROL-XL) 50 MG 24 hr tablet   Other Relevant Orders   Magnesium   CBC   Comprehensive metabolic panel   Lipid panel   TSH   Hyponatremia   Relevant Orders   Magnesium   CBC   Comprehensive metabolic panel   Lipid panel   TSH   Vitamin D (25 hydroxy)   Osteopenia   Relevant Orders   Magnesium   CBC   Comprehensive metabolic panel   Lipid panel   TSH   Vitamin D (25 hydroxy)    Other Visit Diagnoses    Need for hepatitis C screening test        Relevant Orders    Hepatitis C antibody       I am having Ms. Maxwell Caul maintain her aspirin, Multiple Minerals-Vitamins (CITRACAL PLUS PO), trospium, calcium carbonate, diphenhydramine-acetaminophen, cetirizine, cycloSPORINE, OVER THE COUNTER MEDICATION, atorvastatin, omeprazole, amLODipine, triamterene-hydrochlorothiazide, ranitidine, valsartan, and metoprolol succinate.  Meds ordered this encounter  Medications  . metoprolol succinate (TOPROL-XL) 50 MG 24 hr tablet    Sig:  TAKE 1 TABLET BY MOUTH EVERY DAY WITH OR IMMEDIATELY FOLLOWING A MEAL    Dispense:  30 tablet    Refill:  6     Penni Homans, MD

## 2015-04-22 NOTE — Progress Notes (Signed)
Pre visit review using our clinic review tool, if applicable. No additional management support is needed unless otherwise documented below in the visit note. 

## 2015-05-02 ENCOUNTER — Encounter: Payer: Self-pay | Admitting: Family Medicine

## 2015-05-02 NOTE — Assessment & Plan Note (Signed)
Recent flare, no change in diet. Avoid offending foods, start probiotics. Do not eat large meals in late evening and consider raising head of bed. Continue current meds and add Mylanta prn

## 2015-05-02 NOTE — Assessment & Plan Note (Signed)
Well controlled, no changes to meds. Encouraged heart healthy diet such as the DASH diet and exercise as tolerated.  °

## 2015-05-02 NOTE — Assessment & Plan Note (Signed)
Encouraged moist heat and gentle stretching as tolerated. May try NSAIDs and prescription meds as directed and report if symptoms worsen or seek immediate care. Topical treatments prn

## 2015-05-02 NOTE — Assessment & Plan Note (Signed)
Tolerating statin, encouraged heart healthy diet, avoid trans fats, minimize simple carbs and saturated fats. Increase exercise as tolerated 

## 2015-05-07 ENCOUNTER — Other Ambulatory Visit (INDEPENDENT_AMBULATORY_CARE_PROVIDER_SITE_OTHER): Payer: PPO

## 2015-05-07 DIAGNOSIS — I10 Essential (primary) hypertension: Secondary | ICD-10-CM | POA: Diagnosis not present

## 2015-05-07 DIAGNOSIS — M8580 Other specified disorders of bone density and structure, unspecified site: Secondary | ICD-10-CM | POA: Diagnosis not present

## 2015-05-07 DIAGNOSIS — Z1159 Encounter for screening for other viral diseases: Secondary | ICD-10-CM

## 2015-05-07 DIAGNOSIS — E782 Mixed hyperlipidemia: Secondary | ICD-10-CM

## 2015-05-07 DIAGNOSIS — E871 Hypo-osmolality and hyponatremia: Secondary | ICD-10-CM | POA: Diagnosis not present

## 2015-05-07 LAB — LIPID PANEL
CHOL/HDL RATIO: 4
Cholesterol: 182 mg/dL (ref 0–200)
HDL: 44.1 mg/dL (ref >39.00)
LDL CALC: 108 mg/dL — AB (ref 0–99)
NonHDL: 138.02
TRIGLYCERIDES: 149 mg/dL (ref 0.0–149.0)
VLDL: 29.8 mg/dL (ref 0.0–40.0)

## 2015-05-07 LAB — CBC
HEMATOCRIT: 34.4 % — AB (ref 36.0–46.0)
Hemoglobin: 11.8 g/dL — ABNORMAL LOW (ref 12.0–15.0)
MCHC: 34.2 g/dL (ref 30.0–36.0)
MCV: 86.3 fl (ref 78.0–100.0)
Platelets: 303 10*3/uL (ref 150.0–400.0)
RBC: 3.99 Mil/uL (ref 3.87–5.11)
RDW: 12.9 % (ref 11.5–15.5)
WBC: 4.7 10*3/uL (ref 4.0–10.5)

## 2015-05-07 LAB — COMPREHENSIVE METABOLIC PANEL
ALT: 17 U/L (ref 0–35)
AST: 19 U/L (ref 0–37)
Albumin: 4.2 g/dL (ref 3.5–5.2)
Alkaline Phosphatase: 78 U/L (ref 39–117)
BUN: 17 mg/dL (ref 6–23)
CHLORIDE: 97 meq/L (ref 96–112)
CO2: 30 meq/L (ref 19–32)
CREATININE: 1.25 mg/dL — AB (ref 0.40–1.20)
Calcium: 9.3 mg/dL (ref 8.4–10.5)
GFR: 45.12 mL/min — ABNORMAL LOW (ref >60.00)
Glucose, Bld: 89 mg/dL (ref 70–99)
POTASSIUM: 3.7 meq/L (ref 3.5–5.1)
SODIUM: 134 meq/L — AB (ref 135–145)
Total Bilirubin: 0.6 mg/dL (ref 0.2–1.2)
Total Protein: 7.3 g/dL (ref 6.0–8.3)

## 2015-05-07 LAB — TSH: TSH: 0.81 u[IU]/mL (ref 0.35–4.50)

## 2015-05-07 LAB — MAGNESIUM: Magnesium: 1.9 mg/dL (ref 1.5–2.5)

## 2015-05-07 LAB — VITAMIN D 25 HYDROXY (VIT D DEFICIENCY, FRACTURES): VITD: 24.25 ng/mL — ABNORMAL LOW (ref 30.00–100.00)

## 2015-05-07 LAB — HEPATITIS C ANTIBODY: HCV Ab: NEGATIVE

## 2015-06-20 ENCOUNTER — Other Ambulatory Visit: Payer: Self-pay | Admitting: Family Medicine

## 2015-06-26 ENCOUNTER — Other Ambulatory Visit: Payer: Self-pay | Admitting: Family Medicine

## 2015-07-29 ENCOUNTER — Ambulatory Visit: Payer: PPO | Admitting: Medical

## 2015-08-12 DIAGNOSIS — Z124 Encounter for screening for malignant neoplasm of cervix: Secondary | ICD-10-CM | POA: Diagnosis not present

## 2015-08-12 DIAGNOSIS — Z6829 Body mass index (BMI) 29.0-29.9, adult: Secondary | ICD-10-CM | POA: Diagnosis not present

## 2015-08-12 DIAGNOSIS — Z1231 Encounter for screening mammogram for malignant neoplasm of breast: Secondary | ICD-10-CM | POA: Diagnosis not present

## 2015-08-18 ENCOUNTER — Other Ambulatory Visit: Payer: Self-pay | Admitting: Obstetrics and Gynecology

## 2015-08-18 DIAGNOSIS — R928 Other abnormal and inconclusive findings on diagnostic imaging of breast: Secondary | ICD-10-CM

## 2015-09-01 ENCOUNTER — Ambulatory Visit
Admission: RE | Admit: 2015-09-01 | Discharge: 2015-09-01 | Disposition: A | Discharge status: Home / Self Care | Payer: PPO | Source: Ambulatory Visit | Attending: Obstetrics and Gynecology | Admitting: Obstetrics and Gynecology

## 2015-09-01 DIAGNOSIS — R928 Other abnormal and inconclusive findings on diagnostic imaging of breast: Secondary | ICD-10-CM

## 2015-09-01 DIAGNOSIS — N63 Unspecified lump in breast: Secondary | ICD-10-CM | POA: Diagnosis not present

## 2015-09-08 ENCOUNTER — Ambulatory Visit (INDEPENDENT_AMBULATORY_CARE_PROVIDER_SITE_OTHER): Payer: PPO | Admitting: Medical

## 2015-09-08 ENCOUNTER — Encounter: Payer: Self-pay | Admitting: Medical

## 2015-09-08 VITALS — BP 136/86 | HR 68 | Temp 98.1°F | Ht 65.0 in | Wt 179.4 lb

## 2015-09-08 DIAGNOSIS — T7840XA Allergy, unspecified, initial encounter: Secondary | ICD-10-CM | POA: Diagnosis not present

## 2015-09-08 MED ORDER — PREDNISONE 10 MG PO TABS: ORAL_TABLET | ORAL | Status: DC

## 2015-09-08 MED ORDER — HYDROXYZINE HCL 10 MG PO TABS: ORAL_TABLET | ORAL | Status: DC

## 2015-09-08 MED ORDER — FAMCICLOVIR 500 MG PO TABS: 500.0000 mg | ORAL_TABLET | Freq: Three times a day (TID) | ORAL | Status: DC

## 2015-09-08 MED ORDER — METHYLPREDNISOLONE ACETATE 40 MG/ML IJ SUSP
40.0000 mg | Freq: Once | INTRAMUSCULAR | Status: AC
  Administered 2015-09-08: 40 mg via INTRAMUSCULAR

## 2015-09-08 NOTE — Addendum Note (Signed)
Addended by: Tasia Catchings on: 09/08/2015 04:02 PM   Modules accepted: Orders

## 2015-09-08 NOTE — Patient Instructions (Addendum)
You appear to have allergic reaction. Reaction to poison ivy by history. Depomedrol 40 mg IM. Rx taper prednisone. Also hydroxyzine for itching.  If you have increased sharp or spreading rash despite the above then in that event start famvir.(made available but don't fill yet)  Follow up 7 days or as needed

## 2015-09-08 NOTE — Progress Notes (Signed)
Pre visit review using our clinic review tool, if applicable. No additional management support is needed unless otherwise documented below in the visit note. 

## 2015-09-08 NOTE — Progress Notes (Signed)
Subjective:    Patient ID: Erika [name], female    DOB: 12/25/1945, 70 y.o.   MRN: [id number]  HPI  Pt in reporting that she cut grass end of last week. She thinks she may have rubbed up against poison ivy. The area does itch. Annoying discomfort. Pt seems to remember leaves rubbing against her arm.   No sharp or stabbing pain.  Pt had shingles shot about one year.     Review of Systems  Constitutional: Negative for fever, chills and fatigue.  Respiratory: Negative for cough, choking, shortness of breath and wheezing.   Cardiovascular: Negative for chest pain and palpitations.  Skin: Positive for rash.  Neurological: Negative for dizziness, syncope, weakness and headaches.  Hematological: Negative for adenopathy. Does not bruise/bleed easily.  Psychiatric/Behavioral: Negative for behavioral problems, confusion and dysphoric mood. The patient is not nervous/anxious.    Past Medical History  Diagnosis Date  . Diverticulosis of colon     colonoscopy 04/14/1999  . Obesity   . Chicken pox as a child  . Measles as a child  . Mumps as a child  . GERD (gastroesophageal reflux disease)   . Hyperlipidemia   . Hypertension   . Osteopenia   . History of fibrocystic disease of breast   . Bronchitis 04/02/2011  . Arthritis 04/02/2011  . IBS (irritable bowel syndrome) 04/02/2011  . Overactive bladder 04/02/2011  . Left shoulder strain 06/14/2011  . Dry mouth 03/13/2012  . Preventative health care 03/13/2012  . Allergic state 06/24/2012  . Hypokalemia 06/24/2012  . Contusion of leg, left 08/08/2012  . Anterior cervical lymphadenopathy 02/08/2013  . Shoulder pain, bilateral 06/14/2011  . Overweight   . Hyperlipidemia, mixed 02/20/2007    Followed as Primary Care Patient/ [facility]/ [facility]  Goal LDL < 130 HBP/Pos fm hx      . Hyponatremia 08/02/2014  . Fatigue 08/02/2014     Social History   Social History  . Marital Status: Married    Spouse Name: N/A  . Number of Children: 4  .  Years of Education: N/A   Occupational History  . dental hygenist    Social History Main Topics  . Smoking status: Never Smoker   . Smokeless tobacco: Never Used  . Alcohol Use: No  . Drug Use: No  . Sexual Activity: Yes   Other Topics Concern  . Not on file   Social History Narrative   4 children 2 are adopted    Past Surgical History  Procedure Laterality Date  . Breast reduction surgery  1986  . Bunionectomy Bilateral 1980  . Mouth surgery  01-10-13    negative  . Hammer toe surgery Right 12-03-13    foot  . Tonsillectomy      Family History  Problem Relation Age of Onset  . Stroke Mother 26  . Hyperlipidemia Mother   . Hypertension Mother   . Heart attack Father 48  . Hypertension Father   . Heart disease Father   . Hypertension Brother   . Aneurysm Brother     aortic  . Hyperlipidemia Brother   . Heart disease Brother     aortic aneurysm rupture  . Multiple sclerosis Daughter   . Alcohol abuse Son   . Schizophrenia Son   . Bipolar disorder Son   . Mental illness Son     schizophrenic, bipolar    No Known Allergies  Current Outpatient Prescriptions on File Prior to Visit  Medication Sig Dispense Refill  .  amLODipine (NORVASC) 10 MG tablet TAKE ONE TABLET BY MOUTH DAILY. 30 tablet 8  . aspirin 81 MG tablet Take 81 mg by mouth daily.      Marland Kitchen atorvastatin (LIPITOR) 10 MG tablet TAKE 0.5 TABLETS (5 MG TOTAL) BY MOUTH DAILY AT 6 PM. 15 tablet 3  . calcium carbonate (TUMS) 500 MG chewable tablet as needed.      . cetirizine (ZYRTEC) 10 MG tablet Take 10 mg by mouth at bedtime as needed.      . cycloSPORINE (RESTASIS) 0.05 % ophthalmic emulsion Place 1 drop into both eyes 2 (two) times daily.    . diphenhydramine-acetaminophen (TYLENOL PM) 25-500 MG TABS Take 1 tablet by mouth at bedtime as needed.      . metoprolol succinate (TOPROL-XL) 50 MG 24 hr tablet TAKE 1 TABLET BY MOUTH EVERY DAY WITH OR IMMEDIATELY FOLLOWING A MEAL 30 tablet 6  . Multiple  Minerals-Vitamins (CITRACAL PLUS PO) Take 1 tablet by mouth daily.      Marland Kitchen omeprazole (PRILOSEC) 20 MG capsule TAKE ONE CAPSULE BY MOUTH EVERY DAY 90 capsule 2  . OVER THE COUNTER MEDICATION MegaRed- 1 daily    . ranitidine (ZANTAC) 300 MG tablet TAKE 1 TABLET (300 MG TOTAL) BY MOUTH AT BEDTIME. 30 tablet 3  . triamterene-hydrochlorothiazide (MAXZIDE-25) 37.5-25 MG tablet TAKE 1 TABLET BY MOUTH DAILY. 30 tablet 3  . trospium (SANCTURA) 20 MG tablet Take 20 mg by mouth daily.      . valsartan (DIOVAN) 320 MG tablet TAKE 1 TABLET BY MOUTH EVERY DAY 30 tablet 5   No current facility-administered medications on file prior to visit.    BP 136/86 mmHg  Pulse 68  Temp(Src) 98.1 F (36.7 C) (Oral)  Ht 5\' 5"  (1.651 m)  Wt 179 lb 6.4 oz (81.375 kg)  BMI 29.85 kg/m2  SpO2 98%       Objective:   Physical Exam  General- No acute distress. Pleasant patient. Neck- Full range of motion, no jvd Lungs- Clear, even and unlabored. Heart- regular rate and rhythm. Neurologic- CNII- XII grossly intact.  Skin- back of rt arm. Appears to have group of vesicles/blisterand papules on tricep. Also 2 areas on forearm that appear like groupes vesicles with some scattered papules.       Assessment & Plan:  You appear to have allergic reaction. Reaction to poison ivy by history. Depomedrol 40 mg IM. Rx taper prednisone. Also hydroxyzine for itching.  If you have increased sharp or spreading rash despite the above then in that event start famvir.  Follow up 7 days or as needed  I thought some atypical features of this rash. And did make famvir available.  Shawniece Oyola, Percell Miller, PA-C

## 2015-09-30 ENCOUNTER — Other Ambulatory Visit: Payer: Self-pay | Admitting: Family Medicine

## 2015-10-13 DIAGNOSIS — H2511 Age-related nuclear cataract, right eye: Secondary | ICD-10-CM | POA: Diagnosis not present

## 2015-10-13 DIAGNOSIS — H52223 Regular astigmatism, bilateral: Secondary | ICD-10-CM | POA: Diagnosis not present

## 2015-10-13 DIAGNOSIS — H5203 Hypermetropia, bilateral: Secondary | ICD-10-CM | POA: Diagnosis not present

## 2015-10-13 DIAGNOSIS — H26051 Posterior subcapsular polar infantile and juvenile cataract, right eye: Secondary | ICD-10-CM | POA: Diagnosis not present

## 2015-10-13 DIAGNOSIS — H04123 Dry eye syndrome of bilateral lacrimal glands: Secondary | ICD-10-CM | POA: Diagnosis not present

## 2015-10-18 ENCOUNTER — Other Ambulatory Visit: Payer: Self-pay | Admitting: Family Medicine

## 2015-11-02 ENCOUNTER — Other Ambulatory Visit: Payer: Self-pay | Admitting: Family Medicine

## 2015-12-08 ENCOUNTER — Ambulatory Visit (INDEPENDENT_AMBULATORY_CARE_PROVIDER_SITE_OTHER): Payer: PPO | Admitting: Orthopaedic Surgery

## 2015-12-08 DIAGNOSIS — M25562 Pain in left knee: Secondary | ICD-10-CM

## 2015-12-20 ENCOUNTER — Ambulatory Visit: Payer: PPO | Admitting: Family Medicine

## 2016-01-04 ENCOUNTER — Other Ambulatory Visit: Payer: Self-pay | Admitting: Family Medicine

## 2016-01-04 ENCOUNTER — Encounter: Payer: Self-pay | Admitting: Family Medicine

## 2016-01-04 ENCOUNTER — Ambulatory Visit (INDEPENDENT_AMBULATORY_CARE_PROVIDER_SITE_OTHER): Payer: PPO | Admitting: Family Medicine

## 2016-01-04 VITALS — BP 172/72 | HR 50 | Temp 98.2°F | Wt 176.2 lb

## 2016-01-04 DIAGNOSIS — E559 Vitamin D deficiency, unspecified: Secondary | ICD-10-CM

## 2016-01-04 DIAGNOSIS — Z0000 Encounter for general adult medical examination without abnormal findings: Secondary | ICD-10-CM

## 2016-01-04 DIAGNOSIS — E782 Mixed hyperlipidemia: Secondary | ICD-10-CM

## 2016-01-04 DIAGNOSIS — I10 Essential (primary) hypertension: Secondary | ICD-10-CM | POA: Diagnosis not present

## 2016-01-04 DIAGNOSIS — E663 Overweight: Secondary | ICD-10-CM

## 2016-01-04 DIAGNOSIS — R7989 Other specified abnormal findings of blood chemistry: Secondary | ICD-10-CM

## 2016-01-04 DIAGNOSIS — M8580 Other specified disorders of bone density and structure, unspecified site: Secondary | ICD-10-CM

## 2016-01-04 LAB — COMPREHENSIVE METABOLIC PANEL
ALBUMIN: 4.3 g/dL (ref 3.5–5.2)
ALT: 16 U/L (ref 0–35)
AST: 17 U/L (ref 0–37)
Alkaline Phosphatase: 76 U/L (ref 39–117)
BUN: 9 mg/dL (ref 6–23)
CHLORIDE: 97 meq/L (ref 96–112)
CO2: 30 mEq/L (ref 19–32)
CREATININE: 0.95 mg/dL (ref 0.40–1.20)
Calcium: 9.8 mg/dL (ref 8.4–10.5)
GFR: 61.82 mL/min (ref >60.00)
Glucose, Bld: 95 mg/dL (ref 70–99)
Potassium: 4.3 mEq/L (ref 3.5–5.1)
SODIUM: 134 meq/L — AB (ref 135–145)
Total Bilirubin: 0.6 mg/dL (ref 0.2–1.2)
Total Protein: 7.3 g/dL (ref 6.0–8.3)

## 2016-01-04 LAB — CBC
HCT: 35.1 % — ABNORMAL LOW (ref 36.0–46.0)
Hemoglobin: 12 g/dL (ref 12.0–15.0)
MCHC: 34.2 g/dL (ref 30.0–36.0)
MCV: 84.1 fl (ref 78.0–100.0)
PLATELETS: 308 10*3/uL (ref 150.0–400.0)
RBC: 4.17 Mil/uL (ref 3.87–5.11)
RDW: 13.6 % (ref 11.5–15.5)
WBC: 5.8 10*3/uL (ref 4.0–10.5)

## 2016-01-04 LAB — LIPID PANEL
CHOLESTEROL: 200 mg/dL (ref 0–200)
HDL: 46.6 mg/dL (ref >39.00)
LDL CALC: 122 mg/dL — AB (ref 0–99)
NonHDL: 153.61
Total CHOL/HDL Ratio: 4
Triglycerides: 156 mg/dL — ABNORMAL HIGH (ref 0.0–149.0)
VLDL: 31.2 mg/dL (ref 0.0–40.0)

## 2016-01-04 LAB — TSH: TSH: 0.77 u[IU]/mL (ref 0.35–4.50)

## 2016-01-04 LAB — VITAMIN D 25 HYDROXY (VIT D DEFICIENCY, FRACTURES): VITD: 29.73 ng/mL — AB (ref 30.00–100.00)

## 2016-01-04 MED ORDER — OLMESARTAN MEDOXOMIL 40 MG PO TABS: 40.0000 mg | ORAL_TABLET | Freq: Every day | ORAL | 1 refills | Status: DC

## 2016-01-04 MED ORDER — VITAMIN D (ERGOCALCIFEROL) 1.25 MG (50000 UNIT) PO CAPS: 50000.0000 [IU] | ORAL_CAPSULE | ORAL | 4 refills | Status: DC

## 2016-01-04 NOTE — Patient Instructions (Signed)
NOW probiotics daily, 1 cap daily   Hypertension Hypertension, commonly called high blood pressure, is when the force of blood pumping through your arteries is too strong. Your arteries are the blood vessels that carry blood from your heart throughout your body. A blood pressure reading consists of a higher number over a lower number, such as 110/72. The higher number (systolic) is the pressure inside your arteries when your heart pumps. The lower number (diastolic) is the pressure inside your arteries when your heart relaxes. Ideally you want your blood pressure below 120/80. Hypertension forces your heart to work harder to pump blood. Your arteries may become narrow or stiff. Having untreated or uncontrolled hypertension can cause heart attack, stroke, kidney disease, and other problems. RISK FACTORS Some risk factors for high blood pressure are controllable. Others are not.  Risk factors you cannot control include:   Race. You may be at higher risk if you are African American.  Age. Risk increases with age.  Gender. Men are at higher risk than women before age 50 years. After age 13, women are at higher risk than men. Risk factors you can control include:  Not getting enough exercise or physical activity.  Being overweight.  Getting too much fat, sugar, calories, or salt in your diet.  Drinking too much alcohol. SIGNS AND SYMPTOMS Hypertension does not usually cause signs or symptoms. Extremely high blood pressure (hypertensive crisis) may cause headache, anxiety, shortness of breath, and nosebleed. DIAGNOSIS To check if you have hypertension, your health care provider will measure your blood pressure while you are seated, with your arm held at the level of your heart. It should be measured at least twice using the same arm. Certain conditions can cause a difference in blood pressure between your right and left arms. A blood pressure reading that is higher than normal on one occasion  does not mean that you need treatment. If it is not clear whether you have high blood pressure, you may be asked to return on a different day to have your blood pressure checked again. Or, you may be asked to monitor your blood pressure at home for 1 or more weeks. TREATMENT Treating high blood pressure includes making lifestyle changes and possibly taking medicine. Living a healthy lifestyle can help lower high blood pressure. You may need to change some of your habits. Lifestyle changes may include:  Following the DASH diet. This diet is high in fruits, vegetables, and whole grains. It is low in salt, red meat, and added sugars.  Keep your sodium intake below 2,300 mg per day.  Getting at least 30-45 minutes of aerobic exercise at least 4 times per week.  Losing weight if necessary.  Not smoking.  Limiting alcoholic beverages.  Learning ways to reduce stress. Your health care provider may prescribe medicine if lifestyle changes are not enough to get your blood pressure under control, and if one of the following is true:  You are 25-61 years of age and your systolic blood pressure is above 140.  You are 38 years of age or older, and your systolic blood pressure is above 150.  Your diastolic blood pressure is above 90.  You have diabetes, and your systolic blood pressure is over [id number] or your diastolic blood pressure is over 90.  You have kidney disease and your blood pressure is above 140/90.  You have heart disease and your blood pressure is above 140/90. Your personal target blood pressure may vary depending on your medical conditions,  your age, and other factors. HOME CARE INSTRUCTIONS  Have your blood pressure rechecked as directed by your health care provider.   Take medicines only as directed by your health care provider. Follow the directions carefully. Blood pressure medicines must be taken as prescribed. The medicine does not work as well when you skip doses. Skipping  doses also puts you at risk for problems.  Do not smoke.   Monitor your blood pressure at home as directed by your health care provider. SEEK MEDICAL CARE IF:   You think you are having a reaction to medicines taken.  You have recurrent headaches or feel dizzy.  You have swelling in your ankles.  You have trouble with your vision. SEEK IMMEDIATE MEDICAL CARE IF:  You develop a severe headache or confusion.  You have unusual weakness, numbness, or feel faint.  You have severe chest or abdominal pain.  You vomit repeatedly.  You have trouble breathing. MAKE SURE YOU:   Understand these instructions.  Will watch your condition.  Will get help right away if you are not doing well or get worse.   This information is not intended to replace advice given to you by your health care provider. Make sure you discuss any questions you have with your health care provider.   Document Released: 02/20/2005 Document Revised: 07/07/2014 Document Reviewed: 12/13/2012 Elsevier Interactive Patient Education [employer].

## 2016-01-04 NOTE — Assessment & Plan Note (Signed)
Poorly controlled will alter medications, encouraged DASH diet, minimize caffeine and obtain adequate sleep. Report concerning symptoms and follow up as directed and as needed 

## 2016-01-04 NOTE — Progress Notes (Signed)
Pre visit review using our clinic review tool, if applicable. No additional management support is needed unless otherwise documented below in the visit note. 

## 2016-01-06 ENCOUNTER — Other Ambulatory Visit: Payer: Self-pay | Admitting: Family Medicine

## 2016-01-06 ENCOUNTER — Telehealth: Payer: Self-pay | Admitting: Family Medicine

## 2016-01-06 MED ORDER — ATORVASTATIN CALCIUM 10 MG PO TABS: 10.0000 mg | ORAL_TABLET | Freq: Every day | ORAL | 2 refills | Status: DC

## 2016-01-06 MED ORDER — TRIAMTERENE-HCTZ 37.5-25 MG PO TABS: 1.0000 | ORAL_TABLET | Freq: Every day | ORAL | 1 refills | Status: DC

## 2016-01-06 MED ORDER — METOPROLOL SUCCINATE ER 50 MG PO TB24: ORAL_TABLET | ORAL | 1 refills | Status: DC

## 2016-01-06 NOTE — Telephone Encounter (Signed)
Patient was prescribed olmesartan (BENICAR) 40 MG tablet and she has picked it up. However she is concerned that she is taking too many BP medications. She would like to know if she needs to stop taking any of the other ones. Please advise.   Patient phone: [phone]

## 2016-01-06 NOTE — Addendum Note (Signed)
Addended by: Sharon Seller B on: 01/06/2016 03:46 PM   Modules accepted: Orders

## 2016-01-06 NOTE — Telephone Encounter (Signed)
She was not controlled on the other meds, this is a good addition. Many people need multiple different types of meds to control BP. If she is concerned I am happy to refer to cardiology for consultation for resistent htn.

## 2016-01-07 NOTE — Telephone Encounter (Signed)
Patient informed of PCP instructions and does respect PCP's advise at this time.

## 2016-01-09 DIAGNOSIS — R7989 Other specified abnormal findings of blood chemistry: Secondary | ICD-10-CM

## 2016-01-09 HISTORY — DX: Other specified abnormal findings of blood chemistry: R79.89

## 2016-01-09 NOTE — Assessment & Plan Note (Signed)
Encouraged to get adequate exercise, calcium and vitamin d intake 

## 2016-01-09 NOTE — Assessment & Plan Note (Signed)
Labs reveal deficiency. Start on Vitamin D 50000 IU caps, 1 cap po weekly x 12 weeks. Disp #4 with 4 rf. Also take daily Vitamin D over the counter. If already taking a daily supplement increase by 1000 IU daily and if not start Vitamin D 2000 IU daily.  

## 2016-01-09 NOTE — Assessment & Plan Note (Signed)
Encouraged DASH diet, decrease po intake and increase exercise as tolerated. Needs 7-8 hours of sleep nightly. Avoid trans fats, eat small, frequent meals every 4-5 hours with lean proteins, complex carbs and healthy fats. Minimize simple carbs 

## 2016-01-09 NOTE — Assessment & Plan Note (Signed)
Tolerating statin, encouraged heart healthy diet, avoid trans fats, minimize simple carbs and saturated fats. Increase exercise as tolerated 

## 2016-01-09 NOTE — Progress Notes (Signed)
Patient ID: Erika [name], female   DOB: Nov 23, 1945, 70 y.o.   MRN: [id number]   Subjective:    Patient ID: Erika [name], female    DOB: 15-Aug-1945, 70 y.o.   MRN: [id number]  Chief Complaint  Patient presents with  . Hypertension    HPI Patient is in today for follow up and no acute concerns. No recent hospitalizations. Notes some worsening of dry mouth and joint pains. No falls or injury. Denies CP/palp/SOB/HA/congestion/fevers/GI or GU c/o. Taking meds as prescribed  Past Medical History:  Diagnosis Date  . Allergic state 06/24/2012  . Anterior cervical lymphadenopathy 02/08/2013  . Arthritis 04/02/2011  . Bronchitis 04/02/2011  . Chicken pox as a child  . Contusion of leg, left 08/08/2012  . Diverticulosis of colon    colonoscopy 04/14/1999  . Dry mouth 03/13/2012  . Fatigue 08/02/2014  . GERD (gastroesophageal reflux disease)   . History of fibrocystic disease of breast   . Hyperlipidemia   . Hyperlipidemia, mixed 02/20/2007   Followed as Primary Care Patient/ [facility]/ [facility]  Goal LDL < 130 HBP/Pos fm hx      . Hypertension   . Hypokalemia 06/24/2012  . Hyponatremia 08/02/2014  . IBS (irritable bowel syndrome) 04/02/2011  . Left shoulder strain 06/14/2011  . Measles as a child  . Mumps as a child  . Obesity   . Osteopenia   . Overactive bladder 04/02/2011  . Overweight   . Preventative health care 03/13/2012  . Shoulder pain, bilateral 06/14/2011    Past Surgical History:  Procedure Laterality Date  . BREAST REDUCTION SURGERY  1986  . BUNIONECTOMY Bilateral 1980  . HAMMER TOE SURGERY Right 12-03-13   foot  . MOUTH SURGERY  01-10-13   negative  . TONSILLECTOMY      Family History  Problem Relation Age of Onset  . Stroke Mother 57  . Hyperlipidemia Mother   . Hypertension Mother   . Heart attack Father 42  . Hypertension Father   . Heart disease Father   . Hypertension Brother   . Aneurysm Brother     aortic  . Hyperlipidemia Brother   . Heart disease  Brother     aortic aneurysm rupture  . Multiple sclerosis Daughter   . Alcohol abuse Son   . Schizophrenia Son   . Bipolar disorder Son   . Mental illness Son     schizophrenic, bipolar    Social History   Social History  . Marital status: Married    Spouse name: N/A  . Number of children: 4  . 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 Smoking status: Never Smoker  . Smokeless tobacco: Never Used  . Alcohol use No  . Drug use: No  . Sexual activity: Yes   Other Topics Concern  . Not on file   Social History Narrative   4 children 2 are adopted    Outpatient Medications Prior to Visit  Medication Sig Dispense Refill  . amLODipine (NORVASC) 10 MG tablet TAKE ONE TABLET BY MOUTH DAILY. 30 tablet 8  . aspirin 81 MG tablet Take 81 mg by mouth daily.      . calcium carbonate (TUMS) 500 MG chewable tablet as needed.      . cetirizine (ZYRTEC) 10 MG tablet Take 10 mg by mouth at bedtime as needed.      . cycloSPORINE (RESTASIS) 0.05 % ophthalmic emulsion  Place 1 drop into both eyes 2 (two) times daily.    . diphenhydramine-acetaminophen (TYLENOL PM) 25-500 MG TABS Take 1 tablet by mouth at bedtime as needed.      . Multiple Minerals-Vitamins (CITRACAL PLUS PO) Take 1 tablet by mouth daily.      Marland Kitchen omeprazole (PRILOSEC) 20 MG capsule TAKE ONE CAPSULE BY MOUTH EVERY DAY 90 capsule 2  . OVER THE COUNTER MEDICATION MegaRed- 1 daily    . ranitidine (ZANTAC) 300 MG tablet TAKE 1 TABLET (300 MG TOTAL) BY MOUTH AT BEDTIME. 30 tablet 3  . trospium (SANCTURA) 20 MG tablet Take 20 mg by mouth daily.      Marland Kitchen atorvastatin (LIPITOR) 10 MG tablet TAKE 0.5 TABLETS (5 MG TOTAL) BY MOUTH DAILY AT 6 PM. 15 tablet 3  . hydrOXYzine (ATARAX/VISTARIL) 10 MG tablet 1-2 tab po tid prn anxiety 30 tablet 0  . metoprolol succinate (TOPROL-XL) 50 MG 24 hr tablet TAKE 1 TABLET BY MOUTH EVERY DAY WITH OR IMMEDIATELY FOLLOWING A MEAL 30 tablet 6  .  predniSONE (DELTASONE) 10 MG tablet 6 tab po day 1, then  5 tab po day 2, then  4 tab po day 3, then 3 tab po day 4 then 2 tab po day 5, and 1 tab po day 6 21 tablet 0  . triamterene-hydrochlorothiazide (MAXZIDE-25) 37.5-25 MG tablet TAKE 1 TABLET BY MOUTH DAILY. 30 tablet 3  . valsartan (DIOVAN) 320 MG tablet TAKE 1 TABLET BY MOUTH EVERY DAY 30 tablet 5  . famciclovir (FAMVIR) 500 MG tablet Take 1 tablet (500 mg total) by mouth 3 (three) times daily. 21 tablet 0   No facility-administered medications prior to visit.     No Known Allergies  Review of Systems  Constitutional: Positive for malaise/fatigue. Negative for fever.  HENT: Negative for congestion.   Eyes: Negative for blurred vision.  Respiratory: Negative for shortness of breath.   Cardiovascular: Negative for chest pain, palpitations and leg swelling.  Gastrointestinal: Negative for abdominal pain, blood in stool and nausea.  Genitourinary: Negative for dysuria and frequency.  Musculoskeletal: Positive for joint pain. Negative for falls.  Skin: Negative for rash.  Neurological: Negative for dizziness, loss of consciousness and headaches.  Endo/Heme/Allergies: Negative for environmental allergies.  Psychiatric/Behavioral: Negative for depression. The patient is not nervous/anxious.        Objective:    Physical Exam  Constitutional: She is oriented to person, place, and time. She appears well-developed and well-nourished. No distress.  HENT:  Head: Normocephalic and atraumatic.  Nose: Nose normal.  Eyes: Right eye exhibits no discharge. Left eye exhibits no discharge.  Neck: Normal range of motion. Neck supple.  Cardiovascular: Normal rate and regular rhythm.   No murmur heard. Pulmonary/Chest: Effort normal and breath sounds normal.  Abdominal: Soft. Bowel sounds are normal. There is no tenderness.  Musculoskeletal: She exhibits no edema.  Neurological: She is alert and oriented to person, place, and time.  Skin:  Skin is warm and dry.  Psychiatric: She has a normal mood and affect.  Nursing note and vitals reviewed.   BP (!) 172/72 (BP Location: Left Arm, Patient Position: Sitting, Cuff Size: Normal)   Pulse (!) 50   Temp 98.2 F (36.8 C) (Oral)   Wt 176 lb 3.2 oz (79.9 kg)   SpO2 97%   BMI 29.32 kg/m  Wt Readings from Last 3 Encounters:  01/04/16 176 lb 3.2 oz (79.9 kg)  09/08/15 179 lb 6.4 oz (81.4 kg)  04/22/15  180 lb 8 oz (81.9 kg)     Lab Results  Component Value Date   WBC 5.8 01/04/2016   HGB 12.0 01/04/2016   HCT 35.1 (L) 01/04/2016   PLT 308.0 01/04/2016   GLUCOSE 95 01/04/2016   CHOL 200 01/04/2016   TRIG 156.0 (H) 01/04/2016   HDL 46.60 01/04/2016   LDLDIRECT 163.0 07/24/2014   LDLCALC 122 (H) 01/04/2016   ALT 16 01/04/2016   AST 17 01/04/2016   NA 134 (L) 01/04/2016   K 4.3 01/04/2016   CL 97 01/04/2016   CREATININE 0.95 01/04/2016   BUN 9 01/04/2016   CO2 30 01/04/2016   TSH 0.77 01/04/2016    Lab Results  Component Value Date   TSH 0.77 01/04/2016   Lab Results  Component Value Date   WBC 5.8 01/04/2016   HGB 12.0 01/04/2016   HCT 35.1 (L) 01/04/2016   MCV 84.1 01/04/2016   PLT 308.0 01/04/2016   Lab Results  Component Value Date   NA 134 (L) 01/04/2016   K 4.3 01/04/2016   CO2 30 01/04/2016   GLUCOSE 95 01/04/2016   BUN 9 01/04/2016   CREATININE 0.95 01/04/2016   BILITOT 0.6 01/04/2016   ALKPHOS 76 01/04/2016   AST 17 01/04/2016   ALT 16 01/04/2016   PROT 7.3 01/04/2016   ALBUMIN 4.3 01/04/2016   CALCIUM 9.8 01/04/2016   GFR 61.82 01/04/2016   Lab Results  Component Value Date   CHOL 200 01/04/2016   Lab Results  Component Value Date   HDL 46.60 01/04/2016   Lab Results  Component Value Date   LDLCALC 122 (H) 01/04/2016   Lab Results  Component Value Date   TRIG 156.0 (H) 01/04/2016   Lab Results  Component Value Date   CHOLHDL 4 01/04/2016   No results found for: HGBA1C     Assessment & Plan:   Problem List Items  Addressed This Visit    Hyperlipidemia, mixed    Tolerating statin, encouraged heart healthy diet, avoid trans fats, minimize simple carbs and saturated fats. Increase exercise as tolerated      Relevant Medications   olmesartan (BENICAR) 40 MG tablet   Hypertension    Poorly controlled will alter medications, encouraged DASH diet, minimize caffeine and obtain adequate sleep. Report concerning symptoms and follow up as directed and as needed      Relevant Medications   olmesartan (BENICAR) 40 MG tablet   Other Relevant Orders   Comprehensive metabolic panel (Completed)   TSH (Completed)   CBC (Completed)   Overweight    Encouraged DASH diet, decrease po intake and increase exercise as tolerated. Needs 7-8 hours of sleep nightly. Avoid trans fats, eat small, frequent meals every 4-5 hours with lean proteins, complex carbs and healthy fats. Minimize simple carbs      Osteopenia    Encouraged to get adequate exercise, calcium and vitamin d intake      Preventative health care - Primary   Low vitamin D level    Labs reveal deficiency. Start on Vitamin D 50000 IU caps, 1 cap po weekly x 12 weeks. Disp #4 with 4 rf. Also take daily Vitamin D over the counter. If already taking a daily supplement increase by 1000 IU daily and if not start Vitamin D 2000 IU daily.       Relevant Orders   VITAMIN D 25 Hydroxy (Vit-D Deficiency, Fractures) (Completed)    Other Visit Diagnoses    Mixed hyperlipidemia  Relevant Medications   olmesartan (BENICAR) 40 MG tablet   Other Relevant Orders   Lipid panel (Completed)      I have discontinued Ms. Beckerman's predniSONE, hydrOXYzine, famciclovir, and valsartan. I am also having her start on olmesartan. Additionally, I am having her maintain her aspirin, Multiple Minerals-Vitamins (CITRACAL PLUS PO), trospium, calcium carbonate, diphenhydramine-acetaminophen, cetirizine, cycloSPORINE, OVER THE COUNTER MEDICATION, omeprazole, amLODipine, and  ranitidine.  Meds ordered this encounter  Medications  . olmesartan (BENICAR) 40 MG tablet    Sig: Take 1 tablet (40 mg total) by mouth daily.    Dispense:  90 tablet    Refill:  1     Penni Homans, MD

## 2016-01-11 ENCOUNTER — Ambulatory Visit (INDEPENDENT_AMBULATORY_CARE_PROVIDER_SITE_OTHER): Payer: PPO | Admitting: Orthopaedic Surgery

## 2016-01-11 DIAGNOSIS — M25562 Pain in left knee: Secondary | ICD-10-CM

## 2016-01-11 NOTE — Progress Notes (Signed)
Erika [name] responded very well to steroid injection in her left knee. She is doing great. She turns 70 this week. She is ready to get back to full exercise activities again. On examination of her left knee there is no effusion she has excellent range of motion of her knee as well. Her knee is MC stable.  At this point she'll still work on quad strengthening exercises. She can always have a repeat steroid injection or even I a supplement injection in her knee down the road if needed. She'll follow-up when necessary

## 2016-01-18 ENCOUNTER — Telehealth: Payer: Self-pay | Admitting: Family Medicine

## 2016-01-18 MED ORDER — TELMISARTAN 40 MG PO TABS: 40.0000 mg | ORAL_TABLET | Freq: Every day | ORAL | 2 refills | Status: DC

## 2016-01-18 NOTE — Telephone Encounter (Signed)
Patient is still feeling swimmy headed/can hear her heart beating in her ears. She did not take a olmesartan today.Also she will pickup the new BP medication, does she immediately start being she still is having some symptoms?? Scheduled her a nurse visit for this coming Friday/[place] and leaves Saturday.

## 2016-01-18 NOTE — Telephone Encounter (Signed)
Updated medication list and sent in telmisartan as PCP instructed sent in to [facility]. Called the patient to inform of change left message on cell/home number to call back.

## 2016-01-18 NOTE — Telephone Encounter (Signed)
Spoke with PCP and she would like the patient to hold on taking the new medication today.  She will start taking it on Wednesday.  Was also hold by PCP to hold on any caffeine.  The patient verbally understood/agreed to do all instructions and if anything changes to call our office.

## 2016-01-18 NOTE — Telephone Encounter (Signed)
Have her stop it and try one more, Telmesartan 40 mg daily, disp #30 with 2 rf, bp check in 1-2 weeks

## 2016-01-18 NOTE — Telephone Encounter (Signed)
Pt called in stating that she seen provider and was introduced to a new blood pressure  Medication olmesartan. Pt says that it is causing her to have headaches and also has fell twice. Pt says that she was advised by pcp to call in if medication causes effects.      [phone]

## 2016-01-21 ENCOUNTER — Ambulatory Visit (INDEPENDENT_AMBULATORY_CARE_PROVIDER_SITE_OTHER): Payer: PPO | Admitting: Family Medicine

## 2016-01-21 VITALS — BP 152/72 | HR 64

## 2016-01-21 DIAGNOSIS — I10 Essential (primary) hypertension: Secondary | ICD-10-CM | POA: Diagnosis not present

## 2016-01-21 MED ORDER — METOPROLOL SUCCINATE ER 50 MG PO TB24: ORAL_TABLET | ORAL | 1 refills | Status: DC

## 2016-01-21 NOTE — Progress Notes (Signed)
Pre visit review using our clinic review tool, if applicable. No additional management support is needed unless otherwise documented below in the visit note.    Per 01/04/16 [id number] for 3-4 weeks rn bp check.  Pt reports compliance w/ medications, although she does not know her medications. "I'm taking whatever Dr. Charlett Blake tells me." Pt think she is still taking valsartan, which was dc'd by PCP at last office visit, but she is not sure, as she does not know the names of her meds.  Per Dr. Charlett Blake:  Increase metoprolol to twice daily.  Continue taking telmisartan and STOP valsartan. You should NOT continue taking both these medications together.   Return for BP check in approximately 1 week.   Pt notified of instructions and verbalized understanding. Reviewed instructions w/ pt and instructed her to verify her medications against med list printed today when she gets home and let us know if any issues/discrepancies.  Next appointment: 02/03/16 (due to holiday and pt out of town until 02/01/16)  Dorrene German, RN   RN blood pressure check note reviewed. Agree with documention and plan.  Penni Homans, MD

## 2016-01-21 NOTE — Progress Notes (Signed)
RN blood pressure check note reviewed. Agree with documention and plan. 

## 2016-01-21 NOTE — Patient Instructions (Addendum)
Per Dr. Charlett Blake:  Increase metoprolol to twice daily.  Continue taking telmisartan and STOP valsartan. You should NOT continue taking both these medications together.   Return for BP check in approximately 1 week.

## 2016-02-03 ENCOUNTER — Ambulatory Visit (INDEPENDENT_AMBULATORY_CARE_PROVIDER_SITE_OTHER): Payer: PPO | Admitting: Family Medicine

## 2016-02-03 VITALS — BP 145/56 | HR 66

## 2016-02-03 DIAGNOSIS — I10 Essential (primary) hypertension: Secondary | ICD-10-CM

## 2016-02-03 MED ORDER — METOPROLOL SUCCINATE ER 50 MG PO TB24: 50.0000 mg | ORAL_TABLET | Freq: Three times a day (TID) | ORAL | 1 refills | Status: DC

## 2016-02-03 NOTE — Patient Instructions (Signed)
Increase Metoprolol 50 mg to 1 tablet 3 times daily. Return to office for BP check on 03/02/16 @9 :30 a.m.

## 2016-02-03 NOTE — Progress Notes (Signed)
Nurseblood pressure check note reviewed. Agree with documention and plan. 

## 2016-02-03 NOTE — Progress Notes (Signed)
Pre visit review using our clinic tool,if applicable. No additional management support is needed unless otherwise documented below in the visit note.   Patient in for BP check per Dr. Charlett Blake.  BP= 145/56 P=66  Nurse blood pressure check note reviewed. Agree with documention and plan.  Penni Homans, MD

## 2016-02-09 ENCOUNTER — Telehealth: Payer: Self-pay | Admitting: Family Medicine

## 2016-02-09 NOTE — Telephone Encounter (Signed)
Called pt back- it looks like her metoprolol was increased to 50 TID (from BID) at last visit about 3 weeks ago.    Pt notes she had not noted any SE from the increase of medication She has not felt lightheaded, but [facility] No CP or SOB Her [facility] 61 [facility] Advised to hold her mid- day dose of metoprolol today, and take just 1/2 of her pill this evening if she is still feeling shaky at all  Will send a message to Dr. Charlett Blake to follow-up with her as needed   BP Readings from Last 3 Encounters:  02/03/16 (!) 145/56  01/21/16 (!) 152/72  01/04/16 (!) 172/72

## 2016-02-09 NOTE — Telephone Encounter (Signed)
Have her come in next week for BP follow up if she is willing and see how she is feeling with dose adjustment

## 2016-02-09 NOTE — Telephone Encounter (Signed)
Patient stated that she had her BP checked this morning and it was 99/49 and then she checked it at home later and it was 100/51 after finishing Zumba. She would just like to know if this is too low or if she needs to be concerned. She states she is not in pain or scared but would just like to know if this is too low for her. Please advise.    Patient phone: [phone]

## 2016-02-10 NOTE — Telephone Encounter (Signed)
Patient scheduled for Monday RN BP check. Patient is feeling well. No shakes.

## 2016-02-14 ENCOUNTER — Ambulatory Visit (INDEPENDENT_AMBULATORY_CARE_PROVIDER_SITE_OTHER): Payer: PPO | Admitting: Family Medicine

## 2016-02-14 VITALS — BP 115/50 | HR 57

## 2016-02-14 DIAGNOSIS — I10 Essential (primary) hypertension: Secondary | ICD-10-CM | POA: Diagnosis not present

## 2016-02-14 LAB — COMPLETE METABOLIC PANEL WITH GFR
ALBUMIN: 3.8 g/dL (ref 3.6–5.1)
ALK PHOS: 62 U/L (ref 33–130)
ALT: 11 U/L (ref 6–29)
AST: 15 U/L (ref 10–35)
BILIRUBIN TOTAL: 0.4 mg/dL (ref 0.2–1.2)
BUN: 17 mg/dL (ref 7–25)
CO2: 27 mmol/L (ref 20–31)
CREATININE: 1.39 mg/dL — AB (ref 0.60–0.93)
Calcium: 9.2 mg/dL (ref 8.6–10.4)
Chloride: 101 mmol/L (ref 98–110)
GFR, EST AFRICAN AMERICAN: 44 mL/min — AB (ref >=60)
GFR, EST NON AFRICAN AMERICAN: 38 mL/min — AB (ref >=60)
GLUCOSE: 93 mg/dL (ref 65–99)
Potassium: 4.2 mmol/L (ref 3.5–5.3)
SODIUM: 137 mmol/L (ref 135–146)
TOTAL PROTEIN: 6.5 g/dL (ref 6.1–8.1)

## 2016-02-14 NOTE — Progress Notes (Signed)
Pre visit review using our clinic review tool, if applicable. No additional management support is needed unless otherwise documented below in the visit note.  Patient presented in clinic today for a blood pressure check. Reviewed medications & current regimen with the patient. She denies headache, blurred vision, dizziness or any other symptoms at this time. Readings were: BP 126/51 P 59 & BP 115/50 P 57.  Per Dr. Charlett Blake: STOP taking the Telmisartan, but continue taking Valsartan 320 MG daily along with all other medications. Complete CMP lab work today. Return in 3-4 weeks for a nurse visit to have blood pressure rechecked.  Patient was made aware of the provider's instructions & verbalized understanding.  Next appointment scheduled for 03/14/16 at 9:15 AM.  RN blood check note reviewed. Agree with documention and plan. BLYTH, STACEY

## 2016-02-14 NOTE — Patient Instructions (Addendum)
Per Dr. Charlett Blake: STOP taking the Telmisartan, but continue taking Valsartan 320 MG daily along with all other medications. Complete CMP lab work today. Return in 3-4 weeks for a nurse visit to have blood pressure rechecked.

## 2016-02-22 ENCOUNTER — Other Ambulatory Visit: Payer: Self-pay | Admitting: Family Medicine

## 2016-03-14 ENCOUNTER — Ambulatory Visit (INDEPENDENT_AMBULATORY_CARE_PROVIDER_SITE_OTHER): Payer: PPO | Admitting: Family Medicine

## 2016-03-14 VITALS — BP 128/51 | HR 59

## 2016-03-14 DIAGNOSIS — I10 Essential (primary) hypertension: Secondary | ICD-10-CM | POA: Diagnosis not present

## 2016-03-14 NOTE — Progress Notes (Signed)
Pre visit review using our clinic review tool, if applicable. No additional management support is needed unless otherwise documented below in the visit note.  Patient came in office for blood pressure check per OV note 02/14/16. Verified medications & regimen. She did not report any symptoms today. The readings were as follow: BP 131/52 P 60 & BP 128/51 P 59.  Per Dr. Charlett Blake: Continue current medications & regimen. Notify the office of any new symptoms or changes. Keep follow-up appointment with PCP on 05/15/16 at 8:00 AM.  Informed patient of the provider's recommendations. She voiced understanding. No further questions or concerns addressed prior to patient leaving the visit.  RN blood pressure note reviewed. Agree with documention and plan.  Penni Homans, MD

## 2016-03-14 NOTE — Patient Instructions (Addendum)
Per Dr. Charlett Blake: Continue current medications & regimen. Notify the office of any new symptoms or changes. Keep follow-up appointment with PCP on 05/15/16 at 8:00 AM.

## 2016-03-23 ENCOUNTER — Other Ambulatory Visit: Payer: Self-pay | Admitting: Family Medicine

## 2016-04-06 ENCOUNTER — Ambulatory Visit: Payer: PPO | Admitting: Family Medicine

## 2016-04-11 DIAGNOSIS — H02839 Dermatochalasis of unspecified eye, unspecified eyelid: Secondary | ICD-10-CM | POA: Diagnosis not present

## 2016-04-11 DIAGNOSIS — H2513 Age-related nuclear cataract, bilateral: Secondary | ICD-10-CM | POA: Diagnosis not present

## 2016-04-11 DIAGNOSIS — H25013 Cortical age-related cataract, bilateral: Secondary | ICD-10-CM | POA: Diagnosis not present

## 2016-04-11 DIAGNOSIS — H2511 Age-related nuclear cataract, right eye: Secondary | ICD-10-CM | POA: Diagnosis not present

## 2016-04-11 DIAGNOSIS — H18413 Arcus senilis, bilateral: Secondary | ICD-10-CM | POA: Diagnosis not present

## 2016-04-21 DIAGNOSIS — H5211 Myopia, right eye: Secondary | ICD-10-CM | POA: Diagnosis not present

## 2016-04-21 DIAGNOSIS — H5202 Hypermetropia, left eye: Secondary | ICD-10-CM | POA: Diagnosis not present

## 2016-04-21 DIAGNOSIS — H52222 Regular astigmatism, left eye: Secondary | ICD-10-CM | POA: Diagnosis not present

## 2016-04-21 DIAGNOSIS — H2511 Age-related nuclear cataract, right eye: Secondary | ICD-10-CM | POA: Diagnosis not present

## 2016-04-21 DIAGNOSIS — H2512 Age-related nuclear cataract, left eye: Secondary | ICD-10-CM | POA: Diagnosis not present

## 2016-04-21 DIAGNOSIS — H25811 Combined forms of age-related cataract, right eye: Secondary | ICD-10-CM | POA: Diagnosis not present

## 2016-05-05 DIAGNOSIS — H25812 Combined forms of age-related cataract, left eye: Secondary | ICD-10-CM | POA: Diagnosis not present

## 2016-05-05 DIAGNOSIS — H5212 Myopia, left eye: Secondary | ICD-10-CM | POA: Diagnosis not present

## 2016-05-05 DIAGNOSIS — H52223 Regular astigmatism, bilateral: Secondary | ICD-10-CM | POA: Diagnosis not present

## 2016-05-05 DIAGNOSIS — H2512 Age-related nuclear cataract, left eye: Secondary | ICD-10-CM | POA: Diagnosis not present

## 2016-05-15 ENCOUNTER — Ambulatory Visit (INDEPENDENT_AMBULATORY_CARE_PROVIDER_SITE_OTHER): Payer: PPO | Admitting: Family Medicine

## 2016-05-15 ENCOUNTER — Ambulatory Visit (HOSPITAL_BASED_OUTPATIENT_CLINIC_OR_DEPARTMENT_OTHER)
Admission: RE | Admit: 2016-05-15 | Discharge: 2016-05-15 | Disposition: A | Discharge status: Home / Self Care | Payer: PPO | Source: Ambulatory Visit | Attending: Family Medicine | Admitting: Family Medicine

## 2016-05-15 ENCOUNTER — Encounter: Payer: Self-pay | Admitting: Family Medicine

## 2016-05-15 VITALS — BP 122/82 | HR 59 | Temp 98.1°F | Ht 65.0 in | Wt 177.2 lb

## 2016-05-15 DIAGNOSIS — M25561 Pain in right knee: Secondary | ICD-10-CM | POA: Diagnosis not present

## 2016-05-15 DIAGNOSIS — E876 Hypokalemia: Secondary | ICD-10-CM | POA: Diagnosis not present

## 2016-05-15 DIAGNOSIS — M25562 Pain in left knee: Secondary | ICD-10-CM | POA: Insufficient documentation

## 2016-05-15 DIAGNOSIS — E782 Mixed hyperlipidemia: Secondary | ICD-10-CM | POA: Diagnosis not present

## 2016-05-15 DIAGNOSIS — R7989 Other specified abnormal findings of blood chemistry: Secondary | ICD-10-CM

## 2016-05-15 DIAGNOSIS — R0789 Other chest pain: Secondary | ICD-10-CM | POA: Diagnosis not present

## 2016-05-15 DIAGNOSIS — I10 Essential (primary) hypertension: Secondary | ICD-10-CM

## 2016-05-15 DIAGNOSIS — E559 Vitamin D deficiency, unspecified: Secondary | ICD-10-CM | POA: Diagnosis not present

## 2016-05-15 DIAGNOSIS — R079 Chest pain, unspecified: Secondary | ICD-10-CM | POA: Diagnosis not present

## 2016-05-15 HISTORY — DX: Pain in right knee: M25.561

## 2016-05-15 LAB — COMPREHENSIVE METABOLIC PANEL
ALT: 33 U/L (ref 0–35)
AST: 23 U/L (ref 0–37)
Albumin: 4 g/dL (ref 3.5–5.2)
Alkaline Phosphatase: 80 U/L (ref 39–117)
BUN: 14 mg/dL (ref 6–23)
CHLORIDE: 98 meq/L (ref 96–112)
CO2: 31 meq/L (ref 19–32)
Calcium: 9.8 mg/dL (ref 8.4–10.5)
Creatinine, Ser: 1.22 mg/dL — ABNORMAL HIGH (ref 0.40–1.20)
GFR: 46.27 mL/min — AB (ref >60.00)
GLUCOSE: 84 mg/dL (ref 70–99)
POTASSIUM: 3.4 meq/L — AB (ref 3.5–5.1)
SODIUM: 136 meq/L (ref 135–145)
TOTAL PROTEIN: 7.2 g/dL (ref 6.0–8.3)
Total Bilirubin: 0.5 mg/dL (ref 0.2–1.2)

## 2016-05-15 LAB — CBC
HEMATOCRIT: 33.3 % — AB (ref 36.0–46.0)
HEMOGLOBIN: 11.4 g/dL — AB (ref 12.0–15.0)
MCHC: 34.1 g/dL (ref 30.0–36.0)
MCV: 83.4 fl (ref 78.0–100.0)
Platelets: 328 10*3/uL (ref 150.0–400.0)
RBC: 3.99 Mil/uL (ref 3.87–5.11)
RDW: 14.6 % (ref 11.5–15.5)
WBC: 5.6 10*3/uL (ref 4.0–10.5)

## 2016-05-15 LAB — LIPID PANEL
CHOL/HDL RATIO: 5
Cholesterol: 192 mg/dL (ref 0–200)
HDL: 42.6 mg/dL (ref >39.00)
LDL CALC: 118 mg/dL — AB (ref 0–99)
NONHDL: 149.37
Triglycerides: 158 mg/dL — ABNORMAL HIGH (ref 0.0–149.0)
VLDL: 31.6 mg/dL (ref 0.0–40.0)

## 2016-05-15 MED ORDER — METOPROLOL SUCCINATE ER 50 MG PO TB24: ORAL_TABLET | ORAL | 1 refills | Status: DC

## 2016-05-15 MED ORDER — AMLODIPINE BESYLATE 10 MG PO TABS: 5.0000 mg | ORAL_TABLET | Freq: Two times a day (BID) | ORAL | 7 refills | Status: DC

## 2016-05-15 MED ORDER — TRIAMTERENE-HCTZ 37.5-25 MG PO TABS: 1.0000 | ORAL_TABLET | Freq: Every day | ORAL | 1 refills | Status: DC

## 2016-05-15 NOTE — Patient Instructions (Addendum)
Lidocaine gel to knees by salon pas, icy hot or aspercreme  Try Mylanta for heartburn, consider Omeprazole up to 40 mg in am if heartburn persists for 2 -4 weeks Call for follow up if pain does not resolve Food Choices for Gastroesophageal Reflux Disease, Adult When you have gastroesophageal reflux disease (GERD), the foods you eat and your eating habits are very important. Choosing the right foods can help ease your discomfort. What guidelines do I need to follow?  Choose fruits, vegetables, whole grains, and low-fat dairy products.  Choose low-fat meat, fish, and poultry.  Limit fats such as oils, salad dressings, butter, nuts, and avocado.  Keep a food diary. This helps you identify foods that cause symptoms.  Avoid foods that cause symptoms. These may be different for everyone.  Eat small meals often instead of 3 large meals a day.  Eat your meals slowly, in a place where you are relaxed.  Limit fried foods.  Cook foods using methods other than frying.  Avoid drinking alcohol.  Avoid drinking large amounts of liquids with your meals.  Avoid bending over or lying down until 2-3 hours after eating. What foods are not recommended? These are some foods and drinks that may make your symptoms worse: Vegetables  Tomatoes. Tomato juice. Tomato and spaghetti sauce. Chili peppers. Onion and garlic. Horseradish. Fruits  Oranges, grapefruit, and lemon (fruit and juice). Meats  High-fat meats, fish, and poultry. This includes hot dogs, ribs, ham, sausage, salami, and bacon. Dairy  Whole milk and chocolate milk. Sour cream. Cream. Butter. Ice cream. Cream cheese. Drinks  Coffee and tea. Bubbly (carbonated) drinks or energy drinks. Condiments  Hot sauce. Barbecue sauce. Sweets/Desserts  Chocolate and cocoa. Donuts. Peppermint and spearmint. Fats and Oils  High-fat foods. This includes [place] fries and potato chips. Other  Vinegar. Strong spices. This includes black pepper,  white pepper, red pepper, cayenne, curry powder, cloves, ginger, and chili powder. The items listed above may not be a complete list of foods and drinks to avoid. Contact your dietitian for more information.  This information is not intended to replace advice given to you by your health care provider. Make sure you discuss any questions you have with your health care provider. Document Released: 08/22/2011 Document Revised: 07/29/2015 Document Reviewed: 12/25/2012 Elsevier Interactive Patient Education  2017 [employer].

## 2016-05-15 NOTE — Assessment & Plan Note (Signed)
Some swelling posteriorly and chronic pain worse upon arising

## 2016-05-15 NOTE — Assessment & Plan Note (Signed)
Well controlled, no changes to meds. Encouraged heart healthy diet such as the DASH diet and exercise as tolerated.  °

## 2016-05-15 NOTE — Assessment & Plan Note (Signed)
Tolerating statin, encouraged heart healthy diet, avoid trans fats, minimize simple carbs and saturated fats. Increase exercise as tolerated although she does question if it is contributing to knee pain

## 2016-05-15 NOTE — Progress Notes (Signed)
Pre visit review using our clinic review tool, if applicable. No additional management support is needed unless otherwise documented below in the visit note. 

## 2016-05-15 NOTE — Assessment & Plan Note (Signed)
Check vitamin D levels 

## 2016-05-15 NOTE — Assessment & Plan Note (Signed)
Check cmp today 

## 2016-05-15 NOTE — Assessment & Plan Note (Signed)
Check Echo and EKG today. Can try Mylanta prn for possible reflux

## 2016-05-15 NOTE — Progress Notes (Signed)
Patient ID: Erika [name], female   DOB: 10/28/1945, 71 y.o.   MRN: [id number]   Subjective:    Patient ID: Erika [name], female    DOB: 1946/01/23, 72 y.o.   MRN: [id number]  Chief Complaint  Patient presents with  . Follow-up    3 month    HPI Patient is in today for follow up on HTN, hyperlipidemia, low Vitamin D. She notes a few weeks back she was having intermittent episodes of atypical chest pain. She describes it as squeezing sensation that lasted seconds always when she was up and active. She denies any associated symptoms such as shortness of breath, diaphoresis, nausea or palpitations with this occurred. She had no syncope or presyncope. No fatigue. She does care for her 53-year-old grandbaby and often notes this then. She also acknowledges a good bit of stress with her husband also having poor health issues. She also endorses bilateral knee pain with some swelling posteriorly in both knees at times. She endorses swelling in her ankles in the evenings lately. She questions if the atorvastatin is reviewed leading to the knee pain. Denies other ongoing pain concerns. No recent febrile illness or hospitalizations. Denies palp/SOB/HA/congestion/fevers or GU c/o. Taking meds as prescribed. She does endorse daily dyspepsia although the switch of the Ranitidine to qhs has helped some. Tums also helps  Past Medical History:  Diagnosis Date  . Allergic state 06/24/2012  . Anterior cervical lymphadenopathy 02/08/2013  . Arthritis 04/02/2011  . Bronchitis 04/02/2011  . Chicken pox as a child  . Contusion of leg, left 08/08/2012  . Diverticulosis of colon    colonoscopy 04/14/1999  . Dry mouth 03/13/2012  . Fatigue 08/02/2014  . GERD (gastroesophageal reflux disease)   . History of fibrocystic disease of breast   . Hyperlipidemia   . Hyperlipidemia, mixed 02/20/2007   Followed as Primary Care Patient/ [facility]/ [facility]  Goal LDL < 130 HBP/Pos fm hx      . Hypertension   . Hypokalemia  06/24/2012  . Hyponatremia 08/02/2014  . IBS (irritable bowel syndrome) 04/02/2011  . Knee pain, bilateral 05/15/2016  . Left shoulder strain 06/14/2011  . Measles as a child  . Mumps as a child  . Obesity   . Osteopenia   . Overactive bladder 04/02/2011  . Overweight   . Preventative health care 03/13/2012  . Shoulder pain, bilateral 06/14/2011    Past Surgical History:  Procedure Laterality Date  . BREAST REDUCTION SURGERY  1986  . BUNIONECTOMY Bilateral 1980  . HAMMER TOE SURGERY Right 12-03-13   foot  . MOUTH SURGERY  01-10-13   negative  . TONSILLECTOMY      Family History  Problem Relation Age of Onset  . Stroke Mother 25  . Hyperlipidemia Mother   . Hypertension Mother   . Heart attack Father 31  . Hypertension Father   . Heart disease Father   . Hypertension Brother   . Aneurysm Brother     aortic  . Hyperlipidemia Brother   . Heart disease Brother     aortic aneurysm rupture  . Multiple sclerosis Daughter   . Alcohol abuse Son   . Schizophrenia Son   . Bipolar disorder Son   . Mental illness Son     schizophrenic, bipolar    Social History   Social History  . Marital status: Married    Spouse name: N/A  . Number of children: 4  . Years of education: N/A   Occupational History  .  dental hygenist    Social History Main Topics  . Smoking status: Never Smoker  . Smokeless tobacco: Never Used  . Alcohol use No  . Drug use: No  . Sexual activity: Yes   Other Topics Concern  . Not on file   Social History Narrative   4 children 2 are adopted    Outpatient Medications Prior to Visit  Medication Sig Dispense Refill  . aspirin 81 MG tablet Take 81 mg by mouth daily.      Marland Kitchen atorvastatin (LIPITOR) 10 MG tablet Take 1 tablet (10 mg total) by mouth daily at 6 PM. 90 tablet 2  . calcium carbonate (TUMS) 500 MG chewable tablet as needed.      . cetirizine (ZYRTEC) 10 MG tablet Take 10 mg by mouth at bedtime as needed.      . cycloSPORINE (RESTASIS) 0.05 %  ophthalmic emulsion Place 1 drop into both eyes 2 (two) times daily.    . diphenhydramine-acetaminophen (TYLENOL PM) 25-500 MG TABS Take 1 tablet by mouth at bedtime as needed.      . Multiple Minerals-Vitamins (CITRACAL PLUS PO) Take 1 tablet by mouth daily.      Marland Kitchen omeprazole (PRILOSEC) 20 MG capsule TAKE ONE CAPSULE BY MOUTH EVERY DAY 90 capsule 2  . OVER THE COUNTER MEDICATION MegaRed- 1 daily    . ranitidine (ZANTAC) 300 MG tablet TAKE 1 TABLET (300 MG TOTAL) BY MOUTH AT BEDTIME. 30 tablet 3  . trospium (SANCTURA) 20 MG tablet Take 20 mg by mouth daily.      . valsartan (DIOVAN) 320 MG tablet Take 320 mg by mouth daily.    . Vitamin D, Ergocalciferol, (DRISDOL) 50000 units CAPS capsule Take 1 capsule (50,000 Units total) by mouth every 7 (seven) days. 4 capsule 4  . amLODipine (NORVASC) 10 MG tablet TAKE ONE TABLET BY MOUTH DAILY. 30 tablet 7  . metoprolol succinate (TOPROL-XL) 50 MG 24 hr tablet TAKE 1 TABLET BY MOUTH TWICE DAILY WITH OR IMMEDIATELY FOLLOWING A MEAL 60 tablet 1  . triamterene-hydrochlorothiazide (MAXZIDE-25) 37.5-25 MG tablet Take 1 tablet by mouth daily. 90 tablet 1   No facility-administered medications prior to visit.     No Known Allergies  Review of Systems  Constitutional: Positive for malaise/fatigue. Negative for fever.  HENT: Negative for congestion.   Eyes: Negative for blurred vision.  Respiratory: Negative for shortness of breath.   Cardiovascular: Positive for chest pain and leg swelling. Negative for palpitations.  Gastrointestinal: Positive for heartburn. Negative for abdominal pain, blood in stool and nausea.  Genitourinary: Negative for dysuria and frequency.  Musculoskeletal: Positive for joint pain. Negative for falls.  Skin: Negative for rash.  Neurological: Negative for dizziness, loss of consciousness and headaches.  Endo/Heme/Allergies: Negative for environmental allergies.  Psychiatric/Behavioral: Negative for depression. The patient is not  nervous/anxious.        Objective:    Physical Exam  Constitutional: She is oriented to person, place, and time. She appears well-developed and well-nourished. No distress.  HENT:  Head: Normocephalic and atraumatic.  Nose: Nose normal.  Eyes: Right eye exhibits no discharge. Left eye exhibits no discharge.  Neck: Normal range of motion. Neck supple.  Cardiovascular: Normal rate and regular rhythm.  Exam reveals gallop.   No murmur heard. Pulmonary/Chest: Effort normal and breath sounds normal.  Abdominal: Soft. Bowel sounds are normal. There is no tenderness.  Musculoskeletal: She exhibits no edema.  Neurological: She is alert and oriented to person, place, and time.  Skin: Skin is warm and dry.  Psychiatric: She has a normal mood and affect.  Nursing note and vitals reviewed.   BP 122/82 (BP Location: Left Arm, Patient Position: Sitting, Cuff Size: Normal)   Pulse (!) 59   Temp 98.1 F (36.7 C) (Oral)   Ht 5\' 5"  (1.651 m)   Wt 177 lb 4 oz (80.4 kg)   SpO2 97%   BMI 29.50 kg/m  Wt Readings from Last 3 Encounters:  05/15/16 177 lb 4 oz (80.4 kg)  01/04/16 176 lb 3.2 oz (79.9 kg)  09/08/15 179 lb 6.4 oz (81.4 kg)     Lab Results  Component Value Date   WBC 5.8 01/04/2016   HGB 12.0 01/04/2016   HCT 35.1 (L) 01/04/2016   PLT 308.0 01/04/2016   GLUCOSE 93 02/14/2016   CHOL 200 01/04/2016   TRIG 156.0 (H) 01/04/2016   HDL 46.60 01/04/2016   LDLDIRECT 163.0 07/24/2014   LDLCALC 122 (H) 01/04/2016   ALT 11 02/14/2016   AST 15 02/14/2016   NA 137 02/14/2016   K 4.2 02/14/2016   CL 101 02/14/2016   CREATININE 1.39 (H) 02/14/2016   BUN 17 02/14/2016   CO2 27 02/14/2016   TSH 0.77 01/04/2016    Lab Results  Component Value Date   TSH 0.77 01/04/2016   Lab Results  Component Value Date   WBC 5.8 01/04/2016   HGB 12.0 01/04/2016   HCT 35.1 (L) 01/04/2016   MCV 84.1 01/04/2016   PLT 308.0 01/04/2016   Lab Results  Component Value Date   NA 137  02/14/2016   K 4.2 02/14/2016   CO2 27 02/14/2016   GLUCOSE 93 02/14/2016   BUN 17 02/14/2016   CREATININE 1.39 (H) 02/14/2016   BILITOT 0.4 02/14/2016   ALKPHOS 62 02/14/2016   AST 15 02/14/2016   ALT 11 02/14/2016   PROT 6.5 02/14/2016   ALBUMIN 3.8 02/14/2016   CALCIUM 9.2 02/14/2016   GFR 61.82 01/04/2016   Lab Results  Component Value Date   CHOL 200 01/04/2016   Lab Results  Component Value Date   HDL 46.60 01/04/2016   Lab Results  Component Value Date   LDLCALC 122 (H) 01/04/2016   Lab Results  Component Value Date   TRIG 156.0 (H) 01/04/2016   Lab Results  Component Value Date   CHOLHDL 4 01/04/2016   No results found for: HGBA1C     Assessment & Plan:   Problem List Items Addressed This Visit    Hyperlipidemia, mixed    Tolerating statin, encouraged heart healthy diet, avoid trans fats, minimize simple carbs and saturated fats. Increase exercise as tolerated although she does question if it is contributing to knee pain      Relevant Medications   triamterene-hydrochlorothiazide (MAXZIDE-25) 37.5-25 MG tablet   metoprolol succinate (TOPROL-XL) 50 MG 24 hr tablet   amLODipine (NORVASC) 10 MG tablet   Other Relevant Orders   Lipid panel   Hypertension    Well controlled, no changes to meds. Encouraged heart healthy diet such as the DASH diet and exercise as tolerated.       Relevant Medications   triamterene-hydrochlorothiazide (MAXZIDE-25) 37.5-25 MG tablet   metoprolol succinate (TOPROL-XL) 50 MG 24 hr tablet   amLODipine (NORVASC) 10 MG tablet   Other Relevant Orders   CBC   Comprehensive metabolic panel   TSH   Hypokalemia    Check cmp today      Atypical chest pain  Check Echo and EKG today. Can try Mylanta prn for possible reflux      Low vitamin D level    Check vitamin D levels      Knee pain, bilateral    Some swelling posteriorly and chronic pain worse upon arising      Relevant Orders   DG Knee Complete 4 Views Right     DG Knee Complete 4 Views Left    Other Visit Diagnoses    Chest pain, unspecified type    -  Primary   Relevant Orders   ECHOCARDIOGRAM COMPLETE   EKG 12-Lead      I have changed Ms. Ragle's amLODipine. I am also having her maintain her aspirin, Multiple Minerals-Vitamins (CITRACAL PLUS PO), trospium, calcium carbonate, diphenhydramine-acetaminophen, cetirizine, cycloSPORINE, OVER THE COUNTER MEDICATION, ranitidine, Vitamin D (Ergocalciferol), atorvastatin, valsartan, omeprazole, triamterene-hydrochlorothiazide, and metoprolol succinate.  Meds ordered this encounter  Medications  . triamterene-hydrochlorothiazide (MAXZIDE-25) 37.5-25 MG tablet    Sig: Take 1 tablet by mouth daily.    Dispense:  30 tablet    Refill:  1  . metoprolol succinate (TOPROL-XL) 50 MG 24 hr tablet    Sig: TAKE 1 TABLET BY MOUTH TWICE DAILY WITH OR IMMEDIATELY FOLLOWING A MEAL    Dispense:  60 tablet    Refill:  1  . amLODipine (NORVASC) 10 MG tablet    Sig: Take 0.5 tablets (5 mg total) by mouth 2 (two) times daily.    Dispense:  30 tablet    Refill:  7     Penni Homans, MD

## 2016-05-16 LAB — TSH: TSH: 1.78 u[IU]/mL (ref 0.35–4.50)

## 2016-05-16 LAB — VITAMIN D 25 HYDROXY (VIT D DEFICIENCY, FRACTURES): VITD: 28.26 ng/mL — ABNORMAL LOW (ref 30.00–100.00)

## 2016-05-24 ENCOUNTER — Ambulatory Visit (INDEPENDENT_AMBULATORY_CARE_PROVIDER_SITE_OTHER): Payer: PPO | Admitting: Physician Assistant

## 2016-05-24 ENCOUNTER — Encounter (INDEPENDENT_AMBULATORY_CARE_PROVIDER_SITE_OTHER): Payer: Self-pay | Admitting: Physician Assistant

## 2016-05-24 DIAGNOSIS — M25561 Pain in right knee: Secondary | ICD-10-CM

## 2016-05-24 MED ORDER — METHYLPREDNISOLONE ACETATE 40 MG/ML IJ SUSP
40.0000 mg | INTRAMUSCULAR | Status: AC | PRN
  Administered 2016-05-24: 40 mg via INTRA_ARTICULAR

## 2016-05-24 MED ORDER — LIDOCAINE HCL 1 % IJ SOLN
3.0000 mL | INTRAMUSCULAR | Status: AC | PRN
  Administered 2016-05-24: 3 mL

## 2016-05-24 NOTE — Progress Notes (Signed)
Office Visit Note   Patient: Erika [name]           Date of Birth: Jun 06, 1945           MRN: [id number] Visit Date: 05/24/2016              Requested by: [name], MD [address] [place], [place] [address] PCP: Penni Homans, MD   Assessment & Plan: Visit Diagnoses:  1. Acute pain of right knee     Plan: We'll have her follow up in 2 weeks' to check progress lack of. If She develops any mechanical symptoms orher pain continues despite conservative treatment recommend MRI to rule out meniscal tear.Javier Docker strengthening exercises discussed with the patient.  Follow-Up Instructions: Return in about 2 weeks (around 06/07/2016).   Orders:  Orders Placed This Encounter  Procedures  . Large Joint Injection/Arthrocentesis   No orders of the defined types were placed in this encounter.     Procedures: Large Joint Inj Date/Time: 05/24/2016 4:16 PM Performed by: Pete Pelt Authorized by: Pete Pelt   Consent Given by:  Patient Indications:  Pain Location:  Knee Site:  R knee Needle Size:  22 G Approach:  Superolateral Ultrasound Guidance: No   Fluoroscopic Guidance: No   Medications:  40 mg methylPREDNISolone acetate 40 MG/ML; 3 mL lidocaine 1 % Aspiration Attempted: No   Aspirate amount (mL):  12 Aspirate:  Blood-tinged Patient tolerance:  Patient tolerated the procedure well with no immediate complications     Clinical Data: No additional findings.   Subjective: Chief Complaint  Patient presents with  . Right Knee - Pain    Patient presents with right knee pain. She states that she had acute pain in both knees about 1 1/2 weeks ago and then it settled in the right knee. She was unable to hardly bear weight on the leg and had swelling from the knee to the ankle. She was seen by her PCP who did x-rays. She told the patient that it was arthritis. Today, the pain is much better. The swelling has gone down and the pain that felt like a  toothache is gone. She does still have difficulty when going up or down the stairs.   She had a cortisone injection left knee back in October by Dr. Ninfa Linden in the left knee currently is doing well she has no pain. Regards the right knee she is having no mechanical symptoms at this point in time. She's had no particular injury to the knee Radiographs are reviewed on the current system dated 05/15/2016 right knee showed very minimal medial compartmental joint space narrowing. Overall good alignment. Otherwise the knee is well preserved.  Review of Systems See history of present illness  Objective: Vital Signs: There were no vitals taken for this visit.  Physical Exam  Constitutional: She is oriented to person, place, and time. She appears well-developed and well-nourished. No distress.  Neurological: She is alert and oriented to person, place, and time.  Psychiatric: She has a normal mood and affect.    Ortho Exam Bilateral knee she has full extension flexion of left knee. Right knee sitting up she is able to bring the leg to full extension but lying down she has difficulty writing in full extension. She has tenderness along medial lateral joint line of the right knee only. Slight effusion of the right knee. McMurray's positive on the right negative on the left. No instability valgus varus  sutures of either knee. Specialty Comments:  No specialty comments available.  Imaging: No results found.   PMFS History: Patient Active Problem List   Diagnosis Date Noted  . Knee pain, bilateral 05/15/2016  . Low vitamin D level 01/09/2016  . Hyponatremia 08/02/2014  . Fatigue 08/02/2014  . Cough 01/19/2014  . Nausea without vomiting 01/19/2014  . Atypical chest pain 01/19/2014  . Anterior cervical lymphadenopathy 02/08/2013  . Allergic state 06/24/2012  . Hypokalemia 06/24/2012  . Dry mouth 03/13/2012  . Preventative health care 03/13/2012  . Shoulder pain, bilateral 06/14/2011  .  Arthritis 04/02/2011  . IBS (irritable bowel syndrome) 04/02/2011  . Overactive bladder 04/02/2011  . Hypertension   . Diverticulosis of colon   . Overweight   . Osteopenia   . History of fibrocystic disease of breast   . CHRONIC RHINITIS 01/13/2009  . Hyperlipidemia, mixed 02/20/2007  . GERD 02/20/2007   Past Medical History:  Diagnosis Date  . Allergic state 06/24/2012  . Anterior cervical lymphadenopathy 02/08/2013  . Arthritis 04/02/2011  . Bronchitis 04/02/2011  . Chicken pox as a child  . Contusion of leg, left 08/08/2012  . Diverticulosis of colon    colonoscopy 04/14/1999  . Dry mouth 03/13/2012  . Fatigue 08/02/2014  . GERD (gastroesophageal reflux disease)   . History of fibrocystic disease of breast   . Hyperlipidemia   . Hyperlipidemia, mixed 02/20/2007   Followed as Primary Care Patient/ [facility]/ [facility]  Goal LDL < 130 HBP/Pos fm hx      . Hypertension   . Hypokalemia 06/24/2012  . Hyponatremia 08/02/2014  . IBS (irritable bowel syndrome) 04/02/2011  . Knee pain, bilateral 05/15/2016  . Left shoulder strain 06/14/2011  . Measles as a child  . Mumps as a child  . Obesity   . Osteopenia   . Overactive bladder 04/02/2011  . Overweight   . Preventative health care 03/13/2012  . Shoulder pain, bilateral 06/14/2011    Family History  Problem Relation Age of Onset  . Stroke Mother 8  . Hyperlipidemia Mother   . Hypertension Mother   . Heart attack Father 89  . Hypertension Father   . Heart disease Father   . Hypertension Brother   . Aneurysm Brother     aortic  . Hyperlipidemia Brother   . Heart disease Brother     aortic aneurysm rupture  . Multiple sclerosis Daughter   . Alcohol abuse Son   . Schizophrenia Son   . Bipolar disorder Son   . Mental illness Son     schizophrenic, bipolar    Past Surgical History:  Procedure Laterality Date  . BREAST REDUCTION SURGERY  1986  . BUNIONECTOMY Bilateral 1980  . HAMMER TOE SURGERY Right 12-03-13   foot  .  MOUTH SURGERY  01-10-13   negative  . TONSILLECTOMY     Social History   Occupational History  . dental hygenist    Social History Main Topics  . Smoking status: Never Smoker  . Smokeless tobacco: Never Used  . Alcohol use No  . Drug use: No  . Sexual activity: Yes

## 2016-06-07 ENCOUNTER — Ambulatory Visit (INDEPENDENT_AMBULATORY_CARE_PROVIDER_SITE_OTHER): Payer: PPO | Admitting: Physician Assistant

## 2016-06-08 ENCOUNTER — Other Ambulatory Visit (HOSPITAL_BASED_OUTPATIENT_CLINIC_OR_DEPARTMENT_OTHER): Payer: PPO

## 2016-06-15 ENCOUNTER — Ambulatory Visit (HOSPITAL_BASED_OUTPATIENT_CLINIC_OR_DEPARTMENT_OTHER)
Admission: RE | Admit: 2016-06-15 | Discharge: 2016-06-15 | Disposition: A | Discharge status: Home / Self Care | Payer: PPO | Source: Ambulatory Visit | Attending: Family Medicine | Admitting: Family Medicine

## 2016-06-15 DIAGNOSIS — R0789 Other chest pain: Secondary | ICD-10-CM | POA: Diagnosis not present

## 2016-06-15 DIAGNOSIS — I340 Nonrheumatic mitral (valve) insufficiency: Secondary | ICD-10-CM | POA: Insufficient documentation

## 2016-06-15 DIAGNOSIS — R079 Chest pain, unspecified: Secondary | ICD-10-CM

## 2016-06-15 DIAGNOSIS — I119 Hypertensive heart disease without heart failure: Secondary | ICD-10-CM | POA: Insufficient documentation

## 2016-06-15 NOTE — Progress Notes (Signed)
  Echocardiogram 2D Echocardiogram has been performed.  Tresa Res 06/15/2016, 9:04 AM

## 2016-07-11 ENCOUNTER — Other Ambulatory Visit: Payer: Self-pay | Admitting: Family Medicine

## 2016-07-11 MED ORDER — METOPROLOL SUCCINATE ER 50 MG PO TB24: ORAL_TABLET | ORAL | 1 refills | Status: DC

## 2016-07-19 ENCOUNTER — Other Ambulatory Visit: Payer: Self-pay | Admitting: Family Medicine

## 2016-08-04 ENCOUNTER — Other Ambulatory Visit: Payer: Self-pay | Admitting: Family Medicine

## 2016-08-04 NOTE — Progress Notes (Signed)
Subjective:   Erika [name] is a 71 y.o. female who presents for Medicare Annual (Subsequent) preventive examination.  She notes increased daytime sleepiness since getting her blood pressure under control and wonders if she might be 'over medicated.' She checks BP at home and notes systolic averages 073X-106Y and diastolic averages 69S.   She also notes "excruciating" R arm pain that lasts for 10-30 seconds once/week that resolves on it's own x1-2 years. She has discussed this w/ PCP before, who thought it could be cardiac related, so echo was completed. Cardiology referral was recommended, but patient was not ready for referral due to financial constraints at the time. Today, she reports she is ready for cardiology referral.   She has vitamin D deficiency, previously rx'd 50000 IU weekly, but has not been taking this.   She stays active being involved with her 5 grandchildren who lives nearby. She keeps her 59 y/o grandchild daily, which she really enjoys.   Review of Systems:  No ROS.  Medicare Wellness Visit.  Cardiac Risk Factors include: advanced age (>22men, >59 women);dyslipidemia;obesity (BMI >30kg/m2);hypertension  Sleep patterns: has daytime sleepiness. Goes to bed at 9pm and sleeps through the night. Takes a 20-30 minute daytime nap each day. Home Safety/Smoke Alarms: Feels safe in home. Smoke alarms in place.    Living environment; residence and Firearm Safety: Lives w/ husband in split level home. Daughters live on the same street. number of inside stairs: 5-6, tub-shower, firearms stored safely.   Counseling:   Eye Exam- Follows w/ [place] w/ Dr. Renelda Mom every 6 months  Female:   Pap- Aged out.       Mammo- last 09/01/15. BI-RADS CATEGORY  2: Benign. Dexa scan- Not on file. CCS- last 05/12/09. Moderate diverticulosis, otherwise normal. 10 year recall.     Objective:     Vitals: BP (!) 118/58   Pulse 60   Ht 5\' 4"  (1.626 m)   Wt 176 lb  (79.8 kg)   SpO2 98%   BMI 30.21 kg/m   Body mass index is 30.21 kg/m.  Wt Readings from Last 3 Encounters:  08/07/16 176 lb (79.8 kg)  05/15/16 177 lb 4 oz (80.4 kg)  01/04/16 176 lb 3.2 oz (79.9 kg)   BP Readings from Last 3 Encounters:  08/07/16 (!) 118/58  05/15/16 122/82  03/14/16 (!) 128/51   Tobacco History  Smoking Status  . Never Smoker  Smokeless Tobacco  . Never Used     Counseling given: Not Answered   Past Medical History:  Diagnosis Date  . Allergic state 06/24/2012  . Anterior cervical lymphadenopathy 02/08/2013  . Arthritis 04/02/2011  . Bronchitis 04/02/2011  . Chicken pox as a child  . Contusion of leg, left 08/08/2012  . Diverticulosis of colon    colonoscopy 04/14/1999  . Dry mouth 03/13/2012  . Fatigue 08/02/2014  . GERD (gastroesophageal reflux disease)   . History of fibrocystic disease of breast   . Hyperlipidemia   . Hyperlipidemia, mixed 02/20/2007   Followed as Primary Care Patient/ [facility]/ [place]  Goal LDL < 130 HBP/Pos fm hx      . Hypertension   . Hypokalemia 06/24/2012  . Hyponatremia 08/02/2014  . IBS (irritable bowel syndrome) 04/02/2011  . Knee pain, bilateral 05/15/2016  . Left shoulder strain 06/14/2011  . Measles as a child  . Mumps as a child  . Obesity   . Osteopenia   . Overactive bladder 04/02/2011  . Overweight   .  Preventative health care 03/13/2012  . Shoulder pain, bilateral 06/14/2011   Past Surgical History:  Procedure Laterality Date  . BREAST REDUCTION SURGERY  1986  . BUNIONECTOMY Bilateral 1980  . CATARACT EXTRACTION, BILATERAL    . HAMMER TOE SURGERY Right 12-03-13   foot  . MOUTH SURGERY  01-10-13   negative  . TONSILLECTOMY     Family History  Problem Relation Age of Onset  . Stroke Mother 93  . Hyperlipidemia Mother   . Hypertension Mother   . Heart attack Father 82  . Hypertension Father   . Heart disease Father   . Hypertension Brother   . Aneurysm Brother        aortic  . Hyperlipidemia  Brother   . Heart disease Brother        aortic aneurysm rupture  . Multiple sclerosis Daughter   . Alcohol abuse Son   . Schizophrenia Son   . Bipolar disorder Son   . Mental illness Son        schizophrenic, bipolar   History  Sexual Activity  . Sexual activity: Not Currently    Outpatient Encounter Prescriptions as of 08/07/2016  Medication Sig  . amLODipine (NORVASC) 10 MG tablet Take 0.5 tablets (5 mg total) by mouth 2 (two) times daily.  Marland Kitchen aspirin 81 MG tablet Take 81 mg by mouth daily.    Marland Kitchen atorvastatin (LIPITOR) 10 MG tablet Take 1 tablet (10 mg total) by mouth daily at 6 PM.  . calcium carbonate (TUMS) 500 MG chewable tablet as needed.    . cetirizine (ZYRTEC) 10 MG tablet Take 10 mg by mouth at bedtime as needed.    . Cholecalciferol (VITAMIN D PO) Take 1 capsule by mouth daily.  . cycloSPORINE (RESTASIS) 0.05 % ophthalmic emulsion Place 1 drop into both eyes 2 (two) times daily.  . diphenhydramine-acetaminophen (TYLENOL PM) 25-500 MG TABS Take 1 tablet by mouth at bedtime as needed.    . metoprolol succinate (TOPROL-XL) 50 MG 24 hr tablet TAKE 1 TABLET BY MOUTH TWICE DAILY WITH OR IMMEDIATELY FOLLOWING A MEAL  . Multiple Minerals-Vitamins (CITRACAL PLUS PO) Take 1 tablet by mouth daily.    Marland Kitchen omeprazole (PRILOSEC) 20 MG capsule TAKE ONE CAPSULE BY MOUTH EVERY DAY  . OVER THE COUNTER MEDICATION MegaRed- 1 daily  . ranitidine (ZANTAC) 300 MG tablet TAKE 1 TABLET (300 MG TOTAL) BY MOUTH AT BEDTIME.  Marland Kitchen triamterene-hydrochlorothiazide (MAXZIDE-25) 37.5-25 MG tablet TAKE 1 TABLET BY MOUTH DAILY.  . trospium (SANCTURA) 20 MG tablet Take 20 mg by mouth daily.    . valsartan (DIOVAN) 320 MG tablet TAKE 1 TABLET BY MOUTH EVERY DAY  . Vitamin D, Ergocalciferol, (DRISDOL) 50000 units CAPS capsule Take 1 capsule (50,000 Units total) by mouth every 7 (seven) days.  . [DISCONTINUED] BESIVANCE 0.6 % SUSP PLACE 1 DROP INTO RIGHT EYE 3 TIMES A DAY  . [DISCONTINUED] valsartan (DIOVAN) 320 MG  tablet Take 320 mg by mouth daily.  . [DISCONTINUED] Vitamin D, Ergocalciferol, (DRISDOL) 50000 units CAPS capsule Take 1 capsule (50,000 Units total) by mouth every 7 (seven) days. (Patient not taking: Reported on 08/07/2016)   No facility-administered encounter medications on file as of 08/07/2016.     Activities of Daily Living In your present state of health, do you have any difficulty performing the following activities: 08/07/2016  Hearing? N  Vision? N  Difficulty concentrating or making decisions? N  Walking or climbing stairs? N  Dressing or bathing? N  Doing errands,  shopping? N  Preparing Food and eating ? N  Using the Toilet? N  Do you have problems with loss of bowel control? N  Managing your Medications? N  Managing your Finances? N  Housekeeping or managing your Housekeeping? N  Some recent data might be hidden    Patient Care Team: [name], MD as PCP - General (Family Medicine) Molli Posey, MD as Consulting Physician (Obstetrics and Gynecology) Marica Otter, [place] as Consulting Physician (Optometry) Mcarthur Rossetti, MD as Consulting Physician (Orthopedic Surgery) Renelda Mom, DDS as Consulting Physician (Dentistry)    Assessment:    Physical assessment deferred to PCP.  Exercise Activities and Dietary recommendations Current Exercise Habits: Structured exercise class;Home exercise routine, Type of exercise: walking;Other - see comments Psychologist, educational activities at [facility]), Intensity: Mild  Diet (meal preparation, eat out, water intake, caffeinated beverages, dairy products, fruits and vegetables): in general, a "healthy" diet  , well balanced, on average, 3 meals per day. Eats salad every day, has fruit for dessert. Likes water and sweet tea w/ meals. Husband has been trying to gain weight lately, so they have been eating more fatty foods/fast food than normal.  Breakfast: Fiber cereal w/ low fat milk     Goals    . Weight (lb) < 170 lb (77.1 kg)       Fall Risk Fall Risk  08/07/2016 07/24/2014 02/08/2013 02/06/2013  Falls in the past year? No No Yes No  Number falls in past yr: - - 1 -  Injury with Fall? - - Yes -   Depression Screen PHQ 2/9 Scores 08/07/2016 07/24/2014 02/08/2013 02/06/2013  PHQ - 2 Score 1 0 0 0     Cognitive Function MMSE - Mini Mental State Exam 08/07/2016  Orientation to time 5  Orientation to Place 5  Registration 3  Attention/ Calculation 5  Recall 3  Language- name 2 objects 2  Language- repeat 1  Language- follow 3 step command 3  Language- read & follow direction 1  Write a sentence 1  Copy design 1  Total score 30        Immunization History  Administered Date(s) Administered  . Influenza Whole 12/05/2010, 12/04/2012  . Influenza,inj,Quad PF,36+ Mos 01/12/2014  . Influenza-Unspecified 12/30/2014  . Pneumococcal Conjugate-13 01/13/2014  . Pneumococcal Polysaccharide-23 07/22/2008, 12/05/2010  . Td 03/06/2010  . Zoster 02/17/2014   Screening Tests Health Maintenance  Topic Date Due  . DEXA SCAN  01/13/2011  . MAMMOGRAM  11/05/2014  . PNA vac Low Risk Adult (2 of 2 - PPSV23) 12/05/2015  . INFLUENZA VACCINE  10/04/2016  . COLONOSCOPY  05/13/2019  . TETANUS/TDAP  03/06/2020  . Hepatitis C Screening  Completed      Plan:    Follow-up w/ PCP as scheduled. Fatigue discussed w/ PCP, recommended proceeding w/ cardiology referral, continue checking BP at home, and follow-up sooner if needed.  MMG and DEXA to be completed w/ Gyn.  Bring a copy of your advance directives to your next office visit.  Cardiology referral placed per echo result notes.   Vitamin D rx sent to pharmacy per results notes.   Shingrix discussed, patient to consider and will receive at pharmacy if she chooses.  Patient thinks she received pneumococcal vaccines at [facility] or [facility]. Called pharmacy, they do not have records of pneumo vaccine. Called [facility] call center-received Prevnar 13 on 01/13/2014.  Last pneumovax was in 2012. Immunization history updated.  I have personally reviewed and noted the  following in the patient's chart:   . Medical and social history . Use of alcohol, tobacco or illicit drugs  . Current medications and supplements . Functional ability and status . Nutritional status . Physical activity . Advanced directives . List of other physicians . Vitals . Screenings to include cognitive, depression, and falls . Referrals and appointments  In addition, I have reviewed and discussed with patient certain preventive protocols, quality metrics, and best practice recommendations. A written personalized care plan for preventive services as well as general preventive health recommendations were provided to patient.     Dorrene German, RN  08/07/2016

## 2016-08-07 ENCOUNTER — Ambulatory Visit (INDEPENDENT_AMBULATORY_CARE_PROVIDER_SITE_OTHER): Payer: PPO | Admitting: *Deleted

## 2016-08-07 ENCOUNTER — Encounter: Payer: Self-pay | Admitting: *Deleted

## 2016-08-07 VITALS — BP 118/58 | HR 60 | Ht 64.0 in | Wt 176.0 lb

## 2016-08-07 DIAGNOSIS — I519 Heart disease, unspecified: Secondary | ICD-10-CM | POA: Diagnosis not present

## 2016-08-07 DIAGNOSIS — I5189 Other ill-defined heart diseases: Secondary | ICD-10-CM

## 2016-08-07 DIAGNOSIS — R079 Chest pain, unspecified: Secondary | ICD-10-CM | POA: Diagnosis not present

## 2016-08-07 DIAGNOSIS — Z0000 Encounter for general adult medical examination without abnormal findings: Secondary | ICD-10-CM | POA: Diagnosis not present

## 2016-08-07 MED ORDER — VITAMIN D (ERGOCALCIFEROL) 1.25 MG (50000 UNIT) PO CAPS: 50000.0000 [IU] | ORAL_CAPSULE | ORAL | 4 refills | Status: DC

## 2016-08-07 NOTE — Patient Instructions (Addendum)
Erika [name] , Thank you for taking time to come for your Medicare Wellness Visit. I appreciate your ongoing commitment to your health goals. Please review the following plan we discussed and let me know if I can assist you in the future.   Bring a copy of your advance directives to your next office visit.  We will call you, usually within 5 business days, regarding your cardiology referral.   Do follow-up w/ Dr. Matthew Saras regarding your mammogram and bone density. Have him send [place] a copy of the results once they are completed.   These are the goals we discussed: Goals    . Weight (lb) < 170 lb (77.1 kg)       This is a list of the screening recommended for you and due dates:  Health Maintenance  Topic Date Due  . DEXA scan (bone density measurement)  01/13/2011  . Pneumonia vaccines (1 of 2 - PCV13) 12/05/2011  . Mammogram  11/05/2014  . Flu Shot  10/04/2016  . Colon Cancer Screening  05/13/2019  . Tetanus Vaccine  03/06/2020  .  Hepatitis C: One time screening is recommended by Center for Disease Control  (CDC) for  adults born from [birth year] through 1965.   Completed   Health Maintenance, Female Adopting a healthy lifestyle and getting preventive care can go a long way to promote health and wellness. Talk with your health care provider about what schedule of regular examinations is right for you. This is a good chance for you to check in with your provider about disease prevention and staying healthy. In between checkups, there are plenty of things you can do on your own. Experts have done a lot of research about which lifestyle changes and preventive measures are most likely to keep you healthy. Ask your health care provider for more information. Weight and diet Eat a healthy diet  Be sure to include plenty of vegetables, fruits, low-fat dairy products, and lean protein.  Do not eat a lot of foods high in solid fats, added sugars, or salt.  Get regular exercise. This is one of the  most important things you can do for your health. ? Most adults should exercise for at least 150 minutes each week. The exercise should increase your heart rate and make you sweat (moderate-intensity exercise). ? Most adults should also do strengthening exercises at least twice a week. This is in addition to the moderate-intensity exercise.  Maintain a healthy weight  Body mass index (BMI) is a measurement that can be used to identify possible weight problems. It estimates body fat based on height and weight. Your health care provider can help determine your BMI and help you achieve or maintain a healthy weight.  For females 62 years of age and older: ? A BMI below 18.5 is considered underweight. ? A BMI of 18.5 to 24.9 is normal. ? A BMI of 25 to 29.9 is considered overweight. ? A BMI of 30 and above is considered obese.  Watch levels of cholesterol and blood lipids  You should start having your blood tested for lipids and cholesterol at 71 years of age, then have this test every 5 years.  You may need to have your cholesterol levels checked more often if: ? Your lipid or cholesterol levels are high. ? You are older than 71 years of age. ? You are at high risk for heart disease.  Cancer screening Lung Cancer  Lung cancer screening is recommended for adults 53-78 years old  who are at high risk for lung cancer because of a history of smoking.  A yearly low-dose CT scan of the lungs is recommended for people who: ? Currently smoke. ? Have quit within the past 15 years. ? Have at least a 30-pack-year history of smoking. A pack year is smoking an average of one pack of cigarettes a day for 1 year.  Yearly screening should continue until it has been 15 years since you quit.  Yearly screening should stop if you develop a health problem that would prevent you from having lung cancer treatment.  Breast Cancer  Practice breast self-awareness. This means understanding how your breasts  normally appear and feel.  It also means doing regular breast self-exams. Let your health care provider know about any changes, no matter how small.  If you are in your 20s or 30s, you should have a clinical breast exam (CBE) by a health care provider every 1-3 years as part of a regular health exam.  If you are 41 or older, have a CBE every year. Also consider having a breast X-ray (mammogram) every year.  If you have a family history of breast cancer, talk to your health care provider about genetic screening.  If you are at high risk for breast cancer, talk to your health care provider about having an MRI and a mammogram every year.  Breast cancer gene (BRCA) assessment is recommended for women who have family members with BRCA-related cancers. BRCA-related cancers include: ? Breast. ? Ovarian. ? Tubal. ? Peritoneal cancers.  Results of the assessment will determine the need for genetic counseling and BRCA1 and BRCA2 testing.  Cervical Cancer Your health care provider may recommend that you be screened regularly for cancer of the pelvic organs (ovaries, uterus, and vagina). This screening involves a pelvic examination, including checking for microscopic changes to the surface of your cervix (Pap test). You may be encouraged to have this screening done every 3 years, beginning at age 24.  For women ages 67-65, health care providers may recommend pelvic exams and Pap testing every 3 years, or they may recommend the Pap and pelvic exam, combined with testing for human papilloma virus (HPV), every 5 years. Some types of HPV increase your risk of cervical cancer. Testing for HPV may also be done on women of any age with unclear Pap test results.  Other health care providers may not recommend any screening for nonpregnant women who are considered low risk for pelvic cancer and who do not have symptoms. Ask your health care provider if a screening pelvic exam is right for you.  If you have had  past treatment for cervical cancer or a condition that could lead to cancer, you need Pap tests and screening for cancer for at least 20 years after your treatment. If Pap tests have been discontinued, your risk factors (such as having a new sexual partner) need to be reassessed to determine if screening should resume. Some women have medical problems that increase the chance of getting cervical cancer. In these cases, your health care provider may recommend more frequent screening and Pap tests.  Colorectal Cancer  This type of cancer can be detected and often prevented.  Routine colorectal cancer screening usually begins at 71 years of age and continues through 70 years of age.  Your health care provider may recommend screening at an earlier age if you have risk factors for colon cancer.  Your health care provider may also recommend using home test kits  to check for hidden blood in the stool.  A small camera at the end of a tube can be used to examine your colon directly (sigmoidoscopy or colonoscopy). This is done to check for the earliest forms of colorectal cancer.  Routine screening usually begins at age 56.  Direct examination of the colon should be repeated every 5-10 years through 71 years of age. However, you may need to be screened more often if early forms of precancerous polyps or small growths are found.  Skin Cancer  Check your skin from head to toe regularly.  Tell your health care provider about any new moles or changes in moles, especially if there is a change in a mole's shape or color.  Also tell your health care provider if you have a mole that is larger than the size of a pencil eraser.  Always use sunscreen. Apply sunscreen liberally and repeatedly throughout the day.  Protect yourself by wearing long sleeves, pants, a wide-brimmed hat, and sunglasses whenever you are outside.  Heart disease, diabetes, and high blood pressure  High blood pressure causes heart  disease and increases the risk of stroke. High blood pressure is more likely to develop in: ? People who have blood pressure in the high end of the normal range (130-139/85-89 mm Hg). ? People who are overweight or obese. ? People who are African American.  If you are 9-50 years of age, have your blood pressure checked every 3-5 years. If you are 3 years of age or older, have your blood pressure checked every year. You should have your blood pressure measured twice-once when you are at a hospital or clinic, and once when you are not at a hospital or clinic. Record the average of the two measurements. To check your blood pressure when you are not at a hospital or clinic, you can use: ? An automated blood pressure machine at a pharmacy. ? A home blood pressure monitor.  If you are between 56 years and 20 years old, ask your health care provider if you should take aspirin to prevent strokes.  Have regular diabetes screenings. This involves taking a blood sample to check your fasting blood sugar level. ? If you are at a normal weight and have a low risk for diabetes, have this test once every three years after 71 years of age. ? If you are overweight and have a high risk for diabetes, consider being tested at a younger age or more often. Preventing infection Hepatitis B  If you have a higher risk for hepatitis B, you should be screened for this virus. You are considered at high risk for hepatitis B if: ? You were born in a country where hepatitis B is common. Ask your health care provider which countries are considered high risk. ? Your parents were born in a high-risk country, and you have not been immunized against hepatitis B (hepatitis B vaccine). ? You have HIV or AIDS. ? You use needles to inject street drugs. ? You live with someone who has hepatitis B. ? You have had sex with someone who has hepatitis B. ? You get hemodialysis treatment. ? You take certain medicines for conditions,  including cancer, organ transplantation, and autoimmune conditions.  Hepatitis C  Blood testing is recommended for: ? Everyone born from [birth year] through 1965. ? Anyone with known risk factors for hepatitis C.  Sexually transmitted infections (STIs)  You should be screened for sexually transmitted infections (STIs) including gonorrhea and chlamydia if: ?  You are sexually active and are younger than 71 years of age. ? You are older than 71 years of age and your health care provider tells you that you are at risk for this type of infection. ? Your sexual activity has changed since you were last screened and you are at an increased risk for chlamydia or gonorrhea. Ask your health care provider if you are at risk.  If you do not have HIV, but are at risk, it may be recommended that you take a prescription medicine daily to prevent HIV infection. This is called pre-exposure prophylaxis (PrEP). You are considered at risk if: ? You are sexually active and do not regularly use condoms or know the HIV status of your partner(s). ? You take drugs by injection. ? You are sexually active with a partner who has HIV.  Talk with your health care provider about whether you are at high risk of being infected with HIV. If you choose to begin PrEP, you should first be tested for HIV. You should then be tested every 3 months for as long as you are taking PrEP. Pregnancy  If you are premenopausal and you may become pregnant, ask your health care provider about preconception counseling.  If you may become pregnant, take 400 to 800 micrograms (mcg) of folic acid every day.  If you want to prevent pregnancy, talk to your health care provider about birth control (contraception). Osteoporosis and menopause  Osteoporosis is a disease in which the bones lose minerals and strength with aging. This can result in serious bone fractures. Your risk for osteoporosis can be identified using a bone density scan.  If you are  51 years of age or older, or if you are at risk for osteoporosis and fractures, ask your health care provider if you should be screened.  Ask your health care provider whether you should take a calcium or vitamin D supplement to lower your risk for osteoporosis.  Menopause may have certain physical symptoms and risks.  Hormone replacement therapy may reduce some of these symptoms and risks. Talk to your health care provider about whether hormone replacement therapy is right for you. Follow these instructions at home:  Schedule regular health, dental, and eye exams.  Stay current with your immunizations.  Do not use any tobacco products including cigarettes, chewing tobacco, or electronic cigarettes.  If you are pregnant, do not drink alcohol.  If you are breastfeeding, limit how much and how often you drink alcohol.  Limit alcohol intake to no more than 1 drink per day for nonpregnant women. One drink equals 12 ounces of beer, 5 ounces of wine, or 1 ounces of hard liquor.  Do not use street drugs.  Do not share needles.  Ask your health care provider for help if you need support or information about quitting drugs.  Tell your health care provider if you often feel depressed.  Tell your health care provider if you have ever been abused or do not feel safe at home. This information is not intended to replace advice given to you by your health care provider. Make sure you discuss any questions you have with your health care provider. Document Released: 09/05/2010 Document Revised: 07/29/2015 Document Reviewed: 11/24/2014 Elsevier Interactive Patient Education  [employer].

## 2016-09-07 NOTE — Progress Notes (Signed)
Erika Shelling, MD Reason for referral-Chest pain  HPI: 71 year old female for evaluation of chest pain at the request of Penni Homans M.D. Echocardiogram April 2018 showed normal LV systolic function, grade 1 diastolic dysfunction, mild left ventricular hypertrophy, moderate left atrial enlargement and mild tricuspid regurgitation. Patient states she has had intermittent chest pain for approximately 20 years. It is substernal without radiation. It is described as a quick stabbing pain for 1-2 seconds. It is not related to exertion. It is not pleuritic or related to food. Resolves spontaneously. No associated symptoms. She denies exertional chest pain. No dyspnea on exertion, orthopnea, PND, pedal edema or syncope. Because of the above we were asked to evaluate.  Current Outpatient Prescriptions  Medication Sig Dispense Refill  . amLODipine (NORVASC) 10 MG tablet Take 0.5 tablets (5 mg total) by mouth 2 (two) times daily. 30 tablet 7  . aspirin 81 MG tablet Take 81 mg by mouth daily.      Marland Kitchen atorvastatin (LIPITOR) 10 MG tablet Take 1 tablet (10 mg total) by mouth daily at 6 PM. 90 tablet 2  . calcium carbonate (TUMS) 500 MG chewable tablet Chew 1 tablet by mouth as needed.     . cetirizine (ZYRTEC) 10 MG tablet Take 10 mg by mouth at bedtime as needed.      . Cholecalciferol (VITAMIN D PO) Take 1 capsule by mouth daily.    . cycloSPORINE (RESTASIS) 0.05 % ophthalmic emulsion Place 1 drop into both eyes 2 (two) times daily.    . diphenhydramine-acetaminophen (TYLENOL PM) 25-500 MG TABS Take 1 tablet by mouth at bedtime as needed.      . metoprolol succinate (TOPROL-XL) 50 MG 24 hr tablet TAKE 1 TABLET BY MOUTH TWICE DAILY WITH OR IMMEDIATELY FOLLOWING A MEAL 180 tablet 1  . Multiple Minerals-Vitamins (CITRACAL PLUS PO) Take 1 tablet by mouth daily.      Marland Kitchen omeprazole (PRILOSEC) 20 MG capsule TAKE ONE CAPSULE BY MOUTH EVERY DAY 90 capsule 2  . ranitidine (ZANTAC) 300 MG tablet TAKE 1  TABLET (300 MG TOTAL) BY MOUTH AT BEDTIME. 30 tablet 3  . triamterene-hydrochlorothiazide (MAXZIDE-25) 37.5-25 MG tablet TAKE 1 TABLET BY MOUTH DAILY. 30 tablet 1  . trospium (SANCTURA) 20 MG tablet Take 20 mg by mouth daily.      . valsartan (DIOVAN) 320 MG tablet TAKE 1 TABLET BY MOUTH EVERY DAY 30 tablet 5  . Vitamin D, Ergocalciferol, (DRISDOL) 50000 units CAPS capsule Take 1 capsule (50,000 Units total) by mouth every 7 (seven) days. 4 capsule 4   No current facility-administered medications for this visit.     No Known Allergies   Past Medical History:  Diagnosis Date  . Allergic state 06/24/2012  . Anterior cervical lymphadenopathy 02/08/2013  . Arthritis 04/02/2011  . Bronchitis 04/02/2011  . Chicken pox as a child  . Diverticulosis of colon    colonoscopy 04/14/1999  . GERD (gastroesophageal reflux disease)   . History of fibrocystic disease of breast   . Hyperlipidemia, mixed 02/20/2007   Followed as Primary Care Patient/ [facility]/ [place]  Goal LDL < 130 HBP/Pos fm hx      . Hypertension   . Hypokalemia 06/24/2012  . Hyponatremia 08/02/2014  . IBS (irritable bowel syndrome) 04/02/2011  . Measles as a child  . Mumps as a child  . Obesity   . Osteopenia   . Overactive bladder 04/02/2011  . Overweight   . Preventative health care 03/13/2012  . Shoulder pain,  bilateral 06/14/2011    Past Surgical History:  Procedure Laterality Date  . BREAST REDUCTION SURGERY  1986  . BUNIONECTOMY Bilateral 1980  . CATARACT EXTRACTION, BILATERAL    . HAMMER TOE SURGERY Right 12-03-13   foot  . MOUTH SURGERY  01-10-13   negative  . TONSILLECTOMY      Social History   Social History  . Marital status: Married    Spouse name: N/A  . Number of children: 4  . 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 Smoking status: Never Smoker  . Smokeless tobacco: Never Used  . Alcohol use No  . Drug use: No  . Sexual activity:  Not Currently   Other Topics Concern  . Not on file   Social History Narrative   4 children 2 are adopted    Family History  Problem Relation Age of Onset  . Stroke Mother 31  . Hyperlipidemia Mother   . Hypertension Mother   . Heart attack Father 79  . Hypertension Father   . Heart disease Father   . Hypertension Brother   . Aneurysm Brother        aortic  . Hyperlipidemia Brother   . Heart disease Brother        aortic aneurysm rupture  . Multiple sclerosis Daughter   . Alcohol abuse Son   . Schizophrenia Son   . Bipolar disorder Son   . Mental illness Son        schizophrenic, bipolar    ROS: no fevers or chills, productive cough, hemoptysis, dysphasia, odynophagia, melena, hematochezia, dysuria, hematuria, rash, seizure activity, orthopnea, PND, pedal edema, claudication. Remaining systems are negative.  Physical Exam:   Blood pressure (!) 158/72, pulse (!) 50, height 5\' 5"  (1.651 m), weight 80.3 kg (177 lb).  General:  Well developed/well nourished in NAD Skin warm/dry Patient not depressed No peripheral clubbing Back-normal HEENT-normal/normal eyelids Neck supple/normal carotid upstroke bilaterally; no bruits; no JVD; no thyromegaly chest - CTA/ normal expansion CV - RRR/normal S1 and S2; no murmurs, rubs or gallops;  PMI nondisplaced Abdomen -NT/ND, no HSM, no mass, + bowel sounds, no bruit 2+ femoral pulses, no bruits Ext-no edema, chords, 2+ DP Neuro-grossly nonfocal  ECG - Sinus rhythm at a rate of 58. Nonspecific ST changes. personally reviewed  A/P  1 Chest pain-symptoms are extremely atypical. They have been present intermittently for 20 years. Last several seconds and resolve and not related to exertion. Electrocardiogram shows no diagnostic ST changes. Echocardiogram shows normal LV function. I do not think these are likely cardiac related. We discussed the possibility of a functional study but she would prefer to be conservative at this point. We  can consider further testing in the future if her symptoms change.  2 hypertension-blood pressure is mildly elevated but she follows this closely and it is typically control. Continue present medications and follow-up.  3 hyperlipidemia-continue statin.   Kirk Ruths, MD

## 2016-09-20 ENCOUNTER — Encounter: Payer: Self-pay | Admitting: Cardiology

## 2016-09-20 ENCOUNTER — Ambulatory Visit (INDEPENDENT_AMBULATORY_CARE_PROVIDER_SITE_OTHER): Payer: PPO | Admitting: Cardiology

## 2016-09-20 VITALS — BP 158/72 | HR 50 | Ht 65.0 in | Wt 177.0 lb

## 2016-09-20 DIAGNOSIS — E7800 Pure hypercholesterolemia, unspecified: Secondary | ICD-10-CM

## 2016-09-20 DIAGNOSIS — R072 Precordial pain: Secondary | ICD-10-CM | POA: Diagnosis not present

## 2016-09-20 DIAGNOSIS — I10 Essential (primary) hypertension: Secondary | ICD-10-CM | POA: Diagnosis not present

## 2016-09-20 NOTE — Patient Instructions (Signed)
Your physician recommends that you schedule a follow-up appointment in: AS NEEDED  

## 2016-09-27 ENCOUNTER — Other Ambulatory Visit: Payer: Self-pay | Admitting: Family Medicine

## 2016-10-20 ENCOUNTER — Other Ambulatory Visit: Payer: Self-pay | Admitting: Family Medicine

## 2016-11-03 DIAGNOSIS — Z01419 Encounter for gynecological examination (general) (routine) without abnormal findings: Secondary | ICD-10-CM | POA: Diagnosis not present

## 2016-11-03 DIAGNOSIS — Z1231 Encounter for screening mammogram for malignant neoplasm of breast: Secondary | ICD-10-CM | POA: Diagnosis not present

## 2016-11-03 DIAGNOSIS — N958 Other specified menopausal and perimenopausal disorders: Secondary | ICD-10-CM | POA: Diagnosis not present

## 2016-11-03 DIAGNOSIS — Z6829 Body mass index (BMI) 29.0-29.9, adult: Secondary | ICD-10-CM | POA: Diagnosis not present

## 2016-11-03 DIAGNOSIS — M8588 Other specified disorders of bone density and structure, other site: Secondary | ICD-10-CM | POA: Diagnosis not present

## 2016-11-03 LAB — HM MAMMOGRAPHY

## 2016-11-16 ENCOUNTER — Ambulatory Visit (INDEPENDENT_AMBULATORY_CARE_PROVIDER_SITE_OTHER): Payer: PPO | Admitting: Family Medicine

## 2016-11-16 ENCOUNTER — Encounter: Payer: Self-pay | Admitting: Family Medicine

## 2016-11-16 VITALS — BP 156/58 | HR 62 | Temp 97.8°F | Ht 64.0 in | Wt 174.0 lb

## 2016-11-16 DIAGNOSIS — E663 Overweight: Secondary | ICD-10-CM

## 2016-11-16 DIAGNOSIS — Z23 Encounter for immunization: Secondary | ICD-10-CM

## 2016-11-16 DIAGNOSIS — E782 Mixed hyperlipidemia: Secondary | ICD-10-CM

## 2016-11-16 DIAGNOSIS — M8580 Other specified disorders of bone density and structure, unspecified site: Secondary | ICD-10-CM

## 2016-11-16 DIAGNOSIS — I10 Essential (primary) hypertension: Secondary | ICD-10-CM

## 2016-11-16 MED ORDER — METOPROLOL SUCCINATE ER 50 MG PO TB24: 75.0000 mg | ORAL_TABLET | Freq: Two times a day (BID) | ORAL | 5 refills | Status: DC

## 2016-11-16 NOTE — Assessment & Plan Note (Signed)
Well controlled, no changes to meds. Encouraged heart healthy diet such as the DASH diet and exercise as tolerated.  °

## 2016-11-16 NOTE — Patient Instructions (Signed)

## 2016-11-19 NOTE — Assessment & Plan Note (Signed)
Encouraged DASH diet, decrease po intake and increase exercise as tolerated. Needs 7-8 hours of sleep nightly. Avoid trans fats, eat small, frequent meals every 4-5 hours with lean proteins, complex carbs and healthy fats. Minimize simple carbs 

## 2016-11-19 NOTE — Progress Notes (Signed)
Patient ID: Erika [name], female   DOB: March 02, 1946, 71 y.o.   MRN: [id number]   Subjective:    Patient ID: Erika [name], female    DOB: [birth year], 71 y.o.   MRN: [id number]  Chief Complaint  Patient presents with  . Follow-up  . Hypertension    HPI Patient is in today for follow up. She feels well. No recent febrile illness or acute hospitalizations. Notes she has been checking her BP at [facility] and is typically seeing systolic blood pressures in the 150s. Denies CP/palp/SOB/HA/congestion/fevers/GI or GU c/o. Taking meds as prescribed  Past Medical History:  Diagnosis Date  . Allergic state 06/24/2012  . Anterior cervical lymphadenopathy 02/08/2013  . Arthritis 04/02/2011  . Bronchitis 04/02/2011  . Chicken pox as a child  . Diverticulosis of colon    colonoscopy 04/14/1999  . GERD (gastroesophageal reflux disease)   . History of fibrocystic disease of breast   . Hyperlipidemia, mixed 02/20/2007   Followed as Primary Care Patient/ [facility]/ [facility]  Goal LDL < 130 HBP/Pos fm hx      . Hypertension   . Hypokalemia 06/24/2012  . Hyponatremia 08/02/2014  . IBS (irritable bowel syndrome) 04/02/2011  . Measles as a child  . Mumps as a child  . Obesity   . Osteopenia   . Overactive bladder 04/02/2011  . Overweight   . Preventative health care 03/13/2012  . Shoulder pain, bilateral 06/14/2011    Past Surgical History:  Procedure Laterality Date  . BREAST REDUCTION SURGERY  1986  . BUNIONECTOMY Bilateral 1980  . CATARACT EXTRACTION, BILATERAL    . HAMMER TOE SURGERY Right 12-03-13   foot  . MOUTH SURGERY  01-10-13   negative  . TONSILLECTOMY      Family History  Problem Relation Age of Onset  . Stroke Mother 51  . Hyperlipidemia Mother   . Hypertension Mother   . Heart attack Father 73  . Hypertension Father   . Heart disease Father   . Hypertension Brother   . Aneurysm Brother        aortic  . Hyperlipidemia Brother   . Heart disease Brother        aortic aneurysm  rupture  . Multiple sclerosis Daughter   . Alcohol abuse Son   . Schizophrenia Son   . Bipolar disorder Son   . Mental illness Son        schizophrenic, bipolar    Social History   Social History  . Marital status: Married    Spouse name: N/A  . Number of children: 4  . 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 Smoking status: Never Smoker  . Smokeless tobacco: Never Used  . Alcohol use No  . Drug use: No  . Sexual activity: Not Currently   Other Topics Concern  . Not on file   Social History Narrative   4 children 2 are adopted    Outpatient Medications Prior to Visit  Medication Sig Dispense Refill  . amLODipine (NORVASC) 10 MG tablet Take 0.5 tablets (5 mg total) by mouth 2 (two) times daily. 30 tablet 7  . aspirin 81 MG tablet Take 81 mg by mouth daily.      Marland Kitchen atorvastatin (LIPITOR) 10 MG tablet Take 1 tablet (10 mg total) by mouth daily at 6 PM. 90 tablet 2  . calcium carbonate (TUMS) 500 MG chewable tablet Chew 1 tablet  by mouth as needed.     . cetirizine (ZYRTEC) 10 MG tablet Take 10 mg by mouth at bedtime as needed.      . Cholecalciferol (VITAMIN D PO) Take 1 capsule by mouth daily.    . cycloSPORINE (RESTASIS) 0.05 % ophthalmic emulsion Place 1 drop into both eyes 2 (two) times daily.    . diphenhydramine-acetaminophen (TYLENOL PM) 25-500 MG TABS Take 1 tablet by mouth at bedtime as needed.      . Multiple Minerals-Vitamins (CITRACAL PLUS PO) Take 1 tablet by mouth daily.      Marland Kitchen omeprazole (PRILOSEC) 20 MG capsule TAKE ONE CAPSULE BY MOUTH EVERY DAY 90 capsule 2  . ranitidine (ZANTAC) 300 MG tablet TAKE 1 TABLET (300 MG TOTAL) BY MOUTH AT BEDTIME. 30 tablet 2  . triamterene-hydrochlorothiazide (MAXZIDE-25) 37.5-25 MG tablet TAKE 1 TABLET BY MOUTH DAILY. 30 tablet 1  . trospium (SANCTURA) 20 MG tablet Take 20 mg by mouth daily.      . valsartan (DIOVAN) 320 MG tablet TAKE 1 TABLET BY MOUTH EVERY DAY 30  tablet 5  . Vitamin D, Ergocalciferol, (DRISDOL) 50000 units CAPS capsule Take 1 capsule (50,000 Units total) by mouth every 7 (seven) days. 4 capsule 4  . metoprolol succinate (TOPROL-XL) 50 MG 24 hr tablet TAKE 1 TABLET BY MOUTH TWICE DAILY WITH OR IMMEDIATELY FOLLOWING A MEAL 180 tablet 1   No facility-administered medications prior to visit.     No Known Allergies  Review of Systems  Constitutional: Negative for fever and malaise/fatigue.  HENT: Negative for congestion.   Eyes: Negative for blurred vision.  Respiratory: Negative for shortness of breath.   Cardiovascular: Negative for chest pain, palpitations and leg swelling.  Gastrointestinal: Negative for abdominal pain, blood in stool and nausea.  Genitourinary: Negative for dysuria and frequency.  Musculoskeletal: Negative for falls.  Skin: Negative for rash.  Neurological: Negative for dizziness, loss of consciousness and headaches.  Endo/Heme/Allergies: Negative for environmental allergies.  Psychiatric/Behavioral: Negative for depression. The patient is not nervous/anxious.        Objective:    Physical Exam  Constitutional: She is oriented to person, place, and time. She appears well-developed and well-nourished. No distress.  HENT:  Head: Normocephalic and atraumatic.  Nose: Nose normal.  Eyes: Right eye exhibits no discharge. Left eye exhibits no discharge.  Neck: Normal range of motion. Neck supple.  Cardiovascular: Normal rate and regular rhythm.   No murmur heard. Pulmonary/Chest: Effort normal and breath sounds normal.  Abdominal: Soft. Bowel sounds are normal. There is no tenderness.  Musculoskeletal: She exhibits no edema.  Neurological: She is alert and oriented to person, place, and time.  Skin: Skin is warm and dry.  Psychiatric: She has a normal mood and affect.  Nursing note and vitals reviewed.   BP (!) 156/58   Pulse 62   Temp 97.8 F (36.6 C) (Oral)   Ht 5\' 4"  (1.626 m)   Wt 174 lb (78.9  kg)   SpO2 99%   BMI 29.87 kg/m  Wt Readings from Last 3 Encounters:  11/16/16 174 lb (78.9 kg)  09/20/16 177 lb (80.3 kg)  08/07/16 176 lb (79.8 kg)     Lab Results  Component Value Date   WBC 5.6 05/15/2016   HGB 11.4 (L) 05/15/2016   HCT 33.3 (L) 05/15/2016   PLT 328.0 05/15/2016   GLUCOSE 84 05/15/2016   CHOL 192 05/15/2016   TRIG 158.0 (H) 05/15/2016   HDL 42.60 05/15/2016  LDLDIRECT 163.0 07/24/2014   LDLCALC 118 (H) 05/15/2016   ALT 33 05/15/2016   AST 23 05/15/2016   NA 136 05/15/2016   K 3.4 (L) 05/15/2016   CL 98 05/15/2016   CREATININE 1.22 (H) 05/15/2016   BUN 14 05/15/2016   CO2 31 05/15/2016   TSH 1.78 05/15/2016    Lab Results  Component Value Date   TSH 1.78 05/15/2016   Lab Results  Component Value Date   WBC 5.6 05/15/2016   HGB 11.4 (L) 05/15/2016   HCT 33.3 (L) 05/15/2016   MCV 83.4 05/15/2016   PLT 328.0 05/15/2016   Lab Results  Component Value Date   NA 136 05/15/2016   K 3.4 (L) 05/15/2016   CO2 31 05/15/2016   GLUCOSE 84 05/15/2016   BUN 14 05/15/2016   CREATININE 1.22 (H) 05/15/2016   BILITOT 0.5 05/15/2016   ALKPHOS 80 05/15/2016   AST 23 05/15/2016   ALT 33 05/15/2016   PROT 7.2 05/15/2016   ALBUMIN 4.0 05/15/2016   CALCIUM 9.8 05/15/2016   GFR 46.27 (L) 05/15/2016   Lab Results  Component Value Date   CHOL 192 05/15/2016   Lab Results  Component Value Date   HDL 42.60 05/15/2016   Lab Results  Component Value Date   LDLCALC 118 (H) 05/15/2016   Lab Results  Component Value Date   TRIG 158.0 (H) 05/15/2016   Lab Results  Component Value Date   CHOLHDL 5 05/15/2016   No results found for: HGBA1C     Assessment & Plan:   Problem List Items Addressed This Visit    Hyperlipidemia, mixed    Encouraged heart healthy diet, increase exercise, avoid trans fats, consider a krill oil cap daily      Relevant Medications   metoprolol succinate (TOPROL-XL) 50 MG 24 hr tablet   Hypertension    Well  controlled, no changes to meds. Encouraged heart healthy diet such as the DASH diet and exercise as tolerated.       Relevant Medications   metoprolol succinate (TOPROL-XL) 50 MG 24 hr tablet   Overweight    Encouraged DASH diet, decrease po intake and increase exercise as tolerated. Needs 7-8 hours of sleep nightly. Avoid trans fats, eat small, frequent meals every 4-5 hours with lean proteins, complex carbs and healthy fats. Minimize simple carbs      Osteopenia    Encouraged to get adequate exercise, calcium and vitamin d intake       Other Visit Diagnoses    Need for immunization against influenza    -  Primary   Relevant Orders   Flu vaccine HIGH DOSE PF (Fluzone High dose) (Completed)      I have changed Ms. Calip's metoprolol succinate. I am also having her maintain her aspirin, Multiple Minerals-Vitamins (CITRACAL PLUS PO), trospium, calcium carbonate, diphenhydramine-acetaminophen, cetirizine, cycloSPORINE, atorvastatin, omeprazole, amLODipine, valsartan, Cholecalciferol (VITAMIN D PO), Vitamin D (Ergocalciferol), triamterene-hydrochlorothiazide, and ranitidine.  Meds ordered this encounter  Medications  . metoprolol succinate (TOPROL-XL) 50 MG 24 hr tablet    Sig: Take 2 tablets (100 mg total) by mouth 2 (two) times daily. TAKE 1 TABLET BY MOUTH TWICE DAILY WITH OR IMMEDIATELY FOLLOWING A MEAL    Dispense:  90 tablet    Refill:  5   Penni Homans, MD

## 2016-11-19 NOTE — Assessment & Plan Note (Signed)
Encouraged heart healthy diet, increase exercise, avoid trans fats, consider a krill oil cap daily 

## 2016-11-19 NOTE — Assessment & Plan Note (Signed)
Encouraged to get adequate exercise, calcium and vitamin d intake 

## 2016-11-26 ENCOUNTER — Other Ambulatory Visit: Payer: Self-pay | Admitting: Family Medicine

## 2016-11-29 ENCOUNTER — Telehealth: Payer: Self-pay | Admitting: *Deleted

## 2016-11-29 NOTE — Telephone Encounter (Signed)
Received Medical Records from [facility]; forwarded to provider/SLS 09/26

## 2016-12-03 ENCOUNTER — Other Ambulatory Visit: Payer: Self-pay | Admitting: Family Medicine

## 2016-12-08 ENCOUNTER — Encounter: Payer: Self-pay | Admitting: Family Medicine

## 2016-12-14 ENCOUNTER — Ambulatory Visit (INDEPENDENT_AMBULATORY_CARE_PROVIDER_SITE_OTHER): Payer: PPO | Admitting: Family Medicine

## 2016-12-14 VITALS — BP 132/60 | HR 79

## 2016-12-14 DIAGNOSIS — I10 Essential (primary) hypertension: Secondary | ICD-10-CM

## 2016-12-14 MED ORDER — METOPROLOL SUCCINATE ER 50 MG PO TB24: 50.0000 mg | ORAL_TABLET | Freq: Two times a day (BID) | ORAL | 0 refills | Status: DC

## 2016-12-14 NOTE — Progress Notes (Addendum)
Pre visit review using our clinic tool,if applicable. No additional management support is needed unless otherwise documented below in the visit note.   Patient in for BP check per order from Dr. Charlett Blake dated 11/16/2016.  Patients Metoprolol changed on last visit. Ordered Metoprolol 50 mg bid. Patient states she has had medication this am  BP = 132/60 P= 79  Per Dr. Charlett Blake patient to continue to take medications as ordered and return on regularly scheduled appointment. Patient agreed.  RN blood pressure check note reviewed. Agree with documention and plan.

## 2017-01-09 DIAGNOSIS — L720 Epidermal cyst: Secondary | ICD-10-CM | POA: Diagnosis not present

## 2017-01-09 DIAGNOSIS — L918 Other hypertrophic disorders of the skin: Secondary | ICD-10-CM | POA: Diagnosis not present

## 2017-01-09 DIAGNOSIS — D225 Melanocytic nevi of trunk: Secondary | ICD-10-CM | POA: Diagnosis not present

## 2017-01-09 DIAGNOSIS — D1801 Hemangioma of skin and subcutaneous tissue: Secondary | ICD-10-CM | POA: Diagnosis not present

## 2017-01-09 DIAGNOSIS — L814 Other melanin hyperpigmentation: Secondary | ICD-10-CM | POA: Diagnosis not present

## 2017-01-09 DIAGNOSIS — D2262 Melanocytic nevi of left upper limb, including shoulder: Secondary | ICD-10-CM | POA: Diagnosis not present

## 2017-01-09 DIAGNOSIS — D2261 Melanocytic nevi of right upper limb, including shoulder: Secondary | ICD-10-CM | POA: Diagnosis not present

## 2017-01-09 DIAGNOSIS — D2271 Melanocytic nevi of right lower limb, including hip: Secondary | ICD-10-CM | POA: Diagnosis not present

## 2017-01-09 DIAGNOSIS — L570 Actinic keratosis: Secondary | ICD-10-CM | POA: Diagnosis not present

## 2017-01-09 DIAGNOSIS — D2272 Melanocytic nevi of left lower limb, including hip: Secondary | ICD-10-CM | POA: Diagnosis not present

## 2017-01-09 DIAGNOSIS — L853 Xerosis cutis: Secondary | ICD-10-CM | POA: Diagnosis not present

## 2017-01-09 DIAGNOSIS — L821 Other seborrheic keratosis: Secondary | ICD-10-CM | POA: Diagnosis not present

## 2017-01-31 ENCOUNTER — Other Ambulatory Visit: Payer: Self-pay | Admitting: Family Medicine

## 2017-04-09 ENCOUNTER — Other Ambulatory Visit: Payer: Self-pay | Admitting: Family Medicine

## 2017-05-06 ENCOUNTER — Other Ambulatory Visit: Payer: Self-pay | Admitting: Family Medicine

## 2017-05-12 ENCOUNTER — Other Ambulatory Visit: Payer: Self-pay | Admitting: Family Medicine

## 2017-05-15 ENCOUNTER — Ambulatory Visit: Payer: PPO | Admitting: *Deleted

## 2017-05-15 ENCOUNTER — Ambulatory Visit: Payer: PPO | Admitting: Family Medicine

## 2017-05-22 ENCOUNTER — Ambulatory Visit (HOSPITAL_BASED_OUTPATIENT_CLINIC_OR_DEPARTMENT_OTHER)
Admission: RE | Admit: 2017-05-22 | Discharge: 2017-05-22 | Disposition: A | Discharge status: Home / Self Care | Payer: PPO | Source: Ambulatory Visit | Attending: Family Medicine | Admitting: Family Medicine

## 2017-05-22 ENCOUNTER — Encounter: Payer: Self-pay | Admitting: Family Medicine

## 2017-05-22 ENCOUNTER — Ambulatory Visit (INDEPENDENT_AMBULATORY_CARE_PROVIDER_SITE_OTHER): Payer: PPO | Admitting: Family Medicine

## 2017-05-22 VITALS — BP 150/60 | HR 63 | Temp 98.0°F | Resp 18 | Wt 181.2 lb

## 2017-05-22 DIAGNOSIS — G479 Sleep disorder, unspecified: Secondary | ICD-10-CM | POA: Diagnosis not present

## 2017-05-22 DIAGNOSIS — M25552 Pain in left hip: Secondary | ICD-10-CM | POA: Insufficient documentation

## 2017-05-22 DIAGNOSIS — E782 Mixed hyperlipidemia: Secondary | ICD-10-CM | POA: Diagnosis not present

## 2017-05-22 DIAGNOSIS — M8580 Other specified disorders of bone density and structure, unspecified site: Secondary | ICD-10-CM

## 2017-05-22 DIAGNOSIS — R7989 Other specified abnormal findings of blood chemistry: Secondary | ICD-10-CM | POA: Diagnosis not present

## 2017-05-22 DIAGNOSIS — I10 Essential (primary) hypertension: Secondary | ICD-10-CM | POA: Diagnosis not present

## 2017-05-22 DIAGNOSIS — R0683 Snoring: Secondary | ICD-10-CM | POA: Diagnosis not present

## 2017-05-22 DIAGNOSIS — R251 Tremor, unspecified: Secondary | ICD-10-CM

## 2017-05-22 DIAGNOSIS — M2550 Pain in unspecified joint: Secondary | ICD-10-CM | POA: Diagnosis not present

## 2017-05-22 DIAGNOSIS — G4710 Hypersomnia, unspecified: Secondary | ICD-10-CM | POA: Diagnosis not present

## 2017-05-22 MED ORDER — METOPROLOL SUCCINATE ER 25 MG PO TB24: 25.0000 mg | ORAL_TABLET | Freq: Two times a day (BID) | ORAL | 0 refills | Status: DC

## 2017-05-22 NOTE — Patient Instructions (Signed)
Tylenol/Acetaminophen Arthritis is 650 mg try taking twice daily every day Tylenol ES 500 mg tabs, 2 tabs twice daily with a max of 3 x daily (max dJoint Pain Joint pain, which is also called arthralgia, can be caused by many things. Joint pain often goes away when you follow your health care provider's instructions for relieving pain at home. However, joint pain can also be caused by conditions that require further treatment. Common causes of joint pain include:  Bruising in the area of the joint.  Overuse of the joint.  Wear and tear on the joints that occur with aging (osteoarthritis).  Various other forms of arthritis.  A buildup of a crystal form of uric acid in the joint (gout).  Infections of the joint (septic arthritis) or of the bone (osteomyelitis).  Your health care provider may recommend medicine to help with the pain. If your joint pain continues, additional tests may be needed to diagnose your condition. Follow these instructions at home: Watch your condition for any changes. Follow these instructions as directed to lessen the pain that you are feeling.  Take medicines only as directed by your health care provider.  Rest the affected area for as long as your health care provider says that you should. If directed to do so, raise the painful joint above the level of your heart while you are sitting or lying down.  Do not do things that cause or worsen pain.  If directed, apply ice to the painful area: ? Put ice in a plastic bag. ? Place a towel between your skin and the bag. ? Leave the ice on for 20 minutes, 2-3 times per day.  Wear an elastic bandage, splint, or sling as directed by your health care provider. Loosen the elastic bandage or splint if your fingers or toes become numb and tingle, or if they turn cold and blue.  Begin exercising or stretching the affected area as directed by your health care provider. Ask your health care provider what types of exercise are  safe for you.  Keep all follow-up visits as directed by your health care provider. This is important.  Contact a health care provider if:  Your pain increases, and medicine does not help.  Your joint pain does not improve within 3 days.  You have increased bruising or swelling.  You have a fever.  You lose 10 lb (4.5 kg) or more without trying. Get help right away if:  You are not able to move the joint.  Your fingers or toes become numb or they turn cold and blue. This information is not intended to replace advice given to you by your health care provider. Make sure you discuss any questions you have with your health care provider. Document Released: 02/20/2005 Document Revised: 07/23/2015 Document Reviewed: 12/02/2013 Elsevier Interactive Patient Education  2018 [place] in 24 hours is 3000 mg in 24 hours)

## 2017-05-22 NOTE — Assessment & Plan Note (Signed)
Encouraged heart healthy diet, increase exercise, avoid trans fats, consider a krill oil cap daily 

## 2017-05-22 NOTE — Assessment & Plan Note (Addendum)
Well controlled without Valsartan so will not add another medication at this time. Encouraged heart healthy diet such as the DASH diet and exercise as tolerated.

## 2017-05-22 NOTE — Assessment & Plan Note (Signed)
Check level 

## 2017-05-22 NOTE — Progress Notes (Signed)
Subjective:  I acted as a Education administrator for Dr. Charlett Blake. [place], [place]  Patient ID: Erika [name], female    DOB: 20-Sep-1945, 72 y.o.   MRN: [id number]  No chief complaint on file.   HPI  Patient is in today for a 6 month follow up and she reports she is doing well. No recent febrile illness or hospitalizations. She is noting some trouble with increase joint pain recently. She notes b/l knee pain, right shoulder pain and left hip pain mostly. She also notes fatigue, restless sleep, snoring, and excessive fatigue. Denies CP/palp/SOB/HA/congestion/fevers/GI or GU c/o. Taking meds as prescribed   Patient Care Team: [name], MD as PCP - General (Family Medicine) Molli Posey, MD as Consulting Physician (Obstetrics and Gynecology) Marica Otter, [place] as Consulting Physician (Optometry) Mcarthur Rossetti, MD as Consulting Physician (Orthopedic Surgery) Renelda Mom, DDS as Consulting Physician (Dentistry)   Past Medical History:  Diagnosis Date  . Allergic state 06/24/2012  . Anterior cervical lymphadenopathy 02/08/2013  . Arthritis 04/02/2011  . Bronchitis 04/02/2011  . Chicken pox as a child  . Diverticulosis of colon    colonoscopy 04/14/1999  . GERD (gastroesophageal reflux disease)   . History of fibrocystic disease of breast   . Hyperlipidemia, mixed 02/20/2007   Followed as Primary Care Patient/ [facility]/ [facility]  Goal LDL < 130 HBP/Pos fm hx      . Hypertension   . Hypokalemia 06/24/2012  . Hyponatremia 08/02/2014  . IBS (irritable bowel syndrome) 04/02/2011  . Measles as a child  . Mumps as a child  . Obesity   . Osteopenia   . Overactive bladder 04/02/2011  . Overweight   . Preventative health care 03/13/2012  . Shoulder pain, bilateral 06/14/2011    Past Surgical History:  Procedure Laterality Date  . BREAST REDUCTION SURGERY  1986  . BUNIONECTOMY Bilateral 1980  . CATARACT EXTRACTION, BILATERAL    . HAMMER TOE SURGERY Right 12-03-13   foot  . MOUTH  SURGERY  01-10-13   negative  . TONSILLECTOMY      Family History  Problem Relation Age of Onset  . Stroke Mother 65  . Hyperlipidemia Mother   . Hypertension Mother   . Heart attack Father 19  . Hypertension Father   . Heart disease Father   . Hypertension Brother   . Aneurysm Brother        aortic  . Hyperlipidemia Brother   . Heart disease Brother        aortic aneurysm rupture  . Multiple sclerosis Daughter   . Alcohol abuse Son   . Schizophrenia Son   . Bipolar disorder Son   . Mental illness Son        schizophrenic, bipolar    Social History   Socioeconomic History  . Marital status: Married    Spouse name: Not on file  . Number of children: 4  . Years of education: Not on file  . Highest education level: Not on file  Social Needs  . Financial resource strain: Not on file  . Food insecurity - worry: Not on file  . Food insecurity - inability: Not on file  . Transportation needs - medical: Not on file  . Transportation needs - non-medical: Not on file  Occupational History  . Occupation: dental hygenist  Tobacco Use  . Smoking status: Never Smoker  . Smokeless tobacco: Never Used  Substance and Sexual Activity  . Alcohol use: No  . Drug use: No  .  Sexual activity: Not Currently  Other Topics Concern  . Not on file  Social History Narrative   4 children 2 are adopted    Outpatient Medications Prior to Visit  Medication Sig Dispense Refill  . amLODipine (NORVASC) 10 MG tablet Take 0.5 tablets (5 mg total) by mouth 2 (two) times daily. 30 tablet 7  . aspirin 81 MG tablet Take 81 mg by mouth daily.      Marland Kitchen atorvastatin (LIPITOR) 10 MG tablet TAKE 1 TABLET (10 MG TOTAL) BY MOUTH DAILY AT 6 PM. 90 tablet 2  . calcium carbonate (TUMS) 500 MG chewable tablet Chew 1 tablet by mouth as needed.     . Cholecalciferol (VITAMIN D PO) Take 1 capsule by mouth daily.    . cycloSPORINE (RESTASIS) 0.05 % ophthalmic emulsion Place 1 drop into both eyes 2 (two) times  daily.    . metoprolol succinate (TOPROL-XL) 50 MG 24 hr tablet Take 1 tablet (50 mg total) by mouth 2 (two) times daily. Take with or immediately following a meal. 90 tablet 0  . Multiple Minerals-Vitamins (CITRACAL PLUS PO) Take 1 tablet by mouth daily.      Marland Kitchen omeprazole (PRILOSEC) 20 MG capsule TAKE ONE CAPSULE BY MOUTH EVERY DAY 90 capsule 2  . ranitidine (ZANTAC) 300 MG tablet TAKE 1 TABLET (300 MG TOTAL) BY MOUTH AT BEDTIME. 30 tablet 2  . triamterene-hydrochlorothiazide (MAXZIDE-25) 37.5-25 MG tablet TAKE 1 TABLET BY MOUTH DAILY. 30 tablet 1  . trospium (SANCTURA) 20 MG tablet Take 20 mg by mouth daily.      . valsartan (DIOVAN) 320 MG tablet TAKE 1 TABLET BY MOUTH EVERY DAY 90 tablet 0  . Vitamin D, Ergocalciferol, (DRISDOL) 50000 units CAPS capsule Take 1 capsule (50,000 Units total) by mouth every 7 (seven) days. 4 capsule 4  . diphenhydramine-acetaminophen (TYLENOL PM) 25-500 MG TABS Take 1 tablet by mouth at bedtime as needed.      . cetirizine (ZYRTEC) 10 MG tablet Take 10 mg by mouth at bedtime as needed.       No facility-administered medications prior to visit.     No Known Allergies  Review of Systems  Constitutional: Negative for fever and malaise/fatigue.  HENT: Negative for congestion.   Eyes: Negative for blurred vision.  Respiratory: Negative for shortness of breath.   Cardiovascular: Negative for chest pain, palpitations and leg swelling.  Gastrointestinal: Negative for abdominal pain, blood in stool and nausea.  Genitourinary: Negative for dysuria and frequency.  Musculoskeletal: Negative for falls.  Skin: Negative for rash.  Neurological: Negative for dizziness, loss of consciousness and headaches.  Endo/Heme/Allergies: Negative for environmental allergies.  Psychiatric/Behavioral: Negative for depression. The patient is not nervous/anxious.        Objective:    Physical Exam  BP (!) 150/60 (BP Location: Left Arm, Patient Position: Sitting, Cuff Size:  Normal)   Pulse 63   Temp 98 F (36.7 C) (Oral)   Resp 18   Wt 181 lb 3.2 oz (82.2 kg)   SpO2 98%   BMI 31.10 kg/m  Wt Readings from Last 3 Encounters:  05/22/17 181 lb 3.2 oz (82.2 kg)  11/16/16 174 lb (78.9 kg)  09/20/16 177 lb (80.3 kg)   BP Readings from Last 3 Encounters:  05/22/17 (!) 150/60  12/14/16 132/60  11/16/16 (!) 156/58     Immunization History  Administered Date(s) Administered  . Influenza Whole 12/05/2010, 12/04/2012  . Influenza, High Dose Seasonal PF 11/16/2016  . Influenza,inj,Quad PF,6+ Mos  01/12/2014  . Influenza-Unspecified 12/30/2014  . Pneumococcal Conjugate-13 01/13/2014  . Pneumococcal Polysaccharide-23 07/22/2008, 12/05/2010  . Td 03/06/2010  . Zoster 02/17/2014    Health Maintenance  Topic Date Due  . MAMMOGRAM  11/10/2018  . COLONOSCOPY  05/13/2019  . TETANUS/TDAP  03/06/2020  . INFLUENZA VACCINE  Completed  . DEXA SCAN  Completed  . Hepatitis C Screening  Completed  . PNA vac Low Risk Adult  Completed    Lab Results  Component Value Date   WBC 5.6 05/15/2016   HGB 11.4 (L) 05/15/2016   HCT 33.3 (L) 05/15/2016   PLT 328.0 05/15/2016   GLUCOSE 84 05/15/2016   CHOL 192 05/15/2016   TRIG 158.0 (H) 05/15/2016   HDL 42.60 05/15/2016   LDLDIRECT 163.0 07/24/2014   LDLCALC 118 (H) 05/15/2016   ALT 33 05/15/2016   AST 23 05/15/2016   NA 136 05/15/2016   K 3.4 (L) 05/15/2016   CL 98 05/15/2016   CREATININE 1.22 (H) 05/15/2016   BUN 14 05/15/2016   CO2 31 05/15/2016   TSH 1.78 05/15/2016    Lab Results  Component Value Date   TSH 1.78 05/15/2016   Lab Results  Component Value Date   WBC 5.6 05/15/2016   HGB 11.4 (L) 05/15/2016   HCT 33.3 (L) 05/15/2016   MCV 83.4 05/15/2016   PLT 328.0 05/15/2016   Lab Results  Component Value Date   NA 136 05/15/2016   K 3.4 (L) 05/15/2016   CO2 31 05/15/2016   GLUCOSE 84 05/15/2016   BUN 14 05/15/2016   CREATININE 1.22 (H) 05/15/2016   BILITOT 0.5 05/15/2016   ALKPHOS 80  05/15/2016   AST 23 05/15/2016   ALT 33 05/15/2016   PROT 7.2 05/15/2016   ALBUMIN 4.0 05/15/2016   CALCIUM 9.8 05/15/2016   GFR 46.27 (L) 05/15/2016   Lab Results  Component Value Date   CHOL 192 05/15/2016   Lab Results  Component Value Date   HDL 42.60 05/15/2016   Lab Results  Component Value Date   LDLCALC 118 (H) 05/15/2016   Lab Results  Component Value Date   TRIG 158.0 (H) 05/15/2016   Lab Results  Component Value Date   CHOLHDL 5 05/15/2016   No results found for: HGBA1C       Assessment & Plan:   Problem List Items Addressed This Visit    Hyperlipidemia, mixed    Encouraged heart healthy diet, increase exercise, avoid trans fats, consider a krill oil cap daily      Relevant Medications   metoprolol succinate (TOPROL-XL) 25 MG 24 hr tablet   Other Relevant Orders   Lipid panel   Hypertension    Well controlled without Valsartan so will not add another medication at this time. Encouraged heart healthy diet such as the DASH diet and exercise as tolerated.       Relevant Medications   metoprolol succinate (TOPROL-XL) 25 MG 24 hr tablet   Other Relevant Orders   CBC with Differential/Platelet   Comprehensive metabolic panel   TSH   T4, free   Osteopenia    Encouraged to get adequate exercise, calcium and vitamin d intake      Low vitamin D level    Check level       Relevant Orders   VITAMIN D 25 Hydroxy (Vit-D Deficiency, Fractures)   Tremor    Has been seen by neurology and no concerning cause found.      Relevant Orders  TSH   T4, free   Left hip pain    Xray ordered stay active and report worsening symptoms      Relevant Orders   DG HIP UNILAT W OR W/O PELVIS 2-3 VIEWS LEFT (Completed)   Snoring    With restless sleep and fatigue. Consider referral to get sleep study.       Other Visit Diagnoses    Hypersomnolence    -  Primary   Restless sleeper       Arthralgia, unspecified joint       Relevant Orders    Sedimentation rate   C-reactive protein   Rheumatoid Factor      I have discontinued Jolonda H. Deer's diphenhydramine-acetaminophen. I am also having her start on metoprolol succinate. Additionally, I am having her maintain her aspirin, Multiple Minerals-Vitamins (CITRACAL PLUS PO), trospium, calcium carbonate, cetirizine, cycloSPORINE, amLODipine, Cholecalciferol (VITAMIN D PO), Vitamin D (Ergocalciferol), ranitidine, atorvastatin, metoprolol succinate, triamterene-hydrochlorothiazide, omeprazole, and valsartan.  Meds ordered this encounter  Medications  . metoprolol succinate (TOPROL-XL) 25 MG 24 hr tablet    Sig: Take 1 tablet (25 mg total) by mouth 2 (two) times daily.    Dispense:  60 tablet    Refill:  0    CMA served as scribe during this visit. History, Physical and Plan performed by medical provider. Documentation and orders reviewed and attested to.  Penni Homans, MD

## 2017-05-23 ENCOUNTER — Telehealth: Payer: Self-pay | Admitting: Family Medicine

## 2017-05-23 DIAGNOSIS — G4733 Obstructive sleep apnea (adult) (pediatric): Secondary | ICD-10-CM

## 2017-05-23 DIAGNOSIS — R0683 Snoring: Secondary | ICD-10-CM | POA: Insufficient documentation

## 2017-05-23 DIAGNOSIS — R251 Tremor, unspecified: Secondary | ICD-10-CM | POA: Insufficient documentation

## 2017-05-23 DIAGNOSIS — G4730 Sleep apnea, unspecified: Secondary | ICD-10-CM | POA: Insufficient documentation

## 2017-05-23 DIAGNOSIS — M25552 Pain in left hip: Secondary | ICD-10-CM | POA: Insufficient documentation

## 2017-05-23 HISTORY — DX: Obstructive sleep apnea (adult) (pediatric): G47.33

## 2017-05-23 NOTE — Assessment & Plan Note (Signed)
Has been seen by neurology and no concerning cause found.

## 2017-05-23 NOTE — Assessment & Plan Note (Signed)
Encouraged to get adequate exercise, calcium and vitamin d intake 

## 2017-05-23 NOTE — Assessment & Plan Note (Signed)
With restless sleep and fatigue. Consider referral to get sleep study.

## 2017-05-23 NOTE — Telephone Encounter (Signed)
Copied from [place] [phone]. Topic: Quick Communication - See Telephone Encounter >> May 23, 2017  8:58 AM Lolita Rieger, RMA wrote: CRM for notification. See Telephone encounter for:   05/23/17.pt would like a call back to discuss when she should have her blood work taken Please call [phone]

## 2017-05-23 NOTE — Assessment & Plan Note (Signed)
Xray ordered stay active and report worsening symptoms

## 2017-05-24 NOTE — Telephone Encounter (Signed)
Pt calling to follow up on when labs need to be done. And also checking on the results of her hip xray

## 2017-05-25 ENCOUNTER — Telehealth: Payer: Self-pay | Admitting: Family Medicine

## 2017-05-25 ENCOUNTER — Other Ambulatory Visit (INDEPENDENT_AMBULATORY_CARE_PROVIDER_SITE_OTHER): Payer: PPO

## 2017-05-25 DIAGNOSIS — I10 Essential (primary) hypertension: Secondary | ICD-10-CM

## 2017-05-25 DIAGNOSIS — M2550 Pain in unspecified joint: Secondary | ICD-10-CM | POA: Diagnosis not present

## 2017-05-25 LAB — CBC WITH DIFFERENTIAL/PLATELET
BASOS PCT: 2.1 % (ref 0.0–3.0)
Basophils Absolute: 0.1 10*3/uL (ref 0.0–0.1)
EOS PCT: 5.8 % — AB (ref 0.0–5.0)
Eosinophils Absolute: 0.3 10*3/uL (ref 0.0–0.7)
HEMATOCRIT: 31.9 % — AB (ref 36.0–46.0)
HEMOGLOBIN: 10.7 g/dL — AB (ref 12.0–15.0)
Lymphocytes Relative: 32.2 % (ref 12.0–46.0)
Lymphs Abs: 1.6 10*3/uL (ref 0.7–4.0)
MCHC: 33.6 g/dL (ref 30.0–36.0)
MCV: 84.7 fl (ref 78.0–100.0)
MONOS PCT: 12.8 % — AB (ref 3.0–12.0)
Monocytes Absolute: 0.6 10*3/uL (ref 0.1–1.0)
Neutro Abs: 2.3 10*3/uL (ref 1.4–7.7)
Neutrophils Relative %: 47.1 % (ref 43.0–77.0)
Platelets: 348 10*3/uL (ref 150.0–400.0)
RBC: 3.77 Mil/uL — AB (ref 3.87–5.11)
RDW: 14.8 % (ref 11.5–15.5)
WBC: 5 10*3/uL (ref 4.0–10.5)

## 2017-05-25 LAB — LIPID PANEL
CHOLESTEROL: 182 mg/dL (ref 0–200)
HDL: 44.4 mg/dL (ref >39.00)
LDL CALC: 104 mg/dL — AB (ref 0–99)
NonHDL: 137.5
Total CHOL/HDL Ratio: 4
Triglycerides: 166 mg/dL — ABNORMAL HIGH (ref 0.0–149.0)
VLDL: 33.2 mg/dL (ref 0.0–40.0)

## 2017-05-25 LAB — COMPREHENSIVE METABOLIC PANEL
ALBUMIN: 3.9 g/dL (ref 3.5–5.2)
ALK PHOS: 75 U/L (ref 39–117)
ALT: 17 U/L (ref 0–35)
AST: 19 U/L (ref 0–37)
BUN: 13 mg/dL (ref 6–23)
CALCIUM: 9.3 mg/dL (ref 8.4–10.5)
CHLORIDE: 98 meq/L (ref 96–112)
CO2: 27 mEq/L (ref 19–32)
Creatinine, Ser: 1.12 mg/dL (ref 0.40–1.20)
GFR: 50.92 mL/min — AB (ref >60.00)
Glucose, Bld: 88 mg/dL (ref 70–99)
POTASSIUM: 3.9 meq/L (ref 3.5–5.1)
Sodium: 133 mEq/L — ABNORMAL LOW (ref 135–145)
Total Bilirubin: 0.8 mg/dL (ref 0.2–1.2)
Total Protein: 7.2 g/dL (ref 6.0–8.3)

## 2017-05-25 LAB — VITAMIN D 25 HYDROXY (VIT D DEFICIENCY, FRACTURES): VITD: 29.87 ng/mL — AB (ref 30.00–100.00)

## 2017-05-25 LAB — T4, FREE: Free T4: 0.93 ng/dL (ref 0.60–1.60)

## 2017-05-25 LAB — SEDIMENTATION RATE: SED RATE: 13 mm/h (ref 0–30)

## 2017-05-25 LAB — TSH: TSH: 1.56 u[IU]/mL (ref 0.35–4.50)

## 2017-05-25 LAB — C-REACTIVE PROTEIN: CRP: 0.2 mg/dL — AB (ref 0.5–20.0)

## 2017-05-25 NOTE — Telephone Encounter (Signed)
Copied from [place] [phone]. Topic: Quick Communication - See Telephone Encounter >> May 25, 2017  9:28 AM Bea Graff, NT wrote: CRM for notification. See Telephone encounter for: 05/25/17. Pt would like a call with her xray results. She also ws confused the day of her appt and did not have her labs drawn that day. She will be coming in today at 10:30 to have her labs drawn.

## 2017-05-26 LAB — RHEUMATOID FACTOR

## 2017-05-28 ENCOUNTER — Other Ambulatory Visit: Payer: Self-pay | Admitting: Family Medicine

## 2017-05-28 DIAGNOSIS — K625 Hemorrhage of anus and rectum: Secondary | ICD-10-CM

## 2017-05-28 NOTE — Telephone Encounter (Signed)
Spoke with patient about results she voiced her understanding  

## 2017-05-28 NOTE — Progress Notes (Signed)
i

## 2017-05-29 NOTE — Telephone Encounter (Signed)
Spoke with patient.

## 2017-06-10 ENCOUNTER — Other Ambulatory Visit: Payer: Self-pay | Admitting: Family Medicine

## 2017-06-14 ENCOUNTER — Other Ambulatory Visit (INDEPENDENT_AMBULATORY_CARE_PROVIDER_SITE_OTHER): Payer: PPO

## 2017-06-14 ENCOUNTER — Other Ambulatory Visit: Payer: Self-pay | Admitting: Emergency Medicine

## 2017-06-14 DIAGNOSIS — R5383 Other fatigue: Secondary | ICD-10-CM

## 2017-06-14 LAB — FECAL OCCULT BLOOD, IMMUNOCHEMICAL: FECAL OCCULT BLD: NEGATIVE

## 2017-06-14 NOTE — Progress Notes (Signed)
ifob

## 2017-07-04 ENCOUNTER — Other Ambulatory Visit: Payer: Self-pay | Admitting: Family Medicine

## 2017-07-06 ENCOUNTER — Other Ambulatory Visit: Payer: Self-pay | Admitting: Family Medicine

## 2017-07-17 ENCOUNTER — Other Ambulatory Visit: Payer: Self-pay | Admitting: Family Medicine

## 2017-07-31 ENCOUNTER — Encounter: Payer: Self-pay | Admitting: Family Medicine

## 2017-07-31 ENCOUNTER — Ambulatory Visit (INDEPENDENT_AMBULATORY_CARE_PROVIDER_SITE_OTHER): Payer: PPO | Admitting: Family Medicine

## 2017-07-31 ENCOUNTER — Ambulatory Visit (HOSPITAL_BASED_OUTPATIENT_CLINIC_OR_DEPARTMENT_OTHER)
Admission: RE | Admit: 2017-07-31 | Discharge: 2017-07-31 | Disposition: A | Discharge status: Home / Self Care | Payer: PPO | Source: Ambulatory Visit | Attending: Family Medicine | Admitting: Family Medicine

## 2017-07-31 VITALS — BP 140/52 | HR 61 | Temp 97.7°F | Resp 18 | Wt 180.0 lb

## 2017-07-31 DIAGNOSIS — I10 Essential (primary) hypertension: Secondary | ICD-10-CM | POA: Diagnosis not present

## 2017-07-31 DIAGNOSIS — R700 Elevated erythrocyte sedimentation rate: Secondary | ICD-10-CM | POA: Diagnosis not present

## 2017-07-31 DIAGNOSIS — R7989 Other specified abnormal findings of blood chemistry: Secondary | ICD-10-CM | POA: Diagnosis not present

## 2017-07-31 DIAGNOSIS — M791 Myalgia, unspecified site: Secondary | ICD-10-CM | POA: Diagnosis not present

## 2017-07-31 DIAGNOSIS — K449 Diaphragmatic hernia without obstruction or gangrene: Secondary | ICD-10-CM | POA: Diagnosis not present

## 2017-07-31 DIAGNOSIS — M546 Pain in thoracic spine: Secondary | ICD-10-CM

## 2017-07-31 DIAGNOSIS — M542 Cervicalgia: Secondary | ICD-10-CM | POA: Diagnosis not present

## 2017-07-31 DIAGNOSIS — R21 Rash and other nonspecific skin eruption: Secondary | ICD-10-CM | POA: Diagnosis not present

## 2017-07-31 DIAGNOSIS — E782 Mixed hyperlipidemia: Secondary | ICD-10-CM

## 2017-07-31 DIAGNOSIS — M50321 Other cervical disc degeneration at C4-C5 level: Secondary | ICD-10-CM | POA: Insufficient documentation

## 2017-07-31 DIAGNOSIS — W57XXXA Bitten or stung by nonvenomous insect and other nonvenomous arthropods, initial encounter: Secondary | ICD-10-CM | POA: Diagnosis not present

## 2017-07-31 DIAGNOSIS — R5383 Other fatigue: Secondary | ICD-10-CM | POA: Diagnosis not present

## 2017-07-31 DIAGNOSIS — E663 Overweight: Secondary | ICD-10-CM

## 2017-07-31 HISTORY — DX: Pain in thoracic spine: M54.6

## 2017-07-31 LAB — CBC WITH DIFFERENTIAL/PLATELET
BASOS ABS: 0.1 10*3/uL (ref 0.0–0.1)
Basophils Relative: 2.2 % (ref 0.0–3.0)
EOS ABS: 0.3 10*3/uL (ref 0.0–0.7)
Eosinophils Relative: 5.8 % — ABNORMAL HIGH (ref 0.0–5.0)
HCT: 31.4 % — ABNORMAL LOW (ref 36.0–46.0)
HEMOGLOBIN: 10.7 g/dL — AB (ref 12.0–15.0)
Lymphocytes Relative: 30.1 % (ref 12.0–46.0)
Lymphs Abs: 1.3 10*3/uL (ref 0.7–4.0)
MCHC: 34.1 g/dL (ref 30.0–36.0)
MCV: 80.4 fl (ref 78.0–100.0)
Monocytes Absolute: 0.5 10*3/uL (ref 0.1–1.0)
Monocytes Relative: 11.9 % (ref 3.0–12.0)
NEUTROS ABS: 2.2 10*3/uL (ref 1.4–7.7)
Neutrophils Relative %: 50 % (ref 43.0–77.0)
PLATELETS: 344 10*3/uL (ref 150.0–400.0)
RBC: 3.9 Mil/uL (ref 3.87–5.11)
RDW: 14 % (ref 11.5–15.5)
WBC: 4.3 10*3/uL (ref 4.0–10.5)

## 2017-07-31 LAB — COMPREHENSIVE METABOLIC PANEL
ALBUMIN: 4 g/dL (ref 3.5–5.2)
ALK PHOS: 87 U/L (ref 39–117)
ALT: 15 U/L (ref 0–35)
AST: 18 U/L (ref 0–37)
BILIRUBIN TOTAL: 0.5 mg/dL (ref 0.2–1.2)
BUN: 10 mg/dL (ref 6–23)
CALCIUM: 9.5 mg/dL (ref 8.4–10.5)
CO2: 29 mEq/L (ref 19–32)
CREATININE: 1.01 mg/dL (ref 0.40–1.20)
Chloride: 96 mEq/L (ref 96–112)
GFR: 57.34 mL/min — ABNORMAL LOW (ref >60.00)
Glucose, Bld: 100 mg/dL — ABNORMAL HIGH (ref 70–99)
Potassium: 4.1 mEq/L (ref 3.5–5.1)
Sodium: 132 mEq/L — ABNORMAL LOW (ref 135–145)
TOTAL PROTEIN: 7.2 g/dL (ref 6.0–8.3)

## 2017-07-31 LAB — SEDIMENTATION RATE: Sed Rate: 33 mm/hr — ABNORMAL HIGH (ref 0–30)

## 2017-07-31 MED ORDER — ATORVASTATIN CALCIUM 10 MG PO TABS: 10.0000 mg | ORAL_TABLET | Freq: Every day | ORAL | 2 refills | Status: DC

## 2017-07-31 MED ORDER — TIZANIDINE HCL 2 MG PO TABS: 2.0000 mg | ORAL_TABLET | Freq: Every evening | ORAL | 0 refills | Status: DC | PRN

## 2017-07-31 NOTE — Assessment & Plan Note (Signed)
With arthritic changes and foraminal narrowing noted. Encouraged moist heat and gentle stretching as tolerated. May try NSAIDs and prescription meds as directed and report if symptoms worsen or seek immediate care. Tizanidine prn and if worsens or does not resolve consider MRI and referral

## 2017-07-31 NOTE — Progress Notes (Signed)
Subjective:  I acted as a Education administrator for Dr. Charlett Blake. [place], [place]  Patient ID: Erika [name], female    DOB: 1945-03-11, 72 y.o.   MRN: [id number]  No chief complaint on file.   HPI  Patient is in today for a 2 month follow up and is noting some concerns.  She is noting a tick bite about 2 weeks ago on her stomach that was red and itchy and did have a little ring around it for short time.  She was working in her yard moving a lot of pine straw when this developed.  She had a lot of right-sided neck pain and right shoulder pain since then as well.  She has ongoing fatigue but does not feel that is worsening.  Denies fevers, chills, headache.  No recent febrile illness.  She notes her husband is slowly improving so her stress is lifted some but is still notable.  No recent hospitalization. Uses tylenol for the pain with some relief. Denies CP/palp/SOB/HA/congestion/fevers/GI or GU c/o. Taking meds as prescribed  Patient Care Team: [name], MD as PCP - General (Family Medicine) Molli Posey, MD as Consulting Physician (Obstetrics and Gynecology) Marica Otter, [place] as Consulting Physician (Optometry) Mcarthur Rossetti, MD as Consulting Physician (Orthopedic Surgery) Renelda Mom, DDS as Consulting Physician (Dentistry)   Past Medical History:  Diagnosis Date  . Allergic state 06/24/2012  . Anterior cervical lymphadenopathy 02/08/2013  . Arthritis 04/02/2011  . Bronchitis 04/02/2011  . Chicken pox as a child  . Diverticulosis of colon    colonoscopy 04/14/1999  . GERD (gastroesophageal reflux disease)   . History of fibrocystic disease of breast   . Hyperlipidemia, mixed 02/20/2007   Followed as Primary Care Patient/  Healthcare/ [facility]  Goal LDL < 130 HBP/Pos fm hx      . Hypertension   . Hypokalemia 06/24/2012  . Hyponatremia 08/02/2014  . IBS (irritable bowel syndrome) 04/02/2011  . Measles as a child  . Mumps as a child  . Obesity   . Osteopenia   . Overactive  bladder 04/02/2011  . Overweight   . Preventative health care 03/13/2012  . Shoulder pain, bilateral 06/14/2011    Past Surgical History:  Procedure Laterality Date  . BREAST REDUCTION SURGERY  1986  . BUNIONECTOMY Bilateral 1980  . CATARACT EXTRACTION, BILATERAL    . HAMMER TOE SURGERY Right 12-03-13   foot  . MOUTH SURGERY  01-10-13   negative  . TONSILLECTOMY      Family History  Problem Relation Age of Onset  . Stroke Mother 15  . Hyperlipidemia Mother   . Hypertension Mother   . Heart attack Father 75  . Hypertension Father   . Heart disease Father   . Hypertension Brother   . Aneurysm Brother        aortic  . Hyperlipidemia Brother   . Heart disease Brother        aortic aneurysm rupture  . Multiple sclerosis Daughter   . Alcohol abuse Son   . Schizophrenia Son   . Bipolar disorder Son   . Mental illness Son        schizophrenic, bipolar    Social History   Socioeconomic History  . Marital status: Married    Spouse name: Not on file  . Number of children: 4  . Years of education: Not on file  . Highest education level: Not on file  Occupational History  . Occupation: dental hygenist  Social Needs  .  Financial resource strain: Not on file  . Food insecurity:    Worry: Not on file    Inability: Not on file  . Transportation needs:    Medical: Not on file    Non-medical: Not on file  Tobacco Use  . Smoking status: Never Smoker  . Smokeless tobacco: Never Used  Substance and Sexual Activity  . Alcohol use: No  . Drug use: No  . Sexual activity: Not Currently  Lifestyle  . Physical activity:    Days per week: Not on file    Minutes per session: Not on file  . Stress: Not on file  Relationships  . Social connections:    Talks on phone: Not on file    Gets together: Not on file    Attends religious service: Not on file    Active member of club or organization: Not on file    Attends meetings of clubs or organizations: Not on file    Relationship  status: Not on file  . Intimate partner violence:    Fear of current or ex partner: Not on file    Emotionally abused: Not on file    Physically abused: Not on file    Forced sexual activity: Not on file  Other Topics Concern  . Not on file  Social History Narrative   4 children 2 are adopted    Outpatient Medications Prior to Visit  Medication Sig Dispense Refill  . amLODipine (NORVASC) 10 MG tablet Take 0.5 tablets (5 mg total) by mouth 2 (two) times daily. 30 tablet 7  . aspirin 81 MG tablet Take 81 mg by mouth daily.      . calcium carbonate (TUMS) 500 MG chewable tablet Chew 1 tablet by mouth as needed.     . cetirizine (ZYRTEC) 10 MG tablet Take 10 mg by mouth at bedtime as needed.      . Cholecalciferol (VITAMIN D PO) Take 1 capsule by mouth daily.    . cycloSPORINE (RESTASIS) 0.05 % ophthalmic emulsion Place 1 drop into both eyes 2 (two) times daily.    . metoprolol succinate (TOPROL-XL) 25 MG 24 hr tablet Take 1 tablet (25 mg total) by mouth 2 (two) times daily. 60 tablet 0  . metoprolol succinate (TOPROL-XL) 50 MG 24 hr tablet Take 1 tablet (50 mg total) by mouth 2 (two) times daily. Take with or immediately following a meal. 90 tablet 0  . Multiple Minerals-Vitamins (CITRACAL PLUS PO) Take 1 tablet by mouth daily.      Marland Kitchen omeprazole (PRILOSEC) 20 MG capsule TAKE ONE CAPSULE BY MOUTH EVERY DAY 90 capsule 2  . ranitidine (ZANTAC) 300 MG tablet TAKE 1 TABLET (300 MG TOTAL) BY MOUTH AT BEDTIME. 30 tablet 2  . triamterene-hydrochlorothiazide (MAXZIDE-25) 37.5-25 MG tablet TAKE 1 TABLET BY MOUTH DAILY. 30 tablet 0  . trospium (SANCTURA) 20 MG tablet Take 20 mg by mouth daily.      . valsartan (DIOVAN) 320 MG tablet TAKE 1 TABLET BY MOUTH EVERY DAY 90 tablet 0  . Vitamin D, Ergocalciferol, (DRISDOL) 50000 units CAPS capsule TAKE 1 CAPSULE (50,000 UNITS TOTAL) BY MOUTH EVERY 7 (SEVEN) DAYS. 4 capsule 3  . atorvastatin (LIPITOR) 10 MG tablet TAKE 1 TABLET (10 MG TOTAL) BY MOUTH DAILY AT  6 PM. 90 tablet 2   No facility-administered medications prior to visit.     No Known Allergies  Review of Systems  Constitutional: Positive for malaise/fatigue. Negative for fever.  HENT: Negative for congestion.  Eyes: Negative for blurred vision.  Respiratory: Negative for shortness of breath.   Cardiovascular: Negative for chest pain, palpitations and leg swelling.  Gastrointestinal: Negative for abdominal pain, blood in stool and nausea.  Genitourinary: Negative for dysuria and frequency.  Musculoskeletal: Positive for back pain and neck pain. Negative for falls.  Skin: Positive for rash.  Neurological: Negative for dizziness, loss of consciousness and headaches.  Endo/Heme/Allergies: Negative for environmental allergies.  Psychiatric/Behavioral: Negative for depression. The patient is not nervous/anxious.        Objective:    Physical Exam  Constitutional: She is oriented to person, place, and time. No distress.  HENT:  Head: Normocephalic and atraumatic.  Eyes: Conjunctivae are normal.  Neck: Neck supple. No thyromegaly present.  Cardiovascular: Normal rate, regular rhythm and normal heart sounds.  No murmur heard. Pulmonary/Chest: Effort normal and breath sounds normal. She has no wheezes.  Abdominal: She exhibits no distension and no mass.  Musculoskeletal: She exhibits no edema.  Muscle spasm over right SCM with pain with palp. LN noted on right cervical region. Hyperflexion over upper thoracic spine and hyperextension in cervical spine and some mild scoliosis to left upper thoracic spine  Lymphadenopathy:    She has no cervical adenopathy.  Neurological: She is alert and oriented to person, place, and time.  Skin: Skin is warm and dry. No rash noted. She is not diaphoretic.  Red papule on abdominal wall  Psychiatric: Judgment normal.    BP (!) 140/52 (BP Location: Left Arm, Patient Position: Sitting, Cuff Size: Normal)   Pulse 61   Temp 97.7 F (36.5 C)  (Oral)   Resp 18   Wt 180 lb (81.6 kg)   SpO2 98%   BMI 30.90 kg/m  Wt Readings from Last 3 Encounters:  07/31/17 180 lb (81.6 kg)  05/22/17 181 lb 3.2 oz (82.2 kg)  11/16/16 174 lb (78.9 kg)   BP Readings from Last 3 Encounters:  07/31/17 (!) 140/52  05/22/17 (!) 150/60  12/14/16 132/60     Immunization History  Administered Date(s) Administered  . Influenza Whole 12/05/2010, 12/04/2012  . Influenza, High Dose Seasonal PF 11/16/2016  . Influenza,inj,Quad PF,6+ Mos 01/12/2014  . Influenza-Unspecified 12/30/2014  . Pneumococcal Conjugate-13 01/13/2014  . Pneumococcal Polysaccharide-23 07/22/2008, 12/05/2010  . Td 03/06/2010  . Zoster 02/17/2014    Health Maintenance  Topic Date Due  . INFLUENZA VACCINE  10/04/2017  . MAMMOGRAM  11/10/2018  . COLONOSCOPY  05/13/2019  . TETANUS/TDAP  03/06/2020  . DEXA SCAN  Completed  . Hepatitis C Screening  Completed  . PNA vac Low Risk Adult  Completed    Lab Results  Component Value Date   WBC 5.0 05/25/2017   HGB 10.7 (L) 05/25/2017   HCT 31.9 (L) 05/25/2017   PLT 348.0 05/25/2017   GLUCOSE 88 05/25/2017   CHOL 182 05/25/2017   TRIG 166.0 (H) 05/25/2017   HDL 44.40 05/25/2017   LDLDIRECT 163.0 07/24/2014   LDLCALC 104 (H) 05/25/2017   ALT 17 05/25/2017   AST 19 05/25/2017   NA 133 (L) 05/25/2017   K 3.9 05/25/2017   CL 98 05/25/2017   CREATININE 1.12 05/25/2017   BUN 13 05/25/2017   CO2 27 05/25/2017   TSH 1.56 05/25/2017    Lab Results  Component Value Date   TSH 1.56 05/25/2017   Lab Results  Component Value Date   WBC 5.0 05/25/2017   HGB 10.7 (L) 05/25/2017   HCT 31.9 (L) 05/25/2017   MCV  84.7 05/25/2017   PLT 348.0 05/25/2017   Lab Results  Component Value Date   NA 133 (L) 05/25/2017   K 3.9 05/25/2017   CO2 27 05/25/2017   GLUCOSE 88 05/25/2017   BUN 13 05/25/2017   CREATININE 1.12 05/25/2017   BILITOT 0.8 05/25/2017   ALKPHOS 75 05/25/2017   AST 19 05/25/2017   ALT 17 05/25/2017   PROT  7.2 05/25/2017   ALBUMIN 3.9 05/25/2017   CALCIUM 9.3 05/25/2017   GFR 50.92 (L) 05/25/2017   Lab Results  Component Value Date   CHOL 182 05/25/2017   Lab Results  Component Value Date   HDL 44.40 05/25/2017   Lab Results  Component Value Date   LDLCALC 104 (H) 05/25/2017   Lab Results  Component Value Date   TRIG 166.0 (H) 05/25/2017   Lab Results  Component Value Date   CHOLHDL 4 05/25/2017   No results found for: HGBA1C       Assessment & Plan:   Problem List Items Addressed This Visit    Hyperlipidemia, mixed    Somehow missed the Atorvastatin for 3 months and just realized it and restarted it a week ago.      Relevant Medications   atorvastatin (LIPITOR) 10 MG tablet   Hypertension    Well controlled, no changes to meds. Encouraged heart healthy diet such as the DASH diet and exercise as tolerated.       Relevant Medications   atorvastatin (LIPITOR) 10 MG tablet   Overweight    Encouraged DASH diet, decrease po intake and increase exercise as tolerated. Needs 7-8 hours of sleep nightly. Avoid trans fats, eat small, frequent meals every 4-5 hours with lean proteins, complex carbs and healthy fats. Minimize simple carbs      Fatigue    May be improving slightly. No changes      Relevant Orders   B. burgdorfi antibodies   Sedimentation rate   Rocky mtn spotted fvr abs pnl(IgG+IgM)   Comprehensive metabolic panel   CBC w/Diff   Low vitamin D level    Labs reveal deficiency. Start on Vitamin D 50000 IU caps, 1 cap po weekly x 12 weeks. Disp #4 with 4 rf. Also take daily Vitamin D over the counter. If already taking a daily supplement increase by 1000 IU daily and if not start Vitamin D 2000 IU daily. She is dealing well with the supplements      Tick bite    On abdomen with rash and she reports it initially had a halo now does not. Notes fatigue and increased myalgias was working in yard in pine straw. Check labs      Relevant Orders   B. burgdorfi  antibodies   Sedimentation rate   Rocky mtn spotted fvr abs pnl(IgG+IgM)   Thoracic back pain    Encouraged moist heat and gentle stretching as tolerated. May try NSAIDs and prescription meds as directed and report if symptoms worsen or seek immediate care. Some scoliosis noted on exam. Check xrays shows kyphosis      Relevant Medications   tiZANidine (ZANAFLEX) 2 MG tablet   Other Relevant Orders   DG Thoracic Spine 2 View (Completed)   Comprehensive metabolic panel   CBC w/Diff   Neck pain on right side - Primary    With arthritic changes and foraminal narrowing noted. Encouraged moist heat and gentle stretching as tolerated. May try NSAIDs and prescription meds as directed and report if symptoms worsen or seek  immediate care. Tizanidine prn and if worsens or does not resolve consider MRI and referral      Relevant Orders   DG Cervical Spine Complete (Completed)   B. burgdorfi antibodies   Sedimentation rate   Rocky mtn spotted fvr abs pnl(IgG+IgM)    Other Visit Diagnoses    Rash       Relevant Orders   B. burgdorfi antibodies   Sedimentation rate   Rocky mtn spotted fvr abs pnl(IgG+IgM)      I am having Janora Norlander start on tiZANidine. I am also having her maintain her aspirin, Multiple Minerals-Vitamins (CITRACAL PLUS PO), trospium, calcium carbonate, cetirizine, cycloSPORINE, amLODipine, Cholecalciferol (VITAMIN D PO), ranitidine, metoprolol succinate, omeprazole, valsartan, metoprolol succinate, Vitamin D (Ergocalciferol), triamterene-hydrochlorothiazide, and atorvastatin.  Meds ordered this encounter  Medications  . atorvastatin (LIPITOR) 10 MG tablet    Sig: Take 1 tablet (10 mg total) by mouth daily at 6 PM.    Dispense:  90 tablet    Refill:  2  . tiZANidine (ZANAFLEX) 2 MG tablet    Sig: Take 1-2 tablets (2-4 mg total) by mouth at bedtime as needed for muscle spasms.    Dispense:  30 tablet    Refill:  0    CMA served as scribe during this visit.  History, Physical and Plan performed by medical provider. Documentation and orders reviewed and attested to.  Penni Homans, MD

## 2017-07-31 NOTE — Assessment & Plan Note (Addendum)
Encouraged moist heat and gentle stretching as tolerated. May try NSAIDs and prescription meds as directed and report if symptoms worsen or seek immediate care. Some scoliosis noted on exam. Check xrays shows kyphosis

## 2017-07-31 NOTE — Assessment & Plan Note (Signed)
Encouraged DASH diet, decrease po intake and increase exercise as tolerated. Needs 7-8 hours of sleep nightly. Avoid trans fats, eat small, frequent meals every 4-5 hours with lean proteins, complex carbs and healthy fats. Minimize simple carbs 

## 2017-07-31 NOTE — Assessment & Plan Note (Signed)
Labs reveal deficiency. Start on Vitamin D 50000 IU caps, 1 cap po weekly x 12 weeks. Disp #4 with 4 rf. Also take daily Vitamin D over the counter. If already taking a daily supplement increase by 1000 IU daily and if not start Vitamin D 2000 IU daily. She is dealing well with the supplements

## 2017-07-31 NOTE — Assessment & Plan Note (Signed)
Somehow missed the Atorvastatin for 3 months and just realized it and restarted it a week ago.

## 2017-07-31 NOTE — Assessment & Plan Note (Signed)
Well controlled, no changes to meds. Encouraged heart healthy diet such as the DASH diet and exercise as tolerated.  °

## 2017-07-31 NOTE — Assessment & Plan Note (Signed)
May be improving slightly. No changes

## 2017-07-31 NOTE — Patient Instructions (Addendum)
Shingrix is the new shingles shot, 2 shots over 2-6 months at [facility], Adult Ticks are insects that draw blood for food. Most ticks live in shrubs and grassy areas. They climb onto people and animals that brush against the leaves and grasses that they rest on. Then they bite, attaching themselves to the skin. Most ticks are harmless, but some ticks carry germs that can spread to a person through a bite and cause a disease. To reduce your risk of getting a disease from a tick bite, it is important to take steps to prevent tick bites. It is also important to check for ticks after being outdoors. If you find that a tick has attached to you, watch for symptoms of disease. How can I prevent tick bites? Take these steps to help prevent tick bites when you are outdoors in an area where ticks are found:  Use insect repellent that has DEET (20% or higher), picaridin, or IR3535 in it. Use it on: ? Skin that is showing. ? The top of your boots. ? Your pant legs. ? Your sleeve cuffs.  For repellent products that contain permethrin, follow product instructions. Use these products on: ? Clothing. ? Gear. ? Boots. ? Tents.  Wear protective clothing. Long sleeves and long pants offer the best protection from ticks.  Wear light-colored clothing so you can see ticks more easily.  Tuck your pant legs into your socks.  If you go walking on a trail, stay in the middle of the trail so your skin, hair, and clothing do not touch the bushes.  Avoid walking through areas with long grass.  Check for ticks on your clothing, hair, and skin often while you are outside, and check again before you go inside. Make sure to check the places that ticks attach themselves most often. These places include the scalp, neck, armpits, waist, groin, and joint areas. Ticks that carry a disease called Lyme disease have to be attached to the skin for 24-48 hours. Checking for ticks every day will lessen your  risk of this and other diseases.  When you come indoors, wash your clothes and take a shower or a bath right away. Dry your clothes in a dryer on high heat for at least 60 minutes. This will kill any ticks in your clothes.  What is the proper way to remove a tick? If you find a tick on your body, remove it as soon as possible. Removing a tick sooner rather than later can prevent germs from passing from the tick to your body. To remove a tick that is crawling on your skin but has not bitten:  Go outdoors and brush the tick off.  Remove the tick with tape or a lint roller.  To remove a tick that is attached to your skin:  Wash your hands.  If you have latex gloves, put them on.  Use tweezers, curved forceps, or a tick-removal tool to gently grasp the tick as close to your skin and the tick's head as possible.  Gently pull with steady, upward pressure until the tick lets go. When removing the tick: ? Take care to keep the tick's head attached to its body. ? Do not twist or jerk the tick. This can make the tick's head or mouth break off. ? Do not squeeze or crush the tick's body. This could force disease-carrying fluids from the tick into your body.  Do not try to remove a tick with heat, alcohol, petroleum  jelly, or fingernail polish. Using these methods can cause the tick to salivate and regurgitate into your bloodstream, increasing your risk of getting a disease. What should I do after removing a tick?  Clean the bite area with soap and water, rubbing alcohol, or an iodine scrub.  If an antiseptic cream or ointment is available, apply a small amount to the bite site.  Wash and disinfect any instruments that you used to remove the tick. How should I dispose of a tick? To dispose of a live tick, use one of these methods:  Place it in rubbing alcohol.  Place it in a sealed bag or container.  Wrap it tightly in tape.  Flush it down the toilet.  Contact a health care provider  if:  You have symptoms of a disease after a tick bite. Symptoms of a tick-borne disease can occur from moments after the tick bites to up to 30 days after a tick is removed. Symptoms include: ? Muscle, joint, or bone pain. ? Difficulty walking or moving your legs. ? Numbness in the legs. ? Paralysis. ? Red rash around the tick bite area that is shaped like a target or a "bull's-eye." ? Redness and swelling in the area of the tick bite. ? Fever. ? Repeated vomiting. ? Diarrhea. ? Weight loss. ? Tender, swollen lymph glands. ? Shortness of breath. ? Cough. ? Pain in the abdomen. ? Headache. ? Abnormal tiredness. ? A change in your level of consciousness. ? Confusion. Get help right away if:  You are not able to remove a tick.  A part of a tick breaks off and gets stuck in your skin.  Your symptoms get worse. Summary  Ticks may carry germs that can spread to a person through a bite and cause disease.  Wear protective clothing and use insect repellent to prevent tick bites. Follow product instructions.  If you find a tick on your body, remove it as soon as possible. If the tick is attached, do not try to remove with heat, alcohol, petroleum jelly, or fingernail polish.  Remove the attached tick using tweezers, curved forceps, or a tick-removal tool. Gently pull with steady, upward pressure until the tick lets go. Do not twist or jerk the tick. Do not squeeze or crush the tick's body.  If you have symptoms after being bitten by a tick, contact a health care provider. This information is not intended to replace advice given to you by your health care provider. Make sure you discuss any questions you have with your health care provider. Document Released: 02/18/2000 Document Revised: 12/03/2015 Document Reviewed: 12/03/2015 Elsevier Interactive Patient Education  [employer].

## 2017-07-31 NOTE — Assessment & Plan Note (Signed)
On abdomen with rash and she reports it initially had a halo now does not. Notes fatigue and increased myalgias was working in yard in pine straw. Check labs

## 2017-08-04 LAB — EHRLICHIA ANTIBODY PANEL: E. CHAFFEENSIS AB IGG: <1:64 {titer}

## 2017-08-04 LAB — TEST AUTHORIZATION

## 2017-08-04 LAB — B. BURGDORFI ANTIBODIES: B burgdorferi Ab IgG+IgM: <0.9 index

## 2017-08-04 LAB — ROCKY MTN SPOTTED FVR ABS PNL(IGG+IGM)
RMSF IGM: NOT DETECTED
RMSF IgG: NOT DETECTED

## 2017-08-05 ENCOUNTER — Other Ambulatory Visit: Payer: Self-pay | Admitting: Family Medicine

## 2017-08-07 ENCOUNTER — Ambulatory Visit: Payer: PPO | Admitting: *Deleted

## 2017-08-08 ENCOUNTER — Ambulatory Visit: Payer: PPO | Admitting: *Deleted

## 2017-08-17 ENCOUNTER — Other Ambulatory Visit: Payer: Self-pay | Admitting: Family Medicine

## 2017-10-19 ENCOUNTER — Other Ambulatory Visit: Payer: Self-pay | Admitting: Family Medicine

## 2017-10-29 NOTE — Progress Notes (Addendum)
Subjective:   Erika [name] is a 72 y.o. female who presents for Medicare Annual (Subsequent) preventive examination.  Review of Systems:  No ROS.  Medicare Wellness Visit. Additional risk factors are reflected in the social history. Cardiac Risk Factors include: advanced age (>5men, >37 women);dyslipidemia;hypertension Sleep patterns:  No issues Home Safety/Smoke Alarms: Feels safe in home. Smoke alarms in place. Split level home. Lives with husband. Step over tub. Grab bars.  Female:     Mammo- pt states she will schedule with Dr.Holland      Dexa scan- utd       CCS-due 2021     Objective:     Vitals: BP (!) 160/78 (BP Location: Right Arm, Patient Position: Sitting, Cuff Size: Normal)   Pulse (!) 57   Ht 5\' 4"  (1.626 m)   Wt 178 lb 6.4 oz (80.9 kg)   SpO2 98%   BMI 30.62 kg/m   Body mass index is 30.62 kg/m.  Advanced Directives 10/30/2017 08/07/2016  Does Patient Have a Medical Advance Directive? Yes Yes  Type of Paramedic of [place];Living will [place];Living will  Copy of [place] in Chart? No - copy requested No - copy requested    Tobacco Social History   Tobacco Use  Smoking Status Never Smoker  Smokeless Tobacco Never Used     Counseling given: Not Answered   Clinical Intake: Pain : No/denies pain   Past Medical History:  Diagnosis Date  . Allergic state 06/24/2012  . Anterior cervical lymphadenopathy 02/08/2013  . Arthritis 04/02/2011  . Bronchitis 04/02/2011  . Chicken pox as a child  . Diverticulosis of colon    colonoscopy 04/14/1999  . GERD (gastroesophageal reflux disease)   . History of fibrocystic disease of breast   . Hyperlipidemia, mixed 02/20/2007   Followed as Primary Care Patient/ [facility]/ [facility]  Goal LDL < 130 HBP/Pos fm hx      . Hypertension   . Hypokalemia 06/24/2012  . Hyponatremia 08/02/2014  . IBS (irritable bowel syndrome) 04/02/2011  . Measles as a  child  . Mumps as a child  . Obesity   . Osteopenia   . Overactive bladder 04/02/2011  . Overweight   . Preventative health care 03/13/2012  . Shoulder pain, bilateral 06/14/2011   Past Surgical History:  Procedure Laterality Date  . BREAST REDUCTION SURGERY  1986  . BUNIONECTOMY Bilateral 1980  . CATARACT EXTRACTION, BILATERAL    . HAMMER TOE SURGERY Right 12-03-13   foot  . MOUTH SURGERY  01-10-13   negative  . TONSILLECTOMY     Family History  Problem Relation Age of Onset  . Stroke Mother 41  . Hyperlipidemia Mother   . Hypertension Mother   . Heart attack Father 70  . Hypertension Father   . Heart disease Father   . Hypertension Brother   . Aneurysm Brother        aortic  . Hyperlipidemia Brother   . Heart disease Brother        aortic aneurysm rupture  . Multiple sclerosis Daughter   . Alcohol abuse Son   . Schizophrenia Son   . Bipolar disorder Son   . Mental illness Son        schizophrenic, bipolar   Social History   Socioeconomic History  . Marital status: Married    Spouse name: Not on file  . Number of children: 4  . Years of education: Not on  file  . Highest education level: Not on file  Occupational History  . Occupation: dental hygenist  Social Needs  . Financial resource strain: Not on file  . Food insecurity:    Worry: Not on file    Inability: Not on file  . Transportation needs:    Medical: Not on file    Non-medical: Not on file  Tobacco Use  . Smoking status: Never Smoker  . Smokeless tobacco: Never Used  Substance and Sexual Activity  . Alcohol use: No  . Drug use: No  . Sexual activity: Not Currently  Lifestyle  . Physical activity:    Days per week: Not on file    Minutes per session: Not on file  . Stress: Not on file  Relationships  . Social connections:    Talks on phone: Not on file    Gets together: Not on file    Attends religious service: Not on file    Active member of club or organization: Not on file    Attends  meetings of clubs or organizations: Not on file    Relationship status: Not on file  Other Topics Concern  . Not on file  Social History Narrative   4 children 2 are adopted    Outpatient Encounter Medications as of 10/30/2017  Medication Sig  . amLODipine (NORVASC) 10 MG tablet TAKE 0.5 TABLETS (5 MG TOTAL) BY MOUTH 2 (TWO) TIMES DAILY.  Marland Kitchen aspirin 81 MG tablet Take 81 mg by mouth daily.    Marland Kitchen atorvastatin (LIPITOR) 10 MG tablet Take 1 tablet (10 mg total) by mouth daily at 6 PM.  . calcium carbonate (TUMS) 500 MG chewable tablet Chew 1 tablet by mouth as needed.   . cetirizine (ZYRTEC) 10 MG tablet Take 10 mg by mouth at bedtime as needed.    . Cholecalciferol (VITAMIN D PO) Take 1 capsule by mouth daily.  . cycloSPORINE (RESTASIS) 0.05 % ophthalmic emulsion Place 1 drop into both eyes 2 (two) times daily.  . metoprolol succinate (TOPROL-XL) 50 MG 24 hr tablet Take 1 tablet (50 mg total) by mouth 2 (two) times daily. Take with or immediately following a meal.  . Multiple Minerals-Vitamins (CITRACAL PLUS PO) Take 1 tablet by mouth daily.    Marland Kitchen omeprazole (PRILOSEC) 20 MG capsule TAKE ONE CAPSULE BY MOUTH EVERY DAY  . ranitidine (ZANTAC) 300 MG tablet TAKE 1 TABLET (300 MG TOTAL) BY MOUTH AT BEDTIME.  Marland Kitchen triamterene-hydrochlorothiazide (MAXZIDE-25) 37.5-25 MG tablet TAKE 1 TABLET BY MOUTH DAILY.  . trospium (SANCTURA) 20 MG tablet Take 20 mg by mouth daily.    . valsartan (DIOVAN) 320 MG tablet TAKE 1 TABLET BY MOUTH EVERY DAY  . Vitamin D, Ergocalciferol, (DRISDOL) 50000 units CAPS capsule TAKE 1 CAPSULE (50,000 UNITS TOTAL) BY MOUTH EVERY 7 (SEVEN) DAYS.  Marland Kitchen metoprolol succinate (TOPROL-XL) 25 MG 24 hr tablet Take 1 tablet (25 mg total) by mouth 2 (two) times daily. (Patient not taking: Reported on 10/30/2017)  . tiZANidine (ZANAFLEX) 2 MG tablet Take 1-2 tablets (2-4 mg total) by mouth at bedtime as needed for muscle spasms. (Patient not taking: Reported on 10/30/2017)   No  facility-administered encounter medications on file as of 10/30/2017.     Activities of Daily Living In your present state of health, do you have any difficulty performing the following activities: 10/30/2017  Hearing? N  Vision? N  Difficulty concentrating or making decisions? N  Walking or climbing stairs? N  Dressing or bathing? N  Doing errands, shopping? N  Preparing Food and eating ? N  Using the Toilet? N  In the past six months, have you accidently leaked urine? N  Do you have problems with loss of bowel control? N  Managing your Medications? N  Managing your Finances? N  Housekeeping or managing your Housekeeping? N  Some recent data might be hidden    Patient Care Team: [name], MD as PCP - General (Family Medicine) Molli Posey, MD as Consulting Physician (Obstetrics and Gynecology) Marica Otter, [place] as Consulting Physician (Optometry) Mcarthur Rossetti, MD as Consulting Physician (Orthopedic Surgery) Renelda Mom, DDS as Consulting Physician (Dentistry)    Assessment:   This is a routine wellness examination for Margarita. Physical assessment deferred to PCP.  Exercise Activities and Dietary recommendations Current Exercise Habits: Structured exercise class, Time (Minutes): 60, Frequency (Times/Week): 1, Weekly Exercise (Minutes/Week): 60, Intensity: Mild, Exercise limited by: None identified Diet (meal preparation, eat out, water intake, caffeinated beverages, dairy products, fruits and vegetables): Pt repots she has been eating out a lot and feels like that is causing her to get too much sodium. Reports she also needs to drink more water.    Goals    . Weight (lb) < 170 lb (77.1 kg)       Fall Risk Fall Risk  10/30/2017 08/07/2016 07/24/2014 02/08/2013 02/06/2013  Falls in the past year? No No No Yes No  Number falls in past yr: - - - 1 -  Injury with Fall? - - - Yes -    Depression Screen PHQ 2/9 Scores 10/30/2017 08/07/2016 07/24/2014 02/08/2013    PHQ - 2 Score 0 1 0 0     Cognitive Function MMSE - Mini Mental State Exam 10/30/2017 08/07/2016  Orientation to time 5 5  Orientation to Place 5 5  Registration 3 3  Attention/ Calculation 5 5  Recall 1 3  Language- name 2 objects 2 2  Language- repeat 1 1  Language- follow 3 step command 3 3  Language- read & follow direction 1 1  Write a sentence 1 1  Copy design 1 1  Total score 28 30        Immunization History  Administered Date(s) Administered  . Influenza Whole 12/05/2010, 12/04/2012  . Influenza, High Dose Seasonal PF 11/16/2016  . Influenza,inj,Quad PF,6+ Mos 01/12/2014  . Influenza-Unspecified 12/30/2014  . Pneumococcal Conjugate-13 01/13/2014  . Pneumococcal Polysaccharide-23 07/22/2008, 12/05/2010  . Td 03/06/2010  . Zoster 02/17/2014   Screening Tests Health Maintenance  Topic Date Due  . INFLUENZA VACCINE  11/29/2017 (Originally 10/04/2017)  . MAMMOGRAM  11/10/2018  . COLONOSCOPY  05/13/2019  . TETANUS/TDAP  03/06/2020  . DEXA SCAN  Completed  . Hepatitis C Screening  Completed  . PNA vac Low Risk Adult  Completed      Plan:    Follow up with Dr.Blyth as scheduled  Please schedule your next medicare wellness visit with me in 1 yr.  Continue to eat heart healthy diet (full of fruits, vegetables, whole grains, lean protein, water--limit salt, fat, and sugar intake) and increase physical activity as tolerated.  Continue doing brain stimulating activities (puzzles, reading, adult coloring books, staying active) to keep memory sharp.   Please schedule mammogram as discussed. I have personally reviewed and noted the following in the patient's chart:   . Medical and social history . Use of alcohol, tobacco or illicit drugs  . Current medications and supplements . Functional ability and status . Nutritional  status . Physical activity . Advanced directives . List of other physicians . Hospitalizations, surgeries, and ER visits in previous 12  months . Vitals . Screenings to include cognitive, depression, and falls . Referrals and appointments  In addition, I have reviewed and discussed with patient certain preventive protocols, quality metrics, and best practice recommendations. A written personalized care plan for preventive services as well as general preventive health recommendations were provided to patient.     Shela Nevin, [place]  10/30/2017  Medical screening examination/treatment was performed by qualified clinical staff member and as supervising physician I was immediately available for consultation/collaboration. I have reviewed documentation and agree with assessment and plan.  Penni Homans, MD

## 2017-10-30 ENCOUNTER — Encounter: Payer: Self-pay | Admitting: Family Medicine

## 2017-10-30 ENCOUNTER — Encounter: Payer: Self-pay | Admitting: *Deleted

## 2017-10-30 ENCOUNTER — Ambulatory Visit (INDEPENDENT_AMBULATORY_CARE_PROVIDER_SITE_OTHER): Payer: PPO | Admitting: Family Medicine

## 2017-10-30 ENCOUNTER — Ambulatory Visit (INDEPENDENT_AMBULATORY_CARE_PROVIDER_SITE_OTHER): Payer: PPO | Admitting: *Deleted

## 2017-10-30 VITALS — BP 160/78 | HR 57 | Ht 64.0 in | Wt 178.4 lb

## 2017-10-30 VITALS — BP 162/72 | HR 57 | Temp 98.6°F | Resp 18 | Ht 64.0 in | Wt 178.6 lb

## 2017-10-30 DIAGNOSIS — R7989 Other specified abnormal findings of blood chemistry: Secondary | ICD-10-CM | POA: Diagnosis not present

## 2017-10-30 DIAGNOSIS — R05 Cough: Secondary | ICD-10-CM | POA: Diagnosis not present

## 2017-10-30 DIAGNOSIS — E782 Mixed hyperlipidemia: Secondary | ICD-10-CM | POA: Diagnosis not present

## 2017-10-30 DIAGNOSIS — R059 Cough, unspecified: Secondary | ICD-10-CM

## 2017-10-30 DIAGNOSIS — R5383 Other fatigue: Secondary | ICD-10-CM | POA: Diagnosis not present

## 2017-10-30 DIAGNOSIS — Z0000 Encounter for general adult medical examination without abnormal findings: Secondary | ICD-10-CM | POA: Diagnosis not present

## 2017-10-30 DIAGNOSIS — I10 Essential (primary) hypertension: Secondary | ICD-10-CM

## 2017-10-30 DIAGNOSIS — M8580 Other specified disorders of bone density and structure, unspecified site: Secondary | ICD-10-CM | POA: Diagnosis not present

## 2017-10-30 DIAGNOSIS — R110 Nausea: Secondary | ICD-10-CM | POA: Diagnosis not present

## 2017-10-30 LAB — CBC WITH DIFFERENTIAL/PLATELET
Basophils Absolute: 0.1 10*3/uL (ref 0.0–0.1)
Basophils Relative: 2.1 % (ref 0.0–3.0)
EOS PCT: 4.6 % (ref 0.0–5.0)
Eosinophils Absolute: 0.3 10*3/uL (ref 0.0–0.7)
HEMATOCRIT: 36.1 % (ref 36.0–46.0)
Hemoglobin: 12 g/dL (ref 12.0–15.0)
LYMPHS PCT: 32.6 % (ref 12.0–46.0)
Lymphs Abs: 2 10*3/uL (ref 0.7–4.0)
MCHC: 33.3 g/dL (ref 30.0–36.0)
MCV: 81.5 fl (ref 78.0–100.0)
MONOS PCT: 12.3 % — AB (ref 3.0–12.0)
Monocytes Absolute: 0.7 10*3/uL (ref 0.1–1.0)
Neutro Abs: 2.9 10*3/uL (ref 1.4–7.7)
Neutrophils Relative %: 48.4 % (ref 43.0–77.0)
Platelets: 309 10*3/uL (ref 150.0–400.0)
RBC: 4.43 Mil/uL (ref 3.87–5.11)
RDW: 16.2 % — ABNORMAL HIGH (ref 11.5–15.5)
WBC: 6 10*3/uL (ref 4.0–10.5)

## 2017-10-30 LAB — COMPREHENSIVE METABOLIC PANEL
ALBUMIN: 4.3 g/dL (ref 3.5–5.2)
ALT: 16 U/L (ref 0–35)
AST: 18 U/L (ref 0–37)
Alkaline Phosphatase: 86 U/L (ref 39–117)
BUN: 13 mg/dL (ref 6–23)
CO2: 30 mEq/L (ref 19–32)
Calcium: 10.2 mg/dL (ref 8.4–10.5)
Chloride: 95 mEq/L — ABNORMAL LOW (ref 96–112)
Creatinine, Ser: 1.12 mg/dL (ref 0.40–1.20)
GFR: 50.85 mL/min — AB (ref >60.00)
Glucose, Bld: 93 mg/dL (ref 70–99)
POTASSIUM: 4 meq/L (ref 3.5–5.1)
SODIUM: 131 meq/L — AB (ref 135–145)
Total Bilirubin: 0.7 mg/dL (ref 0.2–1.2)
Total Protein: 7.5 g/dL (ref 6.0–8.3)

## 2017-10-30 LAB — LIPID PANEL
Cholesterol: 165 mg/dL (ref 0–200)
HDL: 44.6 mg/dL (ref >39.00)
LDL Cholesterol: 93 mg/dL (ref 0–99)
NonHDL: 119.95
Total CHOL/HDL Ratio: 4
Triglycerides: 136 mg/dL (ref 0.0–149.0)
VLDL: 27.2 mg/dL (ref 0.0–40.0)

## 2017-10-30 LAB — TSH: TSH: 1.14 u[IU]/mL (ref 0.35–4.50)

## 2017-10-30 LAB — T4, FREE: FREE T4: 1.06 ng/dL (ref 0.60–1.60)

## 2017-10-30 MED ORDER — TRIAMTERENE-HCTZ 37.5-25 MG PO TABS: 1.0000 | ORAL_TABLET | Freq: Every day | ORAL | 1 refills | Status: DC

## 2017-10-30 NOTE — Assessment & Plan Note (Signed)
Supplement and monitor 

## 2017-10-30 NOTE — Assessment & Plan Note (Signed)
Encouraged heart healthy diet, increase exercise, avoid trans fats, consider a krill oil cap daily. Tolerating Atorvastatin 

## 2017-10-30 NOTE — Progress Notes (Signed)
Subjective:  I acted as a Education administrator for Dr. Charlett Blake. [place], [place]  Patient ID: Erika [name], female    DOB: [birth year], 72 y.o.   MRN: [id number]  No chief complaint on file.   HPI  Patient is in today for a 3 month follow up over all she is doing well. No recent febrile illness or hospitalizations. She continues to be very busy and cares for many young children. She notes an occasional nagging cough as well but nonporductive and not interrupting sleep. Denies CP/palp/SOB/HA/congestion/fevers/GI or GU c/o. Taking meds as prescribed  Patient Care Team: [name], MD as PCP - General (Family Medicine) Molli Posey, MD as Consulting Physician (Obstetrics and Gynecology) Marica Otter, Naomi as Consulting Physician (Optometry) Mcarthur Rossetti, MD as Consulting Physician (Orthopedic Surgery) Renelda Mom, DDS as Consulting Physician (Dentistry)   Past Medical History:  Diagnosis Date  . Allergic state 06/24/2012  . Anterior cervical lymphadenopathy 02/08/2013  . Arthritis 04/02/2011  . Bronchitis 04/02/2011  . Chicken pox as a child  . Diverticulosis of colon    colonoscopy 04/14/1999  . GERD (gastroesophageal reflux disease)   . History of fibrocystic disease of breast   . Hyperlipidemia, mixed 02/20/2007   Followed as Primary Care Patient/ [facility]/ [facility]  Goal LDL < 130 HBP/Pos fm hx      . Hypertension   . Hypokalemia 06/24/2012  . Hyponatremia 08/02/2014  . IBS (irritable bowel syndrome) 04/02/2011  . Measles as a child  . Mumps as a child  . Obesity   . Osteopenia   . Overactive bladder 04/02/2011  . Overweight   . Preventative health care 03/13/2012  . Shoulder pain, bilateral 06/14/2011    Past Surgical History:  Procedure Laterality Date  . BREAST REDUCTION SURGERY  1986  . BUNIONECTOMY Bilateral 1980  . CATARACT EXTRACTION, BILATERAL    . HAMMER TOE SURGERY Right 12-03-13   foot  . MOUTH SURGERY  01-10-13   negative  . TONSILLECTOMY      Family  History  Problem Relation Age of Onset  . Stroke Mother 81  . Hyperlipidemia Mother   . Hypertension Mother   . Heart attack Father 31  . Hypertension Father   . Heart disease Father   . Hypertension Brother   . Aneurysm Brother        aortic  . Hyperlipidemia Brother   . Heart disease Brother        aortic aneurysm rupture  . Multiple sclerosis Daughter   . Alcohol abuse Son   . Schizophrenia Son   . Bipolar disorder Son   . Mental illness Son        schizophrenic, bipolar    Social History   Socioeconomic History  . Marital status: Married    Spouse name: Not on file  . Number of children: 4  . Years of education: Not on file  . Highest education level: Not on file  Occupational History  . Occupation: dental hygenist  Social Needs  . Financial resource strain: Not on file  . Food insecurity:    Worry: Not on file    Inability: Not on file  . Transportation needs:    Medical: Not on file    Non-medical: Not on file  Tobacco Use  . Smoking status: Never Smoker  . Smokeless tobacco: Never Used  Substance and Sexual Activity  . Alcohol use: No  . Drug use: No  . Sexual activity: Not Currently  Lifestyle  .  Physical activity:    Days per week: Not on file    Minutes per session: Not on file  . Stress: Not on file  Relationships  . Social connections:    Talks on phone: Not on file    Gets together: Not on file    Attends religious service: Not on file    Active member of club or organization: Not on file    Attends meetings of clubs or organizations: Not on file    Relationship status: Not on file  . Intimate partner violence:    Fear of current or ex partner: Not on file    Emotionally abused: Not on file    Physically abused: Not on file    Forced sexual activity: Not on file  Other Topics Concern  . Not on file  Social History Narrative   4 children 2 are adopted    Outpatient Medications Prior to Visit  Medication Sig Dispense Refill  .  amLODipine (NORVASC) 10 MG tablet TAKE 0.5 TABLETS (5 MG TOTAL) BY MOUTH 2 (TWO) TIMES DAILY. 90 tablet 1  . aspirin 81 MG tablet Take 81 mg by mouth daily.      Marland Kitchen atorvastatin (LIPITOR) 10 MG tablet Take 1 tablet (10 mg total) by mouth daily at 6 PM. 90 tablet 2  . calcium carbonate (TUMS) 500 MG chewable tablet Chew 1 tablet by mouth as needed.     . cetirizine (ZYRTEC) 10 MG tablet Take 10 mg by mouth at bedtime as needed.      . Cholecalciferol (VITAMIN D PO) Take 1 capsule by mouth daily.    . cycloSPORINE (RESTASIS) 0.05 % ophthalmic emulsion Place 1 drop into both eyes 2 (two) times daily.    . metoprolol succinate (TOPROL-XL) 50 MG 24 hr tablet Take 1 tablet (50 mg total) by mouth 2 (two) times daily. Take with or immediately following a meal. 90 tablet 0  . Multiple Minerals-Vitamins (CITRACAL PLUS PO) Take 1 tablet by mouth daily.      Marland Kitchen omeprazole (PRILOSEC) 20 MG capsule TAKE ONE CAPSULE BY MOUTH EVERY DAY 90 capsule 2  . ranitidine (ZANTAC) 300 MG tablet TAKE 1 TABLET (300 MG TOTAL) BY MOUTH AT BEDTIME. 30 tablet 2  . tiZANidine (ZANAFLEX) 2 MG tablet Take 1-2 tablets (2-4 mg total) by mouth at bedtime as needed for muscle spasms. 30 tablet 0  . trospium (SANCTURA) 20 MG tablet Take 20 mg by mouth daily.      . valsartan (DIOVAN) 320 MG tablet TAKE 1 TABLET BY MOUTH EVERY DAY 90 tablet 0  . Vitamin D, Ergocalciferol, (DRISDOL) 50000 units CAPS capsule TAKE 1 CAPSULE (50,000 UNITS TOTAL) BY MOUTH EVERY 7 (SEVEN) DAYS. 12 capsule 1  . metoprolol succinate (TOPROL-XL) 25 MG 24 hr tablet Take 1 tablet (25 mg total) by mouth 2 (two) times daily. 60 tablet 0  . triamterene-hydrochlorothiazide (MAXZIDE-25) 37.5-25 MG tablet TAKE 1 TABLET BY MOUTH DAILY. 30 tablet 0   No facility-administered medications prior to visit.     No Known Allergies  Review of Systems  Constitutional: Negative for fever and malaise/fatigue.  HENT: Negative for congestion.   Eyes: Negative for blurred vision.    Respiratory: Positive for cough. Negative for shortness of breath.   Cardiovascular: Negative for chest pain, palpitations and leg swelling.  Gastrointestinal: Negative for abdominal pain, blood in stool and nausea.  Genitourinary: Negative for dysuria and frequency.  Musculoskeletal: Negative for falls.  Skin: Negative for rash.  Neurological: Negative for dizziness, loss of consciousness and headaches.  Endo/Heme/Allergies: Negative for environmental allergies.  Psychiatric/Behavioral: Negative for depression. The patient is not nervous/anxious.        Objective:    Physical Exam  Constitutional: She is oriented to person, place, and time. She appears well-developed and well-nourished. No distress.  HENT:  Head: Normocephalic and atraumatic.  Nose: Nose normal.  Eyes: Right eye exhibits no discharge. Left eye exhibits no discharge.  Neck: Normal range of motion. Neck supple.  Cardiovascular: Normal rate and regular rhythm.  No murmur heard. Pulmonary/Chest: Effort normal and breath sounds normal.  Abdominal: Soft. Bowel sounds are normal. There is no tenderness.  Musculoskeletal: She exhibits no edema.  Neurological: She is alert and oriented to person, place, and time.  Skin: Skin is warm and dry.  Psychiatric: She has a normal mood and affect.  Nursing note and vitals reviewed.   BP (!) 162/72   Pulse (!) 57   Temp 98.6 F (37 C) (Oral)   Resp 18   Ht 5\' 4"  (1.626 m)   Wt 178 lb 9.6 oz (81 kg)   SpO2 98%   BMI 30.66 kg/m  Wt Readings from Last 3 Encounters:  10/30/17 178 lb 9.6 oz (81 kg)  10/30/17 178 lb 6.4 oz (80.9 kg)  07/31/17 180 lb (81.6 kg)   BP Readings from Last 3 Encounters:  10/30/17 (!) 162/72  10/30/17 (!) 160/78  07/31/17 (!) 140/52     Immunization History  Administered Date(s) Administered  . Influenza Whole 12/05/2010, 12/04/2012  . Influenza, High Dose Seasonal PF 11/16/2016  . Influenza,inj,Quad PF,6+ Mos 01/12/2014  .  Influenza-Unspecified 12/30/2014  . Pneumococcal Conjugate-13 01/13/2014  . Pneumococcal Polysaccharide-23 07/22/2008, 12/05/2010  . Td 03/06/2010  . Zoster 02/17/2014    Health Maintenance  Topic Date Due  . INFLUENZA VACCINE  11/29/2017 (Originally 10/04/2017)  . MAMMOGRAM  11/10/2018  . COLONOSCOPY  05/13/2019  . TETANUS/TDAP  03/06/2020  . DEXA SCAN  Completed  . Hepatitis C Screening  Completed  . PNA vac Low Risk Adult  Completed    Lab Results  Component Value Date   WBC 6.0 10/30/2017   HGB 12.0 10/30/2017   HCT 36.1 10/30/2017   PLT 309.0 10/30/2017   GLUCOSE 93 10/30/2017   CHOL 165 10/30/2017   TRIG 136.0 10/30/2017   HDL 44.60 10/30/2017   LDLDIRECT 163.0 07/24/2014   LDLCALC 93 10/30/2017   ALT 16 10/30/2017   AST 18 10/30/2017   NA 131 (L) 10/30/2017   K 4.0 10/30/2017   CL 95 (L) 10/30/2017   CREATININE 1.12 10/30/2017   BUN 13 10/30/2017   CO2 30 10/30/2017   TSH 1.14 10/30/2017    Lab Results  Component Value Date   TSH 1.14 10/30/2017   Lab Results  Component Value Date   WBC 6.0 10/30/2017   HGB 12.0 10/30/2017   HCT 36.1 10/30/2017   MCV 81.5 10/30/2017   PLT 309.0 10/30/2017   Lab Results  Component Value Date   NA 131 (L) 10/30/2017   K 4.0 10/30/2017   CO2 30 10/30/2017   GLUCOSE 93 10/30/2017   BUN 13 10/30/2017   CREATININE 1.12 10/30/2017   BILITOT 0.7 10/30/2017   ALKPHOS 86 10/30/2017   AST 18 10/30/2017   ALT 16 10/30/2017   PROT 7.5 10/30/2017   ALBUMIN 4.3 10/30/2017   CALCIUM 10.2 10/30/2017   GFR 50.85 (L) 10/30/2017   Lab Results  Component Value Date  CHOL 165 10/30/2017   Lab Results  Component Value Date   HDL 44.60 10/30/2017   Lab Results  Component Value Date   LDLCALC 93 10/30/2017   Lab Results  Component Value Date   TRIG 136.0 10/30/2017   Lab Results  Component Value Date   CHOLHDL 4 10/30/2017   No results found for: HGBA1C       Assessment & Plan:   Problem List Items Addressed  This Visit    Hyperlipidemia, mixed    Encouraged heart healthy diet, increase exercise, avoid trans fats, consider a krill oil cap daily. Tolerating Atorvastatin       Relevant Medications   triamterene-hydrochlorothiazide (MAXZIDE-25) 37.5-25 MG tablet   Other Relevant Orders   Lipid panel (Completed)   Hypertension - Primary    Not well controlled but unclear if she is taking the Maxzide.s he will go check and let us know. Check Renal US and consider referral to resistent HTN clinic if actually taking the Maxzide. Patient will monitor bp no changes to meds. Encouraged heart healthy diet such as the DASH diet and exercise as tolerated.       Relevant Medications   triamterene-hydrochlorothiazide (MAXZIDE-25) 37.5-25 MG tablet   Other Relevant Orders   CBC with Differential/Platelet (Completed)   Comprehensive metabolic panel (Completed)   TSH (Completed)   T4, free (Completed)   VAS US RENAL ARTERY DUPLEX   Osteopenia   Cough    Will increase hydration and repot if cough worsening.       Nausea without vomiting   Fatigue    Struggled with heavy fatigue for past 1.5 to 2 years as her husband has had 3 surgeries in past 2 years and caring for grandkids, a total of 5 she runs around and the last one is the kindergarten. Reviewed labs for causes and will montio. She will report worsening symptoms      Low vitamin D level    Supplement and monitor         I am having Erika [name] maintain her aspirin, Multiple Minerals-Vitamins (CITRACAL PLUS PO), trospium, calcium carbonate, cetirizine, cycloSPORINE, Cholecalciferol (VITAMIN D PO), ranitidine, metoprolol succinate, omeprazole, atorvastatin, tiZANidine, valsartan, amLODipine, Vitamin D (Ergocalciferol), and triamterene-hydrochlorothiazide.  Meds ordered this encounter  Medications  . triamterene-hydrochlorothiazide (MAXZIDE-25) 37.5-25 MG tablet    Sig: Take 1 tablet by mouth daily.    Dispense:  90 tablet    Refill:  1      CMA served as Education administrator during this visit. History, Physical and Plan performed by medical provider. Documentation and orders reviewed and attested to.  Penni Homans, MD

## 2017-10-30 NOTE — Assessment & Plan Note (Addendum)
Struggled with heavy fatigue for past 1.5 to 2 years as her husband has had 3 surgeries in past 2 years and caring for grandkids, a total of 5 she runs around and the last one is the kindergarten. Reviewed labs for causes and will montio. She will report worsening symptoms

## 2017-10-30 NOTE — Patient Instructions (Addendum)
Please go home and check and see if you are actually taking the Triamterene/hct 37.5/25 tabs 1 tab daily and let us know yes or no so we can then decide what to do next.   Cjecl your BP once or twice a week after sitting quiet for 10 minutes and let us know about elevated levels especially above 175. [employer] makes a reliable, upper arm is best Hypertension Hypertension, commonly called high blood pressure, is when the force of blood pumping through the arteries is too strong. The arteries are the blood vessels that carry blood from the heart throughout the body. Hypertension forces the heart to work harder to pump blood and may cause arteries to become narrow or stiff. Having untreated or uncontrolled hypertension can cause heart attacks, strokes, kidney disease, and other problems. A blood pressure reading consists of a higher number over a lower number. Ideally, your blood pressure should be below 120/80. The first ("top") number is called the systolic pressure. It is a measure of the pressure in your arteries as your heart beats. The second ("bottom") number is called the diastolic pressure. It is a measure of the pressure in your arteries as the heart relaxes. What are the causes? The cause of this condition is not known. What increases the risk? Some risk factors for high blood pressure are under your control. Others are not. Factors you can change  Smoking.  Having type 2 diabetes mellitus, high cholesterol, or both.  Not getting enough exercise or physical activity.  Being overweight.  Having too much fat, sugar, calories, or salt (sodium) in your diet.  Drinking too much alcohol. Factors that are difficult or impossible to change  Having chronic kidney disease.  Having a family history of high blood pressure.  Age. Risk increases with age.  Race. You may be at higher risk if you are African-American.  Gender. Men are at higher risk than women before age 53. After age  66, women are at higher risk than men.  Having obstructive sleep apnea.  Stress. What are the signs or symptoms? Extremely high blood pressure (hypertensive crisis) may cause:  Headache.  Anxiety.  Shortness of breath.  Nosebleed.  Nausea and vomiting.  Severe chest pain.  Jerky movements you cannot control (seizures).  How is this diagnosed? This condition is diagnosed by measuring your blood pressure while you are seated, with your arm resting on a surface. The cuff of the blood pressure monitor will be placed directly against the skin of your upper arm at the level of your heart. It should be measured at least twice using the same arm. Certain conditions can cause a difference in blood pressure between your right and left arms. Certain factors can cause blood pressure readings to be lower or higher than normal (elevated) for a short period of time:  When your blood pressure is higher when you are in a health care provider's office than when you are at home, this is called white coat hypertension. Most people with this condition do not need medicines.  When your blood pressure is higher at home than when you are in a health care provider's office, this is called masked hypertension. Most people with this condition may need medicines to control blood pressure.  If you have a high blood pressure reading during one visit or you have normal blood pressure with other risk factors:  You may be asked to return on a different day to have your blood pressure checked again.  You may be asked to monitor your blood pressure at home for 1 week or longer.  If you are diagnosed with hypertension, you may have other blood or imaging tests to help your health care provider understand your overall risk for other conditions. How is this treated? This condition is treated by making healthy lifestyle changes, such as eating healthy foods, exercising more, and reducing your alcohol intake. Your  health care provider may prescribe medicine if lifestyle changes are not enough to get your blood pressure under control, and if:  Your systolic blood pressure is above 130.  Your diastolic blood pressure is above 80.  Your personal target blood pressure may vary depending on your medical conditions, your age, and other factors. Follow these instructions at home: Eating and drinking  Eat a diet that is high in fiber and potassium, and low in sodium, added sugar, and fat. An example eating plan is called the DASH (Dietary Approaches to Stop Hypertension) diet. To eat this way: ? Eat plenty of fresh fruits and vegetables. Try to fill half of your plate at each meal with fruits and vegetables. ? Eat whole grains, such as whole wheat pasta, brown rice, or whole grain bread. Fill about one quarter of your plate with whole grains. ? Eat or drink low-fat dairy products, such as skim milk or low-fat yogurt. ? Avoid fatty cuts of meat, processed or cured meats, and poultry with skin. Fill about one quarter of your plate with lean proteins, such as fish, chicken without skin, beans, eggs, and tofu. ? Avoid premade and processed foods. These tend to be higher in sodium, added sugar, and fat.  Reduce your daily sodium intake. Most people with hypertension should eat less than 1,500 mg of sodium a day.  Limit alcohol intake to no more than 1 drink a day for nonpregnant women and 2 drinks a day for men. One drink equals 12 oz of beer, 5 oz of wine, or 1 oz of hard liquor. Lifestyle  Work with your health care provider to maintain a healthy body weight or to lose weight. Ask what an ideal weight is for you.  Get at least 30 minutes of exercise that causes your heart to beat faster (aerobic exercise) most days of the week. Activities may include walking, swimming, or biking.  Include exercise to strengthen your muscles (resistance exercise), such as pilates or lifting weights, as part of your weekly  exercise routine. Try to do these types of exercises for 30 minutes at least 3 days a week.  Do not use any products that contain nicotine or tobacco, such as cigarettes and e-cigarettes. If you need help quitting, ask your health care provider.  Monitor your blood pressure at home as told by your health care provider.  Keep all follow-up visits as told by your health care provider. This is important. Medicines  Take over-the-counter and prescription medicines only as told by your health care provider. Follow directions carefully. Blood pressure medicines must be taken as prescribed.  Do not skip doses of blood pressure medicine. Doing this puts you at risk for problems and can make the medicine less effective.  Ask your health care provider about side effects or reactions to medicines that you should watch for. Contact a health care provider if:  You think you are having a reaction to a medicine you are taking.  You have headaches that keep coming back (recurring).  You feel dizzy.  You have swelling in your ankles.  You have trouble with your vision. Get help right away if:  You develop a severe headache or confusion.  You have unusual weakness or numbness.  You feel faint.  You have severe pain in your chest or abdomen.  You vomit repeatedly.  You have trouble breathing. Summary  Hypertension is when the force of blood pumping through your arteries is too strong. If this condition is not controlled, it may put you at risk for serious complications.  Your personal target blood pressure may vary depending on your medical conditions, your age, and other factors. For most people, a normal blood pressure is less than 120/80.  Hypertension is treated with lifestyle changes, medicines, or a combination of both. Lifestyle changes include weight loss, eating a healthy, low-sodium diet, exercising more, and limiting alcohol. This information is not intended to replace advice  given to you by your health care provider. Make sure you discuss any questions you have with your health care provider. Document Released: 02/20/2005 Document Revised: 01/19/2016 Document Reviewed: 01/19/2016 Elsevier Interactive Patient Education  [employer].

## 2017-10-30 NOTE — Patient Instructions (Signed)
Follow up with Dr.Blyth as scheduled  Please schedule your next medicare wellness visit with me in 1 yr.  Continue to eat heart healthy diet (full of fruits, vegetables, whole grains, lean protein, water--limit salt, fat, and sugar intake) and increase physical activity as tolerated.  Continue doing brain stimulating activities (puzzles, reading, adult coloring books, staying active) to keep memory sharp.   Please schedule mammogram as discussed.   Erika [name] , Thank you for taking time to come for your Medicare Wellness Visit. I appreciate your ongoing commitment to your health goals. Please review the following plan we discussed and let me know if I can assist you in the future.   These are the goals we discussed: Goals    . Weight (lb) < 170 lb (77.1 kg)       This is a list of the screening recommended for you and due dates:  Health Maintenance  Topic Date Due  . Flu Shot  11/29/2017*  . Mammogram  11/10/2018  . Colon Cancer Screening  05/13/2019  . Tetanus Vaccine  03/06/2020  . DEXA scan (bone density measurement)  Completed  .  Hepatitis C: One time screening is recommended by Center for Disease Control  (CDC) for  adults born from [birth year] through 1965.   Completed  . Pneumonia vaccines  Completed  *Topic was postponed. The date shown is not the original due date.    Health Maintenance for Postmenopausal Women Menopause is a normal process in which your reproductive ability comes to an end. This process happens gradually over a span of months to years, usually between the ages of 50 and 40. Menopause is complete when you have missed 12 consecutive menstrual periods. It is important to talk with your health care provider about some of the most common conditions that affect postmenopausal women, such as heart disease, cancer, and bone loss (osteoporosis). Adopting a healthy lifestyle and getting preventive care can help to promote your health and wellness. Those actions can also  lower your chances of developing some of these common conditions. What should I know about menopause? During menopause, you may experience a number of symptoms, such as:  Moderate-to-severe hot flashes.  Night sweats.  Decrease in sex drive.  Mood swings.  Headaches.  Tiredness.  Irritability.  Memory problems.  Insomnia.  Choosing to treat or not to treat menopausal changes is an individual decision that you make with your health care provider. What should I know about hormone replacement therapy and supplements? Hormone therapy products are effective for treating symptoms that are associated with menopause, such as hot flashes and night sweats. Hormone replacement carries certain risks, especially as you become older. If you are thinking about using estrogen or estrogen with progestin treatments, discuss the benefits and risks with your health care provider. What should I know about heart disease and stroke? Heart disease, heart attack, and stroke become more likely as you age. This may be due, in part, to the hormonal changes that your body experiences during menopause. These can affect how your body processes dietary fats, triglycerides, and cholesterol. Heart attack and stroke are both medical emergencies. There are many things that you can do to help prevent heart disease and stroke:  Have your blood pressure checked at least every 1-2 years. High blood pressure causes heart disease and increases the risk of stroke.  If you are 55-54 years old, ask your health care provider if you should take aspirin to prevent a heart attack or a  stroke.  Do not use any tobacco products, including cigarettes, chewing tobacco, or electronic cigarettes. If you need help quitting, ask your health care provider.  It is important to eat a healthy diet and maintain a healthy weight. ? Be sure to include plenty of vegetables, fruits, low-fat dairy products, and lean protein. ? Avoid eating foods  that are high in solid fats, added sugars, or salt (sodium).  Get regular exercise. This is one of the most important things that you can do for your health. ? Try to exercise for at least 150 minutes each week. The type of exercise that you do should increase your heart rate and make you sweat. This is known as moderate-intensity exercise. ? Try to do strengthening exercises at least twice each week. Do these in addition to the moderate-intensity exercise.  Know your numbers.Ask your health care provider to check your cholesterol and your blood glucose. Continue to have your blood tested as directed by your health care provider.  What should I know about cancer screening? There are several types of cancer. Take the following steps to reduce your risk and to catch any cancer development as early as possible. Breast Cancer  Practice breast self-awareness. ? This means understanding how your breasts normally appear and feel. ? It also means doing regular breast self-exams. Let your health care provider know about any changes, no matter how small.  If you are 89 or older, have a clinician do a breast exam (clinical breast exam or CBE) every year. Depending on your age, family history, and medical history, it may be recommended that you also have a yearly breast X-ray (mammogram).  If you have a family history of breast cancer, talk with your health care provider about genetic screening.  If you are at high risk for breast cancer, talk with your health care provider about having an MRI and a mammogram every year.  Breast cancer (BRCA) gene test is recommended for women who have family members with BRCA-related cancers. Results of the assessment will determine the need for genetic counseling and BRCA1 and for BRCA2 testing. BRCA-related cancers include these types: ? Breast. This occurs in males or females. ? Ovarian. ? Tubal. This may also be called fallopian tube cancer. ? Cancer of the  abdominal or pelvic lining (peritoneal cancer). ? Prostate. ? Pancreatic.  Cervical, Uterine, and Ovarian Cancer Your health care provider may recommend that you be screened regularly for cancer of the pelvic organs. These include your ovaries, uterus, and vagina. This screening involves a pelvic exam, which includes checking for microscopic changes to the surface of your cervix (Pap test).  For women ages 21-65, health care providers may recommend a pelvic exam and a Pap test every three years. For women ages 36-65, they may recommend the Pap test and pelvic exam, combined with testing for human papilloma virus (HPV), every five years. Some types of HPV increase your risk of cervical cancer. Testing for HPV may also be done on women of any age who have unclear Pap test results.  Other health care providers may not recommend any screening for nonpregnant women who are considered low risk for pelvic cancer and have no symptoms. Ask your health care provider if a screening pelvic exam is right for you.  If you have had past treatment for cervical cancer or a condition that could lead to cancer, you need Pap tests and screening for cancer for at least 20 years after your treatment. If Pap tests have  been discontinued for you, your risk factors (such as having a new sexual partner) need to be reassessed to determine if you should start having screenings again. Some women have medical problems that increase the chance of getting cervical cancer. In these cases, your health care provider may recommend that you have screening and Pap tests more often.  If you have a family history of uterine cancer or ovarian cancer, talk with your health care provider about genetic screening.  If you have vaginal bleeding after reaching menopause, tell your health care provider.  There are currently no reliable tests available to screen for ovarian cancer.  Lung Cancer Lung cancer screening is recommended for adults  90-65 years old who are at high risk for lung cancer because of a history of smoking. A yearly low-dose CT scan of the lungs is recommended if you:  Currently smoke.  Have a history of at least 30 pack-years of smoking and you currently smoke or have quit within the past 15 years. A pack-year is smoking an average of one pack of cigarettes per day for one year.  Yearly screening should:  Continue until it has been 15 years since you quit.  Stop if you develop a health problem that would prevent you from having lung cancer treatment.  Colorectal Cancer  This type of cancer can be detected and can often be prevented.  Routine colorectal cancer screening usually begins at age 1 and continues through age 59.  If you have risk factors for colon cancer, your health care provider may recommend that you be screened at an earlier age.  If you have a family history of colorectal cancer, talk with your health care provider about genetic screening.  Your health care provider may also recommend using home test kits to check for hidden blood in your stool.  A small camera at the end of a tube can be used to examine your colon directly (sigmoidoscopy or colonoscopy). This is done to check for the earliest forms of colorectal cancer.  Direct examination of the colon should be repeated every 5-10 years until age 72. However, if early forms of precancerous polyps or small growths are found or if you have a family history or genetic risk for colorectal cancer, you may need to be screened more often.  Skin Cancer  Check your skin from head to toe regularly.  Monitor any moles. Be sure to tell your health care provider: ? About any new moles or changes in moles, especially if there is a change in a mole's shape or color. ? If you have a mole that is larger than the size of a pencil eraser.  If any of your family members has a history of skin cancer, especially at a young age, talk with your health  care provider about genetic screening.  Always use sunscreen. Apply sunscreen liberally and repeatedly throughout the day.  Whenever you are outside, protect yourself by wearing long sleeves, pants, a wide-brimmed hat, and sunglasses.  What should I know about osteoporosis? Osteoporosis is a condition in which bone destruction happens more quickly than new bone creation. After menopause, you may be at an increased risk for osteoporosis. To help prevent osteoporosis or the bone fractures that can happen because of osteoporosis, the following is recommended:  If you are 75-53 years old, get at least 1,000 mg of calcium and at least 600 mg of vitamin D per day.  If you are older than age 27 but younger than age  70, get at least 1,200 mg of calcium and at least 600 mg of vitamin D per day.  If you are older than age 68, get at least 1,200 mg of calcium and at least 800 mg of vitamin D per day.  Smoking and excessive alcohol intake increase the risk of osteoporosis. Eat foods that are rich in calcium and vitamin D, and do weight-bearing exercises several times each week as directed by your health care provider. What should I know about how menopause affects my mental health? Depression may occur at any age, but it is more common as you become older. Common symptoms of depression include:  Low or sad mood.  Changes in sleep patterns.  Changes in appetite or eating patterns.  Feeling an overall lack of motivation or enjoyment of activities that you previously enjoyed.  Frequent crying spells.  Talk with your health care provider if you think that you are experiencing depression. What should I know about immunizations? It is important that you get and maintain your immunizations. These include:  Tetanus, diphtheria, and pertussis (Tdap) booster vaccine.  Influenza every year before the flu season begins.  Pneumonia vaccine.  Shingles vaccine.  Your health care provider may also  recommend other immunizations. This information is not intended to replace advice given to you by your health care provider. Make sure you discuss any questions you have with your health care provider. Document Released: 04/14/2005 Document Revised: 09/10/2015 Document Reviewed: 11/24/2014 Elsevier Interactive Patient Education  2018 [employer].

## 2017-10-30 NOTE — Assessment & Plan Note (Addendum)
Not well controlled but unclear if she is taking the Maxzide.s he will go check and let us know. Check Renal US and consider referral to resistent HTN clinic if actually taking the Maxzide. Patient will monitor bp no changes to meds. Encouraged heart healthy diet such as the DASH diet and exercise as tolerated.

## 2017-11-01 ENCOUNTER — Telehealth: Payer: Self-pay

## 2017-11-01 DIAGNOSIS — I774 Celiac artery compression syndrome: Secondary | ICD-10-CM

## 2017-11-01 DIAGNOSIS — I771 Stricture of artery: Secondary | ICD-10-CM

## 2017-11-01 NOTE — Telephone Encounter (Signed)
Copied from [facility] [phone]. Topic: General - Other >> Nov 01, 2017  2:09 PM Synthia Innocent wrote: Reason for CRM: Patient does not need refill of triamterene-hydrochlorothiazide (MAXZIDE-25) 37.5-25 MG tablet, she found her medication.

## 2017-11-01 NOTE — Telephone Encounter (Signed)
Noted  

## 2017-11-05 NOTE — Assessment & Plan Note (Signed)
Will increase hydration and repot if cough worsening.

## 2017-11-06 ENCOUNTER — Ambulatory Visit (HOSPITAL_BASED_OUTPATIENT_CLINIC_OR_DEPARTMENT_OTHER)
Admission: RE | Admit: 2017-11-06 | Discharge: 2017-11-06 | Disposition: A | Discharge status: Home / Self Care | Payer: PPO | Source: Ambulatory Visit | Attending: Family Medicine | Admitting: Family Medicine

## 2017-11-06 DIAGNOSIS — I10 Essential (primary) hypertension: Secondary | ICD-10-CM | POA: Diagnosis not present

## 2017-11-06 NOTE — Progress Notes (Signed)
Renal doppler duplex performed     FINAL INTERPRETATION: Renal:  Right: Normal size right kidney. No evidence of right renal artery    stenosis. Left: Normal size of left kidney. No evidence of left renal artery    stenosis. 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   11/06/17 Diagnosing physician: [name] MD/

## 2017-11-09 NOTE — Addendum Note (Signed)
Addended by: Magdalene Molly A on: 11/09/2017 04:25 PM   Modules accepted: Orders

## 2017-12-18 ENCOUNTER — Other Ambulatory Visit: Payer: Self-pay | Admitting: Vascular Surgery

## 2017-12-18 ENCOUNTER — Ambulatory Visit: Payer: PPO | Admitting: Vascular Surgery

## 2017-12-18 ENCOUNTER — Other Ambulatory Visit: Payer: Self-pay

## 2017-12-18 ENCOUNTER — Ambulatory Visit (HOSPITAL_COMMUNITY)
Admission: RE | Admit: 2017-12-18 | Discharge: 2017-12-18 | Disposition: A | Discharge status: Home / Self Care | Payer: PPO | Source: Ambulatory Visit | Attending: Vascular Surgery | Admitting: Vascular Surgery

## 2017-12-18 ENCOUNTER — Encounter: Payer: Self-pay | Admitting: Vascular Surgery

## 2017-12-18 VITALS — BP 168/67 | HR 55 | Temp 97.1°F | Resp 16 | Ht 66.0 in | Wt 175.0 lb

## 2017-12-18 DIAGNOSIS — I771 Stricture of artery: Secondary | ICD-10-CM | POA: Insufficient documentation

## 2017-12-18 DIAGNOSIS — R1084 Generalized abdominal pain: Secondary | ICD-10-CM

## 2017-12-18 DIAGNOSIS — I774 Celiac artery compression syndrome: Secondary | ICD-10-CM

## 2017-12-18 HISTORY — DX: Stricture of artery: I77.1

## 2017-12-18 HISTORY — DX: Celiac artery compression syndrome: I77.4

## 2017-12-18 NOTE — Progress Notes (Signed)
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  Patient was having a renal artery duplex to evaluate for renal artery stenosis given profound hypertension in the setting of 4 agents.  No renal artery stenosis was identified but there was an incidental finding of a celiac artery stenosis with a velocity of 280 consistent with greater than 70% stenosis.  Also noted 50 to 75% stenosis of the bilateral common iliac arteries. In regards to her celiac stenosis, the patient denies any postprandial abdominal pain.  She has had no weight loss and her weight has been stable.  She denies any food fear.  No previous abdominal surgeries.  She does describe these intermittent episodes approximately once a month when she gets nauseated after taking up to 11 or 12 pills at a time but it usually resolves with food. Regarding her legs she denies any lower extremity claudication and specifically denies any burning or cramping when she walks in her calves or thighs.  She has no tissue loss.  She has no rest pain at night.  She does describe some cramping in her toes that awakens her from sleep intermittently. Denies tobacco abuse.  Past Medical History:  Diagnosis Date  . Allergic state 06/24/2012  . Anterior cervical lymphadenopathy 02/08/2013  . Arthritis 04/02/2011  . Bronchitis 04/02/2011  . Chicken pox as a child  . Diverticulosis of colon    colonoscopy 04/14/1999  . GERD (gastroesophageal reflux disease)   . History of fibrocystic disease of breast   . Hyperlipidemia, mixed 02/20/2007   Followed as Primary Care Patient/ [facility]/ [place]  Goal LDL < 130 HBP/Pos fm hx      .  Hypertension   . Hypokalemia 06/24/2012  . Hyponatremia 08/02/2014  . IBS (irritable bowel syndrome) 04/02/2011  . Measles as a child  . Mumps as a child  . Obesity   . Osteopenia   . Overactive bladder 04/02/2011  . Overweight   . Preventative health care 03/13/2012  . Shoulder pain, bilateral 06/14/2011    Past Surgical History:  Procedure Laterality Date  . BREAST REDUCTION SURGERY  1986  . BUNIONECTOMY Bilateral 1980  . CATARACT EXTRACTION, BILATERAL    . HAMMER TOE SURGERY Right 12-03-13   foot  . MOUTH SURGERY  01-10-13   negative  . TONSILLECTOMY      Family History  Problem Relation Age of Onset  . Stroke Mother 41  . Hyperlipidemia Mother   . Hypertension Mother   . Heart attack Father 61  . Hypertension Father   . Heart disease Father   . Hypertension Brother   . Aneurysm Brother        aortic  . Hyperlipidemia Brother   . Heart disease Brother        aortic aneurysm rupture  . Multiple sclerosis Daughter   . Alcohol abuse Son   . Schizophrenia Son   . Bipolar disorder Son   . Mental illness Son        schizophrenic, bipolar    SOCIAL HISTORY: Social History   Socioeconomic History  . Marital status: Married    Spouse name: Not on file  . Number of children: 4  .  Years of education: Not on file  . Highest education level: Not on file  Occupational History  . Occupation: dental hygenist  Social Needs  . Financial resource strain: Not on file  . Food insecurity:    Worry: Not on file    Inability: Not on file  . Transportation needs:    Medical: Not on file    Non-medical: Not on file  Tobacco Use  . Smoking status: Never Smoker  . Smokeless tobacco: Never Used  Substance and Sexual Activity  . Alcohol use: No  . Drug use: No  . Sexual activity: Not Currently  Lifestyle  . Physical activity:    Days per week: Not on file    Minutes per session: Not on file  . Stress: Not on file  Relationships  . Social connections:    Talks on phone:  Not on file    Gets together: Not on file    Attends religious service: Not on file    Active member of club or organization: Not on file    Attends meetings of clubs or organizations: Not on file    Relationship status: Not on file  . Intimate partner violence:    Fear of current or ex partner: Not on file    Emotionally abused: Not on file    Physically abused: Not on file    Forced sexual activity: Not on file  Other Topics Concern  . Not on file  Social History Narrative   4 children 2 are adopted    No Known Allergies  Current Outpatient Medications  Medication Sig Dispense Refill  . amLODipine (NORVASC) 10 MG tablet TAKE 0.5 TABLETS (5 MG TOTAL) BY MOUTH 2 (TWO) TIMES DAILY. 90 tablet 1  . aspirin 81 MG tablet Take 81 mg by mouth daily.      Marland Kitchen atorvastatin (LIPITOR) 10 MG tablet Take 1 tablet (10 mg total) by mouth daily at 6 PM. 90 tablet 2  . calcium carbonate (TUMS) 500 MG chewable tablet Chew 1 tablet by mouth as needed.     . cetirizine (ZYRTEC) 10 MG tablet Take 10 mg by mouth at bedtime as needed.      . Cholecalciferol (VITAMIN D PO) Take 1 capsule by mouth daily.    . cycloSPORINE (RESTASIS) 0.05 % ophthalmic emulsion Place 1 drop into both eyes 2 (two) times daily.    . metoprolol succinate (TOPROL-XL) 50 MG 24 hr tablet Take 1 tablet (50 mg total) by mouth 2 (two) times daily. Take with or immediately following a meal. 90 tablet 0  . Multiple Minerals-Vitamins (CITRACAL PLUS PO) Take 1 tablet by mouth daily.      Marland Kitchen omeprazole (PRILOSEC) 20 MG capsule TAKE ONE CAPSULE BY MOUTH EVERY DAY 90 capsule 2  . ranitidine (ZANTAC) 300 MG tablet TAKE 1 TABLET (300 MG TOTAL) BY MOUTH AT BEDTIME. 30 tablet 2  . tiZANidine (ZANAFLEX) 2 MG tablet Take 1-2 tablets (2-4 mg total) by mouth at bedtime as needed for muscle spasms. 30 tablet 0  . triamterene-hydrochlorothiazide (MAXZIDE-25) 37.5-25 MG tablet Take 1 tablet by mouth daily. 90 tablet 1  . trospium (SANCTURA) 20 MG tablet  Take 20 mg by mouth daily.      . valsartan (DIOVAN) 320 MG tablet TAKE 1 TABLET BY MOUTH EVERY DAY 90 tablet 0  . Vitamin D, Ergocalciferol, (DRISDOL) 50000 units CAPS capsule TAKE 1 CAPSULE (50,000 UNITS TOTAL) BY MOUTH EVERY 7 (SEVEN) DAYS. 12 capsule 1   No  current facility-administered medications for this visit.     REVIEW OF SYSTEMS:  [X]  denotes positive finding, [ ]  denotes negative finding Cardiac  Comments:  Chest pain or chest pressure:    Shortness of breath upon exertion:    Short of breath when lying flat:    Irregular heart rhythm:        Vascular    Pain in calf, thigh, or hip brought on by ambulation:    Pain in feet at night that wakes you up from your sleep:     Blood clot in your veins:    Leg swelling:         Pulmonary    Oxygen at home:    Productive cough:     Wheezing:         Neurologic    Sudden weakness in arms or legs:     Sudden numbness in arms or legs:     Sudden onset of difficulty speaking or slurred speech:    Temporary loss of vision in one eye:     Problems with dizziness:         Gastrointestinal    Blood in stool:     Vomited blood:     Intermittent Nausea: x   Genitourinary    Burning when urinating:     Blood in urine:        Psychiatric    Major depression:         Hematologic    Bleeding problems:    Problems with blood clotting too easily:        Skin    Rashes or ulcers:        Constitutional    Fever or chills:      PHYSICAL EXAM: Vitals:   12/18/17 1005 12/18/17 1010 12/18/17 1012  BP: (!) 174/72 (!) 145/67 (!) 168/67  Pulse: (!) 56 (!) 55 (!) 55  Resp: 16    Temp: (!) 97.1 F (36.2 C)    TempSrc: Oral    SpO2: 100%    Weight: 79.4 kg    Height: 5\' 6"  (1.676 m)      GENERAL: The patient is a well-nourished female, in no acute distress. The vital signs are documented above. CARDIAC: There is a regular rate and rhythm.  VASCULAR:  2+ radial pulse palpable bilateral upper extremities 2+ femoral pulse  palpable bilateral groins 2+ DP palpable bilateral feet, no tissue loss PULMONARY: There is good air exchange bilaterally without wheezing or rales. ABDOMEN: Soft and non-tender with normal pitched bowel sounds.  MUSCULOSKELETAL: There are no major deformities or cyanosis. NEUROLOGIC: No focal weakness or paresthesias are detected. SKIN: There are no ulcers or rashes noted. PSYCHIATRIC: The patient has a normal affect.  DATA:   I independently reviewed her mesenteric duplex obtained here in clinic today: She has a greater than 70% stenosis of her proximal celiac artery with a peak systolic velocity of 563.  Her SMA and IMA appear widely patent with no elevated velocities.  Assessment/Plan:  72 year old female with incidentally noted greater than 70% celiac artery stenosis on a duplex to evaluate for renal artery stenosis as well as moderate stenosis of her bilateral common iliac arteries.   Regarding her celiac artery stenosis, I do not think she has any symptoms consistent with chronic mesenteric ischemia.  We performed a mesenteric duplex in clinic today and her SMA and IMA are widely patent.  In the absence of suggestive symptoms and only disease in one mesenteric  artery, no role for surgical intervention.  She denies food fear or weight loss and has no postprandial abdominal pain.   Regarding her common iliac stenosis again I think this is an asymptomatic incidental findings.  She has palpable femoral and pedal pulses on exam and no symptoms consistent with claudication, rest pain, or tissue loss.  Discussed that if she notes any symptoms in the future we can reevaluate at the time.     Marty Heck, MD [facility] Office: [phone] Pager: [place]

## 2017-12-18 NOTE — Progress Notes (Signed)
Vitals:   12/18/17 1005 12/18/17 1010  BP: (!) 174/72 (!) 145/67  Pulse: (!) 56 (!) 55  Resp: 16   Temp: (!) 97.1 F (36.2 C)   TempSrc: Oral   SpO2: 100%   Weight: 175 lb (79.4 kg)   Height: 5\' 6"  (1.676 m)

## 2017-12-26 ENCOUNTER — Other Ambulatory Visit: Payer: Self-pay | Admitting: Family Medicine

## 2018-02-11 ENCOUNTER — Ambulatory Visit (INDEPENDENT_AMBULATORY_CARE_PROVIDER_SITE_OTHER): Payer: PPO | Admitting: Family Medicine

## 2018-02-11 ENCOUNTER — Encounter: Payer: Self-pay | Admitting: Family Medicine

## 2018-02-11 VITALS — BP 136/62 | HR 56 | Temp 98.0°F | Resp 18 | Ht 66.0 in | Wt 176.2 lb

## 2018-02-11 DIAGNOSIS — R7989 Other specified abnormal findings of blood chemistry: Secondary | ICD-10-CM

## 2018-02-11 DIAGNOSIS — I10 Essential (primary) hypertension: Secondary | ICD-10-CM

## 2018-02-11 DIAGNOSIS — E871 Hypo-osmolality and hyponatremia: Secondary | ICD-10-CM | POA: Diagnosis not present

## 2018-02-11 DIAGNOSIS — E782 Mixed hyperlipidemia: Secondary | ICD-10-CM

## 2018-02-11 DIAGNOSIS — R519 Headache, unspecified: Secondary | ICD-10-CM

## 2018-02-11 DIAGNOSIS — M8580 Other specified disorders of bone density and structure, unspecified site: Secondary | ICD-10-CM | POA: Diagnosis not present

## 2018-02-11 DIAGNOSIS — G479 Sleep disorder, unspecified: Secondary | ICD-10-CM

## 2018-02-11 DIAGNOSIS — R531 Weakness: Secondary | ICD-10-CM

## 2018-02-11 DIAGNOSIS — M25561 Pain in right knee: Secondary | ICD-10-CM

## 2018-02-11 DIAGNOSIS — R51 Headache: Secondary | ICD-10-CM

## 2018-02-11 DIAGNOSIS — R42 Dizziness and giddiness: Secondary | ICD-10-CM | POA: Diagnosis not present

## 2018-02-11 DIAGNOSIS — R0683 Snoring: Secondary | ICD-10-CM

## 2018-02-11 DIAGNOSIS — Z89612 Acquired absence of left leg above knee: Secondary | ICD-10-CM

## 2018-02-11 DIAGNOSIS — R5383 Other fatigue: Secondary | ICD-10-CM | POA: Diagnosis not present

## 2018-02-11 DIAGNOSIS — R251 Tremor, unspecified: Secondary | ICD-10-CM

## 2018-02-11 DIAGNOSIS — G8929 Other chronic pain: Secondary | ICD-10-CM

## 2018-02-11 DIAGNOSIS — M25562 Pain in left knee: Secondary | ICD-10-CM

## 2018-02-11 DIAGNOSIS — Z89611 Acquired absence of right leg above knee: Secondary | ICD-10-CM

## 2018-02-11 HISTORY — DX: Headache, unspecified: R51.9

## 2018-02-11 LAB — COMPREHENSIVE METABOLIC PANEL
ALK PHOS: 90 U/L (ref 39–117)
ALT: 13 U/L (ref 0–35)
AST: 17 U/L (ref 0–37)
Albumin: 4.3 g/dL (ref 3.5–5.2)
BILIRUBIN TOTAL: 0.6 mg/dL (ref 0.2–1.2)
BUN: 17 mg/dL (ref 6–23)
CO2: 31 meq/L (ref 19–32)
Calcium: 10 mg/dL (ref 8.4–10.5)
Chloride: 96 mEq/L (ref 96–112)
Creatinine, Ser: 1.3 mg/dL — ABNORMAL HIGH (ref 0.40–1.20)
GFR: 42.78 mL/min — ABNORMAL LOW (ref >60.00)
GLUCOSE: 95 mg/dL (ref 70–99)
Potassium: 4.4 mEq/L (ref 3.5–5.1)
Sodium: 135 mEq/L (ref 135–145)
TOTAL PROTEIN: 7.4 g/dL (ref 6.0–8.3)

## 2018-02-11 LAB — LIPID PANEL
CHOL/HDL RATIO: 4
Cholesterol: 173 mg/dL (ref 0–200)
HDL: 41.1 mg/dL (ref >39.00)
LDL Cholesterol: 104 mg/dL — ABNORMAL HIGH (ref 0–99)
NONHDL: 131.51
Triglycerides: 138 mg/dL (ref 0.0–149.0)
VLDL: 27.6 mg/dL (ref 0.0–40.0)

## 2018-02-11 LAB — TSH: TSH: 1.09 u[IU]/mL (ref 0.35–4.50)

## 2018-02-11 LAB — CBC
HCT: 36.1 % (ref 36.0–46.0)
HEMOGLOBIN: 12.3 g/dL (ref 12.0–15.0)
MCHC: 34.1 g/dL (ref 30.0–36.0)
MCV: 82.9 fl (ref 78.0–100.0)
PLATELETS: 370 10*3/uL (ref 150.0–400.0)
RBC: 4.35 Mil/uL (ref 3.87–5.11)
RDW: 13 % (ref 11.5–15.5)
WBC: 4.9 10*3/uL (ref 4.0–10.5)

## 2018-02-11 LAB — VITAMIN D 25 HYDROXY (VIT D DEFICIENCY, FRACTURES): VITD: 61.48 ng/mL (ref 30.00–100.00)

## 2018-02-11 NOTE — Assessment & Plan Note (Signed)
With episodes of freezing, headache, fatigue, dizziness, 2 episodes of being unable to move or communicate with vision and hearing changes in past couple of months will proceed with MRI and neurology referral and continue increased dose of aspirin

## 2018-02-11 NOTE — Assessment & Plan Note (Signed)
Encouraged to get adequate exercise, calcium and vitamin d intake 

## 2018-02-11 NOTE — Assessment & Plan Note (Signed)
Check CMP.  ?

## 2018-02-11 NOTE — Assessment & Plan Note (Signed)
Well controlled, no changes to meds. Encouraged heart healthy diet such as the DASH diet and exercise as tolerated.  °

## 2018-02-11 NOTE — Assessment & Plan Note (Addendum)
Referred to orthopaedics, topical Aspirin and lidocaine

## 2018-02-11 NOTE — Assessment & Plan Note (Signed)
Supplement and monitor 

## 2018-02-11 NOTE — Patient Instructions (Signed)
Glucosamine Chondroitin and topical aspirin and lidocaine to knees Transient Ischemic Attack A transient ischemic attack (TIA) is a "warning stroke" that causes stroke-like symptoms that then go away quickly. The symptoms of a TIA come on suddenly, and they last less than 24 hours. Unlike a stroke, a TIA does not cause permanent damage to the brain. It is important to know the symptoms of a TIA and what to do. Seek medical care right away, even if your symptoms go away. Having a TIA is a sign that you are at higher risk for a permanent stroke. Lifestyle changes and medical treatments can help prevent a stroke. What are the causes? This condition is caused by a temporary blockage in an artery in the head or neck. The blockage does not allow the brain to get the blood supply it needs and can cause various symptoms. The blockage can be caused by:  Fatty buildup in an artery in the head or neck (atherosclerosis).  A blood clot.  Tearing of an artery (dissection).  Inflammation of an artery (vasculitis).  Sometimes the cause is not known. What increases the risk? Certain factors may make you more likely to develop this condition. Some of these factors are things that you can change, such as:  Obesity.  Using products that contain nicotine or tobacco, such as cigarettes and e-cigarettes.  Taking oral birth control, especially if you also use tobacco.  Lack of physical inactivity.  Excessive use of alcohol.  Use of drugs, especially cocaine and methamphetamine.  Other risk factors include:  High blood pressure (hypertension).  High cholesterol.  Diabetes mellitus.  Heart disease (coronary artery disease).  Atrial fibrillation.  Being African American or Hispanic.  Being over the age of 55.  Being female.  Family history of stroke.  Previous history of blood clots, stroke, TIA, or heart attack.  Sickle cell disease.  Being a woman with a history of  preeclampsia.  Migraine headache.  Sleep apnea.  Chronic inflammatory diseases, such as rheumatoid arthritis or lupus.  Blood clotting disorders (hypercoagulable state).  What are the signs or symptoms? Symptoms of this condition are the same as those of a stroke, but they are temporary. The symptoms develop suddenly, and they go away quickly, usually within minutes to hours. Symptoms may include sudden:  Weakness or numbness in your face, arm, or leg, especially on one side of your body.  Trouble walking or difficulty moving your arms or legs.  Trouble speaking, understanding speech, or both (aphasia).  Vision changes in one or both eyes. These include double vision, blurred vision, or loss of vision.  Dizziness.  Confusion.  Loss of balance or coordination.  Nausea and vomiting.  Severe headache with no known cause.  If possible, make note of the exact time that you last felt like your normal self and what time your symptoms started. Tell your health care provider. How is this diagnosed? This condition may be diagnosed based on:  Your symptoms and medical history.  A physical exam.  Imaging tests, usually a CT or MRI scan of the brain.  Blood tests.  You may also have other tests, including:  Electrocardiogram (ECG).  Echocardiogram.  Carotid ultrasound.  A scan of the brain circulation (CT angiogram or MRI angiogram).  Continuous heart monitoring.  How is this treated? The goal of treatment is to reduce the risk for a subsequent stroke. Treatment may include stroke prevention therapies such as:  Changes to diet or lifestyle to decrease your risk.  Lifestyle changes may include exercising and stopping smoking.  Medicines to thin the blood (antiplatelets or anticoagulants).  Blood pressure medicines.  Medicines to reduce cholesterol.  Treating other health conditions, such as diabetes or atrial fibrillation.  If testing shows that you have narrowing  in the arteries to your brain, your health care provider may recommend a procedure, such as:  Carotid endarterectomy. This is a surgery to remove the blockage from your artery.  Carotid angioplasty and stenting. This is a procedure to open or widen an artery in the neck using a metal mesh tube (stent). The stent helps keep the artery open by supporting the artery walls.  Follow these instructions at home: Medicines  Take over-the-counter and prescription medicines only as told by your health care provider.  If you were told to take a medicine to thin your blood, such as aspirin or an anticoagulant, take it exactly as told by your health care provider. ? Taking too much blood-thinning medicine can cause bleeding. ? If you do not take enough blood-thinning medicine, you will not have the protection that you need against a stroke and other problems. Eating and drinking  Eat 5 or more servings of fruits and vegetables each day.  Follow instructions from your health care provider about diet. You may need to follow a certain nutrition plan to help manage risk factors for stroke, such as high blood pressure, high cholesterol, diabetes, or obesity. This may include: ? Eating a low-fat, low-salt diet. ? Including a lot of fiber in your diet. ? Limiting the amount of carbohydrates and sugar in your diet.  Limit alcohol intake to no more than 1 drink a day for nonpregnant women and 2 drinks a day for men. One drink equals 12 oz of beer, 5 oz of wine, or 1 oz of hard liquor. General instructions  Maintain a healthy weight.  Stay physically active. Try to get at least 30 minutes of exercise on most or all days.  Find out if you have sleep apnea, and seek treatment if needed.  Do not use any products that contain nicotine or tobacco, such as cigarettes and e-cigarettes. If you need help quitting, ask your health care provider.  Do not abuse drugs.  Keep all follow-up visits as told by your  health care provider. This is important. Where to find more information:  American Stroke Association: www.strokeassociation.org  National Stroke Association: www.stroke.org Get help right away if:  You have chest pain or an irregular heartbeat.  You have any symptoms of stroke. The acronym BEFAST is an easy way to remember the main warning signs of stroke. ? B = Balance problems. Signs include dizziness, sudden trouble walking, or loss of balance. ? E = Eye problems. This includes trouble seeing or a sudden change in vision. ? F = Face changes. This includes sudden weakness or numbness of the face, or the face or eyelid drooping to one side. ? A = Arm weakness or numbness. This happens suddenly and usually on one side of the body. ? S = Speech problems. This includes trouble speaking or trouble understanding speech. ? T = Time. Time to call 911 or seek emergency care. Do not wait to see if symptoms will go away. Make note of the time your symptoms started.  Other signs of stroke may include: ? A sudden, severe headache with no known cause. ? Nausea or vomiting. ? Seizure. These symptoms may represent a serious problem that is an emergency. Do not wait  to see if the symptoms will go away. Get medical help right away. Call your local emergency services (911 in the [place] Do not drive yourself to the hospital. Summary  A TIA happens when an artery in the head or neck is blocked, leading to stroke-like symptoms that then go away quickly. The blockage clears before there is any permanent brain damage. A TIA is a medical emergency and requires immediate medical attention.  Symptoms of this condition are the same as those of a stroke, but they are temporary. The symptoms usually develop suddenly, and they go away quickly, usually within minutes to hours.  Having a TIA means that you are at high risk of a stroke in the near future.  Treatment may include medicines to thin the blood as well as  medicines, diet changes, and lifestyle changes to manage conditions that increase the risk of another TIA or a stroke. This information is not intended to replace advice given to you by your health care provider. Make sure you discuss any questions you have with your health care provider. Document Released: 11/30/2004 Document Revised: 04/04/2016 Document Reviewed: 04/04/2016 Elsevier Interactive Patient Education  2018 [employer].

## 2018-02-11 NOTE — Assessment & Plan Note (Signed)
Tolerating statin, encouraged heart healthy diet, avoid trans fats, minimize simple carbs and saturated fats. Increase exercise as tolerated 

## 2018-02-13 ENCOUNTER — Other Ambulatory Visit: Payer: Self-pay | Admitting: Family Medicine

## 2018-02-16 ENCOUNTER — Ambulatory Visit (HOSPITAL_BASED_OUTPATIENT_CLINIC_OR_DEPARTMENT_OTHER)
Admission: RE | Admit: 2018-02-16 | Discharge: 2018-02-16 | Disposition: A | Discharge status: Home / Self Care | Payer: PPO | Source: Ambulatory Visit | Attending: Family Medicine | Admitting: Family Medicine

## 2018-02-16 DIAGNOSIS — Q283 Other malformations of cerebral vessels: Secondary | ICD-10-CM | POA: Diagnosis not present

## 2018-02-16 DIAGNOSIS — R51 Headache: Secondary | ICD-10-CM | POA: Diagnosis not present

## 2018-02-16 DIAGNOSIS — R4781 Slurred speech: Secondary | ICD-10-CM | POA: Diagnosis not present

## 2018-02-16 DIAGNOSIS — R519 Headache, unspecified: Secondary | ICD-10-CM

## 2018-02-16 DIAGNOSIS — R42 Dizziness and giddiness: Secondary | ICD-10-CM

## 2018-02-16 DIAGNOSIS — R5383 Other fatigue: Secondary | ICD-10-CM | POA: Diagnosis not present

## 2018-02-17 NOTE — Progress Notes (Signed)
Subjective:    Patient ID: Erika [name], female    DOB: Jan 16, 1946, 72 y.o.   MRN: [id number]  No chief complaint on file.   HPI Patient is in today for follow-up with numerous concerns.  1 is that she continues to struggle with snoring and restless sleep.  She has significant fatigue and frequent headaches.  She is willing to consider sleep study at this time.  Has had 2 episodes of what she describes as freezing.  They only last for seconds.  She is unable to move or pick up her feet.  She has a sense of visual changes as well and cannot speak then after seconds the episode resolves and she feels normal.  No syncope, seizure activity no recent fall or tr.  She also notes ongoing knee pain bilaterally and she is ready for referral.  No warmth or redness. Denies CP/palp/SOB/HA/congestion/fevers/GI or GU c/o. Taking meds as prescribed  Past Medical History:  Diagnosis Date  . Allergic state 06/24/2012  . Anterior cervical lymphadenopathy 02/08/2013  . Arthritis 04/02/2011  . Bronchitis 04/02/2011  . Chicken pox as a child  . Diverticulosis of colon    colonoscopy 04/14/1999  . GERD (gastroesophageal reflux disease)   . History of fibrocystic disease of breast   . Hyperlipidemia, mixed 02/20/2007   Followed as Primary Care Patient/ [facility]/ [facility]  Goal LDL < 130 HBP/Pos fm hx      . Hypertension   . Hypokalemia 06/24/2012  . Hyponatremia 08/02/2014  . IBS (irritable bowel syndrome) 04/02/2011  . Measles as a child  . Mumps as a child  . Obesity   . Osteopenia   . Overactive bladder 04/02/2011  . Overweight   . Preventative health care 03/13/2012  . Shoulder pain, bilateral 06/14/2011    Past Surgical History:  Procedure Laterality Date  . BREAST REDUCTION SURGERY  1986  . BUNIONECTOMY Bilateral 1980  . CATARACT EXTRACTION, BILATERAL    . HAMMER TOE SURGERY Right 12-03-13   foot  . MOUTH SURGERY  01-10-13   negative  . TONSILLECTOMY      Family History  Problem Relation  Age of Onset  . Stroke Mother 46  . Hyperlipidemia Mother   . Hypertension Mother   . Heart attack Father 27  . Hypertension Father   . Heart disease Father   . Hypertension Brother   . Aneurysm Brother        aortic  . Hyperlipidemia Brother   . Heart disease Brother        aortic aneurysm rupture  . Multiple sclerosis Daughter   . Alcohol abuse Son   . Schizophrenia Son   . Bipolar disorder Son   . Mental illness Son        schizophrenic, bipolar    Social History   Socioeconomic History  . Marital status: Married    Spouse name: Not on file  . Number of children: 4  . Years of education: Not on file  . Highest education level: Not on file  Occupational History  . Occupation: dental hygenist  Social Needs  . Financial resource strain: Not on file  . Food insecurity:    Worry: Not on file    Inability: Not on file  . Transportation needs:    Medical: Not on file    Non-medical: Not on file  Tobacco Use  . Smoking status: Never Smoker  . Smokeless tobacco: Never Used  Substance and Sexual Activity  . Alcohol  use: No  . Drug use: No  . Sexual activity: Not Currently  Lifestyle  . Physical activity:    Days per week: Not on file    Minutes per session: Not on file  . Stress: Not on file  Relationships  . Social connections:    Talks on phone: Not on file    Gets together: Not on file    Attends religious service: Not on file    Active member of club or organization: Not on file    Attends meetings of clubs or organizations: Not on file    Relationship status: Not on file  . Intimate partner violence:    Fear of current or ex partner: Not on file    Emotionally abused: Not on file    Physically abused: Not on file    Forced sexual activity: Not on file  Other Topics Concern  . Not on file  Social History Narrative   4 children 2 are adopted    Outpatient Medications Prior to Visit  Medication Sig Dispense Refill  . aspirin 325 MG EC tablet Take 325  mg by mouth daily.    Marland Kitchen atorvastatin (LIPITOR) 10 MG tablet Take 1 tablet (10 mg total) by mouth daily at 6 PM. 90 tablet 2  . calcium carbonate (TUMS) 500 MG chewable tablet Chew 1 tablet by mouth as needed.     . cetirizine (ZYRTEC) 10 MG tablet Take 10 mg by mouth at bedtime as needed.      . Cholecalciferol (VITAMIN D PO) Take 1 capsule by mouth daily.    . cycloSPORINE (RESTASIS) 0.05 % ophthalmic emulsion Place 1 drop into both eyes 2 (two) times daily.    . metoprolol succinate (TOPROL-XL) 50 MG 24 hr tablet Take 1 tablet (50 mg total) by mouth 2 (two) times daily. Take with or immediately following a meal. 90 tablet 0  . Multiple Minerals-Vitamins (CITRACAL PLUS PO) Take 1 tablet by mouth daily.      . ranitidine (ZANTAC) 300 MG tablet TAKE 1 TABLET (300 MG TOTAL) BY MOUTH AT BEDTIME. 30 tablet 2  . tiZANidine (ZANAFLEX) 2 MG tablet Take 1-2 tablets (2-4 mg total) by mouth at bedtime as needed for muscle spasms. 30 tablet 0  . triamterene-hydrochlorothiazide (MAXZIDE-25) 37.5-25 MG tablet Take 1 tablet by mouth daily. 90 tablet 1  . trospium (SANCTURA) 20 MG tablet Take 20 mg by mouth daily.      . valsartan (DIOVAN) 320 MG tablet TAKE 1 TABLET BY MOUTH EVERY DAY 90 tablet 0  . Vitamin D, Ergocalciferol, (DRISDOL) 50000 units CAPS capsule TAKE 1 CAPSULE (50,000 UNITS TOTAL) BY MOUTH EVERY 7 (SEVEN) DAYS. 12 capsule 1  . amLODipine (NORVASC) 10 MG tablet TAKE 0.5 TABLETS (5 MG TOTAL) BY MOUTH 2 (TWO) TIMES DAILY. 90 tablet 1  . omeprazole (PRILOSEC) 20 MG capsule TAKE ONE CAPSULE BY MOUTH EVERY DAY 90 capsule 2  . aspirin 81 MG tablet Take 81 mg by mouth daily.       No facility-administered medications prior to visit.     No Known Allergies  Review of Systems  Constitutional: Positive for malaise/fatigue. Negative for fever.  HENT: Negative for congestion.   Eyes: Negative for blurred vision.  Respiratory: Negative for shortness of breath.   Cardiovascular: Negative for chest  pain, palpitations and leg swelling.  Gastrointestinal: Negative for abdominal pain, blood in stool and nausea.  Genitourinary: Negative for dysuria and frequency.  Musculoskeletal: Positive for joint pain. Negative  for falls.  Skin: Negative for rash.  Neurological: Positive for tremors, weakness and headaches. Negative for dizziness, seizures and loss of consciousness.  Endo/Heme/Allergies: Negative for environmental allergies.  Psychiatric/Behavioral: Negative for depression. The patient is not nervous/anxious.        Objective:    Physical Exam Vitals signs and nursing note reviewed.  Constitutional:      General: She is not in acute distress.    Appearance: She is well-developed.  HENT:     Head: Normocephalic and atraumatic.     Nose: Nose normal.  Eyes:     General:        Right eye: No discharge.        Left eye: No discharge.  Neck:     Musculoskeletal: Normal range of motion and neck supple.  Cardiovascular:     Rate and Rhythm: Normal rate and regular rhythm.     Heart sounds: No murmur.  Pulmonary:     Effort: Pulmonary effort is normal.     Breath sounds: Normal breath sounds.  Abdominal:     General: Bowel sounds are normal.     Palpations: Abdomen is soft.     Tenderness: There is no abdominal tenderness.  Skin:    General: Skin is warm and dry.  Neurological:     Mental Status: She is alert and oriented to person, place, and time.     Comments: Mild tremor noted in hands     BP 136/62 (BP Location: Left Arm, Patient Position: Sitting, Cuff Size: Normal)   Pulse (!) 56   Temp 98 F (36.7 C) (Oral)   Resp 18   Ht 5\' 6"  (1.676 m)   Wt 176 lb 3.2 oz (79.9 kg)   SpO2 98%   BMI 28.44 kg/m  Wt Readings from Last 3 Encounters:  02/11/18 176 lb 3.2 oz (79.9 kg)  12/18/17 175 lb (79.4 kg)  10/30/17 178 lb 9.6 oz (81 kg)     Lab Results  Component Value Date   WBC 4.9 02/11/2018   HGB 12.3 02/11/2018   HCT 36.1 02/11/2018   PLT 370.0 02/11/2018     GLUCOSE 95 02/11/2018   CHOL 173 02/11/2018   TRIG 138.0 02/11/2018   HDL 41.10 02/11/2018   LDLDIRECT 163.0 07/24/2014   LDLCALC 104 (H) 02/11/2018   ALT 13 02/11/2018   AST 17 02/11/2018   NA 135 02/11/2018   K 4.4 02/11/2018   CL 96 02/11/2018   CREATININE 1.30 (H) 02/11/2018   BUN 17 02/11/2018   CO2 31 02/11/2018   TSH 1.09 02/11/2018    Lab Results  Component Value Date   TSH 1.09 02/11/2018   Lab Results  Component Value Date   WBC 4.9 02/11/2018   HGB 12.3 02/11/2018   HCT 36.1 02/11/2018   MCV 82.9 02/11/2018   PLT 370.0 02/11/2018   Lab Results  Component Value Date   NA 135 02/11/2018   K 4.4 02/11/2018   CO2 31 02/11/2018   GLUCOSE 95 02/11/2018   BUN 17 02/11/2018   CREATININE 1.30 (H) 02/11/2018   BILITOT 0.6 02/11/2018   ALKPHOS 90 02/11/2018   AST 17 02/11/2018   ALT 13 02/11/2018   PROT 7.4 02/11/2018   ALBUMIN 4.3 02/11/2018   CALCIUM 10.0 02/11/2018   GFR 42.78 (L) 02/11/2018   Lab Results  Component Value Date   CHOL 173 02/11/2018   Lab Results  Component Value Date   HDL 41.10 02/11/2018  Lab Results  Component Value Date   LDLCALC 104 (H) 02/11/2018   Lab Results  Component Value Date   TRIG 138.0 02/11/2018   Lab Results  Component Value Date   CHOLHDL 4 02/11/2018   No results found for: HGBA1C     Assessment & Plan:   Problem List Items Addressed This Visit    Hyperlipidemia, mixed    Tolerating statin, encouraged heart healthy diet, avoid trans fats, minimize simple carbs and saturated fats. Increase exercise as tolerated      Relevant Medications   aspirin 325 MG EC tablet   Other Relevant Orders   Lipid panel (Completed)   Hypertension    Well controlled, no changes to meds. Encouraged heart healthy diet such as the DASH diet and exercise as tolerated.       Relevant Medications   aspirin 325 MG EC tablet   Other Relevant Orders   TSH (Completed)   CBC (Completed)   Comprehensive metabolic panel  (Completed)   Osteopenia    Encouraged to get adequate exercise, calcium and vitamin d intake      Hyponatremia    Check CMP      Fatigue   Relevant Orders   Ambulatory referral to Pulmonology   MR MRA HEAD WO CONTRAST (Completed)   Low vitamin D level    Supplement and monitor      Relevant Orders   VITAMIN D 25 Hydroxy (Vit-D Deficiency, Fractures) (Completed)   Knee pain, bilateral    Referred to orthopaedics, topical Aspirin and lidocaine      Relevant Orders   Ambulatory referral to Orthopedic Surgery   Tremor    Worsening now noting trouble with grip strength and she has begun to drop things. She has had 2 spells of feeling as if she could not move. She could not make her body listen to her and she froze. She had some visual blurring as well. It lasted seconds and she did not fall or experience syncope. MR of brain is ordered and she is referred to neurology for further evaluation.       Snoring - Primary    With restless sleep, frequent waking, headaches. Will refer to pulmonology for evaluation of sleep apnea      Relevant Orders   Ambulatory referral to Pulmonology   Headache    With episodes of freezing, headache, fatigue, dizziness, 2 episodes of being unable to move or communicate with vision and hearing changes in past couple of months will proceed with MRI and neurology referral and continue increased dose of aspirin      Relevant Medications   aspirin 325 MG EC tablet   Other Relevant Orders   Ambulatory referral to Pulmonology   MR MRA HEAD WO CONTRAST (Completed)   MR MRA HEAD WO CONTRAST (Completed)   Ambulatory referral to Neurology    Other Visit Diagnoses    Restless sleeper       Relevant Orders   Ambulatory referral to Pulmonology   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  No orders of the defined types were placed in this encounter.    Penni Homans, MD

## 2018-02-17 NOTE — Assessment & Plan Note (Signed)
With restless sleep, frequent waking, headaches. Will refer to pulmonology for evaluation of sleep apnea

## 2018-02-17 NOTE — Assessment & Plan Note (Signed)
Worsening now noting trouble with grip strength and she has begun to drop things. She has had 2 spells of feeling as if she could not move. She could not make her body listen to her and she froze. She had some visual blurring as well. It lasted seconds and she did not fall or experience syncope. MR of brain is ordered and she is referred to neurology for further evaluation.

## 2018-02-21 ENCOUNTER — Telehealth: Payer: Self-pay | Admitting: Family Medicine

## 2018-02-21 NOTE — Telephone Encounter (Signed)
Copied from [place] [id number]. Topic: General - Other >> Feb 21, 2018  3:31 PM Windy Kalata wrote: Reason for CRM: Patient is calling asking about her MRI results from her appt on 02/16/18, please advise  Best call back is [phone]

## 2018-02-22 NOTE — Telephone Encounter (Signed)
Left message for patient to call the office back    See imaging results on 02/16/18  Nurse triage may handle

## 2018-02-22 NOTE — Telephone Encounter (Signed)
Pt given MRI results per notes of Dr. Charlett Blake on 02/17/18. Pt verbalized understanding.   Result Notes for MR MRA HEAD WO CONTRAST   Notes recorded by Magdalene Molly, RMA on 02/18/2018 at 5:03 PM EST Mailed letter ------  Notes recorded by [name], MD on 02/17/2018 at 11:04 AM EST Notify no acute concerns. Just some congenital changes (from birth) can discuss with neurology at consultation

## 2018-02-27 IMAGING — DX DG KNEE COMPLETE 4+V*L*
4 series · 4 of 4 positions shown · non-contrast
Comparison: None.

CLINICAL DATA: Bilateral knee pain for 6 months

EXAM:
RIGHT KNEE - COMPLETE 4+ VIEW; LEFT KNEE - COMPLETE 4+ VIEW

[knee ap]
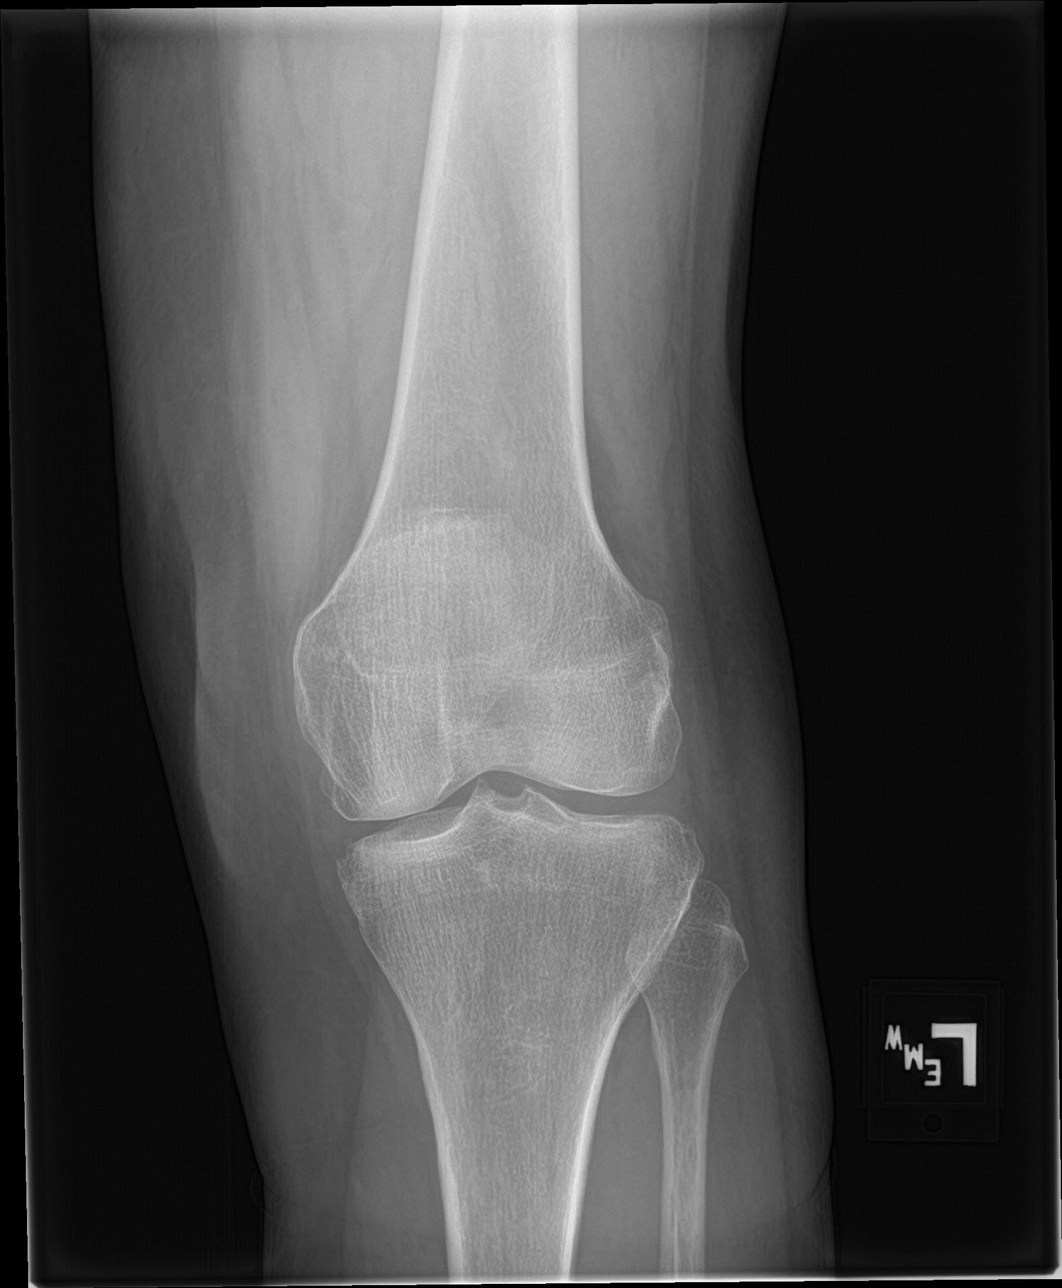

[knee lat]
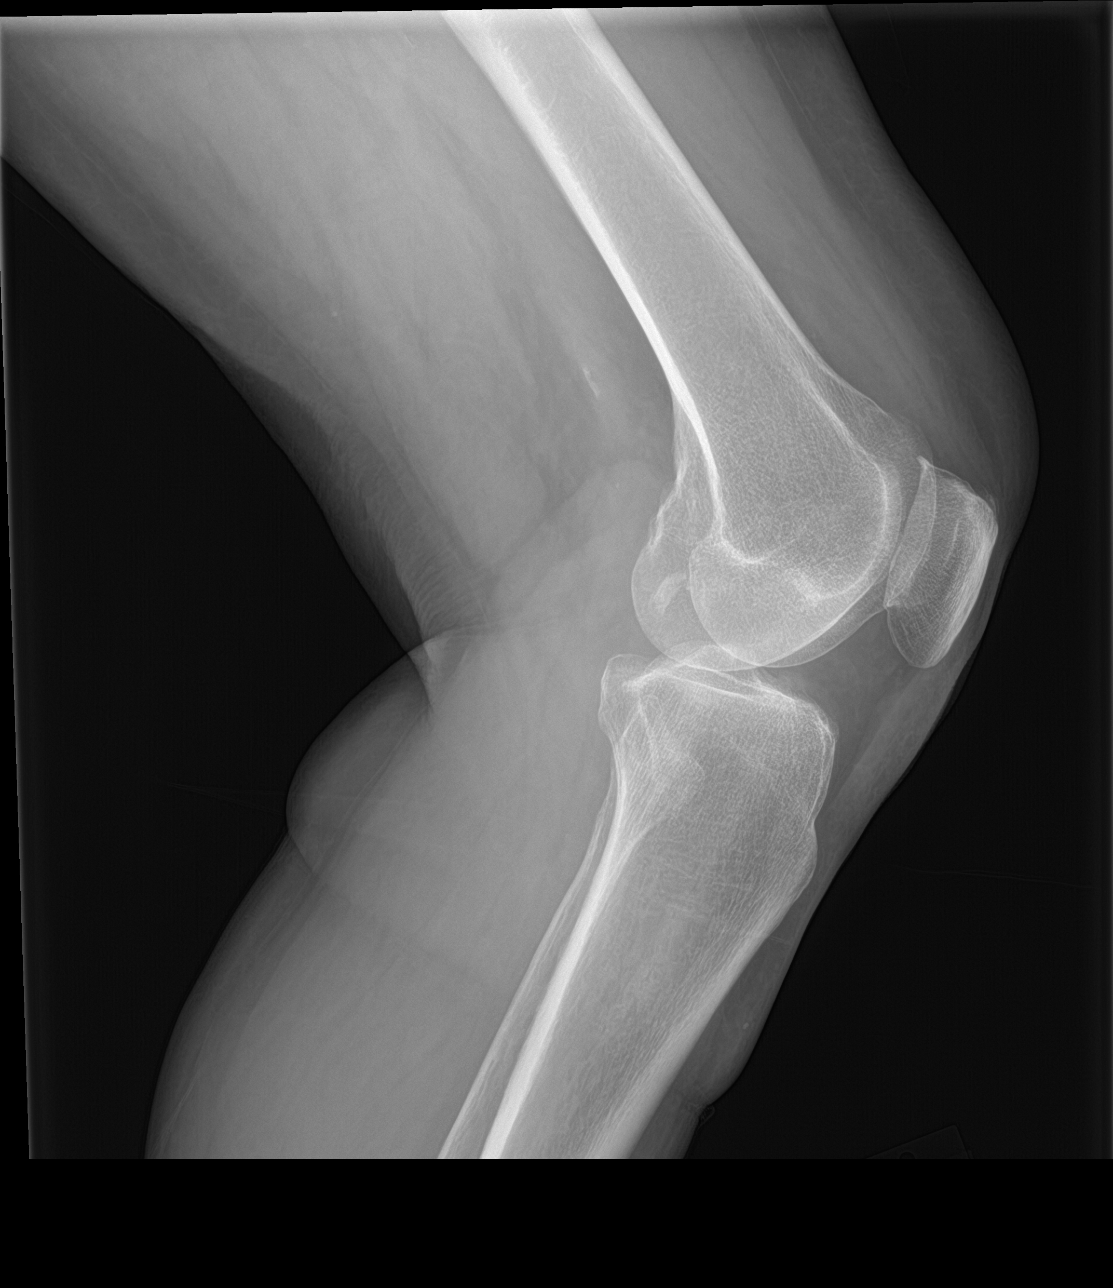

[knee obl (1 of 2)]
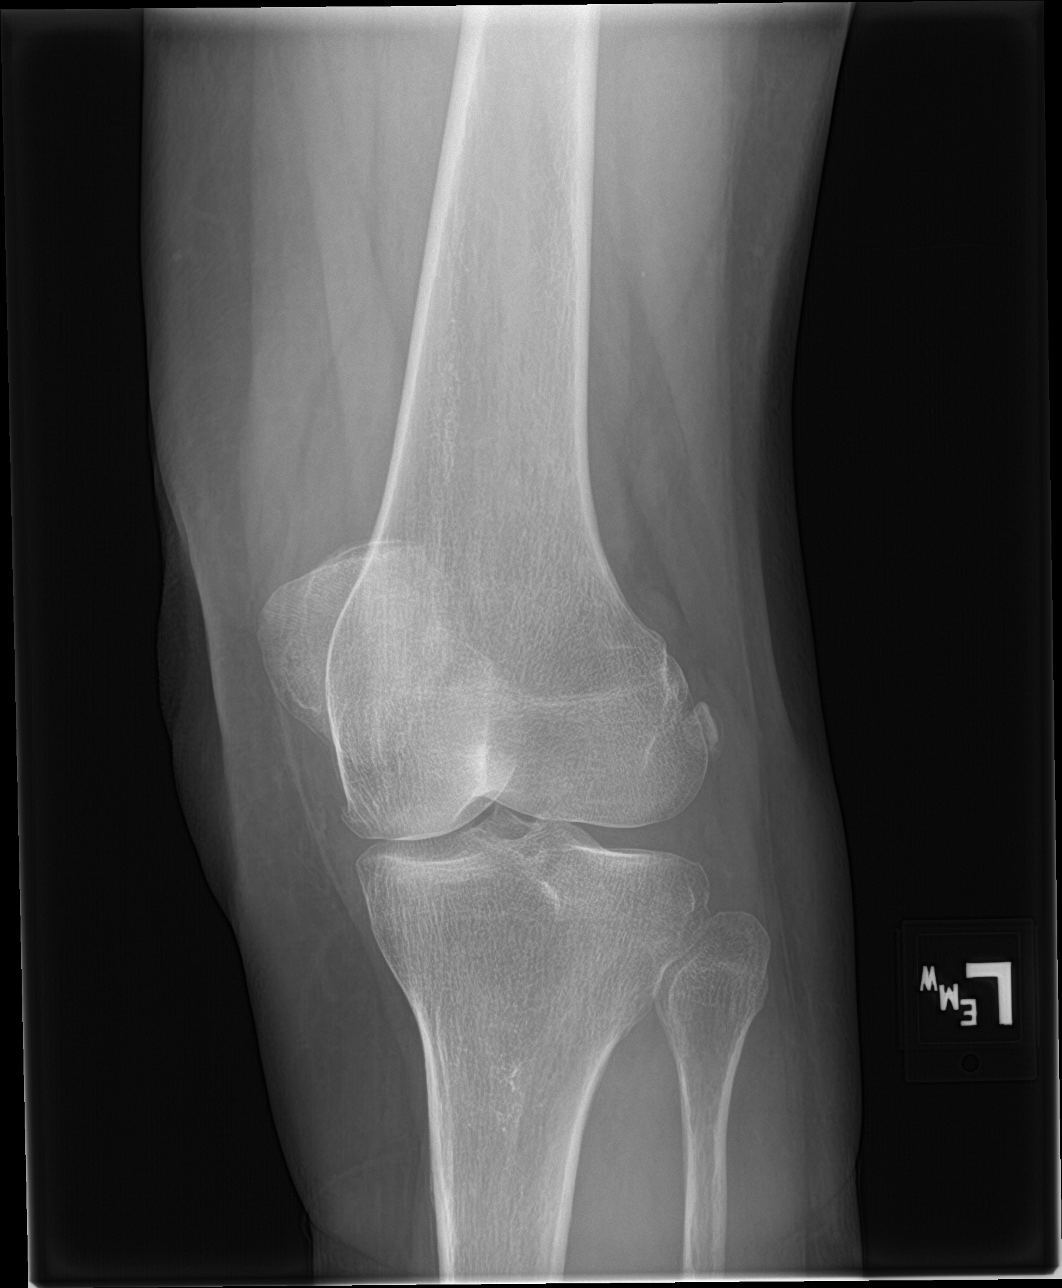

[knee obl (2 of 2)]
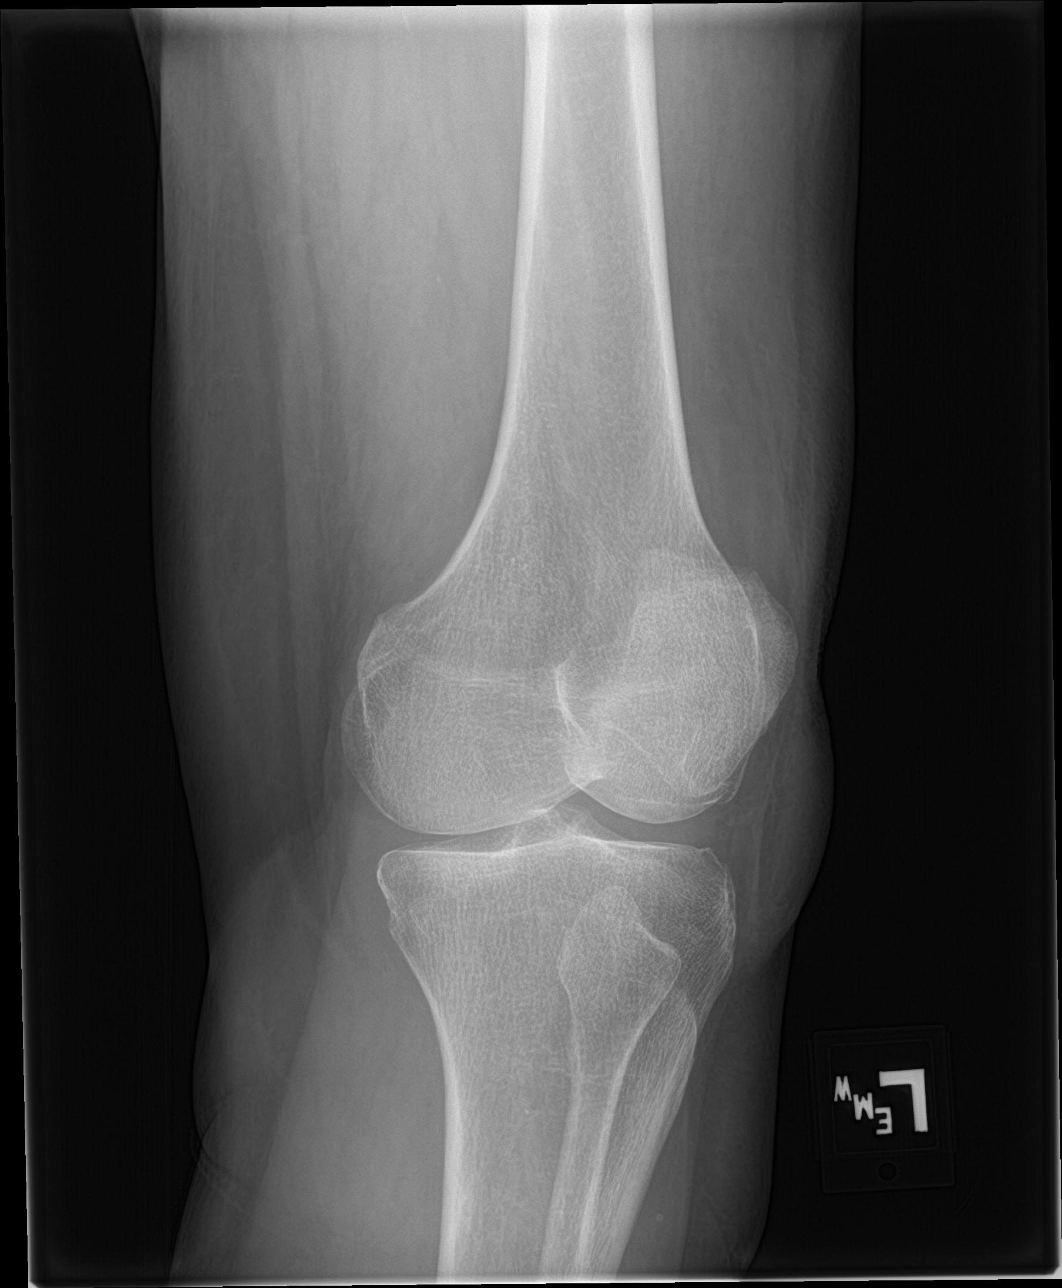

[4 of 4 positions shown; findings below may reference images not displayed]

FINDINGS: Bones:  No fracture or dislocation.  Normal bone mineralization.

Joints: Normal alignment. No erosive changes. Mild left medial
femorotibial compartment joint space narrowing with tiny marginal
osteophytes. Remainder of the left knee joint spaces are relatively
well maintained. Small marginal left superior patellar osteophyte.
Minimal right knee medial femorotibial compartment joint space
narrowing. Remainder of the right knee joint spaces are maintained.
No significant joint effusion.

Soft tissue: No soft tissue abnormality. No radiopaque foreign body.
No subcutaneous emphysema.
IMPRESSION: 1.  No acute osseous injury of the right knee knee.
2.  No acute osseous injury of the left knee.
3. Minimal medial femorotibial compartment joint space narrowing
bilaterally, left slightly more than right.

## 2018-02-27 IMAGING — DX DG KNEE COMPLETE 4+V*R*
4 series · 4 of 4 positions shown · non-contrast
Comparison: None.

CLINICAL DATA: Bilateral knee pain for 6 months

EXAM:
RIGHT KNEE - COMPLETE 4+ VIEW; LEFT KNEE - COMPLETE 4+ VIEW

[knee ap]
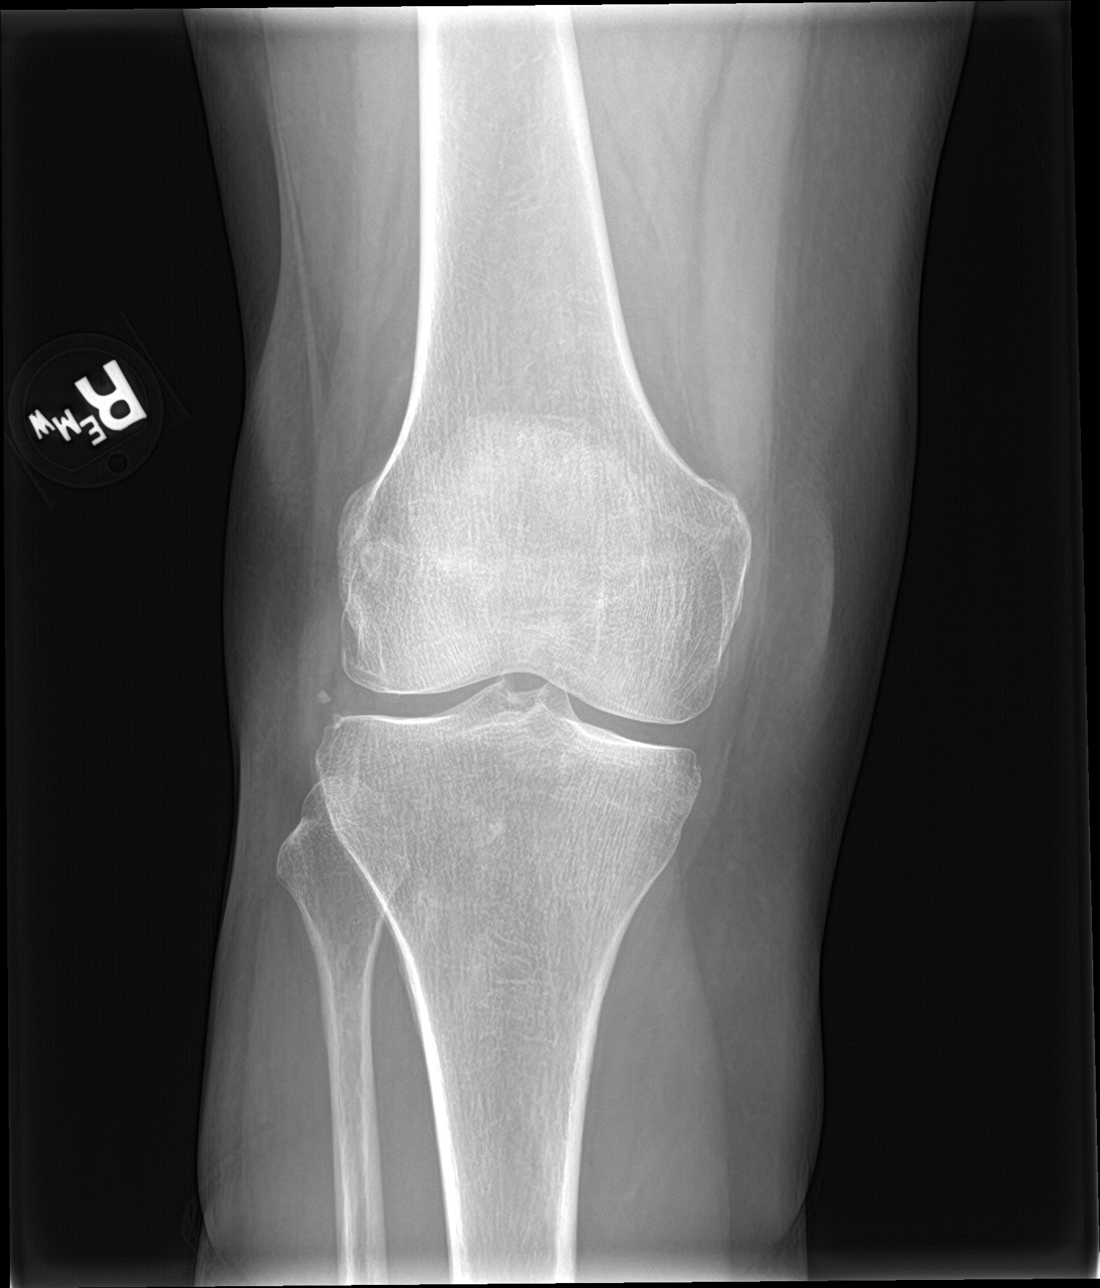

[knee lat]
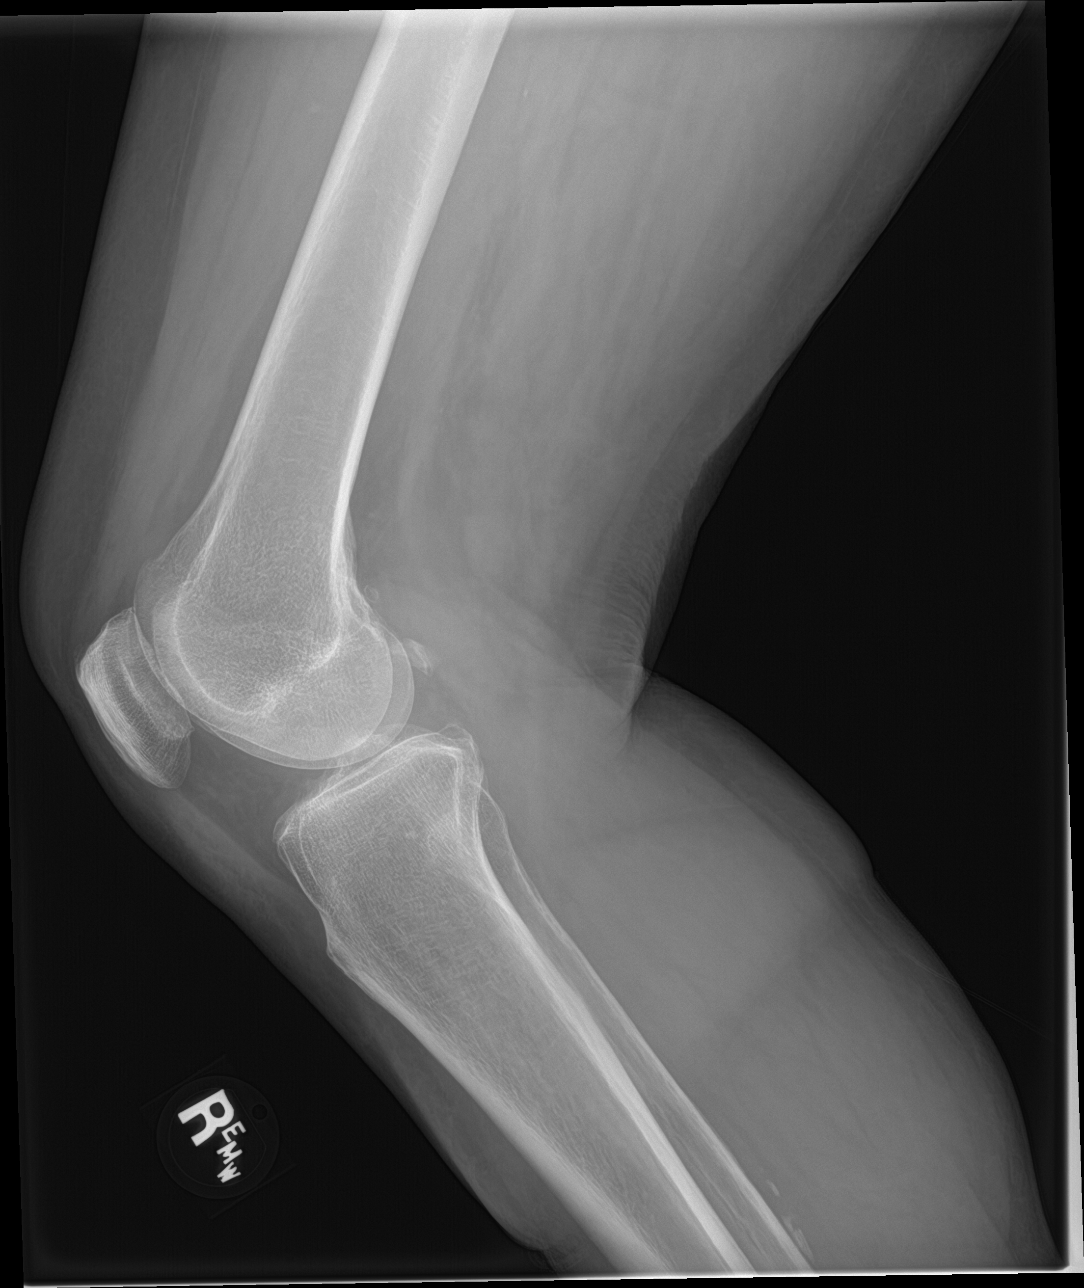

[knee obl (1 of 2)]
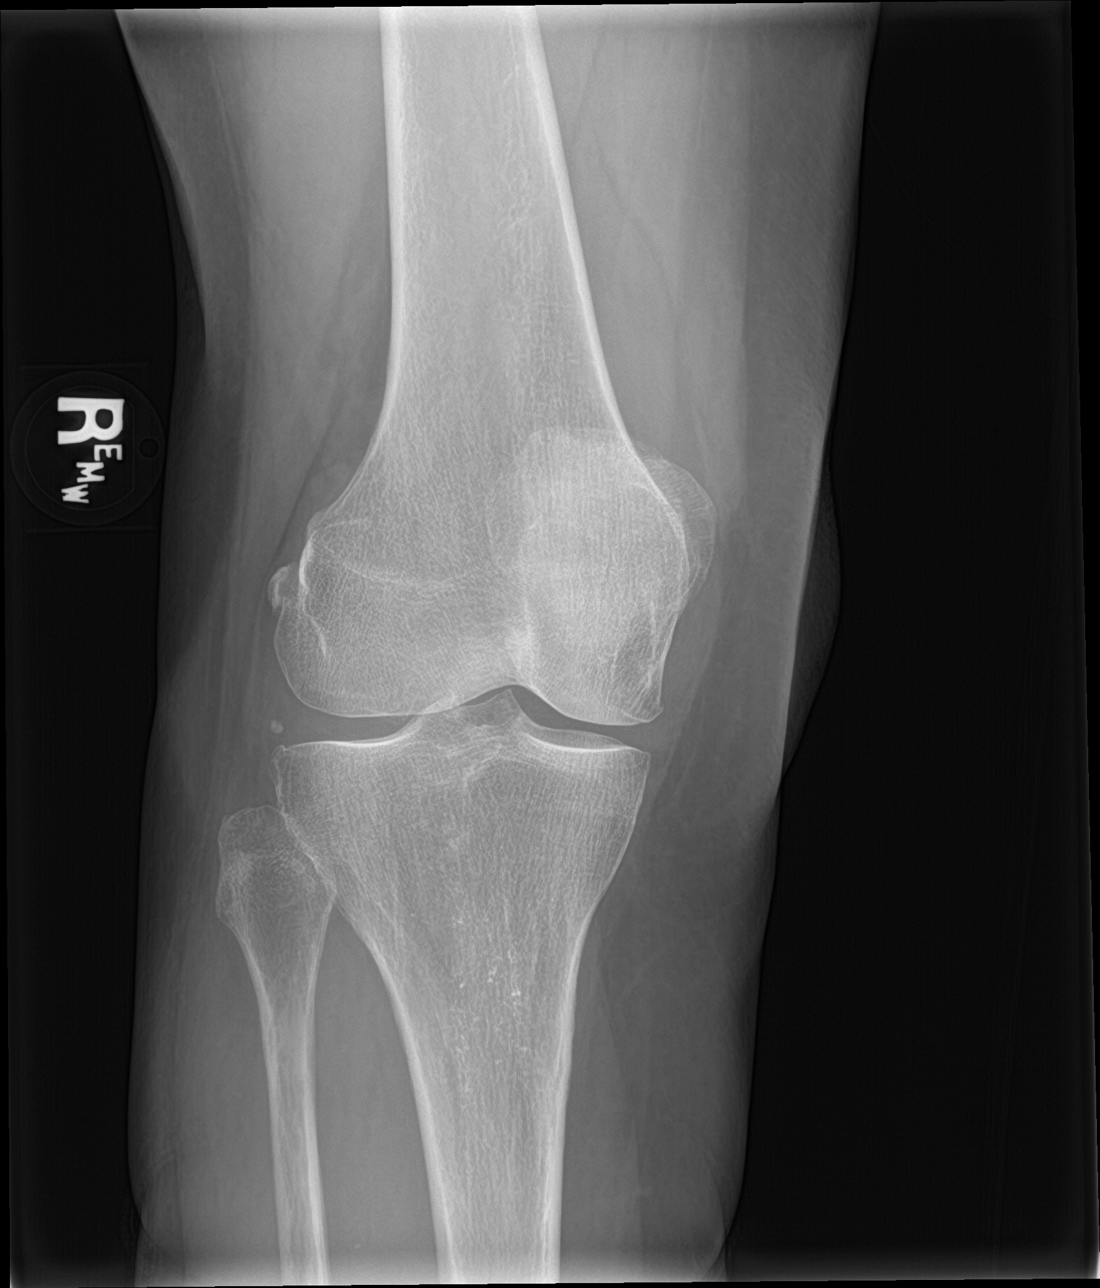

[knee obl (2 of 2)]
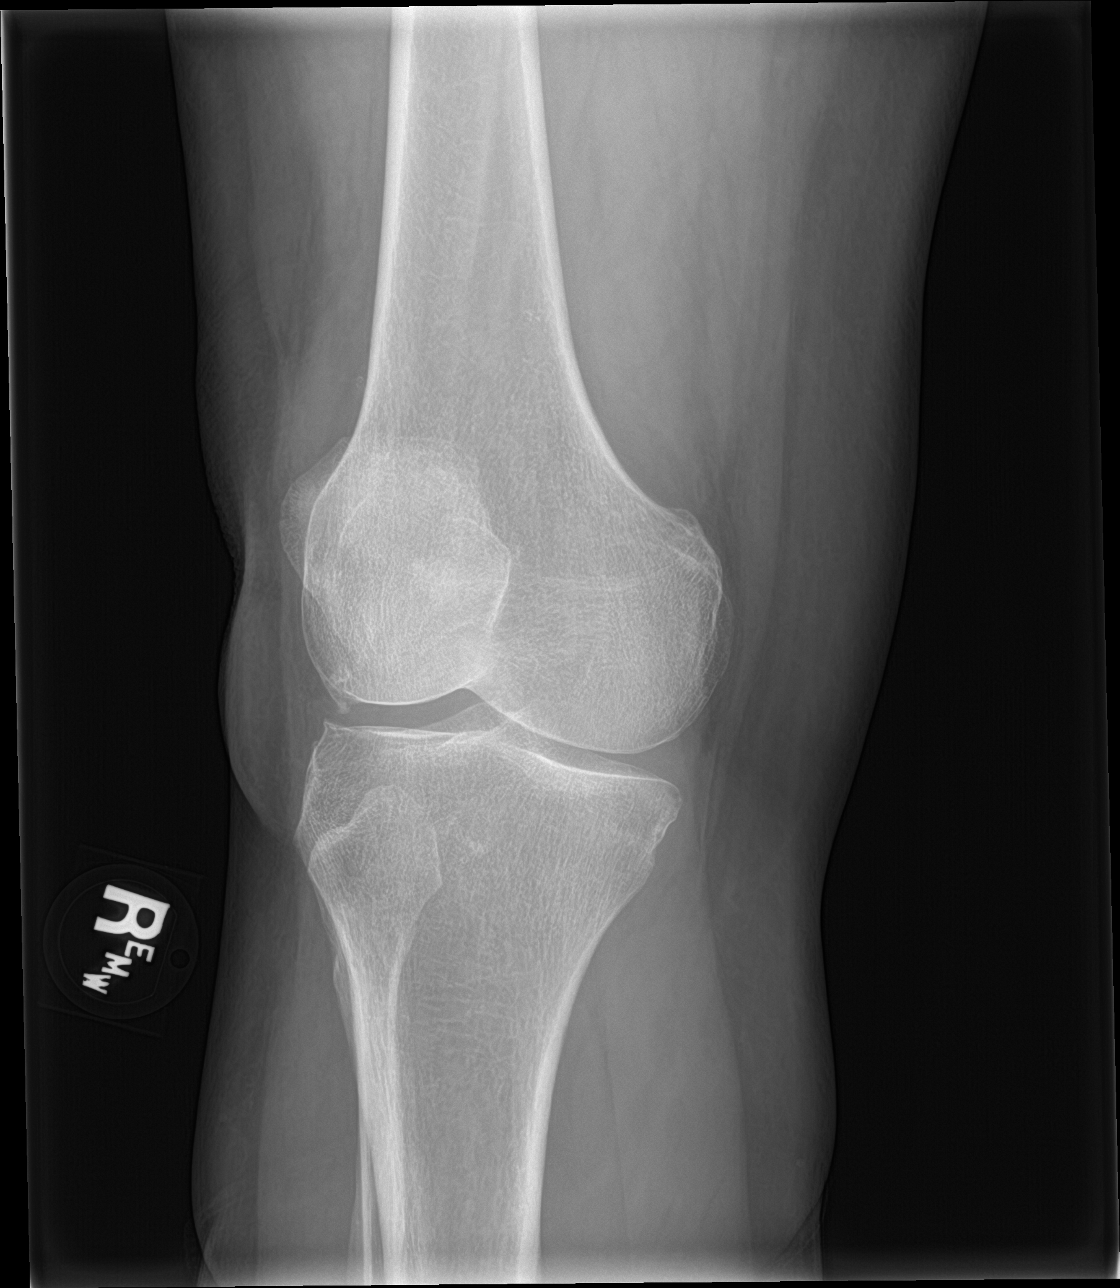

[4 of 4 positions shown; findings below may reference images not displayed]

FINDINGS: Bones:  No fracture or dislocation.  Normal bone mineralization.

Joints: Normal alignment. No erosive changes. Mild left medial
femorotibial compartment joint space narrowing with tiny marginal
osteophytes. Remainder of the left knee joint spaces are relatively
well maintained. Small marginal left superior patellar osteophyte.
Minimal right knee medial femorotibial compartment joint space
narrowing. Remainder of the right knee joint spaces are maintained.
No significant joint effusion.

Soft tissue: No soft tissue abnormality. No radiopaque foreign body.
No subcutaneous emphysema.
IMPRESSION: 1.  No acute osseous injury of the right knee knee.
2.  No acute osseous injury of the left knee.
3. Minimal medial femorotibial compartment joint space narrowing
bilaterally, left slightly more than right.

## 2018-03-12 ENCOUNTER — Ambulatory Visit (INDEPENDENT_AMBULATORY_CARE_PROVIDER_SITE_OTHER): Payer: Self-pay

## 2018-03-12 ENCOUNTER — Ambulatory Visit (INDEPENDENT_AMBULATORY_CARE_PROVIDER_SITE_OTHER): Payer: PPO | Admitting: Orthopaedic Surgery

## 2018-03-12 DIAGNOSIS — M25561 Pain in right knee: Secondary | ICD-10-CM

## 2018-03-12 DIAGNOSIS — M25562 Pain in left knee: Secondary | ICD-10-CM

## 2018-03-12 DIAGNOSIS — G8929 Other chronic pain: Secondary | ICD-10-CM

## 2018-03-12 DIAGNOSIS — M1711 Unilateral primary osteoarthritis, right knee: Secondary | ICD-10-CM

## 2018-03-12 DIAGNOSIS — M1712 Unilateral primary osteoarthritis, left knee: Secondary | ICD-10-CM

## 2018-03-12 MED ORDER — LIDOCAINE HCL 1 % IJ SOLN
3.0000 mL | INTRAMUSCULAR | Status: AC | PRN
  Administered 2018-03-12: 3 mL

## 2018-03-12 MED ORDER — METHYLPREDNISOLONE ACETATE 40 MG/ML IJ SUSP
40.0000 mg | INTRAMUSCULAR | Status: AC | PRN
  Administered 2018-03-12: 40 mg via INTRA_ARTICULAR

## 2018-03-12 NOTE — Progress Notes (Signed)
Office Visit Note   Patient: Erika [name]           Date of Birth: [birth year]           MRN: [id number] Visit Date: 03/12/2018              Requested by: [name], MD [address] [place], [place] [address] PCP: [name], MD   Assessment & Plan: Visit Diagnoses:  1. Chronic pain of left knee   2. Chronic pain of right knee   3. Unilateral primary osteoarthritis, left knee   4. Unilateral primary osteoarthritis, right knee     Plan: She certainly does have mild to moderate arthritis of both knees.  This is osteoarthritis.  I have recommended a combination of quad strengthening exercises as well as oral anti-inflammatories combining Aleve with Tylenol arthritis.  Of also recommended steroid injections for both knees followed up by hyaluronic acid for both knees in a month from now.  She agrees with this treatment plan.  All question concerns were answered and addressed.  She understands the risk and benefits of steroid injections and tolerated them well in both knees.  We will see her back in 4 weeks to hopefully provide hyaluronic acid in both knees.  Follow-Up Instructions: Return in about 4 weeks (around 04/09/2018).   Orders:  Orders Placed This Encounter  Procedures  . Large Joint Inj  . Large Joint Inj  . XR Knee 1-2 Views Left  . XR Knee 1-2 Views Right   No orders of the defined types were placed in this encounter.     Procedures: Large Joint Inj: R knee on 03/12/2018 9:45 AM Indications: diagnostic evaluation and pain Details: 22 G 1.5 in needle, superolateral approach  Arthrogram: No  Medications: 3 mL lidocaine 1 %; 40 mg methylPREDNISolone acetate 40 MG/ML Outcome: tolerated well, no immediate complications Procedure, treatment alternatives, risks and benefits explained, specific risks discussed. Consent was given by the patient. Immediately prior to procedure a time out was called to verify the correct patient, procedure, equipment,  support staff and site/side marked as required. Patient was prepped and draped in the usual sterile fashion.   Large Joint Inj: L knee on 03/12/2018 9:45 AM Indications: diagnostic evaluation and pain Details: 22 G 1.5 in needle, superolateral approach  Arthrogram: No  Medications: 3 mL lidocaine 1 %; 40 mg methylPREDNISolone acetate 40 MG/ML Outcome: tolerated well, no immediate complications Procedure, treatment alternatives, risks and benefits explained, specific risks discussed. Consent was given by the patient. Immediately prior to procedure a time out was called to verify the correct patient, procedure, equipment, support staff and site/side marked as required. Patient was prepped and draped in the usual sterile fashion.       Clinical Data: No additional findings.   Subjective: Chief Complaint  Patient presents with  . Left Knee - Pain  . Right Knee - Pain  The patient is someone I am seeing for the first time as a patient but her husband is a regular patient of mine.  She comes in today for evaluation treatment of bilateral knee pain.  Both knees get stiff on her from time to time and when she is up she is okay but if she is getting out of a chair or from a sitting position or going up and down stairs she has bilateral knee pain and stiffness.  She says the alternate in terms of which one hurts worse.  She denies any injuries to the knees ever.  She is 73 years old.  She points the medial aspect of her knees is main source of her pain.  HPI  Review of Systems She currently denies any headache, chest pain, shortness of breath, fever, chills, nausea, vomiting  Objective: Vital Signs: There were no vitals taken for this visit.  Physical Exam She is alert and orient x3 and in no acute distress Ortho Exam Examination of both knees shows excellent range of motion of both knees.  There is slight patellofemoral crepitation bilaterally but only slight.  There is no knee joint  effusion bilaterally.  Both knees have slight varus malalignment and both knees have medial joint line tenderness full range of motion.  Both knees are ligamentously stable. Specialty Comments:  No specialty comments available.  Imaging: Xr Knee 1-2 Views Left  Result Date: 03/12/2018 2 views of the left knee show no acute findings.  There is mild to moderate arthritic changes.  Xr Knee 1-2 Views Right  Result Date: 03/12/2018 2 views of the right knee show no acute findings.  There is mild to moderate arthritic changes throughout the knee.    PMFS History: Patient Active Problem List   Diagnosis Date Noted  . Headache 02/11/2018  . 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 Thoracic back pain 07/31/2017  . Neck pain on right side 07/31/2017  . Tremor 05/23/2017  . Left hip pain 05/23/2017  . Snoring 05/23/2017  . Knee pain, bilateral 05/15/2016  . Low vitamin D level 01/09/2016  . Hyponatremia 08/02/2014  . Fatigue 08/02/2014  . Cough 01/19/2014  . Nausea without vomiting 01/19/2014  . Atypical chest pain 01/19/2014  . Anterior cervical lymphadenopathy 02/08/2013  . Allergic state 06/24/2012  . Hypokalemia 06/24/2012  . Dry mouth 03/13/2012  . Preventative health care 03/13/2012  . Shoulder pain, bilateral 06/14/2011  . Arthritis 04/02/2011  . IBS (irritable bowel syndrome) 04/02/2011  . Overactive bladder 04/02/2011  . Hypertension   . Diverticulosis of colon   . Overweight   . Osteopenia   . History of fibrocystic disease of breast   . CHRONIC RHINITIS 01/13/2009  . Hyperlipidemia, mixed 02/20/2007  . GERD 02/20/2007   Past Medical History:  Diagnosis Date  . Allergic state 06/24/2012  . Anterior cervical lymphadenopathy 02/08/2013  . Arthritis 04/02/2011  . Bronchitis 04/02/2011  . Chicken pox as a child  . Diverticulosis of colon    colonoscopy 04/14/1999  . GERD (gastroesophageal reflux disease)   . History of fibrocystic disease of breast   .  Hyperlipidemia, mixed 02/20/2007   Followed as Primary Care Patient/ [facility]/ [facility]  Goal LDL < 130 HBP/Pos fm hx      . Hypertension   . Hypokalemia 06/24/2012  . Hyponatremia 08/02/2014  . IBS (irritable bowel syndrome) 04/02/2011  . Measles as a child  . Mumps as a child  . Obesity   . Osteopenia   . Overactive bladder 04/02/2011  . Overweight   . Preventative health care 03/13/2012  . Shoulder pain, bilateral 06/14/2011    Family History  Problem Relation Age of Onset  . Stroke Mother 57  . Hyperlipidemia Mother   . Hypertension Mother   . Heart attack Father 67  . Hypertension Father   . Heart disease Father   . Hypertension Brother   . Aneurysm Brother        aortic  . Hyperlipidemia Brother   . Heart disease Brother  aortic aneurysm rupture  . Multiple sclerosis Daughter   . Alcohol abuse Son   . Schizophrenia Son   . Bipolar disorder Son   . Mental illness Son        schizophrenic, bipolar    Past Surgical History:  Procedure Laterality Date  . BREAST REDUCTION SURGERY  1986  . BUNIONECTOMY Bilateral 1980  . CATARACT EXTRACTION, BILATERAL    . HAMMER TOE SURGERY Right 12-03-13   foot  . MOUTH SURGERY  01-10-13   negative  . TONSILLECTOMY     Social History   Occupational History  . Occupation: dental hygenist  Tobacco Use  . Smoking status: Never Smoker  . Smokeless tobacco: Never Used  Substance and Sexual Activity  . Alcohol use: No  . Drug use: No  . Sexual activity: Not Currently

## 2018-03-15 ENCOUNTER — Other Ambulatory Visit: Payer: Self-pay | Admitting: Family Medicine

## 2018-03-25 ENCOUNTER — Other Ambulatory Visit: Payer: Self-pay | Admitting: Family Medicine

## 2018-03-28 ENCOUNTER — Encounter: Payer: Self-pay | Admitting: Pulmonary Disease

## 2018-03-28 ENCOUNTER — Ambulatory Visit: Payer: PPO | Admitting: Pulmonary Disease

## 2018-03-28 DIAGNOSIS — R5382 Chronic fatigue, unspecified: Secondary | ICD-10-CM

## 2018-03-28 DIAGNOSIS — R0683 Snoring: Secondary | ICD-10-CM | POA: Diagnosis not present

## 2018-03-28 NOTE — Patient Instructions (Signed)
We discussed evaluation and treatment options for sleep apnea. Schedule home sleep testing Other cause of sleepiness could be metoprolol since you are on the XL formulation and perhaps decreasing this to once a day might decrease the level of fatigue

## 2018-03-28 NOTE — Assessment & Plan Note (Signed)
We discussed evaluation and treatment options for sleep apnea. Schedule home sleep testing  Given excessive daytime somnolence, narrow pharyngeal exam, witnessed apneas &  snoring, obstructive sleep apnea is very likely & an overnight polysomnogram will be scheduled as a split study. The pathophysiology of obstructive sleep apnea , it's cardiovascular consequences & modes of treatment including CPAP were discused with the patient in detail & they evidenced understanding.

## 2018-03-28 NOTE — Assessment & Plan Note (Signed)
TSH nml Other cause of sleepiness could be metoprolol since you are on the XL formulation and perhaps decreasing this to once a day might decrease the level of fatigue

## 2018-03-28 NOTE — Progress Notes (Signed)
Subjective:    Patient ID: Erika [name], female    DOB: 01/20/1946, 73 y.o.   MRN: [id number]  HPI  Chief Complaint  Patient presents with  . Sleep Consult    Referred by Dr. Charlett Blake for increased fatigue. States she had a HST several years ago.    73 year old woman referred for evaluation of sleep disordered breathing. She has difficult to control hypertension requiring 4 medications.  She reports increasing fatigue and somnolence for the last 2 years.  She has had TIA episodes over the last few years.  TSH was normal Epworth sleepiness score is 12 and she reports sleepiness while sitting and reading, watching TV, sitting inactive in a public place or sitting quietly after lunch.  She has always taken a 30-minute nap and still enjoys this.  Naps are refreshing.  She is a retired Copywriter, advertising and has raised 5 grandkids  Her husband has noted mild snoring for many years. Bedtime is around 10 PM, sleep latency is minimal, she sleeps on her side with one pillow, reports 3-4 nocturnal awakenings including nocturia and is out of bed by 6:30 AM feeling rested without dryness of mouth.  Lately she has developed morning headaches that sometimes can persist into afternoon and requires Tylenol to improve. Her weight is increased by 8 pounds over the past 2 years. She admits to drinking at least a gallon of sweet tea daily.  There is no history suggestive of cataplexy, sleep paralysis or parasomnias   MRA 02/2018 was reported normal On medication review I note that she is taking Toprol-XL twice daily   Past Medical History:  Diagnosis Date  . Allergic state 06/24/2012  . Anterior cervical lymphadenopathy 02/08/2013  . Arthritis 04/02/2011  . Bronchitis 04/02/2011  . Chicken pox as a child  . Diverticulosis of colon    colonoscopy 04/14/1999  . GERD (gastroesophageal reflux disease)   . History of fibrocystic disease of breast   . Hyperlipidemia, mixed 02/20/2007   Followed as Primary  Care Patient/ [facility]/ [facility]  Goal LDL < 130 HBP/Pos fm hx      . Hypertension   . Hypokalemia 06/24/2012  . Hyponatremia 08/02/2014  . IBS (irritable bowel syndrome) 04/02/2011  . Measles as a child  . Mumps as a child  . Obesity   . Osteopenia   . Overactive bladder 04/02/2011  . Overweight   . Preventative health care 03/13/2012  . Shoulder pain, bilateral 06/14/2011   Past Surgical History:  Procedure Laterality Date  . BREAST REDUCTION SURGERY  1986  . BUNIONECTOMY Bilateral 1980  . CATARACT EXTRACTION, BILATERAL    . HAMMER TOE SURGERY Right 12-03-13   foot  . MOUTH SURGERY  01-10-13   negative  . TONSILLECTOMY      No Known Allergies  Social History   Socioeconomic History  . Marital status: Married    Spouse name: Not on file  . Number of children: 4  . Years of education: Not on file  . Highest education level: Not on file  Occupational History  . Occupation: dental hygenist  Social Needs  . Financial resource strain: Not on file  . Food insecurity:    Worry: Not on file    Inability: Not on file  . Transportation needs:    Medical: Not on file    Non-medical: Not on file  Tobacco Use  . Smoking status: Never Smoker  . Smokeless tobacco: Never Used  Substance and Sexual Activity  .  Alcohol use: No  . Drug use: No  . Sexual activity: Not Currently  Lifestyle  . Physical activity:    Days per week: Not on file    Minutes per session: Not on file  . Stress: Not on file  Relationships  . Social connections:    Talks on phone: Not on file    Gets together: Not on file    Attends religious service: Not on file    Active member of club or organization: Not on file    Attends meetings of clubs or organizations: Not on file    Relationship status: Not on file  . Intimate partner violence:    Fear of current or ex partner: Not on file    Emotionally abused: Not on file    Physically abused: Not on file    Forced sexual activity: Not on file    Other Topics Concern  . Not on file  Social History Narrative   4 children 2 are adopted      Family History  Problem Relation Age of Onset  . Stroke Mother 71  . Hyperlipidemia Mother   . Hypertension Mother   . Heart attack Father 75  . Hypertension Father   . Heart disease Father   . Hypertension Brother   . Aneurysm Brother        aortic  . Hyperlipidemia Brother   . Heart disease Brother        aortic aneurysm rupture  . Multiple sclerosis Daughter   . Alcohol abuse Son   . Schizophrenia Son   . Bipolar disorder Son   . Mental illness Son        schizophrenic, bipolar     Review of Systems  Constitutional: Negative for fever and unexpected weight change.  HENT: Positive for congestion and sneezing. Negative for dental problem, ear pain, nosebleeds, postnasal drip, rhinorrhea, sinus pressure, sore throat and trouble swallowing.   Eyes: Negative for redness and itching.  Respiratory: Positive for cough. Negative for chest tightness, shortness of breath and wheezing.   Cardiovascular: Negative for palpitations and leg swelling.  Gastrointestinal: Negative for nausea and vomiting.  Genitourinary: Negative for dysuria.  Musculoskeletal: Negative for joint swelling.  Skin: Negative for rash.  Allergic/Immunologic: Negative.  Negative for environmental allergies, food allergies and immunocompromised state.  Neurological: Negative for headaches.  Hematological: Does not bruise/bleed easily.  Psychiatric/Behavioral: Negative for dysphoric mood. The patient is not nervous/anxious.        Objective:   Physical Exam   Gen. Pleasant, well-nourished, in no distress, normal affect ENT - no pallor,icterus, no post nasal drip, class 2 airway, normal bite Neck: No JVD, no thyromegaly, no carotid bruits Lungs: no use of accessory muscles, no dullness to percussion, clear without rales or rhonchi  Cardiovascular: Rhythm regular, heart sounds  normal, no murmurs or gallops,  no peripheral edema Abdomen: soft and non-tender, no hepatosplenomegaly, BS normal. Musculoskeletal: No deformities, no cyanosis or clubbing Neuro:  alert, non focal      Assessment & Plan:

## 2018-03-30 ENCOUNTER — Other Ambulatory Visit: Payer: Self-pay | Admitting: Family Medicine

## 2018-04-09 ENCOUNTER — Telehealth (INDEPENDENT_AMBULATORY_CARE_PROVIDER_SITE_OTHER): Payer: Self-pay

## 2018-04-09 ENCOUNTER — Encounter (INDEPENDENT_AMBULATORY_CARE_PROVIDER_SITE_OTHER): Payer: Self-pay | Admitting: Orthopaedic Surgery

## 2018-04-09 ENCOUNTER — Ambulatory Visit (INDEPENDENT_AMBULATORY_CARE_PROVIDER_SITE_OTHER): Payer: PPO | Admitting: Orthopaedic Surgery

## 2018-04-09 DIAGNOSIS — M25511 Pain in right shoulder: Secondary | ICD-10-CM

## 2018-04-09 MED ORDER — METHYLPREDNISOLONE ACETATE 40 MG/ML IJ SUSP
40.0000 mg | INTRAMUSCULAR | Status: AC | PRN
  Administered 2018-04-09: 40 mg via INTRA_ARTICULAR

## 2018-04-09 MED ORDER — LIDOCAINE HCL 1 % IJ SOLN
3.0000 mL | INTRAMUSCULAR | Status: AC | PRN
  Administered 2018-04-09: 3 mL

## 2018-04-09 NOTE — Telephone Encounter (Signed)
Submitted VOB for Monovisc, bilateral knee. 

## 2018-04-09 NOTE — Telephone Encounter (Signed)
Reminder bilateral gel injections for her please

## 2018-04-09 NOTE — Telephone Encounter (Signed)
Noted.   Submitted.  

## 2018-04-09 NOTE — Progress Notes (Signed)
Office Visit Note   Patient: Erika [name]           Date of Birth: Jul 26, 1945           MRN: [id number] Visit Date: 04/09/2018              Requested by: [name], MD [address] [place], [place] [address] PCP: [name], MD   Assessment & Plan: Visit Diagnoses:  1. Acute pain of right shoulder     Plan: We will look into getting approval for the hyaluronic acid for her knees and hopefully can provide those injections in a week from now.  I did talk to her about getting her shoulder moving and trying a subacromial steroid injection the right shoulder to help with her pain.  She agreed with this and tolerated it well.  We do see her back in a week if she still having shoulder issues I would like to get 3 views of the right shoulder and then hopefully place hyaluronic acid into her knees.  All questions and concerns were answered and addressed.  Follow-Up Instructions: Return in about 1 week (around 04/16/2018).   Orders:  Orders Placed This Encounter  Procedures  . Large Joint Inj   No orders of the defined types were placed in this encounter.     Procedures: Large Joint Inj: R subacromial bursa on 04/09/2018 8:52 AM Indications: pain and diagnostic evaluation Details: 22 G 1.5 in needle  Arthrogram: No  Medications: 3 mL lidocaine 1 %; 40 mg methylPREDNISolone acetate 40 MG/ML Outcome: tolerated well, no immediate complications Procedure, treatment alternatives, risks and benefits explained, specific risks discussed. Consent was given by the patient. Immediately prior to procedure a time out was called to verify the correct patient, procedure, equipment, support staff and site/side marked as required. Patient was prepped and draped in the usual sterile fashion.       Clinical Data: No additional findings.   Subjective: Chief Complaint  Patient presents with  . Left Knee - Pain  . Right Knee - Pain  The patient is following up 1 month  after I provided steroid injections in both knees.  She does have mild to moderate osteoarthritis of both knees.  The left knee is doing well but the right knee is still hurting some.  Today we were hoping to place hyaluronic acid in both knees but that has not been approved yet.  However she would like me to see her for her right shoulder today.  She was lifting a 5 pound bucket on Friday and felt something happen to her right dominant shoulder.  She is 73 years old.  He has had problems lifting above her head and doing her hair and reaching behind her since then with that shoulder has been very painful.  She denies any neck pain and she denies any numbness and tingling in her hand.  She said she has had some shoulder issues over time but nothing to this degree.  HPI  Review of Systems She currently denies any headache, chest pain, shortness of breath, fever, chills, nausea, vomiting  Objective: Vital Signs: There were no vitals taken for this visit.  Physical Exam She is alert and orient x3 and in no acute distress Ortho Exam Examination of her right shoulder shows weakness in the rotator cuff.  She is using her deltoids to abduct her shoulder.  There is weakness with external rotation as well. Specialty Comments:  No specialty comments available.  Imaging: No results found.   PMFS History: Patient Active Problem List   Diagnosis Date Noted  . Headache 02/11/2018  . 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 Thoracic back pain 07/31/2017  . Neck pain on right side 07/31/2017  . Tremor 05/23/2017  . Left hip pain 05/23/2017  . Snoring 05/23/2017  . Knee pain, bilateral 05/15/2016  . Low vitamin D level 01/09/2016  . Hyponatremia 08/02/2014  . Fatigue 08/02/2014  . Cough 01/19/2014  . Atypical chest pain 01/19/2014  . Anterior cervical lymphadenopathy 02/08/2013  . Allergic state 06/24/2012  . Hypokalemia 06/24/2012  . Dry mouth 03/13/2012  . Preventative health care  03/13/2012  . Shoulder pain, bilateral 06/14/2011  . Arthritis 04/02/2011  . IBS (irritable bowel syndrome) 04/02/2011  . Overactive bladder 04/02/2011  . Hypertension   . Diverticulosis of colon   . Overweight   . Osteopenia   . History of fibrocystic disease of breast   . CHRONIC RHINITIS 01/13/2009  . Hyperlipidemia, mixed 02/20/2007  . GERD 02/20/2007   Past Medical History:  Diagnosis Date  . Allergic state 06/24/2012  . Anterior cervical lymphadenopathy 02/08/2013  . Arthritis 04/02/2011  . Bronchitis 04/02/2011  . Chicken pox as a child  . Diverticulosis of colon    colonoscopy 04/14/1999  . GERD (gastroesophageal reflux disease)   . History of fibrocystic disease of breast   . Hyperlipidemia, mixed 02/20/2007   Followed as Primary Care Patient/ [facility]/ [facility]  Goal LDL < 130 HBP/Pos fm hx      . Hypertension   . Hypokalemia 06/24/2012  . Hyponatremia 08/02/2014  . IBS (irritable bowel syndrome) 04/02/2011  . Measles as a child  . Mumps as a child  . Obesity   . Osteopenia   . Overactive bladder 04/02/2011  . Overweight   . Preventative health care 03/13/2012  . Shoulder pain, bilateral 06/14/2011    Family History  Problem Relation Age of Onset  . Stroke Mother 52  . Hyperlipidemia Mother   . Hypertension Mother   . Heart attack Father 29  . Hypertension Father   . Heart disease Father   . Hypertension Brother   . Aneurysm Brother        aortic  . Hyperlipidemia Brother   . Heart disease Brother        aortic aneurysm rupture  . Multiple sclerosis Daughter   . Alcohol abuse Son   . Schizophrenia Son   . Bipolar disorder Son   . Mental illness Son        schizophrenic, bipolar    Past Surgical History:  Procedure Laterality Date  . BREAST REDUCTION SURGERY  1986  . BUNIONECTOMY Bilateral 1980  . CATARACT EXTRACTION, BILATERAL    . HAMMER TOE SURGERY Right 12-03-13   foot  . MOUTH SURGERY  01-10-13   negative  . TONSILLECTOMY     Social History    Occupational History  . Occupation: dental hygenist  Tobacco Use  . Smoking status: Never Smoker  . Smokeless tobacco: Never Used  Substance and Sexual Activity  . Alcohol use: No  . Drug use: No  . Sexual activity: Not Currently

## 2018-04-12 ENCOUNTER — Telehealth (INDEPENDENT_AMBULATORY_CARE_PROVIDER_SITE_OTHER): Payer: Self-pay

## 2018-04-12 ENCOUNTER — Telehealth (INDEPENDENT_AMBULATORY_CARE_PROVIDER_SITE_OTHER): Payer: Self-pay | Admitting: Physician Assistant

## 2018-04-12 NOTE — Telephone Encounter (Signed)
Patient is approved for Monovisc, bilateral knee. [place] Patient will be responsible for 20% of the allowable amount. Co-pay of $30.00 No PA required  Appt.04/16/2018 with Dr. Ninfa Linden

## 2018-04-12 NOTE — Telephone Encounter (Signed)
Patient called back to r/s cancelled appt.

## 2018-04-16 ENCOUNTER — Ambulatory Visit (INDEPENDENT_AMBULATORY_CARE_PROVIDER_SITE_OTHER): Payer: PPO | Admitting: Orthopaedic Surgery

## 2018-04-18 ENCOUNTER — Ambulatory Visit (INDEPENDENT_AMBULATORY_CARE_PROVIDER_SITE_OTHER): Payer: PPO | Admitting: Physician Assistant

## 2018-04-18 ENCOUNTER — Encounter (INDEPENDENT_AMBULATORY_CARE_PROVIDER_SITE_OTHER): Payer: Self-pay | Admitting: Physician Assistant

## 2018-04-18 DIAGNOSIS — M1712 Unilateral primary osteoarthritis, left knee: Secondary | ICD-10-CM

## 2018-04-18 DIAGNOSIS — M1711 Unilateral primary osteoarthritis, right knee: Secondary | ICD-10-CM

## 2018-04-18 NOTE — Progress Notes (Signed)
Mrs. Rorrer is here today for Monovisc injections both knees.  She has moderate osteoarthritis of both knees.  She has had cortisone injections in her knees and states right now she is having no pain at this point time.  She like to hold on Monovisc injections therefore we will not charge her for today's office visit.  She will call us if she would like to proceed with Monovisc injections in the future.

## 2018-04-23 ENCOUNTER — Telehealth: Payer: Self-pay | Admitting: Neurology

## 2018-04-23 ENCOUNTER — Ambulatory Visit: Payer: PPO | Admitting: Neurology

## 2018-04-23 ENCOUNTER — Encounter: Payer: Self-pay | Admitting: Neurology

## 2018-04-23 VITALS — BP 161/61 | HR 52 | Ht 66.0 in | Wt 178.0 lb

## 2018-04-23 DIAGNOSIS — R42 Dizziness and giddiness: Secondary | ICD-10-CM

## 2018-04-23 DIAGNOSIS — G4489 Other headache syndrome: Secondary | ICD-10-CM

## 2018-04-23 DIAGNOSIS — R29898 Other symptoms and signs involving the musculoskeletal system: Secondary | ICD-10-CM

## 2018-04-23 NOTE — Patient Instructions (Signed)
Your neurological exam looks good.  You should pursue sleep testing as planned.  Try to get enough rest. Try to hydrate better water. Please try to limit your sweet tea to about 1-2 servings if possible.  We will do a brain scan, called MRI and call you with the test results. We will have to schedule you for this on a separate date. This test requires authorization from your insurance, and we will take care of the insurance process.

## 2018-04-23 NOTE — Progress Notes (Signed)
Subjective:    Patient ID: Erika [name] is a 73 y.o. female.  HPI     [name], MD, [name] [facility] [address], [address] [place], [place]  [address]  Dear Dr. Charlett Blake,   I saw your patient, Erika [name], upon your kind request in my neurologic clinic today for initial consultation of her recurrent headaches. The patient is unaccompanied today. As you know, Erika [name] is a 73 year old right-handed woman with an underlying medical history of arthritis, particularly shoulder pain bilaterally, osteopenia, irritable bowel syndrome, hypertension, hyperlipidemia, reflux disease, diverticulosis, and obesity, who reports episodic headaches in the fall of last year and also had episodes of dizziness and some weakness in the L hand in December, which lasted maybe for seconds, all of which have improved. She currently feels at baseline. She does not smoke, does not typically utilize alcohol, drinks caffeine regularly, in the form of sweet tea at least 4-5 cups per day and one cup of coffee in the morning typically. She admits that she does not typically drink a lot of water. She has not had an MRI brain, as far as I can see. She may have had some difficulty speaking, also for seconds (?).  She has a FHx of stroke in her mother.  She has been sleepy during the day. She has had a sleep evaluation and has sleep test pending.  Has been quite busy, retired as Copywriter, advertising some 4 years ago. 2 daughters live next door practically, 2 adopted sons. She has 6 GC, takes care of 5 of her GC on a daily basis or helps out in one form or another. She has had stressors. She has had some memory issues.   I reviewed your office note from 02/11/2018. 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   No flow-limiting intracranial stenosis is evident. She is in the process of being evaluated for sleep apnea.   Her Past Medical History Is Significant For: Past Medical History:  Diagnosis Date  . Allergic state 06/24/2012  . Anterior cervical lymphadenopathy 02/08/2013  . Arthritis 04/02/2011  . Bronchitis 04/02/2011  . Chicken pox as a child  . Diverticulosis of colon    colonoscopy 04/14/1999  . GERD (gastroesophageal reflux disease)   . History of fibrocystic disease of breast   . Hyperlipidemia, mixed 02/20/2007   Followed as Primary Care Patient/ [facility]/ [facility]  Goal LDL < 130 HBP/Pos fm hx      . Hypertension   . Hypokalemia 06/24/2012  . Hyponatremia 08/02/2014  . IBS (irritable bowel syndrome) 04/02/2011  . Measles as a child  . Mumps as a child  . Obesity   . Osteopenia   . Overactive bladder 04/02/2011  . Overweight   . Preventative health care 03/13/2012  . Shoulder pain, bilateral 06/14/2011    Her Past Surgical History Is Significant For: Past Surgical History:  Procedure Laterality Date  . BREAST REDUCTION SURGERY  1986  . BUNIONECTOMY Bilateral 1980  . CATARACT EXTRACTION, BILATERAL    . HAMMER TOE SURGERY Right 12-03-13   foot  . MOUTH SURGERY  01-10-13   negative  . TONSILLECTOMY      Her Family History Is Significant For: Family History  Problem Relation Age of Onset  . Stroke  Mother 37  . Hyperlipidemia Mother   . Hypertension Mother   . Heart attack Father 81  . Hypertension Father   . Heart disease Father   . Hypertension Brother   . Aneurysm Brother        aortic  . Hyperlipidemia Brother   . Heart disease Brother        aortic aneurysm rupture  . Multiple sclerosis Daughter   . Alcohol abuse Son   . Schizophrenia Son   . Bipolar disorder Son   . Mental illness Son        schizophrenic, bipolar    Her Social History Is Significant For: Social History    Socioeconomic History  . Marital status: Married    Spouse name: Not on file  . Number of children: 4  . Years of education: Not on file  . Highest education level: Not on file  Occupational History  . Occupation: dental hygenist  Social Needs  . Financial resource strain: Not on file  . Food insecurity:    Worry: Not on file    Inability: Not on file  . Transportation needs:    Medical: Not on file    Non-medical: Not on file  Tobacco Use  . Smoking status: Never Smoker  . Smokeless tobacco: Never Used  Substance and Sexual Activity  . Alcohol use: No  . Drug use: No  . Sexual activity: Not Currently  Lifestyle  . Physical activity:    Days per week: Not on file    Minutes per session: Not on file  . Stress: Not on file  Relationships  . Social connections:    Talks on phone: Not on file    Gets together: Not on file    Attends religious service: Not on file    Active member of club or organization: Not on file    Attends meetings of clubs or organizations: Not on file    Relationship status: Not on file  Other Topics Concern  . Not on file  Social History Narrative   4 children 2 are adopted    Her Allergies Are:  No Known Allergies:   Her Current Medications Are:  Outpatient Encounter Medications as of 04/23/2018  Medication Sig  . amLODipine (NORVASC) 10 MG tablet TAKE 1/2 TABLET BY MOUTH TWICE A DAY  . aspirin 325 MG EC tablet Take 325 mg by mouth daily.  Marland Kitchen atorvastatin (LIPITOR) 10 MG tablet Take 1 tablet (10 mg total) by mouth daily at 6 PM.  . calcium carbonate (TUMS) 500 MG chewable tablet Chew 1 tablet by mouth as needed.   . cetirizine (ZYRTEC) 10 MG tablet Take 10 mg by mouth at bedtime as needed.    . Cholecalciferol (VITAMIN D PO) Take 1 capsule by mouth daily.  . cycloSPORINE (RESTASIS) 0.05 % ophthalmic emulsion Place 1 drop into both eyes 2 (two) times daily.  . metoprolol succinate (TOPROL-XL) 50 MG 24 hr tablet TAKE 1 TABLET BY MOUTH TWICE  A DAY WITH OR IMMEDIATELY FOLLOWING A MEAL  . Multiple Minerals-Vitamins (CITRACAL PLUS PO) Take 1 tablet by mouth daily.    Marland Kitchen omeprazole (PRILOSEC) 20 MG capsule TAKE ONE CAPSULE BY MOUTH EVERY DAY  . ranitidine (ZANTAC) 300 MG tablet TAKE 1 TABLET (300 MG TOTAL) BY MOUTH AT BEDTIME.  Marland Kitchen tiZANidine (ZANAFLEX) 2 MG tablet Take 1-2 tablets (2-4 mg total) by mouth at bedtime as needed for muscle spasms.  Marland Kitchen triamterene-hydrochlorothiazide (MAXZIDE-25) 37.5-25 MG tablet Take 1 tablet  by mouth daily.  . trospium (SANCTURA) 20 MG tablet Take 20 mg by mouth daily.    . valsartan (DIOVAN) 320 MG tablet TAKE 1 TABLET BY MOUTH EVERY DAY  . Vitamin D, Ergocalciferol, (DRISDOL) 1.25 MG (50000 UT) CAPS capsule TAKE 1 CAPSULE (50,000 UNITS TOTAL) BY MOUTH EVERY 7 (SEVEN) DAYS.   No facility-administered encounter medications on file as of 04/23/2018.   : Review of Systems:  Out of a complete 14 point review of systems, all are reviewed and negative with the exception of these symptoms as listed below:  Review of Systems  Neurological:       Pt presents today to discuss a possible TIA she had in the fall of last year. After the TIA-like episode, she had headaches, dizziness, and weakness. All of these symptoms are resolving.    Objective:  Neurological Exam  Physical Exam Physical Examination:   Vitals:   04/23/18 0947  BP: (!) 161/61  Pulse: (!) 52    General Examination: The patient is a very pleasant 73 y.o. female in no acute distress. She appears well-developed and well-nourished and well groomed. No dizziness or vertiginous symptoms reported.  HEENT: Normocephalic, atraumatic, pupils are equal, round and reactive to light and accommodation. Funduscopic exam is normal with sharp disc margins noted. Status post bilateral cataract extractions. Extraocular tracking is good without limitation to gaze excursion or nystagmus noted. Normal smooth pursuit is noted. Hearing is grossly intact. Face is  symmetric with normal facial animation and normal facial sensation. Speech is clear with no dysarthria noted. There is no hypophonia. There is no lip, neck/head, jaw or voice tremor. Neck is supple with full range of passive and active motion. There are no carotid bruits on auscultation. Oropharynx exam reveals: mild mouth dryness, adequate dental hygiene and mild airway crowding. Tongue protrudes centrally and palate elevates symmetrically.   Chest: Clear to auscultation without wheezing, rhonchi or crackles noted.  Heart: S1+S2+0, regular and normal without murmurs, rubs or gallops noted.   Abdomen: Soft, non-tender and non-distended with normal bowel sounds appreciated on auscultation.  Extremities: There is no pitting edema in the distal lower extremities bilaterally. Pedal pulses are intact.  Skin: Warm and dry without trophic changes noted.  Musculoskeletal: exam reveals prominent arthritic changes in both hands, she also has bilateral knee discomfort. Has received injections into both knees. She also received injection into the right shoulder.   Neurologically:  Mental status: The patient is awake, alert and oriented in all 4 spheres. Her immediate and remote memory, attention, language skills and fund of knowledge are appropriate. There is no evidence of aphasia, agnosia, apraxia or anomia. Speech is clear with normal prosody and enunciation. Thought process is linear. Mood is normal and affect is normal.  Cranial nerves II - XII are as described above under HEENT exam. In addition: shoulder shrug is normal with equal shoulder height noted. Motor exam: Normal bulk, strength and tone is noted. There is no drift, tremor or rebound. Romberg is negative. Reflexes are 2+ throughout. Babinski: Toes are flexor bilaterally. Fine motor skills and coordination: intact with normal finger taps, normal hand movements, normal rapid alternating patting, normal foot taps and normal foot agility.   Cerebellar testing: No dysmetria or intention tremor on finger to nose testing. Heel to shin is unremarkable bilaterally. There is no truncal or gait ataxia.  Sensory exam: intact to light touch.  Gait, station and balance: She stands easily. No veering to one side is noted. No leaning  to one side is noted. Posture is age-appropriate and stance is narrow based. Gait shows normal stride length and normal pace. No problems turning are noted. Tandem walk is unremarkable.   Assessment and Plan:    In summary, Erika [name] is a very pleasant 73 y.o.-year old female with an underlying medical history of arthritis, particularly shoulder pain bilaterally, osteopenia, irritable bowel syndrome, hypertension, hyperlipidemia, reflux disease, diverticulosis, and obesity, who presents for evaluation of her recurrent headaches, associated with some dizziness at times, also a single episode of rather brief left hand weakness which lasted maybe only seconds in early December. All of her symptoms have improved or are still improving, which is very reassuring. She has a nonfocal neurological exam thankfully. She had a benign brain MRA without contrast recently, I do not see where she actually had an MRI brain. I would like to proceed with a brain MRI without contrast. Her left hand weakness and associated speech impairment lasted only a few seconds and would be rather unusual for a TIA presentation. She does have some vascular risk factors including hypertension and hyperlipidemia. Therefore, a brain MRI is reasonable. We will call her with her test results. She is advised to continue with all her medications and also follow through with her sleep study which is pending. So long as her MRI shows benign and age-appropriate results, I will see her back on an as-needed basis. I answered all her questions today and the patient was in agreement. Thank you very much for allowing me to participate in the care of this nice  patient. If I can be of any further assistance to you please do not hesitate to call me at [phone].  Sincerely,   [name], MD, [name]

## 2018-04-23 NOTE — Telephone Encounter (Signed)
[facility] no auth. [facility]. I sent it there because she has a lot of her images there. They will reach out to the patient to schedule.

## 2018-04-24 NOTE — Telephone Encounter (Signed)
Patient is scheduled at [facility] for 04/27/18

## 2018-04-27 ENCOUNTER — Ambulatory Visit (HOSPITAL_BASED_OUTPATIENT_CLINIC_OR_DEPARTMENT_OTHER)
Admission: RE | Admit: 2018-04-27 | Discharge: 2018-04-27 | Disposition: A | Discharge status: Home / Self Care | Payer: PPO | Source: Ambulatory Visit | Attending: Neurology | Admitting: Neurology

## 2018-04-27 DIAGNOSIS — G4489 Other headache syndrome: Secondary | ICD-10-CM | POA: Diagnosis not present

## 2018-04-27 DIAGNOSIS — R42 Dizziness and giddiness: Secondary | ICD-10-CM | POA: Diagnosis not present

## 2018-04-27 DIAGNOSIS — R29898 Other symptoms and signs involving the musculoskeletal system: Secondary | ICD-10-CM

## 2018-04-27 DIAGNOSIS — R51 Headache: Secondary | ICD-10-CM | POA: Diagnosis not present

## 2018-04-29 ENCOUNTER — Telehealth: Payer: Self-pay

## 2018-04-29 NOTE — Telephone Encounter (Signed)
I called and pt discussed her MRI results and Dr. Guadelupe Sabin recommendations for her. Pt agrees with the recommendations and will follow up PRN. Pt verbalized understanding of results. Pt had no questions at this time but was encouraged to call back if questions arise.

## 2018-04-29 NOTE — Telephone Encounter (Signed)
-----   Message from [name], MD sent at 04/29/2018  8:35 AM EST ----- Please call patient regarding the recent brain MRI: The brain scan showed a normal structure of the brain and no significant volume loss which we call atrophy. There were changes in the deeper structures of the brain, which we call white matter changes or microvascular changes. These were reported as moderate severity in Her case. These are tiny white spots, that occur with time and are seen in a variety of conditions, including with normal aging, chronic hypertension, chronic headaches, especially migraine HAs, chronic diabetes, chronic hyperlipidemia. These are not strokes and no mass or lesion were seen which is reassuring. Again, there were no acute findings, such as a stroke, or mass or blood products. No old stroke.  I would like to reinforce the importance of good blood pressure control, good cholesterol control, good blood sugar control, and weight management, sleep eval, which is underway.  She can FU with me prn.  [name], MD, [name]

## 2018-04-29 NOTE — Progress Notes (Signed)
Please call patient regarding the recent brain MRI: The brain scan showed a normal structure of the brain and no significant volume loss which we call atrophy. There were changes in the deeper structures of the brain, which we call white matter changes or microvascular changes. These were reported as moderate severity in Her case. These are tiny white spots, that occur with time and are seen in a variety of conditions, including with normal aging, chronic hypertension, chronic headaches, especially migraine HAs, chronic diabetes, chronic hyperlipidemia. These are not strokes and no mass or lesion were seen which is reassuring. Again, there were no acute findings, such as a stroke, or mass or blood products. No old stroke.  I would like to reinforce the importance of good blood pressure control, good cholesterol control, good blood sugar control, and weight management, sleep eval, which is underway.  She can FU with me prn.  [name], MD, [name]

## 2018-05-08 DIAGNOSIS — G4733 Obstructive sleep apnea (adult) (pediatric): Secondary | ICD-10-CM | POA: Diagnosis not present

## 2018-05-08 DIAGNOSIS — R0683 Snoring: Secondary | ICD-10-CM

## 2018-05-12 ENCOUNTER — Other Ambulatory Visit: Payer: Self-pay | Admitting: Family Medicine

## 2018-05-13 ENCOUNTER — Ambulatory Visit (INDEPENDENT_AMBULATORY_CARE_PROVIDER_SITE_OTHER): Payer: PPO | Admitting: Family Medicine

## 2018-05-13 ENCOUNTER — Encounter: Payer: Self-pay | Admitting: Family Medicine

## 2018-05-13 VITALS — BP 122/56 | HR 58 | Temp 97.8°F | Resp 18 | Wt 177.0 lb

## 2018-05-13 DIAGNOSIS — I10 Essential (primary) hypertension: Secondary | ICD-10-CM | POA: Diagnosis not present

## 2018-05-13 DIAGNOSIS — M25561 Pain in right knee: Secondary | ICD-10-CM | POA: Diagnosis not present

## 2018-05-13 DIAGNOSIS — E871 Hypo-osmolality and hyponatremia: Secondary | ICD-10-CM

## 2018-05-13 DIAGNOSIS — R7989 Other specified abnormal findings of blood chemistry: Secondary | ICD-10-CM | POA: Diagnosis not present

## 2018-05-13 DIAGNOSIS — M25562 Pain in left knee: Secondary | ICD-10-CM

## 2018-05-13 DIAGNOSIS — M8580 Other specified disorders of bone density and structure, unspecified site: Secondary | ICD-10-CM | POA: Diagnosis not present

## 2018-05-13 LAB — COMPREHENSIVE METABOLIC PANEL
ALK PHOS: 81 U/L (ref 39–117)
ALT: 15 U/L (ref 0–35)
AST: 17 U/L (ref 0–37)
Albumin: 4.1 g/dL (ref 3.5–5.2)
BUN: 15 mg/dL (ref 6–23)
CALCIUM: 9.5 mg/dL (ref 8.4–10.5)
CO2: 30 mEq/L (ref 19–32)
Chloride: 95 mEq/L — ABNORMAL LOW (ref 96–112)
Creatinine, Ser: 1.17 mg/dL (ref 0.40–1.20)
GFR: 45.43 mL/min — ABNORMAL LOW (ref >60.00)
GLUCOSE: 77 mg/dL (ref 70–99)
POTASSIUM: 3.8 meq/L (ref 3.5–5.1)
Sodium: 132 mEq/L — ABNORMAL LOW (ref 135–145)
TOTAL PROTEIN: 6.7 g/dL (ref 6.0–8.3)
Total Bilirubin: 0.7 mg/dL (ref 0.2–1.2)

## 2018-05-13 NOTE — Patient Instructions (Addendum)
SHINGRIX is the new shingles shot, 2 shots over 2-6 months at the pharmacy   Sleep Apnea Sleep apnea is a condition in which breathing pauses or becomes shallow during sleep. Episodes of sleep apnea usually last 10 seconds or longer, and they may occur as many as 20 times an hour. Sleep apnea disrupts your sleep and keeps your body from getting the rest that it needs. This condition can increase your risk of certain health problems, including:  Heart attack.  Stroke.  Obesity.  Diabetes.  Heart failure.  Irregular heartbeat. There are three kinds of sleep apnea:  Obstructive sleep apnea. This kind is caused by a blocked or collapsed airway.  Central sleep apnea. This kind happens when the part of the brain that controls breathing does not send the correct signals to the muscles that control breathing.  Mixed sleep apnea. This is a combination of obstructive and central sleep apnea. What are the causes? The most common cause of this condition is a collapsed or blocked airway. An airway can collapse or become blocked if:  Your throat muscles are abnormally relaxed.  Your tongue and tonsils are larger than normal.  You are overweight.  Your airway is smaller than normal. What increases the risk? This condition is more likely to develop in people who:  Are overweight.  Smoke.  Have a smaller than normal airway.  Are elderly.  Are female.  Drink alcohol.  Take sedatives or tranquilizers.  Have a family history of sleep apnea. What are the signs or symptoms? Symptoms of this condition include:  Trouble staying asleep.  Daytime sleepiness and tiredness.  Irritability.  Loud snoring.  Morning headaches.  Trouble concentrating.  Forgetfulness.  Decreased interest in sex.  Unexplained sleepiness.  Mood swings.  Personality changes.  Feelings of depression.  Waking up often during the night to urinate.  Dry mouth.  Sore throat. How is this  diagnosed? This condition may be diagnosed with:  A medical history.  A physical exam.  A series of tests that are done while you are sleeping (sleep study). These tests are usually done in a sleep lab, but they may also be done at home. How is this treated? Treatment for this condition aims to restore normal breathing and to ease symptoms during sleep. It may involve managing health issues that can affect breathing, such as high blood pressure or obesity. Treatment may include:  Sleeping on your side.  Using a decongestant if you have nasal congestion.  Avoiding the use of depressants, including alcohol, sedatives, and narcotics.  Losing weight if you are overweight.  Making changes to your diet.  Quitting smoking.  Using a device to open your airway while you sleep, such as: ? An oral appliance. This is a custom-made mouthpiece that shifts your lower jaw forward. ? A continuous positive airway pressure (CPAP) device. This device delivers oxygen to your airway through a mask. ? A nasal expiratory positive airway pressure (EPAP) device. This device has valves that you put into each nostril. ? A bi-level positive airway pressure (BPAP) device. This device delivers oxygen to your airway through a mask.  Surgery if other treatments do not work. During surgery, excess tissue is removed to create a wider airway. It is important to get treatment for sleep apnea. Without treatment, this condition can lead to:  High blood pressure.  Coronary artery disease.  (Men) An inability to achieve or maintain an erection (impotence).  Reduced thinking abilities. Follow these instructions at home:  Make any lifestyle changes that your health care provider recommends.  Eat a healthy, well-balanced diet.  Take over-the-counter and prescription medicines only as told by your health care provider.  Avoid using depressants, including alcohol, sedatives, and narcotics.  Take steps to lose  weight if you are overweight.  If you were given a device to open your airway while you sleep, use it only as told by your health care provider.  Do not use any tobacco products, such as cigarettes, chewing tobacco, and e-cigarettes. If you need help quitting, ask your health care provider.  Keep all follow-up visits as told by your health care provider. This is important. Contact a health care provider if:  The device that you received to open your airway during sleep is uncomfortable or does not seem to be working.  Your symptoms do not improve.  Your symptoms get worse. Get help right away if:  You develop chest pain.  You develop shortness of breath.  You develop discomfort in your back, arms, or stomach.  You have trouble speaking.  You have weakness on one side of your body.  You have drooping in your face. These symptoms may represent a serious problem that is an emergency. Do not wait to see if the symptoms will go away. Get medical help right away. Call your local emergency services (911 in the [place] Do not drive yourself to the hospital. This information is not intended to replace advice given to you by your health care provider. Make sure you discuss any questions you have with your health care provider. Document Released: 02/10/2002 Document Revised: 09/18/2016 Document Reviewed: 11/30/2014 Elsevier Interactive Patient Education  2019 [employer].  Adhesive Capsulitis  Adhesive capsulitis, also called frozen shoulder, causes the shoulder to become stiff and painful to move. This condition happens when there is inflammation of the tendons and ligaments that surround the shoulder joint (shoulder capsule). What are the causes? This condition may be caused by:  An injury to your shoulder joint.  Straining your shoulder.  Not moving your shoulder for a period of time. This can happen if your arm was injured or in a sling.  Long-standing conditions, such  as: ? Diabetes. ? Thyroid problems. ? Heart disease. ? Stroke. ? Rheumatoid arthritis. ? Lung disease. In some cases, the cause is not known. What increases the risk? You are more likely to develop this condition if you are:  A woman.  Older than 73 years of age. What are the signs or symptoms? Symptoms of this condition include:  Pain in your shoulder when you move your arm. There may also be pain when parts of your shoulder are touched. The pain may be worse at night or when you are resting.  A sore or aching shoulder.  The inability to move your shoulder normally.  Muscle spasms. How is this diagnosed? This condition is diagnosed with a physical exam and imaging tests, such as an X-ray or MRI. How is this treated? This condition may be treated with:  Treatment of the underlying cause or condition.  Medicine. Medicine may be given to relieve pain, inflammation, or muscle spasms.  Steroid injections into the shoulder joint.  Physical therapy. This involves performing exercises to get the shoulder moving again.  Acupuncture. This is a type of treatment that involves stimulating specific points on your body by inserting thin needles through your skin.  Shoulder manipulation. This is a procedure to move the shoulder into another position. It is done after  you are given a medicine to make you fall asleep (general anesthetic). The joint may also be injected with salt water at high pressure to break down scarring.  Surgery. This may be done in severe cases when other treatments have failed. Although most people recover completely from adhesive capsulitis, some may not regain full shoulder movement. Follow these instructions at home: Managing pain, stiffness, and swelling      If directed, put ice on the injured area: ? Put ice in a plastic bag. ? Place a towel between your skin and the bag. ? Leave the ice on for 20 minutes, 2-3 times per day.  If directed, apply heat  to the affected area before you exercise. Use the heat source that your health care provider recommends, such as a moist heat pack or a heating pad. ? Place a towel between your skin and the heat source. ? Leave the heat on for 20-30 minutes. ? Remove the heat if your skin turns bright red. This is especially important if you are unable to feel pain, heat, or cold. You may have a greater risk of getting burned. General instructions  Take over-the-counter and prescription medicines only as told by your health care provider.  If you are being treated with physical therapy, follow instructions from your physical therapist.  Avoid exercises that put a lot of demand on your shoulder, such as throwing. These exercises can make pain worse.  Keep all follow-up visits as told by your health care provider. This is important. Contact a health care provider if:  You develop new symptoms.  Your symptoms get worse. Summary  Adhesive capsulitis, also called frozen shoulder, causes the shoulder to become stiff and painful to move.  You are more likely to have this condition if you are a woman and over age 62.  It is treated with physical therapy, medicines, and sometimes surgery. This information is not intended to replace advice given to you by your health care provider. Make sure you discuss any questions you have with your health care provider. Document Released: 12/18/2008 Document Revised: 07/27/2017 Document Reviewed: 07/27/2017 Elsevier Interactive Patient Education  2019 [employer].

## 2018-05-15 DIAGNOSIS — G4733 Obstructive sleep apnea (adult) (pediatric): Secondary | ICD-10-CM | POA: Diagnosis not present

## 2018-05-16 DIAGNOSIS — H5211 Myopia, right eye: Secondary | ICD-10-CM | POA: Diagnosis not present

## 2018-05-16 DIAGNOSIS — Z961 Presence of intraocular lens: Secondary | ICD-10-CM | POA: Diagnosis not present

## 2018-05-16 DIAGNOSIS — H5202 Hypermetropia, left eye: Secondary | ICD-10-CM | POA: Diagnosis not present

## 2018-05-16 DIAGNOSIS — H524 Presbyopia: Secondary | ICD-10-CM | POA: Diagnosis not present

## 2018-05-16 DIAGNOSIS — H52222 Regular astigmatism, left eye: Secondary | ICD-10-CM | POA: Diagnosis not present

## 2018-05-17 ENCOUNTER — Telehealth: Payer: Self-pay | Admitting: Pulmonary Disease

## 2018-05-17 NOTE — Telephone Encounter (Signed)
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 Would recommend RX or oral appliance or CPAP (only if increased sleepiness).   Will place orders once patient is aware.

## 2018-05-19 NOTE — Assessment & Plan Note (Signed)
Is following with orthopaedics Dr Ninfa Linden and has responded to some recent injections.

## 2018-05-19 NOTE — Assessment & Plan Note (Signed)
Encouraged to get adequate exercise, calcium and vitamin d intake 

## 2018-05-19 NOTE — Assessment & Plan Note (Signed)
Mild asymptomatic, minimize water intake and recheck with next visit.

## 2018-05-19 NOTE — Assessment & Plan Note (Signed)
Supplement and monitor 

## 2018-05-19 NOTE — Assessment & Plan Note (Signed)
Well controlled, no changes to meds. Encouraged heart healthy diet such as the DASH diet and exercise as tolerated.  °

## 2018-05-19 NOTE — Progress Notes (Signed)
Subjective:    Patient ID: Erika [name], female    DOB: April 08, 1945, 73 y.o.   MRN: [id number]  No chief complaint on file.   HPI Patient is in today for follow-up.  She continues to struggle with joint pain most notably in her right shoulder and both knees.  She has received some injections in her knees from her orthopedist Dr. Ninfa Linden and that has been somewhat helpful. No recent fall or trauma. No recent febrile illness or hospitalizations. Denies CP/palp/SOB/HA/congestion/fevers/GI or GU c/o. Taking meds as prescribed.  Past Medical History:  Diagnosis Date  . Allergic state 06/24/2012  . Anterior cervical lymphadenopathy 02/08/2013  . Arthritis 04/02/2011  . Bronchitis 04/02/2011  . Chicken pox as a child  . Diverticulosis of colon    colonoscopy 04/14/1999  . GERD (gastroesophageal reflux disease)   . History of fibrocystic disease of breast   . Hyperlipidemia, mixed 02/20/2007   Followed as Primary Care Patient/ [facility]/ [facility]  Goal LDL < 130 HBP/Pos fm hx      . Hypertension   . Hypokalemia 06/24/2012  . Hyponatremia 08/02/2014  . IBS (irritable bowel syndrome) 04/02/2011  . Measles as a child  . Mumps as a child  . Obesity   . Osteopenia   . Overactive bladder 04/02/2011  . Overweight   . Preventative health care 03/13/2012  . Shoulder pain, bilateral 06/14/2011    Past Surgical History:  Procedure Laterality Date  . BREAST REDUCTION SURGERY  1986  . BUNIONECTOMY Bilateral 1980  . CATARACT EXTRACTION, BILATERAL    . HAMMER TOE SURGERY Right 12-03-13   foot  . MOUTH SURGERY  01-10-13   negative  . TONSILLECTOMY      Family History  Problem Relation Age of Onset  . Stroke Mother 31  . Hyperlipidemia Mother   . Hypertension Mother   . Heart attack Father 66  . Hypertension Father   . Heart disease Father   . Hypertension Brother   . Aneurysm Brother        aortic  . Hyperlipidemia Brother   . Heart disease Brother        aortic aneurysm rupture  .  Multiple sclerosis Daughter   . Alcohol abuse Son   . Schizophrenia Son   . Bipolar disorder Son   . Mental illness Son        schizophrenic, bipolar    Social History   Socioeconomic History  . Marital status: Married    Spouse name: Not on file  . Number of children: 4  . Years of education: Not on file  . Highest education level: Not on file  Occupational History  . Occupation: dental hygenist  Social Needs  . Financial resource strain: Not on file  . Food insecurity:    Worry: Not on file    Inability: Not on file  . Transportation needs:    Medical: Not on file    Non-medical: Not on file  Tobacco Use  . Smoking status: Never Smoker  . Smokeless tobacco: Never Used  Substance and Sexual Activity  . Alcohol use: No  . Drug use: No  . Sexual activity: Not Currently  Lifestyle  . Physical activity:    Days per week: Not on file    Minutes per session: Not on file  . Stress: Not on file  Relationships  . Social connections:    Talks on phone: Not on file    Gets together: Not on file  Attends religious service: Not on file    Active member of club or organization: Not on file    Attends meetings of clubs or organizations: Not on file    Relationship status: Not on file  . Intimate partner violence:    Fear of current or ex partner: Not on file    Emotionally abused: Not on file    Physically abused: Not on file    Forced sexual activity: Not on file  Other Topics Concern  . Not on file  Social History Narrative   4 children 2 are adopted    Outpatient Medications Prior to Visit  Medication Sig Dispense Refill  . amLODipine (NORVASC) 10 MG tablet TAKE 1/2 TABLET BY MOUTH TWICE A DAY 90 tablet 1  . aspirin 325 MG EC tablet Take 325 mg by mouth daily.    Marland Kitchen atorvastatin (LIPITOR) 10 MG tablet Take 1 tablet (10 mg total) by mouth daily at 6 PM. 90 tablet 2  . calcium carbonate (TUMS) 500 MG chewable tablet Chew 1 tablet by mouth as needed.     . cetirizine  (ZYRTEC) 10 MG tablet Take 10 mg by mouth at bedtime as needed.      . cycloSPORINE (RESTASIS) 0.05 % ophthalmic emulsion Place 1 drop into both eyes 2 (two) times daily.    . metoprolol succinate (TOPROL-XL) 50 MG 24 hr tablet TAKE 1 TABLET BY MOUTH TWICE A DAY WITH OR IMMEDIATELY FOLLOWING A MEAL 270 tablet 1  . Multiple Minerals-Vitamins (CITRACAL PLUS PO) Take 1 tablet by mouth daily.      Marland Kitchen omeprazole (PRILOSEC) 20 MG capsule TAKE ONE CAPSULE BY MOUTH EVERY DAY 90 capsule 2  . ranitidine (ZANTAC) 300 MG tablet TAKE 1 TABLET (300 MG TOTAL) BY MOUTH AT BEDTIME. 30 tablet 2  . tiZANidine (ZANAFLEX) 2 MG tablet Take 1-2 tablets (2-4 mg total) by mouth at bedtime as needed for muscle spasms. 30 tablet 0  . trospium (SANCTURA) 20 MG tablet Take 20 mg by mouth daily.      . valsartan (DIOVAN) 320 MG tablet TAKE 1 TABLET BY MOUTH EVERY DAY 90 tablet 0  . Vitamin D, Ergocalciferol, (DRISDOL) 1.25 MG (50000 UT) CAPS capsule TAKE 1 CAPSULE (50,000 UNITS TOTAL) BY MOUTH EVERY 7 (SEVEN) DAYS. 12 capsule 1  . Cholecalciferol (VITAMIN D PO) Take 1 capsule by mouth daily.    Marland Kitchen triamterene-hydrochlorothiazide (MAXZIDE-25) 37.5-25 MG tablet Take 1 tablet by mouth daily. 90 tablet 1   No facility-administered medications prior to visit.     No Known Allergies  Review of Systems  Constitutional: Positive for malaise/fatigue. Negative for fever.  HENT: Negative for congestion.   Eyes: Negative for blurred vision.  Respiratory: Negative for shortness of breath.   Cardiovascular: Negative for chest pain, palpitations and leg swelling.  Gastrointestinal: Negative for abdominal pain, blood in stool and nausea.  Genitourinary: Negative for dysuria and frequency.  Musculoskeletal: Positive for joint pain. Negative for falls.  Skin: Negative for rash.  Neurological: Negative for dizziness, loss of consciousness and headaches.  Endo/Heme/Allergies: Negative for environmental allergies.  Psychiatric/Behavioral:  Negative for depression. The patient is not nervous/anxious.        Objective:    Physical Exam Vitals signs and nursing note reviewed.  Constitutional:      General: She is not in acute distress.    Appearance: She is well-developed.  HENT:     Head: Normocephalic and atraumatic.     Nose: Nose normal.  Eyes:  General:        Right eye: No discharge.        Left eye: No discharge.  Neck:     Musculoskeletal: Normal range of motion and neck supple.  Cardiovascular:     Rate and Rhythm: Normal rate and regular rhythm.     Heart sounds: No murmur.  Pulmonary:     Effort: Pulmonary effort is normal.     Breath sounds: Normal breath sounds.  Abdominal:     General: Bowel sounds are normal.     Palpations: Abdomen is soft.     Tenderness: There is no abdominal tenderness.  Skin:    General: Skin is warm and dry.  Neurological:     Mental Status: She is alert and oriented to person, place, and time.     BP (!) 122/56 (BP Location: Left Arm, Patient Position: Sitting, Cuff Size: Normal)   Pulse (!) 58   Temp 97.8 F (36.6 C) (Oral)   Resp 18   Wt 177 lb (80.3 kg)   SpO2 98%   BMI 28.57 kg/m  Wt Readings from Last 3 Encounters:  05/13/18 177 lb (80.3 kg)  04/23/18 178 lb (80.7 kg)  03/28/18 179 lb (81.2 kg)     Lab Results  Component Value Date   WBC 4.9 02/11/2018   HGB 12.3 02/11/2018   HCT 36.1 02/11/2018   PLT 370.0 02/11/2018   GLUCOSE 77 05/13/2018   CHOL 173 02/11/2018   TRIG 138.0 02/11/2018   HDL 41.10 02/11/2018   LDLDIRECT 163.0 07/24/2014   LDLCALC 104 (H) 02/11/2018   ALT 15 05/13/2018   AST 17 05/13/2018   NA 132 (L) 05/13/2018   K 3.8 05/13/2018   CL 95 (L) 05/13/2018   CREATININE 1.17 05/13/2018   BUN 15 05/13/2018   CO2 30 05/13/2018   TSH 1.09 02/11/2018    Lab Results  Component Value Date   TSH 1.09 02/11/2018   Lab Results  Component Value Date   WBC 4.9 02/11/2018   HGB 12.3 02/11/2018   HCT 36.1 02/11/2018   MCV  82.9 02/11/2018   PLT 370.0 02/11/2018   Lab Results  Component Value Date   NA 132 (L) 05/13/2018   K 3.8 05/13/2018   CO2 30 05/13/2018   GLUCOSE 77 05/13/2018   BUN 15 05/13/2018   CREATININE 1.17 05/13/2018   BILITOT 0.7 05/13/2018   ALKPHOS 81 05/13/2018   AST 17 05/13/2018   ALT 15 05/13/2018   PROT 6.7 05/13/2018   ALBUMIN 4.1 05/13/2018   CALCIUM 9.5 05/13/2018   GFR 45.43 (L) 05/13/2018   Lab Results  Component Value Date   CHOL 173 02/11/2018   Lab Results  Component Value Date   HDL 41.10 02/11/2018   Lab Results  Component Value Date   LDLCALC 104 (H) 02/11/2018   Lab Results  Component Value Date   TRIG 138.0 02/11/2018   Lab Results  Component Value Date   CHOLHDL 4 02/11/2018   No results found for: HGBA1C     Assessment & Plan:   Problem List Items Addressed This Visit    Hypertension - Primary    Well controlled, no changes to meds. Encouraged heart healthy diet such as the DASH diet and exercise as tolerated.       Relevant Orders   Comprehensive metabolic panel (Completed)   Osteopenia    Encouraged to get adequate exercise, calcium and vitamin d intake      Hyponatremia  Mild asymptomatic, minimize water intake and recheck with next visit.       Low vitamin D level    Supplement and monitor      Acute bilateral knee pain    Is following with orthopaedics Dr Ninfa Linden and has responded to some recent injections.          I have discontinued Mattalyn H. Sandstrom's Cholecalciferol (VITAMIN D PO). I am also having her maintain her Multiple Minerals-Vitamins (CITRACAL PLUS PO), trospium, calcium carbonate, cetirizine, cycloSPORINE, ranitidine, atorvastatin, tiZANidine, aspirin, amLODipine, omeprazole, metoprolol succinate, valsartan, and Vitamin D (Ergocalciferol).  No orders of the defined types were placed in this encounter.    Penni Homans, MD

## 2018-05-28 DIAGNOSIS — Z01419 Encounter for gynecological examination (general) (routine) without abnormal findings: Secondary | ICD-10-CM | POA: Diagnosis not present

## 2018-05-28 DIAGNOSIS — Z1231 Encounter for screening mammogram for malignant neoplasm of breast: Secondary | ICD-10-CM | POA: Diagnosis not present

## 2018-05-28 DIAGNOSIS — N3281 Overactive bladder: Secondary | ICD-10-CM | POA: Diagnosis not present

## 2018-05-28 DIAGNOSIS — Z6830 Body mass index (BMI) 30.0-30.9, adult: Secondary | ICD-10-CM | POA: Diagnosis not present

## 2018-06-04 NOTE — Telephone Encounter (Signed)
Called and spoke with pt stating to her the results of her HST and due to her having mild OSA, we could either do oral appliance or cpap. Pt stated that she has an appt scheduled with her regular dentist and if the appt was able to still happen, she wants to discuss this with her dentist if able and stated she would call our office back later. Nothing further needed.

## 2018-06-04 NOTE — Telephone Encounter (Signed)
Pt is returning call. Cb is [phone].

## 2018-06-04 NOTE — Telephone Encounter (Signed)
Unable to reach LMTCB 

## 2018-06-28 ENCOUNTER — Emergency Department (HOSPITAL_BASED_OUTPATIENT_CLINIC_OR_DEPARTMENT_OTHER)
Admission: EM | Admit: 2018-06-28 | Discharge: 2018-06-28 | Disposition: A | Discharge status: Home / Self Care | Payer: PPO | Attending: Emergency Medicine | Admitting: Emergency Medicine

## 2018-06-28 ENCOUNTER — Emergency Department (HOSPITAL_BASED_OUTPATIENT_CLINIC_OR_DEPARTMENT_OTHER): Payer: PPO

## 2018-06-28 ENCOUNTER — Encounter (HOSPITAL_BASED_OUTPATIENT_CLINIC_OR_DEPARTMENT_OTHER): Payer: Self-pay | Admitting: Emergency Medicine

## 2018-06-28 ENCOUNTER — Other Ambulatory Visit: Payer: Self-pay

## 2018-06-28 ENCOUNTER — Telehealth: Payer: Self-pay

## 2018-06-28 DIAGNOSIS — R600 Localized edema: Secondary | ICD-10-CM | POA: Insufficient documentation

## 2018-06-28 DIAGNOSIS — Z79899 Other long term (current) drug therapy: Secondary | ICD-10-CM | POA: Insufficient documentation

## 2018-06-28 DIAGNOSIS — Y92410 Unspecified street and highway as the place of occurrence of the external cause: Secondary | ICD-10-CM | POA: Diagnosis not present

## 2018-06-28 DIAGNOSIS — I10 Essential (primary) hypertension: Secondary | ICD-10-CM | POA: Insufficient documentation

## 2018-06-28 DIAGNOSIS — S8011XA Contusion of right lower leg, initial encounter: Secondary | ICD-10-CM | POA: Diagnosis not present

## 2018-06-28 DIAGNOSIS — S8991XA Unspecified injury of right lower leg, initial encounter: Secondary | ICD-10-CM | POA: Diagnosis not present

## 2018-06-28 DIAGNOSIS — S40011A Contusion of right shoulder, initial encounter: Secondary | ICD-10-CM

## 2018-06-28 DIAGNOSIS — M25561 Pain in right knee: Secondary | ICD-10-CM | POA: Diagnosis not present

## 2018-06-28 DIAGNOSIS — Y999 Unspecified external cause status: Secondary | ICD-10-CM | POA: Insufficient documentation

## 2018-06-28 DIAGNOSIS — M7989 Other specified soft tissue disorders: Secondary | ICD-10-CM | POA: Diagnosis not present

## 2018-06-28 DIAGNOSIS — Z7982 Long term (current) use of aspirin: Secondary | ICD-10-CM | POA: Insufficient documentation

## 2018-06-28 DIAGNOSIS — Y9389 Activity, other specified: Secondary | ICD-10-CM | POA: Insufficient documentation

## 2018-06-28 DIAGNOSIS — M79604 Pain in right leg: Secondary | ICD-10-CM | POA: Diagnosis not present

## 2018-06-28 DIAGNOSIS — M19011 Primary osteoarthritis, right shoulder: Secondary | ICD-10-CM | POA: Diagnosis not present

## 2018-06-28 MED ORDER — CEPHALEXIN 500 MG PO CAPS: 500.0000 mg | ORAL_CAPSULE | Freq: Four times a day (QID) | ORAL | 0 refills | Status: DC
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
## 2018-06-28 NOTE — ED Notes (Signed)
Patient transported to X-ray 

## 2018-06-28 NOTE — Telephone Encounter (Signed)
It is normal to have blood pooling in her feet as gravity sets in but I cannot sure she does not have a blood clot that needs to be addressed urgently. If she does not go to ER can do a virtual visit at 12:20 or this afternoon at 2 to evaluate and order some imaging,earlier would be best to get the imaging done today if at all possible.
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
## 2018-06-28 NOTE — Discharge Instructions (Signed)
Keflex as prescribed  Ibuprofen 600 mg every 6 hours as needed for pain.  Follow-up with your primary doctor if not improving in the next week.

## 2018-06-28 NOTE — Telephone Encounter (Signed)
She was seen in the ED

## 2018-07-19 ENCOUNTER — Other Ambulatory Visit: Payer: Self-pay | Admitting: Family Medicine

## 2018-08-07 ENCOUNTER — Other Ambulatory Visit: Payer: Self-pay | Admitting: Family Medicine

## 2018-10-16 ENCOUNTER — Other Ambulatory Visit: Payer: Self-pay | Admitting: Family Medicine

## 2018-10-22 ENCOUNTER — Encounter: Payer: PPO | Admitting: Family Medicine

## 2018-10-22 ENCOUNTER — Telehealth: Payer: Self-pay | Admitting: Neurology

## 2018-10-22 NOTE — Telephone Encounter (Signed)
Pt states that her insurance will not cover her mouth guard and she would like to be advised on what she can do so that it will be covered or if she can get an EOB etc. Please advise.

## 2018-10-23 NOTE — Telephone Encounter (Signed)
It does not look like we completed pt's sleep study. If pt is considering a mouth guard for her sleep related concerns, she needs to speak with her sleep physician.  I called pt to discuss. No answer, left a message asking her to call me back.

## 2018-10-24 ENCOUNTER — Telehealth: Payer: Self-pay | Admitting: Pulmonary Disease

## 2018-10-24 NOTE — Telephone Encounter (Signed)
I called pt. It appears that [facility] pulmonology did pt's HST. She should call their office to discuss the oral appliance and further documentation regarding her sleep. I gave pt their phone number. Pt verbalized understanding of recommendations.

## 2018-10-24 NOTE — Telephone Encounter (Signed)
Call returned to patient, she reports she already paid her dentist for her oral appliance but her insurance told her if she can get a letter of medical necessity for her cpap they will re-imburse her. I made her aware RA will return to clinic 08/26, she reports it can wait until then.   RA please advise if willing to write letter, If so what would you like letter to say? Pt aware you return to clinic 08/26.

## 2018-10-25 ENCOUNTER — Encounter: Payer: Self-pay | Admitting: *Deleted

## 2018-10-25 NOTE — Telephone Encounter (Signed)
Letter was created for patient stating Dr. Jana Half. Letter placed up front in file folder under O  ATC patient to let her know it was here for pick up. Per DPR am able to leave detailed message on VM, phone kept ringing then hung up. No VM option will keep open

## 2018-10-25 NOTE — Telephone Encounter (Signed)
Okay to give her letter stating that-she was evaluated in our office for excessive daytime somnolence, fatigue and snoring. Home sleep study showed mild OSA with 11 events per hour. Oral appliance was considered as appropriate treatment for obstructive sleep apnea and medically necessary Please reach out to [place] for further questions

## 2018-10-28 NOTE — Telephone Encounter (Signed)
ATC Patient.  No answer and no VM option.

## 2018-10-29 NOTE — Telephone Encounter (Signed)
lmtcb for pt.  

## 2018-10-30 NOTE — Telephone Encounter (Signed)
Called spoke with patient who reported she is aware that letter is ready for pick and will come by the office at her earliest convenience.  Nothing further needed at this time; will sign off.

## 2018-10-31 ENCOUNTER — Telehealth: Payer: Self-pay | Admitting: Family Medicine

## 2018-10-31 NOTE — Telephone Encounter (Signed)
Called patient to schedule AWV, no answer. Will try to call patient again at a later time. SF °

## 2018-11-09 ENCOUNTER — Other Ambulatory Visit: Payer: Self-pay | Admitting: Family Medicine

## 2018-12-20 ENCOUNTER — Telehealth: Payer: Self-pay

## 2018-12-20 NOTE — Telephone Encounter (Signed)
Copied from [place] [id number]. Topic: General - Other >> Dec 20, 2018  9:27 AM Rainey Pines A wrote: Patient returned call to go over screening for appt

## 2018-12-23 ENCOUNTER — Other Ambulatory Visit: Payer: Self-pay

## 2018-12-24 ENCOUNTER — Ambulatory Visit (INDEPENDENT_AMBULATORY_CARE_PROVIDER_SITE_OTHER): Payer: PPO | Admitting: Family Medicine

## 2018-12-24 VITALS — BP 122/58 | HR 65 | Temp 98.8°F | Resp 18 | Wt 172.2 lb

## 2018-12-24 DIAGNOSIS — E782 Mixed hyperlipidemia: Secondary | ICD-10-CM

## 2018-12-24 DIAGNOSIS — G3184 Mild cognitive impairment, so stated: Secondary | ICD-10-CM | POA: Insufficient documentation

## 2018-12-24 DIAGNOSIS — M25512 Pain in left shoulder: Secondary | ICD-10-CM | POA: Diagnosis not present

## 2018-12-24 DIAGNOSIS — K219 Gastro-esophageal reflux disease without esophagitis: Secondary | ICD-10-CM | POA: Diagnosis not present

## 2018-12-24 DIAGNOSIS — I10 Essential (primary) hypertension: Secondary | ICD-10-CM

## 2018-12-24 DIAGNOSIS — Z0000 Encounter for general adult medical examination without abnormal findings: Secondary | ICD-10-CM | POA: Diagnosis not present

## 2018-12-24 DIAGNOSIS — G8929 Other chronic pain: Secondary | ICD-10-CM

## 2018-12-24 DIAGNOSIS — E871 Hypo-osmolality and hyponatremia: Secondary | ICD-10-CM | POA: Diagnosis not present

## 2018-12-24 DIAGNOSIS — M25511 Pain in right shoulder: Secondary | ICD-10-CM | POA: Diagnosis not present

## 2018-12-24 DIAGNOSIS — R7989 Other specified abnormal findings of blood chemistry: Secondary | ICD-10-CM

## 2018-12-24 LAB — COMPREHENSIVE METABOLIC PANEL
ALT: 13 U/L (ref 0–35)
AST: 16 U/L (ref 0–37)
Albumin: 4.3 g/dL (ref 3.5–5.2)
Alkaline Phosphatase: 71 U/L (ref 39–117)
BUN: 15 mg/dL (ref 6–23)
CO2: 29 mEq/L (ref 19–32)
Calcium: 9.6 mg/dL (ref 8.4–10.5)
Chloride: 94 mEq/L — ABNORMAL LOW (ref 96–112)
Creatinine, Ser: 1.16 mg/dL (ref 0.40–1.20)
GFR: 45.8 mL/min — ABNORMAL LOW (ref >60.00)
Glucose, Bld: 96 mg/dL (ref 70–99)
Potassium: 3.9 mEq/L (ref 3.5–5.1)
Sodium: 131 mEq/L — ABNORMAL LOW (ref 135–145)
Total Bilirubin: 0.7 mg/dL (ref 0.2–1.2)
Total Protein: 7 g/dL (ref 6.0–8.3)

## 2018-12-24 LAB — CBC
HCT: 34.3 % — ABNORMAL LOW (ref 36.0–46.0)
Hemoglobin: 11.8 g/dL — ABNORMAL LOW (ref 12.0–15.0)
MCHC: 34.5 g/dL (ref 30.0–36.0)
MCV: 89.5 fl (ref 78.0–100.0)
Platelets: 339 10*3/uL (ref 150.0–400.0)
RBC: 3.84 Mil/uL — ABNORMAL LOW (ref 3.87–5.11)
RDW: 12.9 % (ref 11.5–15.5)
WBC: 5.5 10*3/uL (ref 4.0–10.5)

## 2018-12-24 LAB — LIPID PANEL
Cholesterol: 194 mg/dL (ref 0–200)
HDL: 40.5 mg/dL (ref >39.00)
LDL Cholesterol: 123 mg/dL — ABNORMAL HIGH (ref 0–99)
NonHDL: 153.05
Total CHOL/HDL Ratio: 5
Triglycerides: 150 mg/dL — ABNORMAL HIGH (ref 0.0–149.0)
VLDL: 30 mg/dL (ref 0.0–40.0)

## 2018-12-24 LAB — TSH: TSH: 0.94 u[IU]/mL (ref 0.35–4.50)

## 2018-12-24 LAB — VITAMIN D 25 HYDROXY (VIT D DEFICIENCY, FRACTURES): VITD: 73.46 ng/mL (ref 30.00–100.00)

## 2018-12-24 NOTE — Assessment & Plan Note (Signed)
Well controlled, no changes to meds. Encouraged heart healthy diet such as the DASH diet and exercise as tolerated.  °

## 2018-12-24 NOTE — Assessment & Plan Note (Addendum)
Encouraged heart healthy diet, increase exercise, avoid trans fats, consider a krill oil cap daily. Tolerating Atorvastatin 

## 2018-12-24 NOTE — Assessment & Plan Note (Signed)
Right shoulder pain is tolerable but still present, does not limit activity or ROM. She is encouraged to apply topical treatments with menthol bid and stay active if worsens let us know for consideration of referral to sports med or ortho.

## 2018-12-24 NOTE — Assessment & Plan Note (Signed)
Doing well avoid offending foods and notify us if worsens.

## 2018-12-24 NOTE — Assessment & Plan Note (Signed)
Supplement and monitor 

## 2018-12-24 NOTE — Progress Notes (Signed)
Patient ID: Erika [name], female   DOB: 1945/05/11, 73 y.o.   MRN: [id number]   Subjective:    Patient ID: Erika [name], female    DOB: 05/26/1945, 73 y.o.   MRN: [id number]  No chief complaint on file.   HPI Patient is in today for annual preventative exam and follow up on chronic medical concerns including hypertension, hyperlipidemia, reflux and more. She is under tremendous stress secondary to helping her grand children with home schooling and helping her husband as he manages his diagnosis of Prostate cancer. She notes anxiety and worry and is concerned about some trouble with word finding and forgetfulness. No recent febrile illness or hospitalizations. She is mostly staying in and is trying to eat well and exercise. No acute concerns otherwise. Denies CP/palp/SOB/HA/congestion/fevers/GI or GU c/o. Taking meds as prescribed  Past Medical History:  Diagnosis Date  . Allergic state 06/24/2012  . Anterior cervical lymphadenopathy 02/08/2013  . Arthritis 04/02/2011  . Bronchitis 04/02/2011  . Chicken pox as a child  . Diverticulosis of colon    colonoscopy 04/14/1999  . GERD (gastroesophageal reflux disease)   . History of fibrocystic disease of breast   . Hyperlipidemia, mixed 02/20/2007   Followed as Primary Care Patient/ [facility]/ [facility]  Goal LDL < 130 HBP/Pos fm hx      . Hypertension    on 4 medications for HTN  . Hypokalemia 06/24/2012  . Hyponatremia 08/02/2014  . IBS (irritable bowel syndrome) 04/02/2011  . Measles as a child  . Mumps as a child  . Obesity   . Osteopenia   . Overactive bladder 04/02/2011  . Overweight   . Preventative health care 03/13/2012  . Shoulder pain, bilateral 06/14/2011    Past Surgical History:  Procedure Laterality Date  . BREAST REDUCTION SURGERY  1986  . BUNIONECTOMY Bilateral 1980  . CATARACT EXTRACTION, BILATERAL    . HAMMER TOE SURGERY Right 12-03-13   foot  . MOUTH SURGERY  01-10-13   negative  . TONSILLECTOMY      Family  History  Problem Relation Age of Onset  . Stroke Mother 53  . Hyperlipidemia Mother   . Hypertension Mother   . Heart attack Father 41  . Hypertension Father   . Heart disease Father   . Hypertension Brother   . Aneurysm Brother        aortic  . Hyperlipidemia Brother   . Heart disease Brother        aortic aneurysm rupture  . Multiple sclerosis Daughter   . Alcohol abuse Son   . Schizophrenia Son   . Bipolar disorder Son   . Mental illness Son        schizophrenic, bipolar    Social History   Socioeconomic History  . Marital status: Married    Spouse name: Not on file  . Number of children: 4  . Years of education: Not on file  . Highest education level: Not on file  Occupational History  . Occupation: dental hygenist  Social Needs  . Financial resource strain: Not on file  . Food insecurity    Worry: Not on file    Inability: Not on file  . Transportation needs    Medical: Not on file    Non-medical: Not on file  Tobacco Use  . Smoking status: Never Smoker  . Smokeless tobacco: Never Used  Substance and Sexual Activity  . Alcohol use: No  . Drug use: No  . Sexual activity:  Not Currently  Lifestyle  . Physical activity    Days per week: Not on file    Minutes per session: Not on file  . Stress: Not on file  Relationships  . Social Herbalist on phone: Not on file    Gets together: Not on file    Attends religious service: Not on file    Active member of club or organization: Not on file    Attends meetings of clubs or organizations: Not on file    Relationship status: Not on file  . Intimate partner violence    Fear of current or ex partner: Not on file    Emotionally abused: Not on file    Physically abused: Not on file    Forced sexual activity: Not on file  Other Topics Concern  . Not on file  Social History Narrative   4 children 2 are adopted    Outpatient Medications Prior to Visit  Medication Sig Dispense Refill  . amLODipine  (NORVASC) 10 MG tablet TAKE 1/2 TABLET BY MOUTH TWICE A DAY 90 tablet 1  . aspirin 325 MG EC tablet Take 325 mg by mouth daily.    Marland Kitchen atorvastatin (LIPITOR) 10 MG tablet Take 1 tablet (10 mg total) by mouth daily at 6 PM. 90 tablet 2  . calcium carbonate (TUMS) 500 MG chewable tablet Chew 1 tablet by mouth as needed.     . cetirizine (ZYRTEC) 10 MG tablet Take 10 mg by mouth at bedtime as needed.      . cycloSPORINE (RESTASIS) 0.05 % ophthalmic emulsion Place 1 drop into both eyes 2 (two) times daily.    . metoprolol succinate (TOPROL-XL) 50 MG 24 hr tablet TAKE 1 TABLET BY MOUTH TWICE A DAY WITH OR IMMEDIATELY FOLLOWING A MEAL 270 tablet 1  . Multiple Minerals-Vitamins (CITRACAL PLUS PO) Take 1 tablet by mouth daily.      Marland Kitchen omeprazole (PRILOSEC) 20 MG capsule TAKE ONE CAPSULE BY MOUTH EVERY DAY 90 capsule 2  . triamterene-hydrochlorothiazide (MAXZIDE-25) 37.5-25 MG tablet TAKE 1 TABLET BY MOUTH EVERY DAY 90 tablet 1  . trospium (SANCTURA) 20 MG tablet Take 20 mg by mouth daily.      . valsartan (DIOVAN) 320 MG tablet TAKE 1 TABLET BY MOUTH EVERY DAY 90 tablet 1  . cephALEXin (KEFLEX) 500 MG capsule Take 1 capsule (500 mg total) by mouth 4 (four) times daily. 28 capsule 0  . ranitidine (ZANTAC) 300 MG tablet TAKE 1 TABLET (300 MG TOTAL) BY MOUTH AT BEDTIME. 30 tablet 2  . tiZANidine (ZANAFLEX) 2 MG tablet Take 1-2 tablets (2-4 mg total) by mouth at bedtime as needed for muscle spasms. 30 tablet 0  . Vitamin D, Ergocalciferol, (DRISDOL) 1.25 MG (50000 UT) CAPS capsule TAKE 1 CAPSULE (50,000 UNITS TOTAL) BY MOUTH EVERY 7 (SEVEN) DAYS. 12 capsule 1   No facility-administered medications prior to visit.     No Known Allergies  Review of Systems  Constitutional: Positive for malaise/fatigue. Negative for chills and fever.  HENT: Negative for congestion and hearing loss.   Eyes: Negative for discharge.  Respiratory: Negative for cough, sputum production and shortness of breath.   Cardiovascular:  Negative for chest pain, palpitations and leg swelling.  Gastrointestinal: Negative for abdominal pain, blood in stool, constipation, diarrhea, heartburn, nausea and vomiting.  Genitourinary: Negative for dysuria, frequency, hematuria and urgency.  Musculoskeletal: Positive for joint pain. Negative for back pain, falls and myalgias.  Skin: Negative for rash.  Neurological: Negative for dizziness, sensory change, loss of consciousness, weakness and headaches.  Endo/Heme/Allergies: Negative for environmental allergies. Does not bruise/bleed easily.  Psychiatric/Behavioral: Positive for memory loss. Negative for depression and suicidal ideas. The patient is nervous/anxious. The patient does not have insomnia.        Objective:    Physical Exam Constitutional:      General: She is not in acute distress.    Appearance: She is well-developed.  HENT:     Head: Normocephalic and atraumatic.  Eyes:     Conjunctiva/sclera: Conjunctivae normal.  Neck:     Musculoskeletal: Neck supple.     Thyroid: No thyromegaly.  Cardiovascular:     Rate and Rhythm: Normal rate and regular rhythm.     Heart sounds: Normal heart sounds. No murmur.  Pulmonary:     Effort: Pulmonary effort is normal. No respiratory distress.     Breath sounds: Normal breath sounds.  Abdominal:     General: Bowel sounds are normal. There is no distension.     Palpations: Abdomen is soft. There is no mass.     Tenderness: There is no abdominal tenderness.  Lymphadenopathy:     Cervical: No cervical adenopathy.  Skin:    General: Skin is warm and dry.  Neurological:     Mental Status: She is alert and oriented to person, place, and time.  Psychiatric:        Behavior: Behavior normal.     BP (!) 122/58 (BP Location: Left Arm, Patient Position: Sitting, Cuff Size: Normal)   Pulse 65   Temp 98.8 F (37.1 C) (Temporal)   Resp 18   Wt 172 lb 3.2 oz (78.1 kg)   SpO2 98%   BMI 27.79 kg/m  Wt Readings from Last 3  Encounters:  12/24/18 172 lb 3.2 oz (78.1 kg)  06/28/18 170 lb (77.1 kg)  05/13/18 177 lb (80.3 kg)    Diabetic Foot Exam - Simple   No data filed     Lab Results  Component Value Date   WBC 4.9 02/11/2018   HGB 12.3 02/11/2018   HCT 36.1 02/11/2018   PLT 370.0 02/11/2018   GLUCOSE 77 05/13/2018   CHOL 173 02/11/2018   TRIG 138.0 02/11/2018   HDL 41.10 02/11/2018   LDLDIRECT 163.0 07/24/2014   LDLCALC 104 (H) 02/11/2018   ALT 15 05/13/2018   AST 17 05/13/2018   NA 132 (L) 05/13/2018   K 3.8 05/13/2018   CL 95 (L) 05/13/2018   CREATININE 1.17 05/13/2018   BUN 15 05/13/2018   CO2 30 05/13/2018   TSH 1.09 02/11/2018    Lab Results  Component Value Date   TSH 1.09 02/11/2018   Lab Results  Component Value Date   WBC 4.9 02/11/2018   HGB 12.3 02/11/2018   HCT 36.1 02/11/2018   MCV 82.9 02/11/2018   PLT 370.0 02/11/2018   Lab Results  Component Value Date   NA 132 (L) 05/13/2018   K 3.8 05/13/2018   CO2 30 05/13/2018   GLUCOSE 77 05/13/2018   BUN 15 05/13/2018   CREATININE 1.17 05/13/2018   BILITOT 0.7 05/13/2018   ALKPHOS 81 05/13/2018   AST 17 05/13/2018   ALT 15 05/13/2018   PROT 6.7 05/13/2018   ALBUMIN 4.1 05/13/2018   CALCIUM 9.5 05/13/2018   GFR 45.43 (L) 05/13/2018   Lab Results  Component Value Date   CHOL 173 02/11/2018   Lab Results  Component Value Date   HDL 41.10  02/11/2018   Lab Results  Component Value Date   LDLCALC 104 (H) 02/11/2018   Lab Results  Component Value Date   TRIG 138.0 02/11/2018   Lab Results  Component Value Date   CHOLHDL 4 02/11/2018   No results found for: HGBA1C     Assessment & Plan:   Problem List Items Addressed This Visit    Hyperlipidemia, mixed    Encouraged heart healthy diet, increase exercise, avoid trans fats, consider a krill oil cap daily. Tolerating Atorvastatin      Relevant Orders   Lipid panel   GERD    Doing well avoid offending foods and notify us if worsens.        Hypertension - Primary    Well controlled, no changes to meds. Encouraged heart healthy diet such as the DASH diet and exercise as tolerated.       Relevant Orders   CBC   Comprehensive metabolic panel   TSH   Shoulder pain, bilateral    Right shoulder pain is tolerable but still present, does not limit activity or ROM. She is encouraged to apply topical treatments with menthol bid and stay active if worsens let us know for consideration of referral to sports med or ortho.       Preventative health care    Patient encouraged to maintain heart healthy diet, regular exercise, adequate sleep. Consider daily probiotics. Take medications as prescribed. Labs ordered and reviewed. Had her 2 shingrix shots and documentation received. She has had her flu shot also but we did not receive documentation will have her sign a release of records to get information into her medical chart.       Hyponatremia   Relevant Orders   Comprehensive metabolic panel   Low vitamin D level    Supplement and monitor      Relevant Orders   VITAMIN D 25 Hydroxy (Vit-D Deficiency, Fractures)   Mild cognitive impairment    She notes increasing trouble with word finding and forgetfulness. Also acknowledges great stress helping to home school her grand kids and her husband has recently been diagnosed with prostate cancer and begun treatment. She is encouraged to exercise, eat a healthy diet, get adequate sleep, try Prevagen and learn something new. She is offered referral to neurology for a work up but declines for now will let us know if she changes her mind.         I have discontinued Tearia H. Schlesinger's ranitidine, tiZANidine, Vitamin D (Ergocalciferol), and cephALEXin. I am also having her maintain her Multiple Minerals-Vitamins (CITRACAL PLUS PO), trospium, calcium carbonate, cetirizine, cycloSPORINE, atorvastatin, aspirin, metoprolol succinate, amLODipine, valsartan, triamterene-hydrochlorothiazide, and  omeprazole.  No orders of the defined types were placed in this encounter.    Penni Homans, MD

## 2018-12-24 NOTE — Patient Instructions (Addendum)
Pulse oximeter want oxygen in the 90s.  Multivitamin with minerals selenium, zinc, vitamin c and d  Call me for referral if your memory concerns get worse and we will refer.   If your shoulder worsens call for referral to sports medicine for consideration  Pepcid/Famotidine 10-20 mg as needed for heartburn works quickly when needed and can be used with Tums   Preventive Care 65 Years and Older, Female Preventive care refers to lifestyle choices and visits with your health care provider that can promote health and wellness. This includes:  A yearly physical exam. This is also called an annual well check.  Regular dental and eye exams.  Immunizations.  Screening for certain conditions.  Healthy lifestyle choices, such as diet and exercise. What can I expect for my preventive care visit? Physical exam Your health care provider will check:  Height and weight. These may be used to calculate body mass index (BMI), which is a measurement that tells if you are at a healthy weight.  Heart rate and blood pressure.  Your skin for abnormal spots. Counseling Your health care provider may ask you questions about:  Alcohol, tobacco, and drug use.  Emotional well-being.  Home and relationship well-being.  Sexual activity.  Eating habits.  History of falls.  Memory and ability to understand (cognition).  Work and work Statistician.  Pregnancy and menstrual history. What immunizations do I need?  Influenza (flu) vaccine  This is recommended every year. Tetanus, diphtheria, and pertussis (Tdap) vaccine  You may need a Td booster every 10 years. Varicella (chickenpox) vaccine  You may need this vaccine if you have not already been vaccinated. Zoster (shingles) vaccine  You may need this after age 27. Pneumococcal conjugate (PCV13) vaccine  One dose is recommended after age 29. Pneumococcal polysaccharide (PPSV23) vaccine  One dose is recommended after age  40. Measles, mumps, and rubella (MMR) vaccine  You may need at least one dose of MMR if you were born in 1957 or later. You may also need a second dose. Meningococcal conjugate (MenACWY) vaccine  You may need this if you have certain conditions. Hepatitis A vaccine  You may need this if you have certain conditions or if you travel or work in places where you may be exposed to hepatitis A. Hepatitis B vaccine  You may need this if you have certain conditions or if you travel or work in places where you may be exposed to hepatitis B. Haemophilus influenzae type b (Hib) vaccine  You may need this if you have certain conditions. You may receive vaccines as individual doses or as more than one vaccine together in one shot (combination vaccines). Talk with your health care provider about the risks and benefits of combination vaccines. What tests do I need? Blood tests  Lipid and cholesterol levels. These may be checked every 5 years, or more frequently depending on your overall health.  Hepatitis C test.  Hepatitis B test. Screening  Lung cancer screening. You may have this screening every year starting at age 8 if you have a 30-pack-year history of smoking and currently smoke or have quit within the past 15 years.  Colorectal cancer screening. All adults should have this screening starting at age 80 and continuing until age 32. Your health care provider may recommend screening at age 25 if you are at increased risk. You will have tests every 1-10 years, depending on your results and the type of screening test.  Diabetes screening. This is done by  checking your blood sugar (glucose) after you have not eaten for a while (fasting). You may have this done every 1-3 years.  Mammogram. This may be done every 1-2 years. Talk with your health care provider about how often you should have regular mammograms.  BRCA-related cancer screening. This may be done if you have a family history of  breast, ovarian, tubal, or peritoneal cancers. Other tests  Sexually transmitted disease (STD) testing.  Bone density scan. This is done to screen for osteoporosis. You may have this done starting at age 35. Follow these instructions at home: Eating and drinking  Eat a diet that includes fresh fruits and vegetables, whole grains, lean protein, and low-fat dairy products. Limit your intake of foods with high amounts of sugar, saturated fats, and salt.  Take vitamin and mineral supplements as recommended by your health care provider.  Do not drink alcohol if your health care provider tells you not to drink.  If you drink alcohol: ? Limit how much you have to 0-1 drink a day. ? Be aware of how much alcohol is in your drink. In the [place] one drink equals one 12 oz bottle of beer (355 mL), one 5 oz glass of wine (148 mL), or one 1 oz glass of hard liquor (44 mL). Lifestyle  Take daily care of your teeth and gums.  Stay active. Exercise for at least 30 minutes on 5 or more days each week.  Do not use any products that contain nicotine or tobacco, such as cigarettes, e-cigarettes, and chewing tobacco. If you need help quitting, ask your health care provider.  If you are sexually active, practice safe sex. Use a condom or other form of protection in order to prevent STIs (sexually transmitted infections).  Talk with your health care provider about taking a low-dose aspirin or statin. What's next?  Go to your health care provider once a year for a well check visit.  Ask your health care provider how often you should have your eyes and teeth checked.  Stay up to date on all vaccines. This information is not intended to replace advice given to you by your health care provider. Make sure you discuss any questions you have with your health care provider. Document Released: 03/19/2015 Document Revised: 02/14/2018 Document Reviewed: 02/14/2018 Elsevier Patient Education  2020 [employer].

## 2018-12-24 NOTE — Assessment & Plan Note (Addendum)
Patient encouraged to maintain heart healthy diet, regular exercise, adequate sleep. Consider daily probiotics. Take medications as prescribed. Labs ordered and reviewed. Had her 2 shingrix shots and documentation received. She has had her flu shot also but we did not receive documentation will have her sign a release of records to get information into her medical chart.

## 2018-12-24 NOTE — Assessment & Plan Note (Signed)
She notes increasing trouble with word finding and forgetfulness. Also acknowledges great stress helping to home school her grand kids and her husband has recently been diagnosed with prostate cancer and begun treatment. She is encouraged to exercise, eat a healthy diet, get adequate sleep, try Prevagen and learn something new. She is offered referral to neurology for a work up but declines for now will let us know if she changes her mind.

## 2019-01-29 ENCOUNTER — Other Ambulatory Visit: Payer: Self-pay

## 2019-02-20 ENCOUNTER — Other Ambulatory Visit: Payer: Self-pay | Admitting: Family Medicine

## 2019-02-22 ENCOUNTER — Other Ambulatory Visit: Payer: Self-pay | Admitting: Family Medicine

## 2019-04-08 ENCOUNTER — Telehealth: Payer: Self-pay

## 2019-04-08 NOTE — Telephone Encounter (Signed)
Patient called stating she would like to switch devices and would like the hose sleep machine. Patient states she has spoken to her insurance and they have okayed the machine but would need OV notes sent over.  Please follow up.

## 2019-04-08 NOTE — Telephone Encounter (Signed)
I reached out to the pt. She sts she is interesting in stopping the dental device and starting the home cpap machine.  Pt was advised to call [facility] since the Numa back in March was previously completed by them.  Pt verbalized understanding. I provided the # for  Pulmonology to the pt.

## 2019-04-09 ENCOUNTER — Telehealth: Payer: Self-pay | Admitting: Pulmonary Disease

## 2019-04-09 NOTE — Telephone Encounter (Signed)
Called and spoke with pt. Stated to her before we could do anything with cpap supplies, she will need to schedule a f/u appt. Pt stated she had been using cpap and then switched to oral device and had issues with that and now is wanting to use cpap again and needs supplies. When I asked pt if I could make her an appt, she said that [facility] was not her PCP and I stated to her that he was the sleep MD she saw. Pt stated she wanted to check with PCP first to see if they could help her out with supplies for cpap and stated if she needed to, she would contact our office back to schedule an appt. Nothing further needed.

## 2019-04-12 ENCOUNTER — Other Ambulatory Visit: Payer: Self-pay | Admitting: Family Medicine

## 2019-04-28 ENCOUNTER — Other Ambulatory Visit: Payer: Self-pay

## 2019-04-28 ENCOUNTER — Ambulatory Visit (INDEPENDENT_AMBULATORY_CARE_PROVIDER_SITE_OTHER): Payer: PPO | Admitting: Family Medicine

## 2019-04-28 DIAGNOSIS — G4730 Sleep apnea, unspecified: Secondary | ICD-10-CM

## 2019-04-28 DIAGNOSIS — M8580 Other specified disorders of bone density and structure, unspecified site: Secondary | ICD-10-CM

## 2019-04-28 DIAGNOSIS — E782 Mixed hyperlipidemia: Secondary | ICD-10-CM | POA: Diagnosis not present

## 2019-04-28 DIAGNOSIS — R7989 Other specified abnormal findings of blood chemistry: Secondary | ICD-10-CM | POA: Diagnosis not present

## 2019-04-28 DIAGNOSIS — G3184 Mild cognitive impairment, so stated: Secondary | ICD-10-CM

## 2019-04-28 DIAGNOSIS — K219 Gastro-esophageal reflux disease without esophagitis: Secondary | ICD-10-CM | POA: Diagnosis not present

## 2019-04-28 DIAGNOSIS — I10 Essential (primary) hypertension: Secondary | ICD-10-CM | POA: Diagnosis not present

## 2019-04-28 MED ORDER — ASPIRIN EC 81 MG PO TBEC: 81.0000 mg | DELAYED_RELEASE_TABLET | Freq: Every day | ORAL | Status: AC

## 2019-04-28 MED ORDER — FAMOTIDINE 40 MG PO TABS: 40.0000 mg | ORAL_TABLET | Freq: Every evening | ORAL | 1 refills | Status: DC | PRN

## 2019-04-28 NOTE — Assessment & Plan Note (Signed)
Supplement and monitor 

## 2019-04-28 NOTE — Assessment & Plan Note (Signed)
Had a sleep study at home through pulmonology a couple of years ago but then decided to proceed with dental appliance but she did not tolerate that. Will contact pulmonology to see if we can order or they have to see her.

## 2019-04-28 NOTE — Assessment & Plan Note (Signed)
Well controlled, no changes to meds. Encouraged heart healthy diet such as the DASH diet and exercise as tolerated.  °

## 2019-04-28 NOTE — Assessment & Plan Note (Signed)
Encouraged heart healthy diet, increase exercise, avoid trans fats, consider a krill oil cap daily 

## 2019-04-28 NOTE — Progress Notes (Signed)
Patient ID: Erika [name], female   DOB: [birth year], 74 y.o.   MRN: [id number] Virtual Visit via Video Note  I connected with Erika [name] on 04/28/19 at  3:00 PM EST by a video enabled telemedicine application and verified that I am speaking with the correct person using two identifiers.  Location: Patient: home Provider: office   I discussed the limitations of evaluation and management by telemedicine and the availability of in person appointments. The patient expressed understanding and agreed to proceed. Robin Ewing, CMA was able to get the patient set up on a visit, video   Subjective:    Patient ID: Erika [name], female    DOB: 04-02-1945, 74 y.o.   MRN: [id number]  Chief Complaint  Patient presents with  . Follow-up    medication question. CPAP problem    HPI Patient is in today for follow up on chronic medical concerns. She is feeling better today but she does have some questions. She is eating welll and heartburn is controlled on Omeprazole she is worried about side effects of the Omeprezole. No recent febrile illness or hospitalizations. Is trying to maintain quarantine well. Well controlled, no changes to meds. Encouraged heart healthy diet such as the DASH diet and exercise as tolerated. Denies CP/palp/SOB/HA/congestion/fevers/GI or GU c/o. Taking meds as prescribed  Past Medical History:  Diagnosis Date  . Allergic state 06/24/2012  . Anterior cervical lymphadenopathy 02/08/2013  . Arthritis 04/02/2011  . Bronchitis 04/02/2011  . Chicken pox as a child  . Diverticulosis of colon    colonoscopy 04/14/1999  . GERD (gastroesophageal reflux disease)   . History of fibrocystic disease of breast   . Hyperlipidemia, mixed 02/20/2007   Followed as Primary Care Patient/ [facility]/ [facility]  Goal LDL < 130 HBP/Pos fm hx      . Hypertension    on 4 medications for HTN  . Hypokalemia 06/24/2012  . Hyponatremia 08/02/2014  . IBS (irritable bowel syndrome) 04/02/2011  .  Measles as a child  . Mumps as a child  . Obesity   . Osteopenia   . Overactive bladder 04/02/2011  . Overweight   . Preventative health care 03/13/2012  . Shoulder pain, bilateral 06/14/2011    Past Surgical History:  Procedure Laterality Date  . BREAST REDUCTION SURGERY  1986  . BUNIONECTOMY Bilateral 1980  . CATARACT EXTRACTION, BILATERAL    . HAMMER TOE SURGERY Right 12-03-13   foot  . MOUTH SURGERY  01-10-13   negative  . TONSILLECTOMY      Family History  Problem Relation Age of Onset  . Stroke Mother 71  . Hyperlipidemia Mother   . Hypertension Mother   . Heart attack Father 31  . Hypertension Father   . Heart disease Father   . Hypertension Brother   . Aneurysm Brother        aortic  . Hyperlipidemia Brother   . Heart disease Brother        aortic aneurysm rupture  . Multiple sclerosis Daughter   . Alcohol abuse Son   . Schizophrenia Son   . Bipolar disorder Son   . Mental illness Son        schizophrenic, bipolar    Social History   Socioeconomic History  . Marital status: Married    Spouse name: Not on file  . Number of children: 4  . Years of education: Not on file  . Highest education level: Not on file  Occupational History  .  Occupation: dental hygenist  Tobacco Use  . Smoking status: Never Smoker  . Smokeless tobacco: Never Used  Substance and Sexual Activity  . Alcohol use: No  . Drug use: No  . Sexual activity: Not Currently  Other Topics Concern  . Not on file  Social History Narrative   4 children 2 are adopted   Social Determinants of Health   Financial Resource Strain:   . Difficulty of Paying Living Expenses: Not on file  Food Insecurity:   . Worried About Charity fundraiser in the Last Year: Not on file  . Ran Out of Food in the Last Year: Not on file  Transportation Needs:   . Lack of Transportation (Medical): Not on file  . Lack of Transportation (Non-Medical): Not on file  Physical Activity:   . Days of Exercise per  Week: Not on file  . Minutes of Exercise per Session: Not on file  Stress:   . Feeling of Stress : Not on file  Social Connections:   . Frequency of Communication with Friends and Family: Not on file  . Frequency of Social Gatherings with Friends and Family: Not on file  . Attends Religious Services: Not on file  . Active Member of Clubs or Organizations: Not on file  . Attends Archivist Meetings: Not on file  . Marital Status: Not on file  Intimate Partner Violence:   . Fear of Current or Ex-Partner: Not on file  . Emotionally Abused: Not on file  . Physically Abused: Not on file  . Sexually Abused: Not on file    Outpatient Medications Prior to Visit  Medication Sig Dispense Refill  . amLODipine (NORVASC) 10 MG tablet TAKE 1/2 TABLET BY MOUTH TWICE A DAY 90 tablet 1  . atorvastatin (LIPITOR) 10 MG tablet Take 1 tablet (10 mg total) by mouth daily at 6 PM. 90 tablet 2  . calcium carbonate (TUMS) 500 MG chewable tablet Chew 1 tablet by mouth as needed.     . cetirizine (ZYRTEC) 10 MG tablet Take 10 mg by mouth at bedtime as needed.      . cycloSPORINE (RESTASIS) 0.05 % ophthalmic emulsion Place 1 drop into both eyes 2 (two) times daily.    . metoprolol succinate (TOPROL-XL) 50 MG 24 hr tablet TAKE 1 TABLET BY MOUTH TWICE A DAY WITH OR IMMEDIATELY FOLLOWING A MEAL 270 tablet 1  . Multiple Minerals-Vitamins (CITRACAL PLUS PO) Take 1 tablet by mouth daily.      Marland Kitchen omeprazole (PRILOSEC) 20 MG capsule TAKE ONE CAPSULE BY MOUTH EVERY DAY 90 capsule 2  . triamterene-hydrochlorothiazide (MAXZIDE-25) 37.5-25 MG tablet TAKE 1 TABLET BY MOUTH EVERY DAY 90 tablet 1  . trospium (SANCTURA) 20 MG tablet Take 20 mg by mouth daily.      . valsartan (DIOVAN) 320 MG tablet TAKE 1 TABLET BY MOUTH EVERY DAY 90 tablet 1  . aspirin 325 MG EC tablet Take 325 mg by mouth daily.     No facility-administered medications prior to visit.    No Known Allergies  ROS     Objective:    Physical  Exam  There were no vitals taken for this visit. Wt Readings from Last 3 Encounters:  12/24/18 172 lb 3.2 oz (78.1 kg)  06/28/18 170 lb (77.1 kg)  05/13/18 177 lb (80.3 kg)    Diabetic Foot Exam - Simple   No data filed     Lab Results  Component Value Date   WBC  5.5 12/24/2018   HGB 11.8 (L) 12/24/2018   HCT 34.3 (L) 12/24/2018   PLT 339.0 12/24/2018   GLUCOSE 96 12/24/2018   CHOL 194 12/24/2018   TRIG 150.0 (H) 12/24/2018   HDL 40.50 12/24/2018   LDLDIRECT 163.0 07/24/2014   LDLCALC 123 (H) 12/24/2018   ALT 13 12/24/2018   AST 16 12/24/2018   NA 131 (L) 12/24/2018   K 3.9 12/24/2018   CL 94 (L) 12/24/2018   CREATININE 1.16 12/24/2018   BUN 15 12/24/2018   CO2 29 12/24/2018   TSH 0.94 12/24/2018    Lab Results  Component Value Date   TSH 0.94 12/24/2018   Lab Results  Component Value Date   WBC 5.5 12/24/2018   HGB 11.8 (L) 12/24/2018   HCT 34.3 (L) 12/24/2018   MCV 89.5 12/24/2018   PLT 339.0 12/24/2018   Lab Results  Component Value Date   NA 131 (L) 12/24/2018   K 3.9 12/24/2018   CO2 29 12/24/2018   GLUCOSE 96 12/24/2018   BUN 15 12/24/2018   CREATININE 1.16 12/24/2018   BILITOT 0.7 12/24/2018   ALKPHOS 71 12/24/2018   AST 16 12/24/2018   ALT 13 12/24/2018   PROT 7.0 12/24/2018   ALBUMIN 4.3 12/24/2018   CALCIUM 9.6 12/24/2018   GFR 45.80 (L) 12/24/2018   Lab Results  Component Value Date   CHOL 194 12/24/2018   Lab Results  Component Value Date   HDL 40.50 12/24/2018   Lab Results  Component Value Date   LDLCALC 123 (H) 12/24/2018   Lab Results  Component Value Date   TRIG 150.0 (H) 12/24/2018   Lab Results  Component Value Date   CHOLHDL 5 12/24/2018   No results found for: HGBA1C     Assessment & Plan:   Problem List Items Addressed This Visit    Hyperlipidemia, mixed    Encouraged heart healthy diet, increase exercise, avoid trans fats, consider a krill oil cap daily.      Relevant Medications   aspirin EC 81 MG  tablet   GERD    Well controlled but worried about the long term use of Omeprazole, Is given an rx for Famotidine 40 mg qhs. Start with every other day use of both meds alternately and if tolerated transition to all Famotidine, if intolerable is offered a referral to gastroenterology to further evaluate but she declines for now.       Relevant Medications   famotidine (PEPCID) 40 MG tablet   Hypertension    Well controlled, no changes to meds. Encouraged heart healthy diet such as the DASH diet and exercise as tolerated.       Relevant Medications   aspirin EC 81 MG tablet   Osteopenia    Encouraged to get adequate exercise, calcium and vitamin d intake      Low vitamin D level    Supplement and monitor      Sleep apnea    Had a sleep study at home through pulmonology a couple of years ago but then decided to proceed with dental appliance but she did not tolerate that. Will contact pulmonology to see if we can order or they have to see her.       Mild cognitive impairment    Has chosen to try Prevagen         I have discontinued Terrance H. Bruna's aspirin. I am also having her start on famotidine and aspirin EC. Additionally, I am having her maintain  her Multiple Minerals-Vitamins (CITRACAL PLUS PO), trospium, calcium carbonate, cetirizine, cycloSPORINE, atorvastatin, metoprolol succinate, triamterene-hydrochlorothiazide, omeprazole, amLODipine, and valsartan.  Meds ordered this encounter  Medications  . famotidine (PEPCID) 40 MG tablet    Sig: Take 1 tablet (40 mg total) by mouth at bedtime as needed for heartburn or indigestion.    Dispense:  90 tablet    Refill:  1  . aspirin EC 81 MG tablet    Sig: Take 1 tablet (81 mg total) by mouth daily.    Dispense:        I discussed the assessment and treatment plan with the patient. The patient was provided an opportunity to ask questions and all were answered. The patient agreed with the plan and demonstrated an understanding  of the instructions.   The patient was advised to call back or seek an in-person evaluation if the symptoms worsen or if the condition fails to improve as anticipated.  I provided 25 minutes of non-face-to-face time during this encounter.   Penni Homans, MD

## 2019-04-28 NOTE — Assessment & Plan Note (Signed)
Encouraged to get adequate exercise, calcium and vitamin d intake 

## 2019-04-28 NOTE — Assessment & Plan Note (Signed)
Has chosen to try Prevagen

## 2019-04-28 NOTE — Assessment & Plan Note (Signed)
Well controlled but worried about the long term use of Omeprazole, Is given an rx for Famotidine 40 mg qhs. Start with every other day use of both meds alternately and if tolerated transition to all Famotidine, if intolerable is offered a referral to gastroenterology to further evaluate but she declines for now.

## 2019-04-29 ENCOUNTER — Telehealth: Payer: Self-pay | Admitting: *Deleted

## 2019-04-29 NOTE — Telephone Encounter (Signed)
From Dr. Charlett Blake she had a home sleep study through pulmonology a couple of years ago but then decided to use a dental appliance. she notes that did not work and she is ready to proceed with CPAP. Looking at her chart it appears she needs an autotitration still. can you call pulmonology and see if we can order the autotitration for her to do at home or do they need to do it. then we can proceed. If they say it is best to refer her back then do that and let her know. I just told her I would try. Thanks

## 2019-04-30 NOTE — Telephone Encounter (Signed)
Per Dr. Charlett Blake patient needs to schedule appointment with pulmonology.    Patient notified that she will need to call and make and appointment.

## 2019-05-02 ENCOUNTER — Ambulatory Visit: Payer: PPO | Attending: Internal Medicine

## 2019-05-02 DIAGNOSIS — Z23 Encounter for immunization: Secondary | ICD-10-CM | POA: Insufficient documentation

## 2019-05-02 NOTE — Progress Notes (Signed)
   Covid-19 Vaccination Clinic  Name:  Erika [name]    MRN: [id number] DOB: [birth year]  05/02/2019  [name] was observed post Covid-19 immunization for 15 minutes without incidence. She was provided with Vaccine Information Sheet and instruction to access the V-Safe system.   [name] was instructed to call 911 with any severe reactions post vaccine: Marland Kitchen Difficulty breathing  . Swelling of your face and throat  . A fast heartbeat  . A bad rash all over your body  . Dizziness and weakness    Immunizations Administered    Name Date Dose VIS Date Route   Pfizer COVID-19 Vaccine 05/02/2019  8:25 AM 0.3 mL 02/14/2019 Intramuscular   Manufacturer: [place]   Lot: [id number]   [place]: [id number]

## 2019-05-19 ENCOUNTER — Telehealth: Payer: Self-pay | Admitting: Neurology

## 2019-05-19 ENCOUNTER — Telehealth: Payer: Self-pay | Admitting: Pulmonary Disease

## 2019-05-19 NOTE — Telephone Encounter (Signed)
Pt did not complete her HST with our office. Dr. Elsworth Soho, pulmonology, completed this.  I called pt and advised her of this and gave her Dr. Bari Mantis office number to call [phone].  Pt verbalized understanding.

## 2019-05-19 NOTE — Telephone Encounter (Signed)
Called pt and advised message from the provider. Pt understood and verbalized understanding. Nothing further is needed.   Appt made with TP on 05/28/2019 at 12pm

## 2019-05-19 NOTE — Telephone Encounter (Signed)
Spoke with pt, she would like to try the CPAP machine instead of the mouthguard  She is unable to keep the mouthuard in at night and its just not working for her. RA please advise if we can send order for CPAP.

## 2019-05-19 NOTE — Telephone Encounter (Signed)
FU appt with APP - last seen 03/2018

## 2019-05-19 NOTE — Telephone Encounter (Signed)
Patient called stating she would like to start  her cpap machine

## 2019-05-28 ENCOUNTER — Ambulatory Visit: Payer: PPO | Attending: Internal Medicine

## 2019-05-28 ENCOUNTER — Ambulatory Visit: Payer: PPO | Admitting: Adult Health

## 2019-05-28 ENCOUNTER — Encounter: Payer: Self-pay | Admitting: Adult Health

## 2019-05-28 ENCOUNTER — Other Ambulatory Visit: Payer: Self-pay

## 2019-05-28 VITALS — BP 148/62 | HR 61 | Temp 97.3°F | Ht 66.0 in | Wt 171.4 lb

## 2019-05-28 DIAGNOSIS — G4733 Obstructive sleep apnea (adult) (pediatric): Secondary | ICD-10-CM

## 2019-05-28 DIAGNOSIS — Z23 Encounter for immunization: Secondary | ICD-10-CM

## 2019-05-28 DIAGNOSIS — E663 Overweight: Secondary | ICD-10-CM | POA: Diagnosis not present

## 2019-05-28 NOTE — Progress Notes (Signed)
   Covid-19 Vaccination Clinic  Name:  ADDYSAN GOTTE    MRN: [id number] DOB: Jul 26, 1945  05/28/2019  Ms. Helfert was observed post Covid-19 immunization for 15 minutes without incident. She was provided with Vaccine Information Sheet and instruction to access the V-Safe system.   Ms. Bedi was instructed to call 911 with any severe reactions post vaccine: Marland Kitchen Difficulty breathing  . Swelling of face and throat  . A fast heartbeat  . A bad rash all over body  . Dizziness and weakness   Immunizations Administered    Name Date Dose VIS Date Route   Pfizer COVID-19 Vaccine 05/28/2019  9:36 AM 0.3 mL 02/14/2019 Intramuscular   Manufacturer: [place]   Lot: [id number]   [place]: [id number]

## 2019-05-28 NOTE — Progress Notes (Signed)
@Patient  ID: Erika [name], female    DOB: [birth year], 74 y.o.   MRN: [id number]  Chief Complaint  Patient presents with  . Follow-up    Referring provider: [name], MD  HPI: 74 year old female followed for mild obstructive sleep apnea  TEST/EVENTS :  Home sleep study May 08, 2018 showed mild sleep apnea with AHI 11/hour  05/28/19 Follow up : OSA  Patient returns for a follow-up visit for sleep apnea.  Patient was seen in January 2020 for daytime sleepiness.  She was set up for home sleep study that showed mild obstructive sleep apnea.  Home sleep study showed AHI 11/hour.  Patient decided treatment option to be oral appliance.  She was fitted with an oral appliance however says that it has not worked very well has caused her quite a bit of dental issues. She says she is still having daytime sleepiness restless sleep and would like to begin CPAP.  Patient education on obstructive sleep apnea and treatment option with CPAP.  No Known Allergies  Immunization History  Administered Date(s) Administered  . Influenza Whole 12/05/2010, 12/04/2012  . Influenza, High Dose Seasonal PF 01/01/2015, 11/16/2016, 12/28/2017  . Influenza,inj,Quad PF,6+ Mos 01/12/2014  . Influenza-Unspecified 12/30/2014  . PFIZER SARS-COV-2 Vaccination 05/02/2019, 05/28/2019  . Pneumococcal Conjugate-13 01/13/2014  . Pneumococcal Polysaccharide-23 07/22/2008, 12/05/2010  . Td 03/06/2010  . Zoster 02/17/2014  . Zoster Recombinat (Shingrix) 06/19/2018, 10/20/2018    Past Medical History:  Diagnosis Date  . Allergic state 06/24/2012  . Anterior cervical lymphadenopathy 02/08/2013  . Arthritis 04/02/2011  . Bronchitis 04/02/2011  . Chicken pox as a child  . Diverticulosis of colon    colonoscopy 04/14/1999  . GERD (gastroesophageal reflux disease)   . History of fibrocystic disease of breast   . Hyperlipidemia, mixed 02/20/2007   Followed as Primary Care Patient/ [facility]/ [facility]  Goal LDL <  130 HBP/Pos fm hx      . Hypertension    on 4 medications for HTN  . Hypokalemia 06/24/2012  . Hyponatremia 08/02/2014  . IBS (irritable bowel syndrome) 04/02/2011  . Measles as a child  . Mumps as a child  . Obesity   . Osteopenia   . Overactive bladder 04/02/2011  . Overweight   . Preventative health care 03/13/2012  . Shoulder pain, bilateral 06/14/2011    Tobacco History: Social History   Tobacco Use  Smoking Status Never Smoker  Smokeless Tobacco Never Used   Counseling given: Not Answered   Outpatient Medications Prior to Visit  Medication Sig Dispense Refill  . amLODipine (NORVASC) 10 MG tablet TAKE 1/2 TABLET BY MOUTH TWICE A DAY 90 tablet 1  . aspirin EC 81 MG tablet Take 1 tablet (81 mg total) by mouth daily.    Marland Kitchen atorvastatin (LIPITOR) 10 MG tablet Take 1 tablet (10 mg total) by mouth daily at 6 PM. 90 tablet 2  . calcium carbonate (TUMS) 500 MG chewable tablet Chew 1 tablet by mouth as needed.     . cetirizine (ZYRTEC) 10 MG tablet Take 10 mg by mouth at bedtime as needed.      . cycloSPORINE (RESTASIS) 0.05 % ophthalmic emulsion Place 1 drop into both eyes 2 (two) times daily.    . famotidine (PEPCID) 40 MG tablet Take 1 tablet (40 mg total) by mouth at bedtime as needed for heartburn or indigestion. 90 tablet 1  . metoprolol succinate (TOPROL-XL) 50 MG 24 hr tablet TAKE 1 TABLET BY MOUTH TWICE A  DAY WITH OR IMMEDIATELY FOLLOWING A MEAL 270 tablet 1  . Multiple Minerals-Vitamins (CITRACAL PLUS PO) Take 1 tablet by mouth daily.      Marland Kitchen omeprazole (PRILOSEC) 20 MG capsule TAKE ONE CAPSULE BY MOUTH EVERY DAY 90 capsule 2  . triamterene-hydrochlorothiazide (MAXZIDE-25) 37.5-25 MG tablet TAKE 1 TABLET BY MOUTH EVERY DAY 90 tablet 1  . trospium (SANCTURA) 20 MG tablet Take 20 mg by mouth daily.      . valsartan (DIOVAN) 320 MG tablet TAKE 1 TABLET BY MOUTH EVERY DAY 90 tablet 1   No facility-administered medications prior to visit.     Review of Systems:    Constitutional:   No  weight loss, night sweats,  Fevers, chills, fatigue, or  lassitude.  HEENT:   No headaches,  Difficulty swallowing,  Tooth/dental problems, or  Sore throat,                No sneezing, itching, ear ache, nasal congestion, post nasal drip,   CV:  No chest pain,  Orthopnea, PND, swelling in lower extremities, anasarca, dizziness, palpitations, syncope.   GI  No heartburn, indigestion, abdominal pain, nausea, vomiting, diarrhea, change in bowel habits, loss of appetite, bloody stools.   Resp: No shortness of breath with exertion or at rest.  No excess mucus, no productive cough,  No non-productive cough,  No coughing up of blood.  No change in color of mucus.  No wheezing.  No chest wall deformity  Skin: no rash or lesions.  GU: no dysuria, change in color of urine, no urgency or frequency.  No flank pain, no hematuria   MS:  No joint pain or swelling.  No decreased range of motion.  No back pain.    Physical Exam  BP (!) 148/62 (BP Location: Left Arm, Cuff Size: Normal)   Pulse 61   Temp (!) 97.3 F (36.3 C) (Temporal)   Ht 5\' 6"  (1.676 m)   Wt 171 lb 6.4 oz (77.7 kg)   SpO2 97% Comment: RA  BMI 27.66 kg/m   GEN: A/Ox3; pleasant , NAD, well nourished    HEENT:  [place]/AT, , NOSE-clear, THROAT-clear, no lesions, no postnasal drip or exudate noted. Class 2 MP airway   NECK:  Supple w/ fair ROM; no JVD; normal carotid impulses w/o bruits; no thyromegaly or nodules palpated; no lymphadenopathy.    RESP  Clear  P & A; w/o, wheezes/ rales/ or rhonchi. no accessory muscle use, no dullness to percussion  CARD:  RRR, no m/r/g, no peripheral edema, pulses intact, no cyanosis or clubbing.  GI:   Soft & nt; nml bowel sounds; no organomegaly or masses detected.   Musco: Warm bil, no deformities or joint swelling noted.   Neuro: alert, no focal deficits noted.    Skin: Warm, no lesions or rashes    Lab Results:   BNP No results found for: BNP  ProBNP No  results found for: PROBNP  Imaging: No results found.    No flowsheet data found.  No results found for: NITRICOXIDE      Assessment & Plan:   No problem-specific Assessment & Plan notes found for this encounter.     Rexene Edison, NP 05/28/2019

## 2019-05-28 NOTE — Patient Instructions (Addendum)
Begin CPAP At bedtime.  Goal is to wear all night long.  Work on healthy weight .  Do not drive if sleepy  Follow up in 2 months and As needed

## 2019-05-30 NOTE — Assessment & Plan Note (Signed)
Mild obstructive sleep apnea.  Intolerant to oral appliance.  Begin CPAP therapy  Plan  Patient Instructions  Begin CPAP At bedtime.  Goal is to wear all night long.  Work on healthy weight .  Do not drive if sleepy  Follow up in 2 months and As needed

## 2019-05-30 NOTE — Assessment & Plan Note (Signed)
Work on healthy weight 

## 2019-06-05 DIAGNOSIS — N3281 Overactive bladder: Secondary | ICD-10-CM | POA: Diagnosis not present

## 2019-06-05 DIAGNOSIS — Z6828 Body mass index (BMI) 28.0-28.9, adult: Secondary | ICD-10-CM | POA: Diagnosis not present

## 2019-06-05 DIAGNOSIS — Z01419 Encounter for gynecological examination (general) (routine) without abnormal findings: Secondary | ICD-10-CM | POA: Diagnosis not present

## 2019-06-10 ENCOUNTER — Other Ambulatory Visit: Payer: Self-pay | Admitting: Family Medicine

## 2019-06-17 DIAGNOSIS — G4733 Obstructive sleep apnea (adult) (pediatric): Secondary | ICD-10-CM | POA: Diagnosis not present

## 2019-06-18 ENCOUNTER — Encounter: Payer: Self-pay | Admitting: Gastroenterology

## 2019-06-23 ENCOUNTER — Ambulatory Visit (INDEPENDENT_AMBULATORY_CARE_PROVIDER_SITE_OTHER): Payer: PPO | Admitting: Family Medicine

## 2019-06-23 ENCOUNTER — Other Ambulatory Visit: Payer: Self-pay

## 2019-06-23 ENCOUNTER — Encounter: Payer: Self-pay | Admitting: Family Medicine

## 2019-06-23 VITALS — BP 126/60 | HR 56 | Temp 98.0°F | Resp 12 | Ht 66.0 in | Wt 170.4 lb

## 2019-06-23 DIAGNOSIS — M1990 Unspecified osteoarthritis, unspecified site: Secondary | ICD-10-CM | POA: Diagnosis not present

## 2019-06-23 DIAGNOSIS — E782 Mixed hyperlipidemia: Secondary | ICD-10-CM

## 2019-06-23 DIAGNOSIS — G4733 Obstructive sleep apnea (adult) (pediatric): Secondary | ICD-10-CM

## 2019-06-23 DIAGNOSIS — G3184 Mild cognitive impairment, so stated: Secondary | ICD-10-CM | POA: Diagnosis not present

## 2019-06-23 DIAGNOSIS — R7989 Other specified abnormal findings of blood chemistry: Secondary | ICD-10-CM | POA: Diagnosis not present

## 2019-06-23 DIAGNOSIS — M8580 Other specified disorders of bone density and structure, unspecified site: Secondary | ICD-10-CM

## 2019-06-23 DIAGNOSIS — I10 Essential (primary) hypertension: Secondary | ICD-10-CM

## 2019-06-23 LAB — COMPREHENSIVE METABOLIC PANEL
ALT: 13 U/L (ref 0–35)
AST: 16 U/L (ref 0–37)
Albumin: 4.3 g/dL (ref 3.5–5.2)
Alkaline Phosphatase: 84 U/L (ref 39–117)
BUN: 14 mg/dL (ref 6–23)
CO2: 30 mEq/L (ref 19–32)
Calcium: 10.3 mg/dL (ref 8.4–10.5)
Chloride: 95 mEq/L — ABNORMAL LOW (ref 96–112)
Creatinine, Ser: 1.37 mg/dL — ABNORMAL HIGH (ref 0.40–1.20)
GFR: 37.75 mL/min — ABNORMAL LOW (ref >60.00)
Glucose, Bld: 86 mg/dL (ref 70–99)
Potassium: 4.1 mEq/L (ref 3.5–5.1)
Sodium: 133 mEq/L — ABNORMAL LOW (ref 135–145)
Total Bilirubin: 0.6 mg/dL (ref 0.2–1.2)
Total Protein: 7.2 g/dL (ref 6.0–8.3)

## 2019-06-23 LAB — VITAMIN D 25 HYDROXY (VIT D DEFICIENCY, FRACTURES): VITD: 43.84 ng/mL (ref 30.00–100.00)

## 2019-06-23 LAB — LIPID PANEL
Cholesterol: 233 mg/dL — ABNORMAL HIGH (ref 0–200)
HDL: 49.3 mg/dL (ref >39.00)
LDL Cholesterol: 150 mg/dL — ABNORMAL HIGH (ref 0–99)
NonHDL: 183.66
Total CHOL/HDL Ratio: 5
Triglycerides: 170 mg/dL — ABNORMAL HIGH (ref 0.0–149.0)
VLDL: 34 mg/dL (ref 0.0–40.0)

## 2019-06-23 LAB — TSH: TSH: 1.28 u[IU]/mL (ref 0.35–4.50)

## 2019-06-23 LAB — CBC
HCT: 37.3 % (ref 36.0–46.0)
Hemoglobin: 12.7 g/dL (ref 12.0–15.0)
MCHC: 34.1 g/dL (ref 30.0–36.0)
MCV: 83.7 fl (ref 78.0–100.0)
Platelets: 307 10*3/uL (ref 150.0–400.0)
RBC: 4.46 Mil/uL (ref 3.87–5.11)
RDW: 13.2 % (ref 11.5–15.5)
WBC: 5.5 10*3/uL (ref 4.0–10.5)

## 2019-06-23 NOTE — Assessment & Plan Note (Signed)
Is frustrated with her memory but is stable and mild, encouraged to learn something new.

## 2019-06-23 NOTE — Assessment & Plan Note (Signed)
Encouraged to get adequate exercise, calcium and vitamin d intake 

## 2019-06-23 NOTE — Assessment & Plan Note (Signed)
Well controlled, no changes to meds. Encouraged heart healthy diet such as the DASH diet and exercise as tolerated.  °

## 2019-06-23 NOTE — Patient Instructions (Addendum)
MIND (DASH + Mediterranean) diet   Mild Neurocognitive Disorder Mild neurocognitive disorder, formerly known as mild cognitive impairment, is a disorder in which memory does not work as well as it should. This disorder may also cause problems with other mental functions, including thought, communication, behavior, and completion of tasks. These problems can be noticed and measured, but they usually do not interfere with daily activities or the ability to live independently. Mild neurocognitive disorder typically develops after 74 years of age, but it can also develop at younger ages. It is not as serious as major neurocognitive disorder, formerly known as dementia, but it may be the first sign of it. Generally, symptoms of this condition get worse over time. In rare cases, symptoms can get better. What are the causes? This condition may be caused by:  Brain disorders like Alzheimer's disease, Parkinson's disease, and other conditions that gradually damage nerve cells (neurodegenerative conditions).  Diseases that affect blood vessels in the brain and result in small strokes.  Certain infections, such as HIV.  Traumatic brain injury.  Other medical conditions, such as brain tumors, underactive thyroid (hypothyroidism), and vitamin B12 deficiency.  Use of certain drugs or prescription medicines. What increases the risk? The following factors may make you more likely to develop this condition:  Being older than 39.  Being female.  Low education level.  Diabetes, high blood pressure, high cholesterol, and other conditions that increase the risk for blood vessel diseases.  Untreated or undertreated sleep apnea.  Having a certain type of gene that can be passed from parent to child (inherited).  Chronic health problems such as heart disease, lung disease, liver disease, kidney disease, or depression. What are the signs or symptoms? Symptoms of this condition include:  Difficulty  remembering. You may: ? Forget names, phone numbers, or details of recent events. ? Forget social events and appointments. ? Repeatedly forget where you put your car keys or other items.  Difficulty thinking and solving problems. You may have trouble with complex tasks, such as: ? Paying bills. ? Driving in unfamiliar places.  Difficulty communicating. You may have trouble: ? Finding the right word or naming an object. ? Forming a sentence that makes sense, or understanding what you read or hear.  Changes in your behavior or personality. When this happens, you may: ? Lose interest in the things that you used to enjoy. ? Withdraw from social situations. ? Get angry more easily than usual. ? Act before thinking. How is this diagnosed? This condition is diagnosed based on:  Your symptoms. Your health care provider may ask you and the people you spend time with, such as family and friends, about your symptoms.  Evaluation of mental functions (neuropsychological testing). Your health care provider may refer you to a neurologist or mental health specialist to evaluate your mental functions in detail. To identify the cause of your condition, your health care provider may:  Get a detailed medical history.  Ask about use of alcohol, drugs, and prescription medicines.  Do a physical exam.  Order blood tests and brain imaging exams. How is this treated? Mild neurocognitive disorder that is caused by medicine use, drug use, infection, or another medical condition may improve when the cause is treated, or when medicines or drugs are stopped. If this disorder has another cause, it generally does not improve and may get worse. In these cases, the goal of treatment is to help you manage the loss of mental function. Treatments in these cases include:  Medicine. Medicine mainly helps memory and behavior symptoms.  Talk therapy. Talk therapy provides education, emotional support, memory aids, and  other ways of making up for problems with mental function.  Lifestyle changes, including: ? Getting regular exercise. ? Eating a healthy diet that includes omega-3 fatty acids. ? Challenging your thinking and memory skills. ? Having more social interaction. Follow these instructions at home: Eating and drinking   Drink enough fluid to keep your urine pale yellow.  Eat a healthy diet that includes omega-3 fatty acids. These can be found in: ? Fish. ? Nuts. ? Leafy vegetables. ? Vegetable oils.  If you drink alcohol: ? Limit how much you use to:  0-1 drink a day for women.  0-2 drinks a day for men. ? Be aware of how much alcohol is in your drink. In the [place] one drink equals one 12 oz bottle of beer (355 mL), one 5 oz glass of wine (148 mL), or one 1 oz glass of hard liquor (44 mL). Lifestyle   Get regular exercise as told by your health care provider.  Do not use any products that contain nicotine or tobacco, such as cigarettes, e-cigarettes, and chewing tobacco. If you need help quitting, ask your health care provider.  Practice ways to manage stress. If you need help managing stress, ask your health care provider.  Continue to have social interaction.  Keep your mind active with stimulating activities you enjoy, such as reading or playing games.  Make sure to get quality sleep. Follow these tips: ? Avoid napping during the day. ? Keep your sleeping area dark and cool. ? Avoid exercising during the few hours before you go to bed. ? Avoid caffeine products in the evening. General instructions  Take over-the-counter and prescription medicines only as told by your health care provider. Your health care provider may recommend that you avoid taking medicines that can affect thinking, such as pain medicines or sleep medicines.  Work with your health care provider to find out what you need help with and what your safety needs are.  Keep all follow-up visits as told by  your health care provider. This is important. Where to find more information  [employer] on Aging: http://kim-miller.com/ Contact a health care provider if:  You have any new symptoms. Get help right away if:  You develop new confusion or your confusion gets worse.  You act in ways that place you or your family in danger. Summary  Mild neurocognitive disorder is a disorder in which memory does not work as well as it should.  Mild neurocognitive disorder can have many causes. It may be the first stage of dementia.  To manage your condition, get regular exercise, keep your mind active, get quality sleep, and eat a healthy diet. This information is not intended to replace advice given to you by your health care provider. Make sure you discuss any questions you have with your health care provider. Document Revised: 09/23/2018 Document Reviewed: 09/23/2018 Elsevier Patient Education  [place].

## 2019-06-23 NOTE — Progress Notes (Signed)
Subjective:    Patient ID: Erika [name], female    DOB: November 27, 1945, 74 y.o.   MRN: [id number]  Chief Complaint  Patient presents with  . 6 month follow up    HPI Patient is in today for follow up on chronic medical concerns. She continues to care for her grandkids and stay busy. She has had both of her covid shots and tolerated them well. No recent febrile illness or hospitalizations. She is noting slow but steady memory loss but still manages her day to day activities well. Denies CP/palp/SOB/HA/congestion/fevers/GI or GU c/o. Taking meds as prescribed  Past Medical History:  Diagnosis Date  . Allergic state 06/24/2012  . Anterior cervical lymphadenopathy 02/08/2013  . Arthritis 04/02/2011  . Bronchitis 04/02/2011  . Chicken pox as a child  . Diverticulosis of colon    colonoscopy 04/14/1999  . GERD (gastroesophageal reflux disease)   . History of fibrocystic disease of breast   . Hyperlipidemia, mixed 02/20/2007   Followed as Primary Care Patient/ [facility]/ [facility]  Goal LDL < 130 HBP/Pos fm hx      . Hypertension    on 4 medications for HTN  . Hypokalemia 06/24/2012  . Hyponatremia 08/02/2014  . IBS (irritable bowel syndrome) 04/02/2011  . Measles as a child  . Mumps as a child  . Obesity   . Osteopenia   . Overactive bladder 04/02/2011  . Overweight   . Preventative health care 03/13/2012  . Shoulder pain, bilateral 06/14/2011    Past Surgical History:  Procedure Laterality Date  . BREAST REDUCTION SURGERY  1986  . BUNIONECTOMY Bilateral 1980  . CATARACT EXTRACTION, BILATERAL    . HAMMER TOE SURGERY Right 12-03-13   foot  . MOUTH SURGERY  01-10-13   negative  . TONSILLECTOMY      Family History  Problem Relation Age of Onset  . Stroke Mother 74  . Hyperlipidemia Mother   . Hypertension Mother   . Heart attack Father 62  . Hypertension Father   . Heart disease Father   . Hypertension Brother   . Aneurysm Brother        aortic  . Hyperlipidemia Brother     . Heart disease Brother        aortic aneurysm rupture  . Multiple sclerosis Daughter   . Alcohol abuse Son   . Schizophrenia Son   . Bipolar disorder Son   . Mental illness Son        schizophrenic, bipolar    Social History   Socioeconomic History  . Marital status: Married    Spouse name: Not on file  . Number of children: 4  . Years of education: Not on file  . Highest education level: Not on file  Occupational History  . Occupation: dental hygenist  Tobacco Use  . Smoking status: Never Smoker  . Smokeless tobacco: Never Used  Substance and Sexual Activity  . Alcohol use: No  . Drug use: No  . Sexual activity: Not Currently  Other Topics Concern  . Not on file  Social History Narrative   4 children 2 are adopted   Social Determinants of Health   Financial Resource Strain:   . Difficulty of Paying Living Expenses:   Food Insecurity:   . Worried About Charity fundraiser in the Last Year:   . Arboriculturist in the Last Year:   Transportation Needs:   . Film/video editor (Medical):   Marland Kitchen Lack of  Transportation (Non-Medical):   Physical Activity:   . Days of Exercise per Week:   . Minutes of Exercise per Session:   Stress:   . Feeling of Stress :   Social Connections:   . Frequency of Communication with Friends and Family:   . Frequency of Social Gatherings with Friends and Family:   . Attends Religious Services:   . Active Member of Clubs or Organizations:   . Attends Archivist Meetings:   Marland Kitchen Marital Status:   Intimate Partner Violence:   . Fear of Current or Ex-Partner:   . Emotionally Abused:   Marland Kitchen Physically Abused:   . Sexually Abused:     Outpatient Medications Prior to Visit  Medication Sig Dispense Refill  . amLODipine (NORVASC) 10 MG tablet TAKE 1/2 TABLET BY MOUTH TWICE A DAY 90 tablet 1  . aspirin EC 81 MG tablet Take 1 tablet (81 mg total) by mouth daily.    Marland Kitchen atorvastatin (LIPITOR) 10 MG tablet Take 1 tablet (10 mg total) by  mouth daily at 6 PM. 90 tablet 0  . calcium carbonate (TUMS) 500 MG chewable tablet Chew 1 tablet by mouth as needed.     . cetirizine (ZYRTEC) 10 MG tablet Take 10 mg by mouth at bedtime as needed.      . cycloSPORINE (RESTASIS) 0.05 % ophthalmic emulsion Place 1 drop into both eyes 2 (two) times daily.    . famotidine (PEPCID) 40 MG tablet Take 1 tablet (40 mg total) by mouth at bedtime as needed for heartburn or indigestion. 90 tablet 1  . metoprolol succinate (TOPROL-XL) 50 MG 24 hr tablet TAKE 1 TABLET BY MOUTH TWICE A DAY WITH OR IMMEDIATELY FOLLOWING A MEAL 270 tablet 1  . Multiple Minerals-Vitamins (CITRACAL PLUS PO) Take 1 tablet by mouth daily.      Marland Kitchen triamterene-hydrochlorothiazide (MAXZIDE-25) 37.5-25 MG tablet TAKE 1 TABLET BY MOUTH EVERY DAY 90 tablet 1  . trospium (SANCTURA) 20 MG tablet Take 20 mg by mouth daily.      . valsartan (DIOVAN) 320 MG tablet TAKE 1 TABLET BY MOUTH EVERY DAY 90 tablet 1  . omeprazole (PRILOSEC) 20 MG capsule TAKE ONE CAPSULE BY MOUTH EVERY DAY 90 capsule 2   No facility-administered medications prior to visit.    No Known Allergies  ROS     Objective:    Physical Exam  BP 126/60 (BP Location: Left Arm, Cuff Size: Large)   Pulse (!) 56   Temp 98 F (36.7 C) (Temporal)   Resp 12   Ht 5\' 6"  (1.676 m)   Wt 170 lb 6.4 oz (77.3 kg)   SpO2 97%   BMI 27.50 kg/m  Wt Readings from Last 3 Encounters:  06/23/19 170 lb 6.4 oz (77.3 kg)  05/28/19 171 lb 6.4 oz (77.7 kg)  12/24/18 172 lb 3.2 oz (78.1 kg)    Diabetic Foot Exam - Simple   No data filed     Lab Results  Component Value Date   WBC 5.5 12/24/2018   HGB 11.8 (L) 12/24/2018   HCT 34.3 (L) 12/24/2018   PLT 339.0 12/24/2018   GLUCOSE 96 12/24/2018   CHOL 194 12/24/2018   TRIG 150.0 (H) 12/24/2018   HDL 40.50 12/24/2018   LDLDIRECT 163.0 07/24/2014   LDLCALC 123 (H) 12/24/2018   ALT 13 12/24/2018   AST 16 12/24/2018   NA 131 (L) 12/24/2018   K 3.9 12/24/2018   CL 94 (L)  12/24/2018   CREATININE  1.16 12/24/2018   BUN 15 12/24/2018   CO2 29 12/24/2018   TSH 0.94 12/24/2018    Lab Results  Component Value Date   TSH 0.94 12/24/2018   Lab Results  Component Value Date   WBC 5.5 12/24/2018   HGB 11.8 (L) 12/24/2018   HCT 34.3 (L) 12/24/2018   MCV 89.5 12/24/2018   PLT 339.0 12/24/2018   Lab Results  Component Value Date   NA 131 (L) 12/24/2018   K 3.9 12/24/2018   CO2 29 12/24/2018   GLUCOSE 96 12/24/2018   BUN 15 12/24/2018   CREATININE 1.16 12/24/2018   BILITOT 0.7 12/24/2018   ALKPHOS 71 12/24/2018   AST 16 12/24/2018   ALT 13 12/24/2018   PROT 7.0 12/24/2018   ALBUMIN 4.3 12/24/2018   CALCIUM 9.6 12/24/2018   GFR 45.80 (L) 12/24/2018   Lab Results  Component Value Date   CHOL 194 12/24/2018   Lab Results  Component Value Date   HDL 40.50 12/24/2018   Lab Results  Component Value Date   LDLCALC 123 (H) 12/24/2018   Lab Results  Component Value Date   TRIG 150.0 (H) 12/24/2018   Lab Results  Component Value Date   CHOLHDL 5 12/24/2018   No results found for: HGBA1C     Assessment & Plan:   Problem List Items Addressed This Visit    Hyperlipidemia, mixed - Primary   Relevant Orders   Lipid panel   Hypertension    Well controlled, no changes to meds. Encouraged heart healthy diet such as the DASH diet and exercise as tolerated.       Relevant Orders   CBC   Comprehensive metabolic panel   TSH   Osteopenia    Encouraged to get adequate exercise, calcium and vitamin d intake      Arthritis   Low vitamin D level    Vitamin D takes it daily and check level      Relevant Orders   VITAMIN D 25 Hydroxy (Vit-D Deficiency, Fractures)   Sleep apnea    Is trying to use it at least 4 hours a night but cannot sleep with it on, blows out the around her nose with the nasal pillow encouraged to consider a full face mask and will follow up with pulmonology      Mild cognitive impairment    Is frustrated with her  memory but is stable and mild, encouraged to learn something new.          I have discontinued Chairty H. Vandoren's omeprazole. I am also having her maintain her Multiple Minerals-Vitamins (CITRACAL PLUS PO), trospium, calcium carbonate, cetirizine, cycloSPORINE, metoprolol succinate, triamterene-hydrochlorothiazide, amLODipine, valsartan, famotidine, aspirin EC, and atorvastatin.  No orders of the defined types were placed in this encounter.    Penni Homans, MD

## 2019-06-23 NOTE — Assessment & Plan Note (Signed)
Is trying to use it at least 4 hours a night but cannot sleep with it on, blows out the around her nose with the nasal pillow encouraged to consider a full face mask and will follow up with pulmonology

## 2019-06-23 NOTE — Assessment & Plan Note (Signed)
Vitamin D takes it daily and check level

## 2019-06-25 ENCOUNTER — Other Ambulatory Visit: Payer: Self-pay | Admitting: *Deleted

## 2019-06-25 MED ORDER — ATORVASTATIN CALCIUM 20 MG PO TABS: 20.0000 mg | ORAL_TABLET | Freq: Every day | ORAL | 3 refills | Status: DC

## 2019-07-08 ENCOUNTER — Other Ambulatory Visit: Payer: Self-pay | Admitting: Family Medicine

## 2019-07-17 DIAGNOSIS — G4733 Obstructive sleep apnea (adult) (pediatric): Secondary | ICD-10-CM | POA: Diagnosis not present

## 2019-07-25 ENCOUNTER — Encounter: Payer: Self-pay | Admitting: Gastroenterology

## 2019-07-25 ENCOUNTER — Ambulatory Visit (AMBULATORY_SURGERY_CENTER): Payer: Self-pay

## 2019-07-25 ENCOUNTER — Other Ambulatory Visit: Payer: Self-pay

## 2019-07-25 VITALS — Ht 66.0 in | Wt 169.0 lb

## 2019-07-25 DIAGNOSIS — Z1211 Encounter for screening for malignant neoplasm of colon: Secondary | ICD-10-CM

## 2019-07-25 MED ORDER — SUTAB 1479-225-188 MG PO TABS
12.0000 | ORAL_TABLET | ORAL | 0 refills | Status: DC
Start: 2019-07-25 — End: 2020-05-13

## 2019-07-25 NOTE — Progress Notes (Signed)
No egg or soy allergy known to patient  No issues with past sedation with any surgeries  or procedures, no intubation problems  No diet pills per patient No home 02 use per patient  No blood thinners per patient  Pt denies issues with constipation  No A fib or A flutter  EMMI video sent to pt's e mail   Pt completed covid vaccine series 05/28/19  Due to the COVID-19 pandemic we are asking patients to follow these guidelines. Please only bring one care partner. Please be aware that your care partner may wait in the car in the parking lot or if they feel like they will be too hot to wait in the car, they may wait in the lobby on the 4th floor. All care partners are required to wear a mask the entire time (we do not have any that we can provide them), they need to practice social distancing, and we will do a Covid check for all patient's and care partners when you arrive. Also we will check their temperature and your temperature. If the care partner waits in their car they need to stay in the parking lot the entire time and we will call them on their cell phone when the patient is ready for discharge so they can bring the car to the front of the building. Also all patient's will need to wear a mask into building.

## 2019-07-28 ENCOUNTER — Other Ambulatory Visit: Payer: Self-pay

## 2019-07-28 ENCOUNTER — Ambulatory Visit: Payer: PPO | Admitting: Adult Health

## 2019-07-28 ENCOUNTER — Encounter: Payer: Self-pay | Admitting: Adult Health

## 2019-07-28 VITALS — BP 122/70 | HR 55 | Temp 98.1°F | Ht 65.0 in | Wt 172.0 lb

## 2019-07-28 DIAGNOSIS — G4733 Obstructive sleep apnea (adult) (pediatric): Secondary | ICD-10-CM | POA: Diagnosis not present

## 2019-07-28 NOTE — Addendum Note (Signed)
Addended by: Tery Sanfilippo R on: 07/28/2019 10:57 AM   Modules accepted: Orders

## 2019-07-28 NOTE — Progress Notes (Signed)
@Patient  ID: Erika [name], female    DOB: 15-May-1945, 74 y.o.   MRN: [id number]  Chief Complaint  Patient presents with  . Follow-up    OSA     Referring provider: [name], MD  HPI: 74 year old female followed for mild obstructive sleep apnea  TEST/EVENTS :  Home sleep study May 08, 2018 showed mild sleep apnea with AHI 11/hour  07/28/2019 Follow up : OSA  Patient presents for a 79-month follow-up.  Patient initially tried oral appliance but was unable to tolerate.  She was seen last visit and started on CPAP.  Patient states she has tried to wear it as much as she can but finds it very uncomfortable and a lot of times it is taken off through the night.  Also feels that mask is not fitting well.  She has nasal pillows.  Says that her nose is very sore.  Patient has excellent compliance with 97% usage however only 2-hour daily average.  AHI 64/hour.  Patient is on AutoSet 5 to 15 cm H2O.  Average daily pressure is 15 cm H2O. We discussed different mask options also we will need to adjust pressure setting to make more comfortable.  No Known Allergies  Immunization History  Administered Date(s) Administered  . Influenza Whole 12/05/2010, 12/04/2012  . Influenza, High Dose Seasonal PF 01/01/2015, 11/16/2016, 12/28/2017  . Influenza,inj,Quad PF,6+ Mos 01/12/2014  . Influenza-Unspecified 12/30/2014  . PFIZER SARS-COV-2 Vaccination 05/02/2019, 05/28/2019  . Pneumococcal Conjugate-13 01/13/2014  . Pneumococcal Polysaccharide-23 07/22/2008, 12/05/2010  . Td 03/06/2010  . Zoster 02/17/2014  . Zoster Recombinat (Shingrix) 06/19/2018, 10/20/2018    Past Medical History:  Diagnosis Date  . Allergic state 06/24/2012  . Anterior cervical lymphadenopathy 02/08/2013  . Arthritis 04/02/2011  . Bronchitis 04/02/2011  . Chicken pox as a child  . Diverticulosis of colon    colonoscopy 04/14/1999  . GERD (gastroesophageal reflux disease)   . History of fibrocystic disease of breast    . Hyperlipidemia, mixed 02/20/2007   Followed as Primary Care Patient/ [facility]/ [facility]  Goal LDL < 130 HBP/Pos fm hx      . Hypertension    on 4 medications for HTN  . Hypokalemia 06/24/2012  . Hyponatremia 08/02/2014  . IBS (irritable bowel syndrome) 04/02/2011  . Measles as a child  . Mumps as a child  . Obesity   . Osteopenia   . Osteopenia after menopause   . Overactive bladder 04/02/2011  . Overweight   . Preventative health care 03/13/2012  . Shoulder pain, bilateral 06/14/2011  . Sleep apnea    uses cpap    Tobacco History: Social History   Tobacco Use  Smoking Status Never Smoker  Smokeless Tobacco Never Used   Counseling given: Not Answered   Outpatient Medications Prior to Visit  Medication Sig Dispense Refill  . amLODipine (NORVASC) 10 MG tablet TAKE 1/2 TABLET BY MOUTH TWICE A DAY 90 tablet 1  . aspirin EC 81 MG tablet Take 1 tablet (81 mg total) by mouth daily.    Marland Kitchen atorvastatin (LIPITOR) 10 MG tablet atorvastatin 10 mg tablet  TAKE 1 TABLET (10 MG TOTAL) BY MOUTH DAILY AT 6 PM.    . calcium carbonate (TUMS) 500 MG chewable tablet Chew 1 tablet by mouth as needed.     . cetirizine (ZYRTEC) 10 MG tablet Take 10 mg by mouth at bedtime as needed.      . cycloSPORINE (RESTASIS) 0.05 % ophthalmic emulsion Place 1 drop into  both eyes 2 (two) times daily.    . ergocalciferol (VITAMIN D2) 1.25 MG (50000 UT) capsule ergocalciferol (vitamin D2) 1,250 mcg (50,000 unit) capsule  TAKE 1 CAPSULE (50,000 UNITS TOTAL) BY MOUTH EVERY 7 (SEVEN) DAYS.    Marland Kitchen metoprolol succinate (TOPROL-XL) 50 MG 24 hr tablet TAKE 1 TABLET BY MOUTH TWICE A DAY WITH OR IMMEDIATELY FOLLOWING A MEAL 180 tablet 2  . Multiple Minerals-Vitamins (CITRACAL PLUS PO) Take 1 tablet by mouth daily.      Marland Kitchen SUTAB [phone] MG TABS Take 12 tablets by mouth as directed. 24 tablet 0  . triamterene-hydrochlorothiazide (MAXZIDE-25) 37.5-25 MG tablet TAKE 1 TABLET BY MOUTH EVERY DAY 90 tablet 1  . trospium  (SANCTURA) 20 MG tablet Take 20 mg by mouth daily.      . valsartan (DIOVAN) 320 MG tablet TAKE 1 TABLET BY MOUTH EVERY DAY 90 tablet 1  . famotidine (PEPCID) 40 MG tablet Take 1 tablet (40 mg total) by mouth at bedtime as needed for heartburn or indigestion. 90 tablet 1   No facility-administered medications prior to visit.     Review of Systems:   Constitutional:   No  weight loss, night sweats,  Fevers, chills, fatigue, or  lassitude.  HEENT:   No headaches,  Difficulty swallowing,  Tooth/dental problems, or  Sore throat,                No sneezing, itching, ear ache, nasal congestion, post nasal drip,   CV:  No chest pain,  Orthopnea, PND, swelling in lower extremities, anasarca, dizziness, palpitations, syncope.   GI  No heartburn, indigestion, abdominal pain, nausea, vomiting, diarrhea, change in bowel habits, loss of appetite, bloody stools.   Resp: No shortness of breath with exertion or at rest.  No excess mucus, no productive cough,  No non-productive cough,  No coughing up of blood.  No change in color of mucus.  No wheezing.  No chest wall deformity  Skin: no rash or lesions.  GU: no dysuria, change in color of urine, no urgency or frequency.  No flank pain, no hematuria   MS:  No joint pain or swelling.  No decreased range of motion.  No back pain.    Physical Exam  BP 122/70 (BP Location: Left Arm, Patient Position: Sitting, Cuff Size: Normal)   Pulse (!) 55   Temp 98.1 F (36.7 C) (Temporal)   Ht 5\' 5"  (1.651 m)   Wt 172 lb (78 kg)   SpO2 100%   BMI 28.62 kg/m   GEN: A/Ox3; pleasant , NAD, well nourished    HEENT:  [place]/AT,   , NOSE-clear, THROAT-clear, no lesions, no postnasal drip or exudate noted.   NECK:  Supple w/ fair ROM; no JVD; normal carotid impulses w/o bruits; no thyromegaly or nodules palpated; no lymphadenopathy.    RESP  Clear  P & A; w/o, wheezes/ rales/ or rhonchi. no accessory muscle use, no dullness to percussion  CARD:  RRR, no m/r/g,  no peripheral edema, pulses intact, no cyanosis or clubbing.  GI:   Soft & nt; nml bowel sounds; no organomegaly or masses detected.   Musco: Warm bil, no deformities or joint swelling noted.   Neuro: alert, no focal deficits noted.    Skin: Warm, no lesions or rashes    Lab Results:  BMET  BNP No results found for: BNP  ProBNP No results found for: PROBNP  Imaging: No results found.    No flowsheet data found.  No results found for: NITRICOXIDE      Assessment & Plan:   Sleep apnea Mild sleep apnea-patient is trying very hard to get used to CPAP.  Will adjust pressure setting for comfort.  Also advised on different mask.  She would like to try the DreamWear nasal mask.  She also has a CPAP appointment with her local DME to troubleshoot some issues as well.  Plan  Patient Instructions  Change CPAP pressure to 10 to 20 cm H20  CPAP download in 4 weeks .  Continue on CPAP  Trial of Dream Wear Nasal mask.  Goal is to wear all night long (for at least 4 or more hours sleep each night) .  Work on healthy weight .  Do not drive if sleepy  Follow up in 2 months and As needed           Rexene Edison, NP 07/28/2019

## 2019-07-28 NOTE — Patient Instructions (Signed)
Change CPAP pressure to 10 to 20 cm H20  CPAP download in 4 weeks .  Continue on CPAP  Trial of Dream Wear Nasal mask.  Goal is to wear all night long (for at least 4 or more hours sleep each night) .  Work on healthy weight .  Do not drive if sleepy  Follow up in 2 months and As needed

## 2019-07-28 NOTE — Assessment & Plan Note (Signed)
Mild sleep apnea-patient is trying very hard to get used to CPAP.  Will adjust pressure setting for comfort.  Also advised on different mask.  She would like to try the DreamWear nasal mask.  She also has a CPAP appointment with her local DME to troubleshoot some issues as well.  Plan  Patient Instructions  Change CPAP pressure to 10 to 20 cm H20  CPAP download in 4 weeks .  Continue on CPAP  Trial of Dream Wear Nasal mask.  Goal is to wear all night long (for at least 4 or more hours sleep each night) .  Work on healthy weight .  Do not drive if sleepy  Follow up in 2 months and As needed

## 2019-08-08 ENCOUNTER — Encounter: Payer: Self-pay | Admitting: Gastroenterology

## 2019-08-08 ENCOUNTER — Ambulatory Visit (AMBULATORY_SURGERY_CENTER): Payer: PPO | Admitting: Gastroenterology

## 2019-08-08 ENCOUNTER — Other Ambulatory Visit: Payer: Self-pay

## 2019-08-08 VITALS — BP 153/60 | HR 58 | Temp 97.1°F | Resp 20 | Ht 66.0 in | Wt 169.0 lb

## 2019-08-08 DIAGNOSIS — K621 Rectal polyp: Secondary | ICD-10-CM | POA: Diagnosis not present

## 2019-08-08 DIAGNOSIS — D123 Benign neoplasm of transverse colon: Secondary | ICD-10-CM

## 2019-08-08 DIAGNOSIS — D124 Benign neoplasm of descending colon: Secondary | ICD-10-CM

## 2019-08-08 DIAGNOSIS — Z1211 Encounter for screening for malignant neoplasm of colon: Secondary | ICD-10-CM | POA: Diagnosis not present

## 2019-08-08 DIAGNOSIS — K635 Polyp of colon: Secondary | ICD-10-CM

## 2019-08-08 DIAGNOSIS — D128 Benign neoplasm of rectum: Secondary | ICD-10-CM

## 2019-08-08 DIAGNOSIS — K641 Second degree hemorrhoids: Secondary | ICD-10-CM

## 2019-08-08 DIAGNOSIS — D122 Benign neoplasm of ascending colon: Secondary | ICD-10-CM

## 2019-08-08 DIAGNOSIS — K5730 Diverticulosis of large intestine without perforation or abscess without bleeding: Secondary | ICD-10-CM

## 2019-08-08 MED ORDER — SODIUM CHLORIDE 0.9 % IV SOLN: 500.0000 mL | INTRAVENOUS | Status: DC

## 2019-08-08 NOTE — Patient Instructions (Signed)
HANDOUTS PROVIDED ON: POLYPS, DIVERTICULOSIS, & HEMORRHOIDS  The polyps removed today have been sent for pathology.  The results can take 1-3 weeks to receive.  When your next colonoscopy should occur will be based on the pathology results.    You may resume your previous diet and medication schedule.  Use an OTC fiber, for example Citrucel, Fibercon, Konsyl, or Metamucil.  Thank you for allowing [place] to care for you today!!!   YOU HAD AN ENDOSCOPIC PROCEDURE TODAY AT [facility]:   Refer to the procedure report that was given to you for any specific questions about what was found during the examination.  If the procedure report does not answer your questions, please call your gastroenterologist to clarify.  If you requested that your care partner not be given the details of your procedure findings, then the procedure report has been included in a sealed envelope for you to review at your convenience later.  YOU SHOULD EXPECT: Some feelings of bloating in the abdomen. Passage of more gas than usual.  Walking can help get rid of the air that was put into your GI tract during the procedure and reduce the bloating. If you had a lower endoscopy (such as a colonoscopy or flexible sigmoidoscopy) you may notice spotting of blood in your stool or on the toilet paper. If you underwent a bowel prep for your procedure, you may not have a normal bowel movement for a few days.  Please Note:  You might notice some irritation and congestion in your nose or some drainage.  This is from the oxygen used during your procedure.  There is no need for concern and it should clear up in a day or so.  SYMPTOMS TO REPORT IMMEDIATELY:   Following lower endoscopy (colonoscopy or flexible sigmoidoscopy):  Excessive amounts of blood in the stool  Significant tenderness or worsening of abdominal pains  Swelling of the abdomen that is new, acute  Fever of 100F or higher  For urgent or emergent issues, a  gastroenterologist can be reached at any hour by calling [phone]. Do not use MyChart messaging for urgent concerns.    DIET:  We do recommend a small meal at first, but then you may proceed to your regular diet.  Drink plenty of fluids but you should avoid alcoholic beverages for 24 hours.  ACTIVITY:  You should plan to take it easy for the rest of today and you should NOT DRIVE or use heavy machinery until tomorrow (because of the sedation medicines used during the test).    FOLLOW UP: Our staff will call the number listed on your records 48-72 hours following your procedure to check on you and address any questions or concerns that you may have regarding the information given to you following your procedure. If we do not reach you, we will leave a message.  We will attempt to reach you two times.  During this call, we will ask if you have developed any symptoms of COVID 19. If you develop any symptoms (ie: fever, flu-like symptoms, shortness of breath, cough etc.) before then, please call [phone].  If you test positive for Covid 19 in the 2 weeks post procedure, please call and report this information to [place].    If any biopsies were taken you will be contacted by phone or by letter within the next 1-3 weeks.  Please call us at [phone] if you have not heard about the biopsies in 3 weeks.  SIGNATURES/CONFIDENTIALITY: You and/or your care partner have signed paperwork which will be entered into your electronic medical record.  These signatures attest to the fact that that the information above on your After Visit Summary has been reviewed and is understood.  Full responsibility of the confidentiality of this discharge information lies with you and/or your care-partner.

## 2019-08-08 NOTE — Progress Notes (Signed)
V/s DT I have reviewed the patient's medical history in detail and updated the computerized patient record. 

## 2019-08-08 NOTE — Progress Notes (Signed)
Called to room to assist during endoscopic procedure.  Patient ID and intended procedure confirmed with present staff. Received instructions for my participation in the procedure from the performing physician.  

## 2019-08-08 NOTE — Progress Notes (Signed)
A and O x3. Report to RN. Tolerated MAC anesthesia well.

## 2019-08-08 NOTE — Op Note (Signed)
[facility] Patient Name: Erika [name] Procedure Date: 08/08/2019 9:53 AM MRN: [id number] Endoscopist: Gerrit Heck , MD Age: 74 Referring MD:  Date of Birth: February 06, 1946 Gender: Female Account #: [id number] Procedure:                Colonoscopy Indications:              Screening for colorectal malignant neoplasm (last                            colonoscopy was 10 years ago) Medicines:                Monitored Anesthesia Care Procedure:                Pre-Anesthesia Assessment:                           - Prior to the procedure, a History and Physical                            was performed, and patient medications and                            allergies were reviewed. The patient's tolerance of                            previous anesthesia was also reviewed. The risks                            and benefits of the procedure and the sedation                            options and risks were discussed with the patient.                            All questions were answered, and informed consent                            was obtained. Prior Anticoagulants: The patient has                            taken no previous anticoagulant or antiplatelet                            agents. ASA Grade Assessment: II - A patient with                            mild systemic disease. After reviewing the risks                            and benefits, the patient was deemed in                            satisfactory condition to undergo the procedure.  After obtaining informed consent, the colonoscope                            was passed under direct vision. Throughout the                            procedure, the patient's blood pressure, pulse, and                            oxygen saturations were monitored continuously. The                            Colonoscope was introduced through the anus and                            advanced to the the terminal  ileum. The colonoscopy                            was performed without difficulty. The patient                            tolerated the procedure well. The quality of the                            bowel preparation was adequate. The terminal ileum,                            ileocecal valve, appendiceal orifice, and rectum                            were photographed. Scope In: 10:07:26 AM Scope Out: 10:35:31 AM Scope Withdrawal Time: 0 hours 25 minutes 10 seconds  Total Procedure Duration: 0 hours 28 minutes 5 seconds  Findings:                 Hemorrhoids were found on perianal exam.                           Four sessile polyps were found in the descending                            colon (2), transverse colon and ascending colon.                            The polyps were 3 to 6 mm in size. These polyps                            were removed with a cold snare. Resection and                            retrieval were complete. Estimated blood loss was                            minimal.  Two sessile polyps with adherent mucus cap were                            found in the rectum. The polyps were 3 to 4 mm in                            size. These polyps were removed with a cold snare.                            Resection and retrieval were complete. Estimated                            blood loss was minimal.                           A few small-mouthed diverticula were found in the                            sigmoid colon.                           Non-bleeding internal hemorrhoids were found during                            retroflexion. The hemorrhoids were medium-sized and                            Grade II (internal hemorrhoids that prolapse but                            reduce spontaneously).                           The terminal ileum appeared normal. Complications:            No immediate complications. Estimated Blood Loss:     Estimated  blood loss was minimal. Impression:               - Hemorrhoids found on perianal exam.                           - Four 3 to 6 mm polyps in the descending colon, in                            the transverse colon and in the ascending colon,                            removed with a cold snare. Resected and retrieved.                           - Two 3 to 4 mm polyps in the rectum, removed with                            a cold snare. Resected and retrieved.                           -  Diverticulosis in the sigmoid colon.                           - Non-bleeding internal hemorrhoids.                           - The examined portion of the ileum was normal. Recommendation:           - Patient has a contact number available for                            emergencies. The signs and symptoms of potential                            delayed complications were discussed with the                            patient. Return to normal activities tomorrow.                            Written discharge instructions were provided to the                            patient.                           - Resume previous diet.                           - Continue present medications.                           - Await pathology results.                           - Repeat colonoscopy in 3 - 5 years for                            surveillance based on pathology results.                           - Return to GI clinic PRN.                           - Use fiber, for example Citrucel, Fibercon, Konsyl                            or Metamucil.                           - Internal hemorrhoids were noted on this study and                            may be amenable to hemorrhoid band ligation. If you  are interested in further treatment of these                            hemorrhoids with band ligation, please contact my                            clinic to set up an appointment for evaluation  and                            treatment. Gerrit Heck, MD 08/08/2019 10:43:03 AM

## 2019-08-12 ENCOUNTER — Telehealth: Payer: Self-pay

## 2019-08-12 NOTE — Telephone Encounter (Signed)
°  Follow up Call-  Call back number 08/08/2019  Post procedure Call Back phone  # [phone]  Permission to leave phone message Yes  Some recent data might be hidden     Patient questions:  Do you have a fever, pain , or abdominal swelling? No. Pain Score  0 *  Have you tolerated food without any problems? Yes.    Have you been able to return to your normal activities? Yes.    Do you have any questions about your discharge instructions: Diet   No. Medications  No. Follow up visit  No.  Do you have questions or concerns about your Care? No.  Actions: * If pain score is 4 or above: No action needed, pain <4.  1. Have you developed a fever since your procedure? no  2.   Have you had an respiratory symptoms (SOB or cough) since your procedure? no  3.   Have you tested positive for COVID 19 since your procedure no  4.   Have you had any family members/close contacts diagnosed with the COVID 19 since your procedure?  no   If yes to any of these questions please route to Joylene John, RN and Erenest Rasher, RN

## 2019-08-17 ENCOUNTER — Other Ambulatory Visit: Payer: Self-pay | Admitting: Family Medicine

## 2019-08-17 DIAGNOSIS — G4733 Obstructive sleep apnea (adult) (pediatric): Secondary | ICD-10-CM | POA: Diagnosis not present

## 2019-08-18 ENCOUNTER — Encounter: Payer: Self-pay | Admitting: Gastroenterology

## 2019-08-28 DIAGNOSIS — N958 Other specified menopausal and perimenopausal disorders: Secondary | ICD-10-CM | POA: Diagnosis not present

## 2019-08-28 DIAGNOSIS — Z1231 Encounter for screening mammogram for malignant neoplasm of breast: Secondary | ICD-10-CM | POA: Diagnosis not present

## 2019-08-28 DIAGNOSIS — M8588 Other specified disorders of bone density and structure, other site: Secondary | ICD-10-CM | POA: Diagnosis not present

## 2019-08-31 ENCOUNTER — Other Ambulatory Visit: Payer: Self-pay | Admitting: Family Medicine

## 2019-09-06 ENCOUNTER — Other Ambulatory Visit: Payer: Self-pay | Admitting: Family Medicine

## 2019-09-16 DIAGNOSIS — G4733 Obstructive sleep apnea (adult) (pediatric): Secondary | ICD-10-CM | POA: Diagnosis not present

## 2019-09-20 ENCOUNTER — Other Ambulatory Visit: Payer: Self-pay | Admitting: Family Medicine

## 2019-10-24 ENCOUNTER — Encounter: Payer: Self-pay | Admitting: Adult Health

## 2019-10-24 ENCOUNTER — Ambulatory Visit (INDEPENDENT_AMBULATORY_CARE_PROVIDER_SITE_OTHER): Payer: PPO | Admitting: Adult Health

## 2019-10-24 ENCOUNTER — Other Ambulatory Visit: Payer: Self-pay

## 2019-10-24 DIAGNOSIS — G4733 Obstructive sleep apnea (adult) (pediatric): Secondary | ICD-10-CM

## 2019-10-24 DIAGNOSIS — E663 Overweight: Secondary | ICD-10-CM | POA: Diagnosis not present

## 2019-10-24 NOTE — Assessment & Plan Note (Signed)
Wt loss  

## 2019-10-24 NOTE — Assessment & Plan Note (Signed)
Improved control and compliance on nocturnal CPAP.  Will change to a nasal DreamWear mask.  Plan    Patient Instructions  Continue on CPAP  Trial of Dream Wear Nasal mask.  Goal is to wear all night long (for at least 4 or more hours sleep each night) .  Work on healthy weight .  Do not drive if sleepy  Follow up in 3-4  months and As needed

## 2019-10-24 NOTE — Progress Notes (Signed)
@Patient  ID: Erika [name], female    DOB: Jun 18, 1945, 74 y.o.   MRN: [id number]  Chief Complaint  Patient presents with  . Follow-up    OSA     Referring provider: [name], MD  HPI: 74 year old female followed for mild obstructive sleep apnea  TEST/EVENTS :  Home sleep study May 08, 2018 showed mild sleep apnea with AHI 11/hour  10/24/2019 Follow up : OSA  Patient presents for a 22-month follow-up.  Patient has mild sleep apnea has tried initially on oral appliance but was unable to tolerate and call some dental issues.  She has since been started on CPAP but has found it very uncomfortable was changed multiple masks.  Last visit was changed to a DreamWear full mask but is found that this is uncomfortable at times but is trying to wear it every single night.  She says it comes out a lot during her sleep.  CPAP download shows excellent compliance with 93% usage.  Daily average usage of 4.5 hours.  Patient had significant events last visit.  CPAP pressure was adjusted to 10 to 20 cm H2O.  She has much better control since last visit with AHI 0.5.  Daily average pressure at 11.7. We went over some different mask.  She would like to try the DreamWear nasal mask to see if this is more comfortable.  She was unable to tolerate nasal pillows in the past.   No Known Allergies  Immunization History  Administered Date(s) Administered  . Influenza Whole 12/05/2010, 12/04/2012  . Influenza, High Dose Seasonal PF 01/01/2015, 11/16/2016, 12/28/2017  . Influenza,inj,Quad PF,6+ Mos 01/12/2014  . Influenza-Unspecified 12/30/2014  . PFIZER SARS-COV-2 Vaccination 05/02/2019, 05/28/2019  . Pneumococcal Conjugate-13 01/13/2014  . Pneumococcal Polysaccharide-23 07/22/2008, 12/05/2010  . Td 03/06/2010  . Zoster 02/17/2014  . Zoster Recombinat (Shingrix) 06/19/2018, 10/20/2018    Past Medical History:  Diagnosis Date  . Allergic state 06/24/2012  . Anterior cervical lymphadenopathy  02/08/2013  . Arthritis 04/02/2011  . Bronchitis 04/02/2011  . Chicken pox as a child  . Diverticulosis of colon    colonoscopy 04/14/1999  . GERD (gastroesophageal reflux disease)   . History of fibrocystic disease of breast   . Hyperlipidemia, mixed 02/20/2007   Followed as Primary Care Patient/ [facility]/ [facility]  Goal LDL < 130 HBP/Pos fm hx      . Hypertension    on 4 medications for HTN  . Hypokalemia 06/24/2012  . Hyponatremia 08/02/2014  . IBS (irritable bowel syndrome) 04/02/2011  . Measles as a child  . Mumps as a child  . Obesity   . Osteopenia   . Osteopenia after menopause   . Overactive bladder 04/02/2011  . Overweight   . Preventative health care 03/13/2012  . Shoulder pain, bilateral 06/14/2011  . Sleep apnea    uses cpap    Tobacco History: Social History   Tobacco Use  Smoking Status Never Smoker  Smokeless Tobacco Never Used   Counseling given: Not Answered   Outpatient Medications Prior to Visit  Medication Sig Dispense Refill  . amLODipine (NORVASC) 10 MG tablet TAKE 1/2 TABLET BY MOUTH TWICE A DAY 90 tablet 1  . aspirin EC 81 MG tablet Take 1 tablet (81 mg total) by mouth daily.    Marland Kitchen atorvastatin (LIPITOR) 10 MG tablet Take 1 tablet (10 mg total) by mouth daily. 90 tablet 0  . calcium carbonate (TUMS) 500 MG chewable tablet Chew 1 tablet by mouth as needed.     Marland Kitchen  cetirizine (ZYRTEC) 10 MG tablet Take 10 mg by mouth at bedtime as needed.      . cycloSPORINE (RESTASIS) 0.05 % ophthalmic emulsion Place 1 drop into both eyes 2 (two) times daily.    . ergocalciferol (VITAMIN D2) 1.25 MG (50000 UT) capsule ergocalciferol (vitamin D2) 1,250 mcg (50,000 unit) capsule  TAKE 1 CAPSULE (50,000 UNITS TOTAL) BY MOUTH EVERY 7 (SEVEN) DAYS.    Marland Kitchen metoprolol succinate (TOPROL-XL) 50 MG 24 hr tablet TAKE 1 TABLET BY MOUTH TWICE A DAY WITH OR IMMEDIATELY FOLLOWING A MEAL 180 tablet 2  . Multiple Minerals-Vitamins (CITRACAL PLUS PO) Take 1 tablet by mouth daily.      Marland Kitchen  omeprazole (PRILOSEC) 20 MG capsule Take 20 mg by mouth 2 (two) times daily before a meal.     . SUTAB [phone] MG TABS Take 12 tablets by mouth as directed. 24 tablet 0  . trospium (SANCTURA) 20 MG tablet Take 20 mg by mouth daily.      . valsartan (DIOVAN) 320 MG tablet TAKE 1 TABLET BY MOUTH EVERY DAY 90 tablet 1  . triamterene-hydrochlorothiazide (MAXZIDE-25) 37.5-25 MG tablet TAKE 1 TABLET BY MOUTH EVERY DAY (Patient not taking: Reported on 10/24/2019) 90 tablet 1   No facility-administered medications prior to visit.     Review of Systems:   Constitutional:   No  weight loss, night sweats,  Fevers, chills, fatigue, or  lassitude.  HEENT:   No headaches,  Difficulty swallowing,  Tooth/dental problems, or  Sore throat,                No sneezing, itching, ear ache, nasal congestion, post nasal drip,   CV:  No chest pain,  Orthopnea, PND, swelling in lower extremities, anasarca, dizziness, palpitations, syncope.   GI  No heartburn, indigestion, abdominal pain, nausea, vomiting, diarrhea, change in bowel habits, loss of appetite, bloody stools.   Resp: No shortness of breath with exertion or at rest.  No excess mucus, no productive cough,  No non-productive cough,  No coughing up of blood.  No change in color of mucus.  No wheezing.  No chest wall deformity  Skin: no rash or lesions.  GU: no dysuria, change in color of urine, no urgency or frequency.  No flank pain, no hematuria   MS:  No joint pain or swelling.  No decreased range of motion.  No back pain.    Physical Exam  BP 134/66 (BP Location: Left Arm, Cuff Size: Normal)   Pulse 68   Temp 97.7 F (36.5 C) (Other (Comment)) Comment (Src): wrist  Ht 5\' 6"  (1.676 m)   Wt 168 lb (76.2 kg)   SpO2 100% Comment: Room air  BMI 27.12 kg/m   GEN: A/Ox3; pleasant , NAD, well nourished    HEENT:  [place]/AT,   NOSE-clear, THROAT-clear, no lesions, no postnasal drip or exudate noted.  Class II NP airway  NECK:  Supple w/ fair  ROM; no JVD; normal carotid impulses w/o bruits; no thyromegaly or nodules palpated; no lymphadenopathy.    RESP  Clear  P & A; w/o, wheezes/ rales/ or rhonchi. no accessory muscle use, no dullness to percussion  CARD:  RRR, no m/r/g, no peripheral edema, pulses intact, no cyanosis or clubbing.  GI:   Soft & nt; nml bowel sounds; no organomegaly or masses detected.   Musco: Warm bil, no deformities or joint swelling noted.   Neuro: alert, no focal deficits noted.    Skin: Warm, no lesions  or rashes    Lab Results:   BNP No results found for: BNP  ProBNP No results found for: PROBNP  Imaging: No results found.    No flowsheet data found.  No results found for: NITRICOXIDE      Assessment & Plan:   Sleep apnea Improved control and compliance on nocturnal CPAP.  Will change to a nasal DreamWear mask.  Plan    Patient Instructions  Continue on CPAP  Trial of Dream Wear Nasal mask.  Goal is to wear all night long (for at least 4 or more hours sleep each night) .  Work on healthy weight .  Do not drive if sleepy  Follow up in 3-4  months and As needed        Overweight Wt loss      Rexene Edison, NP 10/24/2019

## 2019-10-24 NOTE — Patient Instructions (Signed)
Continue on CPAP  Trial of Dream Wear Nasal mask.  Goal is to wear all night long (for at least 4 or more hours sleep each night) .  Work on healthy weight .  Do not drive if sleepy  Follow up in 3-4  months and As needed

## 2019-10-28 ENCOUNTER — Other Ambulatory Visit: Payer: Self-pay | Admitting: Family Medicine

## 2019-11-18 ENCOUNTER — Other Ambulatory Visit: Payer: Self-pay

## 2019-11-18 ENCOUNTER — Other Ambulatory Visit: Payer: Self-pay | Admitting: Family Medicine

## 2019-11-18 ENCOUNTER — Ambulatory Visit (INDEPENDENT_AMBULATORY_CARE_PROVIDER_SITE_OTHER): Payer: PPO | Admitting: Family Medicine

## 2019-11-18 DIAGNOSIS — I10 Essential (primary) hypertension: Secondary | ICD-10-CM | POA: Diagnosis not present

## 2019-11-18 DIAGNOSIS — R7989 Other specified abnormal findings of blood chemistry: Secondary | ICD-10-CM | POA: Diagnosis not present

## 2019-11-18 DIAGNOSIS — G3184 Mild cognitive impairment, so stated: Secondary | ICD-10-CM | POA: Diagnosis not present

## 2019-11-18 DIAGNOSIS — E782 Mixed hyperlipidemia: Secondary | ICD-10-CM | POA: Diagnosis not present

## 2019-11-18 DIAGNOSIS — M8580 Other specified disorders of bone density and structure, unspecified site: Secondary | ICD-10-CM | POA: Diagnosis not present

## 2019-11-18 LAB — COMPREHENSIVE METABOLIC PANEL
AG Ratio: 1.4 (calc) (ref 1.0–2.5)
ALT: 16 U/L (ref 6–29)
AST: 18 U/L (ref 10–35)
Albumin: 4.2 g/dL (ref 3.6–5.1)
Alkaline phosphatase (APISO): 75 U/L (ref 37–153)
BUN/Creatinine Ratio: 12 (calc) (ref 6–22)
BUN: 15 mg/dL (ref 7–25)
CO2: 32 mmol/L (ref 20–32)
Calcium: 10.1 mg/dL (ref 8.6–10.4)
Chloride: 95 mmol/L — ABNORMAL LOW (ref 98–110)
Creat: 1.23 mg/dL — ABNORMAL HIGH (ref 0.60–0.93)
Globulin: 3.1 g/dL (calc) (ref 1.9–3.7)
Glucose, Bld: 80 mg/dL (ref 65–99)
Potassium: 3.6 mmol/L (ref 3.5–5.3)
Sodium: 135 mmol/L (ref 135–146)
Total Bilirubin: 0.6 mg/dL (ref 0.2–1.2)
Total Protein: 7.3 g/dL (ref 6.1–8.1)

## 2019-11-18 LAB — CBC
HCT: 37.8 % (ref 35.0–45.0)
Hemoglobin: 12.4 g/dL (ref 11.7–15.5)
MCH: 27.6 pg (ref 27.0–33.0)
MCHC: 32.8 g/dL (ref 32.0–36.0)
MCV: 84 fL (ref 80.0–100.0)
MPV: 9.7 fL (ref 7.5–12.5)
Platelets: 370 10*3/uL (ref 140–400)
RBC: 4.5 10*6/uL (ref 3.80–5.10)
RDW: 13 % (ref 11.0–15.0)
WBC: 4.9 10*3/uL (ref 3.8–10.8)

## 2019-11-18 LAB — LIPID PANEL
Cholesterol: 202 mg/dL — ABNORMAL HIGH (ref <200)
HDL: 46 mg/dL — ABNORMAL LOW (ref >=50)
LDL Cholesterol (Calc): 128 mg/dL (calc) — ABNORMAL HIGH
Non-HDL Cholesterol (Calc): 156 mg/dL (calc) — ABNORMAL HIGH (ref <130)
Total CHOL/HDL Ratio: 4.4 (calc) (ref <5.0)
Triglycerides: 164 mg/dL — ABNORMAL HIGH (ref <150)

## 2019-11-18 LAB — TSH: TSH: 1.61 mIU/L (ref 0.40–4.50)

## 2019-11-18 LAB — VITAMIN D 25 HYDROXY (VIT D DEFICIENCY, FRACTURES): Vit D, 25-Hydroxy: 43 ng/mL (ref 30–100)

## 2019-11-18 NOTE — Patient Instructions (Addendum)
Tums as needed, can add Prilosec each morning as needed.  Check with [facility] and get this year's high flu shot  Take a multivitamin with minerals and some sort of fatty acid supplement such as fish or krill or flaxseed oil caps daily, frozen     Heartburn Heartburn is a type of pain or discomfort that can happen in the throat or chest. It is often described as a burning pain. It may also cause a bad, acid-like taste in the mouth. Heartburn may feel worse when you lie down or bend over, and it is often worse at night. Heartburn may be caused by stomach contents that move back up into the esophagus (reflux). Follow these instructions at home: Eating and drinking   Avoid certain foods and drinks as told by your health care provider. This may include: ? Coffee and tea (with or without caffeine). ? Drinks that contain alcohol. ? Energy drinks and sports drinks. ? Carbonated drinks or sodas. ? Chocolate and cocoa. ? Peppermint and mint flavorings. ? Garlic and onions. ? Horseradish. ? Spicy and acidic foods, including peppers, chili powder, curry powder, vinegar, hot sauces, and barbecue sauce. ? Citrus fruit juices and citrus fruits, such as oranges, lemons, and limes. ? Tomato-based foods, such as red sauce, chili, salsa, and pizza with red sauce. ? Fried and fatty foods, such as donuts, french fries, potato chips, and high-fat dressings. ? High-fat meats, such as hot dogs and fatty cuts of red and white meats, such as rib eye steak, sausage, ham, and bacon. ? High-fat dairy items, such as whole milk, butter, and cream cheese.  Eat small, frequent meals instead of large meals.  Avoid drinking large amounts of liquid with your meals.  Avoid eating meals during the 2-3 hours before bedtime.  Avoid lying down right after you eat.  Do not exercise right after you eat. Lifestyle      If you are overweight, reduce your weight to an amount that is healthy for you. Ask your health care  provider for guidance about a safe weight loss goal.  Do not use any products that contain nicotine or tobacco, such as cigarettes, e-cigarettes, and chewing tobacco. These can make your symptoms worse. If you need help quitting, ask your health care provider.  Wear loose-fitting clothing. Do not wear anything tight around your waist that causes pressure on your abdomen.  Raise (elevate) the head of your bed about 6 inches (15 cm) when you sleep.  Try to reduce your stress, such as with yoga or meditation. If you need help reducing stress, ask your health care provider. General instructions  Pay attention to any changes in your symptoms.  Take over-the-counter and prescription medicines only as told by your health care provider. ? Do not take aspirin, ibuprofen, or other NSAIDs unless your health care provider told you to do so. ? Stop medicines only as told by your health care provider. If you stop taking some medicines too quickly, your symptoms may get worse.  Keep all follow-up visits as told by your health care provider. This is important. Contact a health care provider if:  You have new symptoms.  You have unexplained weight loss.  You have difficulty swallowing, or it hurts to swallow.  You have wheezing or a persistent cough.  Your symptoms do not improve with treatment.  You have frequent heartburn for more than 2 weeks. Get help right away if:  You have pain in your arms, neck, jaw, teeth, or  back.  You feel sweaty, dizzy, or light-headed.  You have chest pain or shortness of breath.  You vomit and your vomit looks like blood or coffee grounds.  Your stool is bloody or black. These symptoms may represent a serious problem that is an emergency. Do not wait to see if the symptoms will go away. Get medical help right away. Call your local emergency services (911 in the [place] Do not drive yourself to the hospital. Summary  Heartburn is a type of pain or discomfort  that can happen in the throat or chest. It is often described as a burning pain. It may also cause a bad, acid-like taste in the mouth.  Avoid certain foods and drinks as told by your health care provider.  Take over-the-counter and prescription medicines only as told by your health care provider. Do not take aspirin, ibuprofen, or other NSAIDs unless your health care provider told you to do so.  Contact a health care provider if your symptoms do not improve or they get worse. This information is not intended to replace advice given to you by your health care provider. Make sure you discuss any questions you have with your health care provider. Document Revised: 07/23/2017 Document Reviewed: 07/23/2017 Elsevier Patient Education  [employer].

## 2019-11-18 NOTE — Assessment & Plan Note (Signed)
Well controlled, no changes to meds. Encouraged heart healthy diet such as the DASH diet and exercise as tolerated.  °

## 2019-11-18 NOTE — Assessment & Plan Note (Signed)
Encouraged heart healthy diet, increase exercise, avoid trans fats, consider a krill oil cap daily 

## 2019-11-18 NOTE — Assessment & Plan Note (Signed)
Supplement and monitor. She has not taken her high dose pill for about a month

## 2019-11-19 ENCOUNTER — Encounter: Payer: Self-pay | Admitting: *Deleted

## 2019-11-19 NOTE — Assessment & Plan Note (Signed)
She still struggles but it does not affect her ability to manage her ADLs. She has consider over the counter supplements but the cost has been prohibitive. She is encouraged to stay active eat a heart healthy diet and get plenty of rest. She is sleeping better.

## 2019-11-19 NOTE — Assessment & Plan Note (Signed)
Encouraged to get adequate exercise, calcium and vitamin d intake 

## 2019-11-19 NOTE — Progress Notes (Signed)
Subjective:    Patient ID: Erika [name], female    DOB: 1945/11/29, 74 y.o.   MRN: [id number]  Chief Complaint  Patient presents with  . Follow-up    21 WEEKS  . FALLEN ABOUT 1 WEEK AGO, concerns right lower leg area, some    HPI Patient is in today for follow up on chronic medical concerns. No recent febrile illness or hospitalizations. she did have a fall at home without syncope. No persistent injury but she has a tender bruise on anterior right shin and bruise on left thigh that are slowly improving. Otherwise she is doing well. 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 Denies CP/palp/SOB/HA/congestion/fevers/GI or GU c/o. Taking meds as prescribed  Past Medical History:  Diagnosis Date  . Allergic state 06/24/2012  . Anterior cervical lymphadenopathy 02/08/2013  . Arthritis 04/02/2011  . Bronchitis 04/02/2011  . Chicken pox as a child  . Diverticulosis of colon    colonoscopy 04/14/1999  . GERD (gastroesophageal reflux disease)   . History of fibrocystic disease of breast   . Hyperlipidemia, mixed 02/20/2007   Followed as Primary Care Patient/ [facility]/ [facility]  Goal LDL < 130 HBP/Pos fm hx      . Hypertension    on 4 medications for HTN  . Hypokalemia 06/24/2012  . Hyponatremia 08/02/2014  . IBS (irritable bowel syndrome) 04/02/2011  . Measles as a child  . Mumps as a child  . Obesity   . Osteopenia   . Osteopenia after menopause   . Overactive bladder 04/02/2011  . Overweight   . Preventative health care 03/13/2012  . Shoulder pain, bilateral 06/14/2011  . Sleep apnea    uses cpap    Past Surgical History:  Procedure Laterality Date  . BREAST REDUCTION SURGERY  1986  . BUNIONECTOMY Bilateral 1980  . CATARACT EXTRACTION, BILATERAL    . COLONOSCOPY  2011  . HAMMER TOE SURGERY Right 12-03-13   foot  . MOUTH SURGERY  01-10-13   negative  . TONSILLECTOMY    . UPPER GASTROINTESTINAL ENDOSCOPY  2016   [facility]    Family History  Problem  Relation Age of Onset  . Stroke Mother 73  . Hyperlipidemia Mother   . Hypertension Mother   . Heart attack Father 28  . Hypertension Father   . Heart disease Father   . Hypertension Brother   . Aneurysm Brother        aortic  . Hyperlipidemia Brother   . Heart disease Brother        aortic aneurysm rupture  . Multiple sclerosis Daughter   . Alcohol abuse Son   . Schizophrenia Son   . Bipolar disorder Son   . Mental illness Son        schizophrenic, bipolar  . Colon cancer Neg Hx   . Colon polyps Neg Hx   . Esophageal cancer Neg Hx   . Rectal cancer Neg Hx   . Stomach cancer Neg Hx     Social History   Socioeconomic History  . Marital status: Married    Spouse name: Not on file  . Number of children: 4  . Years of education: Not on file  . Highest education level: Not on file  Occupational History  . Occupation: dental hygenist  Tobacco Use  . Smoking status: Never Smoker  . Smokeless tobacco: Never Used  Vaping Use  . Vaping Use: Never used  Substance and Sexual Activity  . Alcohol  use: No  . Drug use: No  . Sexual activity: Not Currently  Other Topics Concern  . Not on file  Social History Narrative   4 children 2 are adopted   Social Determinants of Health   Financial Resource Strain:   . Difficulty of Paying Living Expenses: Not on file  Food Insecurity:   . Worried About Charity fundraiser in the Last Year: Not on file  . Ran Out of Food in the Last Year: Not on file  Transportation Needs:   . Lack of Transportation (Medical): Not on file  . Lack of Transportation (Non-Medical): Not on file  Physical Activity:   . Days of Exercise per Week: Not on file  . Minutes of Exercise per Session: Not on file  Stress:   . Feeling of Stress : Not on file  Social Connections:   . Frequency of Communication with Friends and Family: Not on file  . Frequency of Social Gatherings with Friends and Family: Not on file  . Attends Religious Services: Not on file    . Active Member of Clubs or Organizations: Not on file  . Attends Archivist Meetings: Not on file  . Marital Status: Not on file  Intimate Partner Violence:   . Fear of Current or Ex-Partner: Not on file  . Emotionally Abused: Not on file  . Physically Abused: Not on file  . Sexually Abused: Not on file    Outpatient Medications Prior to Visit  Medication Sig Dispense Refill  . amLODipine (NORVASC) 10 MG tablet TAKE 1/2 TABLET BY MOUTH TWICE A DAY 90 tablet 1  . aspirin EC 81 MG tablet Take 1 tablet (81 mg total) by mouth daily.    Marland Kitchen atorvastatin (LIPITOR) 10 MG tablet Take 1 tablet (10 mg total) by mouth daily. 90 tablet 0  . calcium carbonate (TUMS) 500 MG chewable tablet Chew 1 tablet by mouth as needed.     . cetirizine (ZYRTEC) 10 MG tablet Take 10 mg by mouth at bedtime as needed.      . cycloSPORINE (RESTASIS) 0.05 % ophthalmic emulsion Place 1 drop into both eyes 2 (two) times daily.    . famotidine (PEPCID) 40 MG tablet TAKE 1 TABLET (40 MG TOTAL) BY MOUTH AT BEDTIME AS NEEDED FOR HEARTBURN OR INDIGESTION. 90 tablet 1  . metoprolol succinate (TOPROL-XL) 50 MG 24 hr tablet TAKE 1 TABLET BY MOUTH TWICE A DAY WITH OR IMMEDIATELY FOLLOWING A MEAL 180 tablet 2  . Multiple Minerals-Vitamins (CITRACAL PLUS PO) Take 1 tablet by mouth daily.      Marland Kitchen SUTAB [phone] MG TABS Take 12 tablets by mouth as directed. 24 tablet 0  . trospium (SANCTURA) 20 MG tablet Take 20 mg by mouth daily.      . valsartan (DIOVAN) 320 MG tablet TAKE 1 TABLET BY MOUTH EVERY DAY 90 tablet 1  . ergocalciferol (VITAMIN D2) 1.25 MG (50000 UT) capsule ergocalciferol (vitamin D2) 1,250 mcg (50,000 unit) capsule  TAKE 1 CAPSULE (50,000 UNITS TOTAL) BY MOUTH EVERY 7 (SEVEN) DAYS.    Marland Kitchen omeprazole (PRILOSEC) 20 MG capsule Take 20 mg by mouth 2 (two) times daily before a meal.      No facility-administered medications prior to visit.    No Known Allergies  Review of Systems  Constitutional: Positive  for malaise/fatigue. Negative for fever.  HENT: Negative for congestion.   Eyes: Negative for blurred vision.  Respiratory: Negative for shortness of breath.   Cardiovascular: Negative  for chest pain, palpitations and leg swelling.  Gastrointestinal: Negative for abdominal pain, blood in stool and nausea.  Genitourinary: Negative for dysuria and frequency.  Musculoskeletal: Positive for falls.  Skin: Negative for rash.  Neurological: Negative for dizziness, loss of consciousness and headaches.  Endo/Heme/Allergies: Negative for environmental allergies.  Psychiatric/Behavioral: Positive for memory loss. Negative for depression. The patient is not nervous/anxious.        Objective:    Physical Exam Vitals and nursing note reviewed.  Constitutional:      General: She is not in acute distress.    Appearance: She is well-developed.  HENT:     Head: Normocephalic and atraumatic.     Nose: Nose normal.  Eyes:     General:        Right eye: No discharge.        Left eye: No discharge.  Cardiovascular:     Rate and Rhythm: Normal rate and regular rhythm.     Heart sounds: No murmur heard.   Pulmonary:     Effort: Pulmonary effort is normal.     Breath sounds: Normal breath sounds.  Abdominal:     General: Bowel sounds are normal.     Palpations: Abdomen is soft.     Tenderness: There is no abdominal tenderness.  Musculoskeletal:     Cervical back: Normal range of motion and neck supple.  Skin:    General: Skin is warm and dry.     Findings: Bruising present.     Comments: Bruising on anterior righ shin and left lateral thigh  Neurological:     Mental Status: She is alert and oriented to person, place, and time.     BP 129/68 (BP Location: Left Arm, Patient Position: Sitting, Cuff Size: Large)   Pulse (!) 58   Temp 97.9 F (36.6 C) (Oral)   Resp 13   Ht 5\' 6"  (1.676 m)   Wt 167 lb (75.8 kg)   SpO2 99%   BMI 26.95 kg/m  Wt Readings from Last 3 Encounters:  11/18/19  167 lb (75.8 kg)  10/24/19 168 lb (76.2 kg)  08/08/19 169 lb (76.7 kg)    Diabetic Foot Exam - Simple   No data filed     Lab Results  Component Value Date   WBC 4.9 11/18/2019   HGB 12.4 11/18/2019   HCT 37.8 11/18/2019   PLT 370 11/18/2019   GLUCOSE 80 11/18/2019   CHOL 202 (H) 11/18/2019   TRIG 164 (H) 11/18/2019   HDL 46 (L) 11/18/2019   LDLDIRECT 163.0 07/24/2014   LDLCALC 128 (H) 11/18/2019   ALT 16 11/18/2019   AST 18 11/18/2019   NA 135 11/18/2019   K 3.6 11/18/2019   CL 95 (L) 11/18/2019   CREATININE 1.23 (H) 11/18/2019   BUN 15 11/18/2019   CO2 32 11/18/2019   TSH 1.61 11/18/2019    Lab Results  Component Value Date   TSH 1.61 11/18/2019   Lab Results  Component Value Date   WBC 4.9 11/18/2019   HGB 12.4 11/18/2019   HCT 37.8 11/18/2019   MCV 84.0 11/18/2019   PLT 370 11/18/2019   Lab Results  Component Value Date   NA 135 11/18/2019   K 3.6 11/18/2019   CO2 32 11/18/2019   GLUCOSE 80 11/18/2019   BUN 15 11/18/2019   CREATININE 1.23 (H) 11/18/2019   BILITOT 0.6 11/18/2019   ALKPHOS 84 06/23/2019   AST 18 11/18/2019   ALT 16 11/18/2019  PROT 7.3 11/18/2019   ALBUMIN 4.3 06/23/2019   CALCIUM 10.1 11/18/2019   GFR 37.75 (L) 06/23/2019   Lab Results  Component Value Date   CHOL 202 (H) 11/18/2019   Lab Results  Component Value Date   HDL 46 (L) 11/18/2019   Lab Results  Component Value Date   LDLCALC 128 (H) 11/18/2019   Lab Results  Component Value Date   TRIG 164 (H) 11/18/2019   Lab Results  Component Value Date   CHOLHDL 4.4 11/18/2019   No results found for: HGBA1C     Assessment & Plan:   Problem List Items Addressed This Visit    Hyperlipidemia, mixed    Encouraged heart healthy diet, increase exercise, avoid trans fats, consider a krill oil cap daily      Relevant Orders   Lipid panel (Completed)   Hypertension    Well controlled, no changes to meds. Encouraged heart healthy diet such as the DASH diet and  exercise as tolerated.       Relevant Orders   CBC (Completed)   Comprehensive metabolic panel (Completed)   TSH (Completed)   Osteopenia    Encouraged to get adequate exercise, calcium and vitamin d intake      Low vitamin D level    Supplement and monitor. She has not taken her high dose pill for about a month      Relevant Orders   VITAMIN D 25 Hydroxy (Vit-D Deficiency, Fractures) (Completed)   Mild cognitive impairment    She still struggles but it does not affect her ability to manage her ADLs. She has consider over the counter supplements but the cost has been prohibitive. She is encouraged to stay active eat a heart healthy diet and get plenty of rest. She is sleeping better.          I have discontinued Ilyse H. Radle's ergocalciferol. I am also having her maintain her Multiple Minerals-Vitamins (CITRACAL PLUS PO), trospium, calcium carbonate, cetirizine, cycloSPORINE, valsartan, aspirin EC, metoprolol succinate, Sutab, amLODipine, atorvastatin, and famotidine.  No orders of the defined types were placed in this encounter.    Penni Homans, MD

## 2019-12-19 ENCOUNTER — Other Ambulatory Visit: Payer: Self-pay | Admitting: Family Medicine

## 2020-01-02 ENCOUNTER — Telehealth: Payer: Self-pay | Admitting: Family Medicine

## 2020-01-02 NOTE — Telephone Encounter (Signed)
Patient is calling in reference to her swelling in her feet and toes, pt would like Erika [name] to advise her on what to do, change.

## 2020-01-02 NOTE — Telephone Encounter (Signed)
Notify sometimes Amlodipine can cause swelling to drop the down to 1/2 a tab to just once a day. Also wear compression socks on in am off in pm, minimize simple carbs and send her in Furosemide 10 mg tabs, 1 tab po daily prn edema disp #30 and then check a cmp in a week to check on her kidneys and potassium, if she has muscle cramps or palpitations she should increase potassium in her diet and let us know and we can run labs sooner. Also monitor blood pressure and pulse and let us know numbers in about 2 weeks

## 2020-01-06 ENCOUNTER — Other Ambulatory Visit: Payer: Self-pay

## 2020-01-06 DIAGNOSIS — E871 Hypo-osmolality and hyponatremia: Secondary | ICD-10-CM

## 2020-01-06 MED ORDER — FUROSEMIDE 20 MG PO TABS: ORAL_TABLET | ORAL | 1 refills | Status: DC

## 2020-01-06 NOTE — Telephone Encounter (Signed)
Amlodipine can cause swelling to drop the down to 1/2 a tab to just once a day. Also wear compression socks on in am off in pm, minimize simple carbs and send her in Furosemide 10 mg tabs, 1 tab po daily prn

## 2020-01-06 NOTE — Telephone Encounter (Signed)
Spoke with pt given providers instruction. Will follow instruction and get labs done in two weeks

## 2020-01-17 DIAGNOSIS — G4733 Obstructive sleep apnea (adult) (pediatric): Secondary | ICD-10-CM | POA: Diagnosis not present

## 2020-01-22 NOTE — Progress Notes (Signed)
@Patient  ID: Erika [name], female    DOB: 1945-11-16, 74 y.o.   MRN: [id number]  Chief Complaint  Patient presents with  . Follow-up    Pt states she has been getting better as each day goes on. Pt has had improvements with the CPAP and states she has been able to get about 4 hours each night.    Referring provider: [name], MD  HPI: 75 year old female followed for mild obstructive sleep apnea  TEST/EVENTS :  Home sleep study May 08, 2018 showed mild sleep apnea with AHI 11/hour  Previous [facility] pulmonary encounter: 10/24/2019 Follow up : OSA  Patient presents for a 98-month follow-up.  Patient has mild sleep apnea has tried initially on oral appliance but was unable to tolerate and call some dental issues.  She has since been started on CPAP but has found it very uncomfortable was changed multiple masks.  Last visit was changed to a DreamWear full mask but is found that this is uncomfortable at times but is trying to wear it every single night.  She says it comes out a lot during her sleep.  CPAP download shows excellent compliance with 93% usage.  Daily average usage of 4.5 hours.  Patient had significant events last visit.  CPAP pressure was adjusted to 10 to 20 cm H2O.  She has much better control since last visit with AHI 0.5.  Daily average pressure at 11.7. We went over some different mask.  She would like to try the DreamWear nasal mask to see if this is more comfortable.  She was unable to tolerate nasal pillows in the past.  01/23/2020 Patient presents today for 3 month follow-up. She has been wearing her CPAP on average for 4-5 hours every night. She wakes up to use the restroom between 3-5am and has trouble falling back to sleep. She has been trying dreamwear nasal sling mask size medium which she does prefer. She has increased residual events, AHI 11.8. She sleeps with her mouth open. Discussed adding chin strap to help with this. She has additional concerns of ankle  swelling and reports losing her words during conversation. She is being followed by her PCP, she was recently seen in September and was noted to have mild cognitive impairment.    Airview download 12/23/19-01/21/20 Usage 30/30 days; 100% > 4 hours Pressure 10-20cm h20 (17.2cm h20- 95%) Airleaks 28.6L/min (95%) AHI 11.8   No Known Allergies  Immunization History  Administered Date(s) Administered  . Influenza Whole 12/05/2010, 12/04/2012  . Influenza, High Dose Seasonal PF 01/01/2015, 11/16/2016, 12/28/2017, 11/05/2019  . Influenza,inj,Quad PF,6+ Mos 01/12/2014  . Influenza-Unspecified 12/30/2014  . PFIZER SARS-COV-2 Vaccination 05/02/2019, 05/28/2019  . Pneumococcal Conjugate-13 01/13/2014  . Pneumococcal Polysaccharide-23 07/22/2008, 12/05/2010  . Td 03/06/2010  . Zoster 02/17/2014  . Zoster Recombinat (Shingrix) 06/19/2018, 10/20/2018    Past Medical History:  Diagnosis Date  . Allergic state 06/24/2012  . Anterior cervical lymphadenopathy 02/08/2013  . Arthritis 04/02/2011  . Bronchitis 04/02/2011  . Chicken pox as a child  . Diverticulosis of colon    colonoscopy 04/14/1999  . GERD (gastroesophageal reflux disease)   . History of fibrocystic disease of breast   . Hyperlipidemia, mixed 02/20/2007   Followed as Primary Care Patient/ [facility]/ [facility]  Goal LDL < 130 HBP/Pos fm hx      . Hypertension    on 4 medications for HTN  . Hypokalemia 06/24/2012  . Hyponatremia 08/02/2014  . IBS (irritable bowel syndrome) 04/02/2011  .  Measles as a child  . Mumps as a child  . Obesity   . Osteopenia   . Osteopenia after menopause   . Overactive bladder 04/02/2011  . Overweight   . Preventative health care 03/13/2012  . Shoulder pain, bilateral 06/14/2011  . Sleep apnea    uses cpap    Tobacco History: Social History   Tobacco Use  Smoking Status Never Smoker  Smokeless Tobacco Never Used   Counseling given: Not Answered   Outpatient Medications Prior to Visit    Medication Sig Dispense Refill  . amLODipine (NORVASC) 10 MG tablet TAKE 1/2 TABLET BY MOUTH TWICE A DAY 90 tablet 1  . aspirin EC 81 MG tablet Take 1 tablet (81 mg total) by mouth daily.    Marland Kitchen atorvastatin (LIPITOR) 10 MG tablet TAKE 1 TABLET BY MOUTH EVERY DAY 90 tablet 0  . calcium carbonate (TUMS) 500 MG chewable tablet Chew 1 tablet by mouth as needed.     . cetirizine (ZYRTEC) 10 MG tablet Take 10 mg by mouth at bedtime as needed.      . famotidine (PEPCID) 40 MG tablet TAKE 1 TABLET (40 MG TOTAL) BY MOUTH AT BEDTIME AS NEEDED FOR HEARTBURN OR INDIGESTION. 90 tablet 1  . furosemide (LASIX) 20 MG tablet Take 1/2 tab daily as needed for edema (swelling) 30 tablet 1  . metoprolol succinate (TOPROL-XL) 50 MG 24 hr tablet TAKE 1 TABLET BY MOUTH TWICE A DAY WITH OR IMMEDIATELY FOLLOWING A MEAL 180 tablet 2  . Multiple Minerals-Vitamins (CITRACAL PLUS PO) Take 1 tablet by mouth daily.      Marland Kitchen omeprazole (PRILOSEC) 20 MG capsule TAKE 1 CAPSULE BY MOUTH EVERY DAY 90 capsule 2  . trospium (SANCTURA) 20 MG tablet Take 20 mg by mouth daily.      . valsartan (DIOVAN) 320 MG tablet TAKE 1 TABLET BY MOUTH EVERY DAY 90 tablet 1  . SUTAB [id number] MG TABS Take 12 tablets by mouth as directed. (Patient not taking: Reported on 01/23/2020) 24 tablet 0  . cycloSPORINE (RESTASIS) 0.05 % ophthalmic emulsion Place 1 drop into both eyes 2 (two) times daily.     No facility-administered medications prior to visit.   Review of Systems  Review of Systems  Constitutional: Negative.   HENT: Negative.   Respiratory: Negative.   Cardiovascular: Positive for leg swelling.  Neurological: Positive for speech difficulty.   Physical Exam  BP 132/64 (BP Location: Left Arm, Cuff Size: Normal)   Pulse 62   Temp 98.3 F (36.8 C) (Other (Comment)) Comment (Src): wrist  Ht 5\' 6"  (1.676 m)   Wt 163 lb (73.9 kg)   SpO2 100%   BMI 26.31 kg/m  Physical Exam Constitutional:      Appearance: Normal appearance.   HENT:     Head: Normocephalic and atraumatic.     Mouth/Throat:     Mouth: Mucous membranes are moist.     Pharynx: Oropharynx is clear.  Cardiovascular:     Rate and Rhythm: Normal rate and regular rhythm.     Comments: RRR Pulmonary:     Effort: Pulmonary effort is normal.     Breath sounds: Normal breath sounds.     Comments: CTA Musculoskeletal:     Cervical back: Normal range of motion and neck supple.  Skin:    General: Skin is warm and dry.  Neurological:     General: No focal deficit present.     Mental Status: She is alert and oriented to  person, place, and time. Mental status is at baseline.     Comments: Neuros intact  Psychiatric:        Mood and Affect: Mood normal.        Behavior: Behavior normal.        Thought Content: Thought content normal.        Judgment: Judgment normal.      Lab Results:  CBC    Component Value Date/Time   WBC 4.9 11/18/2019 0932   RBC 4.50 11/18/2019 0932   HGB 12.4 11/18/2019 0932   HCT 37.8 11/18/2019 0932   PLT 370 11/18/2019 0932   MCV 84.0 11/18/2019 0932   MCH 27.6 11/18/2019 0932   MCHC 32.8 11/18/2019 0932   RDW 13.0 11/18/2019 0932   LYMPHSABS 2.0 10/30/2017 1009   MONOABS 0.7 10/30/2017 1009   EOSABS 0.3 10/30/2017 1009   BASOSABS 0.1 10/30/2017 1009    BMET    Component Value Date/Time   NA 135 11/18/2019 0932   K 3.6 11/18/2019 0932   CL 95 (L) 11/18/2019 0932   CO2 32 11/18/2019 0932   GLUCOSE 80 11/18/2019 0932   BUN 15 11/18/2019 0932   CREATININE 1.23 (H) 11/18/2019 0932   CALCIUM 10.1 11/18/2019 0932   GFRNONAA 38 (L) 02/14/2016 1012   GFRAA 44 (L) 02/14/2016 1012    BNP No results found for: BNP  ProBNP No results found for: PROBNP  Imaging: No results found.   Assessment & Plan:   Sleep apnea - HST in March 2020 showed mild OSA, AHI 11/hr. She is 100% compliant with CPAP use, wearing on average 5 hours each night.  Pressure 10-20cm h20; Residual AHI 11.8. She changed to nasal  sling mask which she prefers, she does sleep with her mouth open which is why her events are probably higher. She was well controlled during last visit on current pressure settings. Recommend adding chin strap. No pressure changes today. Follow-up in 3 months    Ankle swelling - Trace bilateral ankle swelling. Takes 10mg  lasix as needed. Encourage patient try wearing compression stockings. If not improvement recommend follow-up with PCP who is managing.     Mild cognitive impairment - Patient described difficulty word findings at times during conversation. Her neuro exam was grossly intact today. Advised her to speak with PCP about potential neurology referral.    Martyn Ehrich, NP 01/23/2020

## 2020-01-23 ENCOUNTER — Other Ambulatory Visit: Payer: Self-pay

## 2020-01-23 ENCOUNTER — Ambulatory Visit (INDEPENDENT_AMBULATORY_CARE_PROVIDER_SITE_OTHER): Payer: PPO | Admitting: Primary Care

## 2020-01-23 ENCOUNTER — Encounter: Payer: Self-pay | Admitting: Primary Care

## 2020-01-23 ENCOUNTER — Ambulatory Visit: Payer: PPO | Admitting: Adult Health

## 2020-01-23 VITALS — BP 132/64 | HR 62 | Temp 98.3°F | Ht 66.0 in | Wt 163.0 lb

## 2020-01-23 DIAGNOSIS — G3184 Mild cognitive impairment, so stated: Secondary | ICD-10-CM

## 2020-01-23 DIAGNOSIS — M25473 Effusion, unspecified ankle: Secondary | ICD-10-CM | POA: Diagnosis not present

## 2020-01-23 DIAGNOSIS — G4733 Obstructive sleep apnea (adult) (pediatric): Secondary | ICD-10-CM | POA: Diagnosis not present

## 2020-01-23 NOTE — Assessment & Plan Note (Signed)
-   HST in March 2020 showed mild OSA, AHI 11/hr. She is 100% compliant with CPAP use, wearing on average 5 hours each night.  Pressure 10-20cm h20; Residual AHI 11.8. She changed to nasal sling mask which she prefers, she does sleep with her mouth open which is why her events are probably higher. She was well controlled during last visit on current pressure settings. Recommend adding chin strap. No pressure changes today. Follow-up in 3 months

## 2020-01-23 NOTE — Patient Instructions (Addendum)
Recommendations: - Limit fluids after dinner - Continue to wear CPAP every night, aim to get 4-6 hours a night  - Try getting compression stockings for ankle edema - Discuss referral to neurology with Dr. Charlett Blake   Order: - Dreamwear nasal sling mask  - [employer]ocare  - Chin strap   Follow-up: - 3 months with Tammy    CPAP and BPAP Information CPAP and BPAP are methods of helping a person breathe with the use of air pressure. CPAP stands for "continuous positive airway pressure." BPAP stands for "bi-level positive airway pressure." In both methods, air is blown through your nose or mouth and into your air passages to help you breathe well. CPAP and BPAP use different amounts of pressure to blow air. With CPAP, the amount of pressure stays the same while you breathe in and out. With BPAP, the amount of pressure is increased when you breathe in (inhale) so that you can take larger breaths. Your health care provider will recommend whether CPAP or BPAP would be more helpful for you. Why are CPAP and BPAP treatments used? CPAP or BPAP can be helpful if you have:  Sleep apnea.  Chronic obstructive pulmonary disease (COPD).  Heart failure.  Medical conditions that weaken the muscles of the chest including muscular dystrophy, or neurological diseases such as amyotrophic lateral sclerosis (ALS).  Other problems that cause breathing to be weak, abnormal, or difficult. CPAP is most commonly used for obstructive sleep apnea (OSA) to keep the airways from collapsing when the muscles relax during sleep. How is CPAP or BPAP administered? Both CPAP and BPAP are provided by a small machine with a flexible plastic tube that attaches to a plastic mask. You wear the mask. Air is blown through the mask into your nose or mouth. The amount of pressure that is used to blow the air can be adjusted on the machine. Your health care provider will determine the pressure setting that should be used based on your  individual needs. When should CPAP or BPAP be used? In most cases, the mask only needs to be worn during sleep. Generally, the mask needs to be worn throughout the night and during any daytime naps. People with certain medical conditions may also need to wear the mask at other times when they are awake. Follow instructions from your health care provider about when to use the machine. What are some tips for using the mask?   Because the mask needs to be snug, some people feel trapped or closed-in (claustrophobic) when first using the mask. If you feel this way, you may need to get used to the mask. One way to do this is by holding the mask loosely over your nose or mouth and then gradually applying the mask more snugly. You can also gradually increase the amount of time that you use the mask.  Masks are available in various types and sizes. Some fit over your mouth and nose while others fit over just your nose. If your mask does not fit well, talk with your health care provider about getting a different one.  If you are using a mask that fits over your nose and you tend to breathe through your mouth, a chin strap may be applied to help keep your mouth closed.  The CPAP and BPAP machines have alarms that may sound if the mask comes off or develops a leak.  If you have trouble with the mask, it is very important that you talk with your health  care provider about finding a way to make the mask easier to tolerate. Do not stop using the mask. Stopping the use of the mask could have a negative impact on your health. What are some tips for using the machine?  Place your CPAP or BPAP machine on a secure table or stand near an electrical outlet.  Know where the on/off switch is located on the machine.  Follow instructions from your health care provider about how to set the pressure on your machine and when you should use it.  Do not eat or drink while the CPAP or BPAP machine is on. Food or fluids could  get pushed into your lungs by the pressure of the CPAP or BPAP.  Do not smoke. Tobacco smoke residue can damage the machine.  For home use, CPAP and BPAP machines can be rented or purchased through home health care companies. Many different brands of machines are available. Renting a machine before purchasing may help you find out which particular machine works well for you.  Keep the CPAP or BPAP machine and attachments clean. Ask your health care provider for specific instructions. Get help right away if:  You have redness or open areas around your nose or mouth where the mask fits.  You have trouble using the CPAP or BPAP machine.  You cannot tolerate wearing the CPAP or BPAP mask.  You have pain, discomfort, and bloating in your abdomen. Summary  CPAP and BPAP are methods of helping a person breathe with the use of air pressure.  Both CPAP and BPAP are provided by a small machine with a flexible plastic tube that attaches to a plastic mask.  If you have trouble with the mask, it is very important that you talk with your health care provider about finding a way to make the mask easier to tolerate. This information is not intended to replace advice given to you by your health care provider. Make sure you discuss any questions you have with your health care provider. Document Revised: 06/12/2018 Document Reviewed: 01/10/2016 Elsevier Patient Education  [employer].

## 2020-01-23 NOTE — Assessment & Plan Note (Signed)
-   Trace bilateral ankle swelling. Takes 10mg  lasix as needed. Encourage patient try wearing compression stockings. If not improvement recommend follow-up with PCP who is managing.

## 2020-01-23 NOTE — Assessment & Plan Note (Signed)
-   Patient described difficulty word findings at times during conversation. Her neuro exam was grossly intact today. Advised her to speak with PCP about potential neurology referral.

## 2020-01-26 DIAGNOSIS — G4733 Obstructive sleep apnea (adult) (pediatric): Secondary | ICD-10-CM | POA: Diagnosis not present

## 2020-02-04 ENCOUNTER — Other Ambulatory Visit: Payer: Self-pay | Admitting: Family Medicine

## 2020-02-14 ENCOUNTER — Other Ambulatory Visit: Payer: Self-pay | Admitting: Family Medicine

## 2020-02-16 DIAGNOSIS — G4733 Obstructive sleep apnea (adult) (pediatric): Secondary | ICD-10-CM | POA: Diagnosis not present

## 2020-02-26 ENCOUNTER — Other Ambulatory Visit: Payer: Self-pay | Admitting: Family Medicine

## 2020-03-02 DIAGNOSIS — G4733 Obstructive sleep apnea (adult) (pediatric): Secondary | ICD-10-CM | POA: Diagnosis not present

## 2020-03-14 ENCOUNTER — Other Ambulatory Visit: Payer: Self-pay | Admitting: Family Medicine

## 2020-03-14 DIAGNOSIS — E871 Hypo-osmolality and hyponatremia: Secondary | ICD-10-CM

## 2020-03-18 DIAGNOSIS — G4733 Obstructive sleep apnea (adult) (pediatric): Secondary | ICD-10-CM | POA: Diagnosis not present

## 2020-04-18 DIAGNOSIS — G4733 Obstructive sleep apnea (adult) (pediatric): Secondary | ICD-10-CM | POA: Diagnosis not present

## 2020-04-20 ENCOUNTER — Other Ambulatory Visit: Payer: Self-pay | Admitting: Family Medicine

## 2020-04-22 DIAGNOSIS — L570 Actinic keratosis: Secondary | ICD-10-CM | POA: Diagnosis not present

## 2020-04-22 DIAGNOSIS — L918 Other hypertrophic disorders of the skin: Secondary | ICD-10-CM | POA: Diagnosis not present

## 2020-04-22 DIAGNOSIS — D224 Melanocytic nevi of scalp and neck: Secondary | ICD-10-CM | POA: Diagnosis not present

## 2020-04-22 DIAGNOSIS — L720 Epidermal cyst: Secondary | ICD-10-CM | POA: Diagnosis not present

## 2020-04-22 DIAGNOSIS — L821 Other seborrheic keratosis: Secondary | ICD-10-CM | POA: Diagnosis not present

## 2020-05-02 ENCOUNTER — Other Ambulatory Visit: Payer: Self-pay | Admitting: Family Medicine

## 2020-05-06 ENCOUNTER — Other Ambulatory Visit: Payer: Self-pay

## 2020-05-06 ENCOUNTER — Emergency Department (HOSPITAL_BASED_OUTPATIENT_CLINIC_OR_DEPARTMENT_OTHER)
Admission: EM | Admit: 2020-05-06 | Discharge: 2020-05-06 | Disposition: A | Discharge status: Home / Self Care | Payer: PPO | Attending: Emergency Medicine | Admitting: Emergency Medicine

## 2020-05-06 ENCOUNTER — Encounter (HOSPITAL_BASED_OUTPATIENT_CLINIC_OR_DEPARTMENT_OTHER): Payer: Self-pay | Admitting: Emergency Medicine

## 2020-05-06 ENCOUNTER — Ambulatory Visit: Payer: PPO | Admitting: Adult Health

## 2020-05-06 ENCOUNTER — Emergency Department (HOSPITAL_BASED_OUTPATIENT_CLINIC_OR_DEPARTMENT_OTHER): Payer: PPO

## 2020-05-06 DIAGNOSIS — I10 Essential (primary) hypertension: Secondary | ICD-10-CM | POA: Diagnosis not present

## 2020-05-06 DIAGNOSIS — S46911A Strain of unspecified muscle, fascia and tendon at shoulder and upper arm level, right arm, initial encounter: Secondary | ICD-10-CM | POA: Diagnosis not present

## 2020-05-06 DIAGNOSIS — M79601 Pain in right arm: Secondary | ICD-10-CM | POA: Diagnosis not present

## 2020-05-06 DIAGNOSIS — M25511 Pain in right shoulder: Secondary | ICD-10-CM | POA: Diagnosis not present

## 2020-05-06 DIAGNOSIS — X58XXXA Exposure to other specified factors, initial encounter: Secondary | ICD-10-CM | POA: Insufficient documentation

## 2020-05-06 DIAGNOSIS — R001 Bradycardia, unspecified: Secondary | ICD-10-CM | POA: Diagnosis not present

## 2020-05-06 DIAGNOSIS — R079 Chest pain, unspecified: Secondary | ICD-10-CM | POA: Diagnosis not present

## 2020-05-06 DIAGNOSIS — Z7982 Long term (current) use of aspirin: Secondary | ICD-10-CM | POA: Diagnosis not present

## 2020-05-06 DIAGNOSIS — K449 Diaphragmatic hernia without obstruction or gangrene: Secondary | ICD-10-CM | POA: Diagnosis not present

## 2020-05-06 DIAGNOSIS — M25521 Pain in right elbow: Secondary | ICD-10-CM | POA: Diagnosis not present

## 2020-05-06 DIAGNOSIS — Z79899 Other long term (current) drug therapy: Secondary | ICD-10-CM | POA: Diagnosis not present

## 2020-05-06 DIAGNOSIS — S4991XA Unspecified injury of right shoulder and upper arm, initial encounter: Secondary | ICD-10-CM | POA: Diagnosis present

## 2020-05-06 LAB — COMPREHENSIVE METABOLIC PANEL
ALT: 17 U/L (ref 0–44)
AST: 22 U/L (ref 15–41)
Albumin: 4 g/dL (ref 3.5–5.0)
Alkaline Phosphatase: 85 U/L (ref 38–126)
Anion gap: 9 (ref 5–15)
BUN: 14 mg/dL (ref 8–23)
CO2: 24 mmol/L (ref 22–32)
Calcium: 8.9 mg/dL (ref 8.9–10.3)
Chloride: 103 mmol/L (ref 98–111)
Creatinine, Ser: 1.2 mg/dL — ABNORMAL HIGH (ref 0.44–1.00)
GFR, Estimated: 48 mL/min — ABNORMAL LOW (ref >60)
Glucose, Bld: 98 mg/dL (ref 70–99)
Potassium: 3.4 mmol/L — ABNORMAL LOW (ref 3.5–5.1)
Sodium: 136 mmol/L (ref 135–145)
Total Bilirubin: 0.5 mg/dL (ref 0.3–1.2)
Total Protein: 7.7 g/dL (ref 6.5–8.1)

## 2020-05-06 LAB — CBC WITH DIFFERENTIAL/PLATELET
Abs Immature Granulocytes: 0.01 10*3/uL (ref 0.00–0.07)
Basophils Absolute: 0.1 10*3/uL (ref 0.0–0.1)
Basophils Relative: 1 %
Eosinophils Absolute: 0.2 10*3/uL (ref 0.0–0.5)
Eosinophils Relative: 3 %
HCT: 38.2 % (ref 36.0–46.0)
Hemoglobin: 12.2 g/dL (ref 12.0–15.0)
Immature Granulocytes: 0 %
Lymphocytes Relative: 28 %
Lymphs Abs: 2 10*3/uL (ref 0.7–4.0)
MCH: 26.8 pg (ref 26.0–34.0)
MCHC: 31.9 g/dL (ref 30.0–36.0)
MCV: 83.8 fL (ref 80.0–100.0)
Monocytes Absolute: 0.6 10*3/uL (ref 0.1–1.0)
Monocytes Relative: 9 %
Neutro Abs: 4.2 10*3/uL (ref 1.7–7.7)
Neutrophils Relative %: 59 %
Platelets: 333 10*3/uL (ref 150–400)
RBC: 4.56 MIL/uL (ref 3.87–5.11)
RDW: 13.6 % (ref 11.5–15.5)
WBC: 7.1 10*3/uL (ref 4.0–10.5)
nRBC: 0 % (ref 0.0–0.2)

## 2020-05-06 LAB — TROPONIN I (HIGH SENSITIVITY): Troponin I (High Sensitivity): 5 ng/L (ref <18)

## 2020-05-06 MED ORDER — POTASSIUM CHLORIDE CRYS ER 20 MEQ PO TBCR
40.0000 meq | EXTENDED_RELEASE_TABLET | Freq: Once | ORAL | Status: AC
  Administered 2020-05-06: 40 meq via ORAL
  Filled 2020-05-06: qty 2

## 2020-05-06 MED ORDER — ACETAMINOPHEN 325 MG PO TABS
650.0000 mg | ORAL_TABLET | Freq: Once | ORAL | Status: AC
  Administered 2020-05-06: 650 mg via ORAL
  Filled 2020-05-06: qty 2

## 2020-05-06 NOTE — ED Notes (Signed)
Pt reports she and spouse been cleaning up fallen tree x 1 week. Pt reports pain started at R shoulder, radiated to hand over past week. Taking tylenol with relief. Izora Gala. RN

## 2020-05-06 NOTE — ED Triage Notes (Signed)
Rt sided chest pain and arm, shoulder  pain x 2 days and  Left side hurting also  No n/v/sob, no injury

## 2020-05-06 NOTE — Discharge Instructions (Signed)
If you develop recurrent, continued, or worsening chest pain, shortness of breath, fever, vomiting, abdominal or back pain, or any other new/concerning symptoms then return to the ER for evaluation.  

## 2020-05-06 NOTE — ED Provider Notes (Signed)
[place] EMERGENCY DEPARTMENT Provider Note   CSN: [id number] Arrival date & time: 05/06/20  1000     History Chief Complaint  Patient presents with  . Chest Pain    Erika [name] is a 75 y.o. female.  HPI 75 year old female presents with a chief complaint of right shoulder/arm pain and chest pain.  Started 2 days ago.  Worse with movement of her arm.  She has some chronic left-sided shoulder issues and recently had a down tree that she was helping to get rid of.  Thinks she favored the right side causing some pain now.  No shortness of breath, cough, fever.  No numbness.  Tylenol has helped with the pain temporarily. No direct trauma. No pleuritic pain.   Past Medical History:  Diagnosis Date  . Allergic state 06/24/2012  . Anterior cervical lymphadenopathy 02/08/2013  . Arthritis 04/02/2011  . Bronchitis 04/02/2011  . Chicken pox as a child  . Diverticulosis of colon    colonoscopy 04/14/1999  . GERD (gastroesophageal reflux disease)   . History of fibrocystic disease of breast   . Hyperlipidemia, mixed 02/20/2007   Followed as Primary Care Patient/ [facility]/ [facility]  Goal LDL < 130 HBP/Pos fm hx      . Hypertension    on 4 medications for HTN  . Hypokalemia 06/24/2012  . Hyponatremia 08/02/2014  . IBS (irritable bowel syndrome) 04/02/2011  . Measles as a child  . Mumps as a child  . Obesity   . Osteopenia   . Osteopenia after menopause   . Overactive bladder 04/02/2011  . Overweight   . Preventative health care 03/13/2012  . Shoulder pain, bilateral 06/14/2011  . Sleep apnea    uses cpap    Patient Active Problem List   Diagnosis Date Noted  . Ankle swelling 01/23/2020  . Mild cognitive impairment 12/24/2018  . Headache 02/11/2018  . 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 Thoracic back pain 07/31/2017  . Neck pain on right side 07/31/2017  . Tremor 05/23/2017  . Left hip pain 05/23/2017  . Sleep apnea 05/23/2017  . Acute bilateral knee pain  05/15/2016  . Low vitamin D level 01/09/2016  . Hyponatremia 08/02/2014  . Fatigue 08/02/2014  . Cough 01/19/2014  . Atypical chest pain 01/19/2014  . Allergic state 06/24/2012  . Hypokalemia 06/24/2012  . Dry mouth 03/13/2012  . Preventative health care 03/13/2012  . Shoulder pain, bilateral 06/14/2011  . Arthritis 04/02/2011  . IBS (irritable bowel syndrome) 04/02/2011  . Overactive bladder 04/02/2011  . Hypertension   . Diverticulosis of colon   . Overweight   . Osteopenia   . History of fibrocystic disease of breast   . CHRONIC RHINITIS 01/13/2009  . Hyperlipidemia, mixed 02/20/2007  . GERD 02/20/2007    Past Surgical History:  Procedure Laterality Date  . BREAST REDUCTION SURGERY  1986  . BUNIONECTOMY Bilateral 1980  . CATARACT EXTRACTION, BILATERAL    . COLONOSCOPY  2011  . HAMMER TOE SURGERY Right 12-03-13   foot  . MOUTH SURGERY  01-10-13   negative  . TONSILLECTOMY    . UPPER GASTROINTESTINAL ENDOSCOPY  2016   RK     OB History   No obstetric history on file.     Family History  Problem Relation Age of Onset  . Stroke Mother 60  . Hyperlipidemia Mother   . Hypertension Mother   . Heart attack Father 42  . Hypertension Father   . Heart disease  Father   . Hypertension Brother   . Aneurysm Brother        aortic  . Hyperlipidemia Brother   . Heart disease Brother        aortic aneurysm rupture  . Multiple sclerosis Daughter   . Alcohol abuse Son   . Schizophrenia Son   . Bipolar disorder Son   . Mental illness Son        schizophrenic, bipolar  . Colon cancer Neg Hx   . Colon polyps Neg Hx   . Esophageal cancer Neg Hx   . Rectal cancer Neg Hx   . Stomach cancer Neg Hx     Social History   Tobacco Use  . Smoking status: Never Smoker  . Smokeless tobacco: Never Used  Vaping Use  . Vaping Use: Never used  Substance Use Topics  . Alcohol use: No  . Drug use: No    Home Medications Prior to Admission medications   Medication Sig Start  Date End Date Taking? Authorizing Provider  amLODipine (NORVASC) 10 MG tablet Take 0.5 tablets (5 mg total) by mouth 2 (two) times daily. 02/26/20   [name], MD  aspirin EC 81 MG tablet Take 1 tablet (81 mg total) by mouth daily. 04/28/19   [name], MD  atorvastatin (LIPITOR) 10 MG tablet TAKE 1 TABLET BY MOUTH EVERY DAY 12/20/19   [name], MD  calcium carbonate (TUMS) 500 MG chewable tablet Chew 1 tablet by mouth as needed.     [provider]  cetirizine (ZYRTEC) 10 MG tablet Take 10 mg by mouth at bedtime as needed.      [provider]  famotidine (PEPCID) 40 MG tablet Take 1 tablet (40 mg total) by mouth at bedtime as needed for heartburn or indigestion. 04/20/20   [name], MD  furosemide (LASIX) 20 MG tablet Take 0.5 tablets (10 mg total) by mouth daily as needed for edema. 03/15/20   [name], MD  metoprolol succinate (TOPROL-XL) 50 MG 24 hr tablet TAKE 1 TABLET BY MOUTH TWICE A DAY WITH OR IMMEDIATELY FOLLOWING A MEAL 07/08/19   [name], MD  Multiple Minerals-Vitamins (CITRACAL PLUS PO) Take 1 tablet by mouth daily.      [provider]  omeprazole (PRILOSEC) 20 MG capsule TAKE 1 CAPSULE BY MOUTH EVERY DAY 11/18/19   [name], MD  SUTAB [phone] MG TABS Take 12 tablets by mouth as directed. Patient not taking: Reported on 01/23/2020 07/25/19   Cirigliano, Vito V, DO  trospium (SANCTURA) 20 MG tablet Take 20 mg by mouth daily.      [provider]  valsartan (DIOVAN) 320 MG tablet Take 1 tablet (320 mg total) by mouth daily. 05/03/20   [name], MD    Allergies    Patient has no known allergies.  Review of Systems   Review of Systems  Constitutional: Negative for fever.  Respiratory: Negative for cough and shortness of breath.   Cardiovascular: Positive for chest pain.  Gastrointestinal: Negative for abdominal pain.  Musculoskeletal: Positive for arthralgias.  All other systems reviewed and  are negative.   Physical Exam Updated Vital Signs BP (!) 180/61   Pulse 60   Temp 98.1 F (36.7 C)   Resp 16   Ht 5\' 5"  (1.651 m)   Wt 70.3 kg   SpO2 100%   BMI 25.79 kg/m   Physical Exam Vitals and nursing note reviewed.  Constitutional:  Appearance: She is well-developed and well-nourished.  HENT:     Head: Normocephalic and atraumatic.     Right Ear: External ear normal.     Left Ear: External ear normal.     Nose: Nose normal.  Eyes:     General:        Right eye: No discharge.        Left eye: No discharge.  Cardiovascular:     Rate and Rhythm: Normal rate and regular rhythm.     Pulses:          Radial pulses are 2+ on the right side and 2+ on the left side.     Heart sounds: Normal heart sounds.  Pulmonary:     Effort: Pulmonary effort is normal.     Breath sounds: Normal breath sounds.  Chest:     Chest wall: No tenderness.  Abdominal:     Palpations: Abdomen is soft.     Tenderness: There is no abdominal tenderness.  Musculoskeletal:     Right shoulder: Tenderness present. Normal range of motion.     Left shoulder: No tenderness. Normal range of motion.     Right upper arm: No tenderness.     Right elbow: No swelling. Normal range of motion. Tenderness present.  Skin:    General: Skin is warm and dry.  Neurological:     Mental Status: She is alert.  Psychiatric:        Mood and Affect: Mood is not anxious.     ED Results / Procedures / Treatments   Labs (all labs ordered are listed, but only abnormal results are displayed) Labs Reviewed  COMPREHENSIVE METABOLIC PANEL - Abnormal; Notable for the following components:      Result Value   Potassium 3.4 (*)    Creatinine, Ser 1.20 (*)    GFR, Estimated 48 (*)    All other components within normal limits  CBC WITH DIFFERENTIAL/PLATELET  TROPONIN I (HIGH SENSITIVITY)  TROPONIN I (HIGH SENSITIVITY)    EKG EKG Interpretation  Date/Time:  Thursday May 06 2020 10:05:30 EST Ventricular  Rate:  56 PR Interval:  156 QRS Duration: 96 QT Interval:  444 QTC Calculation: 428 R Axis:   68 Text Interpretation: Sinus bradycardia Nonspecific ST abnormality No old tracing to compare Confirmed by Sherwood Gambler [phone]) on 05/06/2020 10:24:21 AM   Radiology DG Chest 2 View  Result Date: 05/06/2020 CLINICAL DATA:  Right shoulder and arm pain since moving a fallen tree 1 week ago EXAM: CHEST - 2 VIEW COMPARISON:  07/22/2008 FINDINGS: Normal sized heart. Interval moderate-sized hiatal hernia. Clear lungs with normal vascularity. Mild thoracic spine degenerative changes. No fracture or pneumothorax seen. IMPRESSION: 1. No acute abnormality. 2. Interval moderate-sized hiatal hernia. Electronically Signed   By: Claudie Revering M.D.   On: 05/06/2020 11:43   DG Shoulder Right  Result Date: 05/06/2020 CLINICAL DATA:  Right shoulder pain since moving a fallen tree. EXAM: RIGHT SHOULDER - 2+ VIEW COMPARISON:  06/28/2018 FINDINGS: There is no evidence of fracture or dislocation. There is no evidence of arthropathy or other focal bone abnormality. Soft tissues are unremarkable. IMPRESSION: Normal examination. Electronically Signed   By: Claudie Revering M.D.   On: 05/06/2020 11:40   DG Elbow Complete Right  Result Date: 05/06/2020 CLINICAL DATA:  Right shoulder and arm pain since moving a fall tree 1 week ago. EXAM: RIGHT ELBOW - COMPLETE 3+ VIEW COMPARISON:  None. FINDINGS: There is no evidence of fracture, dislocation,  or joint effusion. There is no evidence of arthropathy or other focal bone abnormality. Soft tissues are unremarkable. IMPRESSION: Negative. Electronically Signed   By: Claudie Revering M.D.   On: 05/06/2020 11:41    Procedures Procedures   Medications Ordered in ED Medications - No data to display  ED Course  I have reviewed the triage vital signs and the nursing notes.  Pertinent labs & imaging results that were available during my care of the patient were reviewed by me and considered in  my medical decision making (see chart for details).    MDM Rules/Calculators/A&P                          Patient appears to primarily have a right shoulder strain. She declines any type of medications in the ED. Because it also involves her chest, a chest xray, labs and ECG would obtained. ECG is non-specific, but I favor is chronic/non-ischemic given length of time of symptoms (a couple days), as well as the muscular nature. Troponin is negative after multiple days of symptoms. Doubt PE/dissection.  Will recommend shoulder range of motion exercises and continue to use Tylenol and follow-up with PCP. Return if worsening. Final Clinical Impression(s) / ED Diagnoses Final diagnoses:  Strain of right shoulder, initial encounter    Rx / DC Orders ED Discharge Orders    None       Sherwood Gambler, MD 05/06/20 1231

## 2020-05-13 ENCOUNTER — Encounter: Payer: Self-pay | Admitting: Adult Health

## 2020-05-13 ENCOUNTER — Other Ambulatory Visit: Payer: Self-pay

## 2020-05-13 ENCOUNTER — Ambulatory Visit (INDEPENDENT_AMBULATORY_CARE_PROVIDER_SITE_OTHER): Payer: PPO | Admitting: Adult Health

## 2020-05-13 DIAGNOSIS — G4733 Obstructive sleep apnea (adult) (pediatric): Secondary | ICD-10-CM

## 2020-05-13 DIAGNOSIS — G3184 Mild cognitive impairment, so stated: Secondary | ICD-10-CM | POA: Diagnosis not present

## 2020-05-13 DIAGNOSIS — E663 Overweight: Secondary | ICD-10-CM

## 2020-05-13 NOTE — Patient Instructions (Signed)
Continue on CPAP  Goal is to wear all night long (for at least 4 or more hours sleep each night) .  Work on healthy weight .  Do not drive if sleepy  Follow up in 1 year in Dr. Elsworth Soho  and As needed

## 2020-05-13 NOTE — Assessment & Plan Note (Signed)
Excellent control and compliance on nocturnal CPAP.  Keep up the good work  Plan  Patient Instructions  Continue on CPAP  Goal is to wear all night long (for at least 4 or more hours sleep each night) .  Work on healthy weight .  Do not drive if sleepy  Follow up in 1 year in Dr. Elsworth Soho  and As needed

## 2020-05-13 NOTE — Assessment & Plan Note (Addendum)
Patient has a history of cognitive impairment.  Have asked her to discuss with primary care next week at follow-up visit as she feels that this is getting worse.  Exam is unrevealing for acute process.

## 2020-05-13 NOTE — Progress Notes (Signed)
@Patient  ID: Erika [name], female    DOB: [birth year], 75 y.o.   MRN: [id number]  Chief Complaint  Patient presents with  . Follow-up    Referring provider: [name], MD  HPI: 75 year old female followed for mild obstructive sleep apnea   TEST/EVENTS :  Home sleep study May 08, 2018 showed mild sleep apnea with AHI 11/hour  05/13/2020 Follow up : OSA  Patient returns for a 6-month follow-up.  Patient has underlying mild sleep apnea.  She is on nocturnal CPAP.  She had initially tried an oral appliance but did not tolerate.  She had some trouble finding the right mass.  She is currently found the DreamWear nasal mask and finds that this works very well.  She is wearing her CPAP every single night.  Patient says she feels it is helping and she feels more rested.  The CPAP download shows excellent compliance with daily average usage at 7 hours.  AHI is 2.2.  Minimal leaks.  CPAP daily average at 13 cm H2O.  Patient says she is has some memory issues and finds it hard at times to find the correct word.  This appears to be have been going on for some time.  Have talked to her about discussing this further with her primary care provider at her follow-up visit next week.  She denies any severe headaches extremity weakness trouble swallowing or visual changes.  Recently seen in the emergency room after an accident with yard work.  Currently in a sling.  No Known Allergies  Immunization History  Administered Date(s) Administered  . Influenza Whole 12/05/2010, 12/04/2012  . Influenza, High Dose Seasonal PF 01/01/2015, 11/16/2016, 12/28/2017, 11/05/2019  . Influenza,inj,Quad PF,6+ Mos 01/12/2014  . Influenza-Unspecified 12/30/2014  . PFIZER(Purple Top)SARS-COV-2 Vaccination 05/02/2019, 05/28/2019  . Pneumococcal Conjugate-13 01/13/2014  . Pneumococcal Polysaccharide-23 07/22/2008, 12/05/2010  . Td 03/06/2010  . Zoster 02/17/2014  . Zoster Recombinat (Shingrix) 06/19/2018,  10/20/2018    Past Medical History:  Diagnosis Date  . Allergic state 06/24/2012  . Anterior cervical lymphadenopathy 02/08/2013  . Arthritis 04/02/2011  . Bronchitis 04/02/2011  . Chicken pox as a child  . Diverticulosis of colon    colonoscopy 04/14/1999  . GERD (gastroesophageal reflux disease)   . History of fibrocystic disease of breast   . Hyperlipidemia, mixed 02/20/2007   Followed as Primary Care Patient/ [facility]/ [facility]  Goal LDL < 130 HBP/Pos fm hx      . Hypertension    on 4 medications for HTN  . Hypokalemia 06/24/2012  . Hyponatremia 08/02/2014  . IBS (irritable bowel syndrome) 04/02/2011  . Measles as a child  . Mumps as a child  . Obesity   . Osteopenia   . Osteopenia after menopause   . Overactive bladder 04/02/2011  . Overweight   . Preventative health care 03/13/2012  . Shoulder pain, bilateral 06/14/2011  . Sleep apnea    uses cpap    Tobacco History: Social History   Tobacco Use  Smoking Status Never Smoker  Smokeless Tobacco Never Used   Counseling given: Not Answered   Outpatient Medications Prior to Visit  Medication Sig Dispense Refill  . amLODipine (NORVASC) 10 MG tablet Take 0.5 tablets (5 mg total) by mouth 2 (two) times daily. 90 tablet 1  . aspirin EC 81 MG tablet Take 1 tablet (81 mg total) by mouth daily.    Marland Kitchen atorvastatin (LIPITOR) 10 MG tablet TAKE 1 TABLET BY MOUTH EVERY DAY 90 tablet 0  .  calcium carbonate (TUMS - DOSED IN MG ELEMENTAL CALCIUM) 500 MG chewable tablet Chew 1 tablet by mouth as needed.    . cetirizine (ZYRTEC) 10 MG tablet Take 10 mg by mouth at bedtime as needed.    . famotidine (PEPCID) 40 MG tablet Take 1 tablet (40 mg total) by mouth at bedtime as needed for heartburn or indigestion. 90 tablet 3  . furosemide (LASIX) 20 MG tablet Take 0.5 tablets (10 mg total) by mouth daily as needed for edema. 45 tablet 1  . metoprolol succinate (TOPROL-XL) 50 MG 24 hr tablet TAKE 1 TABLET BY MOUTH TWICE A DAY WITH OR IMMEDIATELY  FOLLOWING A MEAL 180 tablet 2  . Multiple Minerals-Vitamins (CITRACAL PLUS PO) Take 1 tablet by mouth daily.    Marland Kitchen omeprazole (PRILOSEC) 20 MG capsule TAKE 1 CAPSULE BY MOUTH EVERY DAY 90 capsule 2  . trospium (SANCTURA) 20 MG tablet Take 20 mg by mouth daily.    . valsartan (DIOVAN) 320 MG tablet Take 1 tablet (320 mg total) by mouth daily. 90 tablet 1  . SUTAB [phone] MG TABS Take 12 tablets by mouth as directed. (Patient not taking: No sig reported) 24 tablet 0   No facility-administered medications prior to visit.     Review of Systems:   Constitutional:   No  weight loss, night sweats,  Fevers, chills,  +fatigue, or  lassitude.  HEENT:   No headaches,  Difficulty swallowing,  Tooth/dental problems, or  Sore throat,                No sneezing, itching, ear ache, nasal congestion, post nasal drip,   CV:  No chest pain,  Orthopnea, PND, swelling in lower extremities, anasarca, dizziness, palpitations, syncope.   GI  No heartburn, indigestion, abdominal pain, nausea, vomiting, diarrhea, change in bowel habits, loss of appetite, bloody stools.   Resp: No shortness of breath with exertion or at rest.  No excess mucus, no productive cough,  No non-productive cough,  No coughing up of blood.  No change in color of mucus.  No wheezing.  No chest wall deformity  Skin: no rash or lesions.  GU: no dysuria, change in color of urine, no urgency or frequency.  No flank pain, no hematuria   MS:  No joint pain or swelling.  No decreased range of motion.  No back pain.  Right arm sling with shoulder strain    Physical Exam  BP (!) 150/64 (BP Location: Left Arm, Patient Position: Sitting, Cuff Size: Normal)   Pulse 64   Temp 97.9 F (36.6 C) (Temporal)   Ht 5\' 6"  (1.676 m)   Wt 170 lb (77.1 kg)   SpO2 98%   BMI 27.44 kg/m   GEN: A/Ox3; pleasant , NAD, well nourished    HEENT:  [place]/AT,   NOSE-clear, THROAT-clear, no lesions, no postnasal drip or exudate noted.  Class II-III MP  airway  NECK:  Supple w/ fair ROM; no JVD; normal carotid impulses w/o bruits; no thyromegaly or nodules palpated; no lymphadenopathy.    RESP  Clear  P & A; w/o, wheezes/ rales/ or rhonchi. no accessory muscle use, no dullness to percussion  CARD:  RRR, no m/r/g, no peripheral edema, pulses intact, no cyanosis or clubbing.  GI:   Soft & nt; nml bowel sounds; no organomegaly or masses detected.   Musco: Warm bil, no deformities or joint swelling noted.  Right shoulder sling  Neuro: alert, no focal deficits noted.  Skin: Warm, no lesions or rashes    Lab Results:   BNP No results found for: BNP  ProBNP No results found for: PROBNP  Imaging:     No flowsheet data found.  No results found for: NITRICOXIDE      Assessment & Plan:   Sleep apnea Excellent control and compliance on nocturnal CPAP.  Keep up the good work  Plan  Patient Instructions  Continue on CPAP  Goal is to wear all night long (for at least 4 or more hours sleep each night) .  Work on healthy weight .  Do not drive if sleepy  Follow up in 1 year in Dr. Elsworth Soho  and As needed         Overweight Healthy weight loss     Rexene Edison, NP 05/13/2020

## 2020-05-13 NOTE — Addendum Note (Signed)
Addended by: Vanessa Barbara on: 05/13/2020 05:01 PM   Modules accepted: Orders

## 2020-05-13 NOTE — Assessment & Plan Note (Signed)
Healthy weight loss 

## 2020-05-16 DIAGNOSIS — G4733 Obstructive sleep apnea (adult) (pediatric): Secondary | ICD-10-CM | POA: Diagnosis not present

## 2020-05-20 ENCOUNTER — Other Ambulatory Visit: Payer: Self-pay

## 2020-05-20 ENCOUNTER — Encounter: Payer: Self-pay | Admitting: Family Medicine

## 2020-05-20 ENCOUNTER — Ambulatory Visit (INDEPENDENT_AMBULATORY_CARE_PROVIDER_SITE_OTHER): Payer: PPO | Admitting: Family Medicine

## 2020-05-20 VITALS — BP 200/90 | HR 56 | Resp 18 | Ht 66.0 in | Wt 166.0 lb

## 2020-05-20 DIAGNOSIS — L578 Other skin changes due to chronic exposure to nonionizing radiation: Secondary | ICD-10-CM | POA: Diagnosis not present

## 2020-05-20 DIAGNOSIS — E782 Mixed hyperlipidemia: Secondary | ICD-10-CM

## 2020-05-20 DIAGNOSIS — Z1239 Encounter for other screening for malignant neoplasm of breast: Secondary | ICD-10-CM

## 2020-05-20 DIAGNOSIS — M8580 Other specified disorders of bone density and structure, unspecified site: Secondary | ICD-10-CM | POA: Diagnosis not present

## 2020-05-20 DIAGNOSIS — I10 Essential (primary) hypertension: Secondary | ICD-10-CM | POA: Diagnosis not present

## 2020-05-20 DIAGNOSIS — E876 Hypokalemia: Secondary | ICD-10-CM | POA: Diagnosis not present

## 2020-05-20 DIAGNOSIS — R079 Chest pain, unspecified: Secondary | ICD-10-CM

## 2020-05-20 DIAGNOSIS — Z0000 Encounter for general adult medical examination without abnormal findings: Secondary | ICD-10-CM

## 2020-05-20 DIAGNOSIS — G3184 Mild cognitive impairment, so stated: Secondary | ICD-10-CM

## 2020-05-20 DIAGNOSIS — G4733 Obstructive sleep apnea (adult) (pediatric): Secondary | ICD-10-CM

## 2020-05-20 DIAGNOSIS — R600 Localized edema: Secondary | ICD-10-CM

## 2020-05-20 DIAGNOSIS — R7989 Other specified abnormal findings of blood chemistry: Secondary | ICD-10-CM

## 2020-05-20 DIAGNOSIS — I059 Rheumatic mitral valve disease, unspecified: Secondary | ICD-10-CM

## 2020-05-20 HISTORY — DX: Localized edema: R60.0

## 2020-05-20 LAB — LIPID PANEL
Cholesterol: 204 mg/dL — ABNORMAL HIGH (ref 0–200)
HDL: 46.1 mg/dL (ref >39.00)
LDL Cholesterol: 133 mg/dL — ABNORMAL HIGH (ref 0–99)
NonHDL: 157.46
Total CHOL/HDL Ratio: 4
Triglycerides: 124 mg/dL (ref 0.0–149.0)
VLDL: 24.8 mg/dL (ref 0.0–40.0)

## 2020-05-20 LAB — CBC
HCT: 35.7 % — ABNORMAL LOW (ref 36.0–46.0)
Hemoglobin: 11.8 g/dL — ABNORMAL LOW (ref 12.0–15.0)
MCHC: 33.1 g/dL (ref 30.0–36.0)
MCV: 81.9 fl (ref 78.0–100.0)
Platelets: 356 10*3/uL (ref 150.0–400.0)
RBC: 4.36 Mil/uL (ref 3.87–5.11)
RDW: 15.1 % (ref 11.5–15.5)
WBC: 5.8 10*3/uL (ref 4.0–10.5)

## 2020-05-20 LAB — COMPREHENSIVE METABOLIC PANEL
ALT: 13 U/L (ref 0–35)
AST: 15 U/L (ref 0–37)
Albumin: 4.2 g/dL (ref 3.5–5.2)
Alkaline Phosphatase: 102 U/L (ref 39–117)
BUN: 14 mg/dL (ref 6–23)
CO2: 26 mEq/L (ref 19–32)
Calcium: 9.7 mg/dL (ref 8.4–10.5)
Chloride: 103 mEq/L (ref 96–112)
Creatinine, Ser: 0.94 mg/dL (ref 0.40–1.20)
GFR: 59.84 mL/min — ABNORMAL LOW (ref >60.00)
Glucose, Bld: 87 mg/dL (ref 70–99)
Potassium: 3.7 mEq/L (ref 3.5–5.1)
Sodium: 139 mEq/L (ref 135–145)
Total Bilirubin: 0.6 mg/dL (ref 0.2–1.2)
Total Protein: 7.4 g/dL (ref 6.0–8.3)

## 2020-05-20 LAB — TSH: TSH: 0.75 u[IU]/mL (ref 0.35–4.50)

## 2020-05-20 NOTE — Assessment & Plan Note (Signed)
Wearing her CPAP and sleeping much better

## 2020-05-20 NOTE — Patient Instructions (Addendum)
Topical lidocaine and aspercreme for the right shoulder pain  Consider neuropsychiatric testing to evaluate memory let me know when you are ready for referral  Need a tetanus shot especially if you get an injury or a scratch  Preventive Care 75 Years and Older, Female Preventive care refers to lifestyle choices and visits with your health care provider that can promote health and wellness. This includes: Older, Female Preventive care refers to lifestyle choices and visits with your health care provider that can promote health and wellness. This includes:  A yearly physical exam. This is also called an annual wellness visit.  Regular dental and eye exams.  Immunizations.  Screening for certain conditions.  Healthy lifestyle choices, such as: ? Eating a healthy diet. ? Getting regular exercise. ? Not using drugs or products that contain nicotine and tobacco. ? Limiting alcohol use. What can I expect for my preventive care visit? Physical exam Your health care provider will check your:  Height and weight. These may be used to calculate your BMI (body mass index). BMI is a measurement that tells if you are at a healthy weight.  Heart rate and blood pressure.  Body temperature.  Skin for abnormal spots. Counseling Your health care provider may ask you questions about your:  Past medical problems.  Family's medical history.  Alcohol, tobacco, and drug use.  Emotional well-being.  Home life and relationship well-being.  Sexual activity.  Diet, exercise, and sleep habits.  History of falls.  Memory and ability to understand (cognition).  Work and work Statistician.  Pregnancy and menstrual history.  Access to firearms. What immunizations do I need? Vaccines are usually given at various ages, according to a schedule. Your health care provider will recommend vaccines for you based on your age, medical history, and lifestyle or other factors, such as travel or where you work.   What tests do I need? Blood tests  Lipid and cholesterol levels. These may be checked every 5 years, or more often depending on your  overall health.  Hepatitis C test.  Hepatitis B test. Screening  Lung cancer screening. You may have this screening every year starting at age 75 if you have a 30-pack-year history of smoking and currently smoke or have quit within the past 15 years. have a 30-pack-year history of smoking and currently smoke or have quit within the past 15 years.  Colorectal cancer screening. ? All adults should have this screening starting at age 75 and continuing until age 61. ? Your health care provider may recommend screening at age 75 if you are at increased risk. ? You will have tests every 1-10 years, depending on your results and the type of screening test. until age 61. ? Your health care provider may recommend screening at age 75 if you are at increased risk. ? You will have tests every 1-10 years, depending on your results and the type of screening test.  Diabetes screening. ? This is done by checking your blood sugar (glucose) after you have not eaten for a while (fasting). ? You may have this done every 1-3 years.  Mammogram. ? This may be done every 1-2 years. ? Talk with your health care provider about how often you should have regular mammograms.  Abdominal aortic aneurysm (AAA) screening. You may need this if you are a current or former smoker.  BRCA-related cancer screening. This may be done if you have a family history of breast, ovarian, tubal, or peritoneal cancers. Other tests  STD (sexually transmitted disease) testing, if you are at risk.  Bone density scan. This is done to screen for osteoporosis. You may have this done starting at age 52. Talk with your health care provider about your test results, treatment options, and if necessary, the need for more tests. Follow these instructions at home: Eating and drinking  Eat a diet that includes fresh fruits and vegetables, whole grains, lean protein, and low-fat dairy products. Limit your intake  of foods with high amounts of sugar, saturated fats, and salt.  Take vitamin and mineral supplements as recommended by your health care provider.  Do not drink alcohol if your health care provider tells you not to drink.  If you drink alcohol: ? Limit how much you have to 0-1 drink a day. ? Be aware of how much alcohol is in  your drink. In the [place] one drink equals one 12 oz bottle of beer (355 mL), one 5 oz glass of wine (148 mL), or one 1 oz glass of hard liquor (44 mL).   Lifestyle  Take daily care of your teeth and gums. Brush your teeth every morning and night with fluoride toothpaste. Floss one time each day.  Stay active. Exercise for at least 30 minutes 5 or more days each week.  Do not use any products that contain nicotine or tobacco, such as cigarettes, e-cigarettes, and chewing tobacco. If you need help quitting, ask your health care provider.  Do not use drugs.  If you are sexually active, practice safe sex. Use a condom or other form of protection in order to prevent STIs (sexually transmitted infections).  Talk with your health care provider about taking a low-dose aspirin or statin.  Find healthy ways to cope with stress, such as: ? Meditation, yoga, or listening to music. ? Journaling. ? Talking to a trusted person. ? Spending time with friends and family. Safety  Always wear your seat belt while driving or riding in a vehicle.  Do not drive: ? If you have been drinking alcohol. Do not ride with someone who has been drinking. ? When you are tired or distracted. ? While texting.  Wear a helmet and other protective equipment during sports activities.  If you have firearms in your house, make sure you follow all gun safety procedures. What's next?  Visit your health care provider once a year for an annual wellness visit.  Ask your health care provider how often you should have your eyes and teeth checked.  Stay up to date on all vaccines. This information is not intended to replace advice given to you by your health care provider. Make sure you discuss any questions you have with your health care provider. Document Revised: 02/11/2020 Document Reviewed: 02/14/2018 Elsevier Patient Education  2021 [employer].  Shoulder Pain Many things can cause shoulder pain,  including:  An injury to the shoulder.  Overuse of the shoulder.  Arthritis. The source of the pain can be:  Inflammation.  An injury to the shoulder joint.  An injury to a tendon, ligament, or bone. Follow these instructions at home: Pay attention to changes in your symptoms. Let your health care provider know about them. Follow these instructions to relieve your pain. If you have a sling:  Wear the sling as told by your health care provider. Remove it only as told by your health care provider.  Loosen the sling if your fingers tingle, become numb, or turn cold and blue.  Keep the sling clean.  If the sling is not waterproof: ? Do not let it get wet. Remove it to shower or bathe.  Move your arm as little as possible, but keep your hand moving to prevent swelling. Managing pain, stiffness, and swelling  If directed, put ice on the painful area: ? Put ice in a plastic bag. ? Place a towel between your skin and the bag. ? Leave the ice on for 20 minutes, 2-3 times per day. Stop  applying ice if it does not help with the pain.  Squeeze a soft ball or a foam pad as much as possible. This helps to keep the shoulder from swelling. It also helps to strengthen the arm.   General instructions  Take over-the-counter and prescription medicines only as told by your health care provider.  Keep all follow-up visits as told by your health care provider. This is important. Contact a health care provider if:  Your pain gets worse.  Your pain is not relieved with medicines.  New pain develops in your arm, hand, or fingers. Get help right away if:  Your arm, hand, or fingers: ? Tingle. ? Become numb. ? Become swollen. ? Become painful. ? Turn white or blue. Summary  Shoulder pain can be caused by an injury, overuse, or arthritis.  Pay attention to changes in your symptoms. Let your health care provider know about them.  This condition may be treated with a sling, ice, and  pain medicines.  Contact your health care provider if the pain gets worse or new pain develops. Get help right away if your arm, hand, or fingers tingle or become numb, swollen, or painful.  Keep all follow-up visits as told by your health care provider. This is important. This information is not intended to replace advice given to you by your health care provider. Make sure you discuss any questions you have with your health care provider. Document Revised: 09/04/2017 Document Reviewed: 09/04/2017 Elsevier Patient Education  2021 [employer].

## 2020-05-20 NOTE — Assessment & Plan Note (Signed)
Bilateral worse at the end of the day. Minimize sodium less than 1500 mg a day, elevate feet above heart for 15 minutes 3 x day, wear compression hose each day especially if having a long day. Repeat echo to evaluate

## 2020-05-20 NOTE — Progress Notes (Signed)
Patient ID: Erika [name], female    DOB: 06/14/1945  Age: 75 y.o. MRN: [id number]    Subjective:  Subjective  HPI Erika [name] presents for comprehensive physical exam today. Erika [name] reports having a tree fall in her yard recently which has ultimately required her to remove the tree herself and as a result Erika [name] developed right shoulder pain x 2 weeks. Erika [name] describes as having an achy and constant pain that shoots down her right arm. Erika [name] reports that reaching posteriorly exacerbates the pain. Erika [name] also notes that the symptoms are improving, but slowly. Erika [name] has been taking Tylenol, but only mild relief. Erika [name] ask if there are any other medications Erika [name] could use to relief her symptoms.  Erika [name] complains of bilateral swelling ankles. Erika [name] notices her swelling when taking shoes and socks off at night. Erika [name] endorses having memory deficits. Erika [name] is taking an OTC medications to improve her cognitive function. Erika [name] reports having occasional chest pain when completing physical activity like playing children or pulling the trash can. Erika [name] describes having chest tightness that lasting for a second and then staying constant. Erika [name] mentions that slowing down relieves her symptoms. Erika [name] denies having any SOB or palpitations during the episode. Erika [name] denies any abdominal pain, cough, chills, sore throat, dysuria, urinary incontinence, back pain, HA, or N/VD at the time. Erika [name] denies any sleep difficulties.   Erika [name] does having sneezing fits but Erika [name] relates to being out in the yard and seasonal allergies. Erika [name] doesn't take Claritin to treat, Erika [name] allows symptoms to resolve. Erika [name] has no concerns about this.   Her husband was recently dx with prostate CA, Erika [name] reports that he is tolerating chemo well.   Review of Systems  Constitutional: Negative for chills, fatigue and fever.  HENT: Negative for congestion, ear pain, rhinorrhea, sinus pressure, sinus pain and sore throat.   Eyes: Negative for pain.  Respiratory: Negative for cough and  shortness of breath.   Cardiovascular: Positive for chest pain (occcasional). Negative for palpitations.  Gastrointestinal: Negative for abdominal pain, blood in stool, diarrhea, nausea and vomiting.  Genitourinary: Negative for dysuria, flank pain, vaginal bleeding, vaginal discharge and vaginal pain.  Musculoskeletal: Negative for back pain and myalgias.       (+)R shoulder pain secondary to overuse (+)Bilateral foot swelling  Skin: Negative for rash.  Neurological: Negative for dizziness and headaches.  Psychiatric/Behavioral:       (+)memory deficit    History Past Medical History:  Diagnosis Date  . Allergic state 06/24/2012  . Anterior cervical lymphadenopathy 02/08/2013  . Arthritis 04/02/2011  . Bronchitis 04/02/2011  . Chicken pox as a child  . Diverticulosis of colon    colonoscopy 04/14/1999  . GERD (gastroesophageal reflux disease)   . History of fibrocystic disease of breast   . Hyperlipidemia, mixed 02/20/2007   Followed as Primary Care Patient/ [facility]/ [facility]  Goal LDL < 130 HBP/Pos fm hx      . Hypertension    on 4 medications for HTN  . Hypokalemia 06/24/2012  . Hyponatremia 08/02/2014  . IBS (irritable bowel syndrome) 04/02/2011  . Measles as a child  . Mumps as a child  . Obesity   . Osteopenia   . Osteopenia after menopause   . Overactive bladder 04/02/2011  . Overweight   . Preventative health care 03/13/2012  . Shoulder pain, bilateral 06/14/2011  . Sleep apnea    uses cpap    Erika [name] has a past surgical history that includes Breast reduction surgery (1986);  Bunionectomy (Bilateral, 1980); Mouth surgery (01-10-13); Hammer toe surgery (Right, 12-03-13); Tonsillectomy; Cataract extraction, bilateral; Colonoscopy (2011); and Upper gastrointestinal endoscopy (2016).   Her family history includes Alcohol abuse in her son; Aneurysm in her brother; Bipolar disorder in her son; Heart attack (age of onset: 59) in her father; Heart disease in her brother and father;  Hyperlipidemia in her brother and mother; Hypertension in her brother, father, and mother; Mental illness in her son; Multiple sclerosis in her daughter; Schizophrenia in her son; Stroke (age of onset: 1) in her mother.Erika [name] reports that Erika [name] has never smoked. Erika [name] has never used smokeless tobacco. Erika [name] reports that Erika [name] does not drink alcohol and does not use drugs.  Current Outpatient Medications on File Prior to Visit  Medication Sig Dispense Refill  . amLODipine (NORVASC) 10 MG tablet Take 0.5 tablets (5 mg total) by mouth 2 (two) times daily. 90 tablet 1  . aspirin EC 81 MG tablet Take 1 tablet (81 mg total) by mouth daily.    Marland Kitchen atorvastatin (LIPITOR) 10 MG tablet TAKE 1 TABLET BY MOUTH EVERY DAY 90 tablet 0  . calcium carbonate (TUMS - DOSED IN MG ELEMENTAL CALCIUM) 500 MG chewable tablet Chew 1 tablet by mouth as needed.    . cetirizine (ZYRTEC) 10 MG tablet Take 10 mg by mouth at bedtime as needed.    . famotidine (PEPCID) 40 MG tablet Take 1 tablet (40 mg total) by mouth at bedtime as needed for heartburn or indigestion. 90 tablet 3  . furosemide (LASIX) 20 MG tablet Take 0.5 tablets (10 mg total) by mouth daily as needed for edema. 45 tablet 1  . metoprolol succinate (TOPROL-XL) 50 MG 24 hr tablet TAKE 1 TABLET BY MOUTH TWICE A DAY WITH OR IMMEDIATELY FOLLOWING A MEAL 180 tablet 2  . Multiple Minerals-Vitamins (CITRACAL PLUS PO) Take 1 tablet by mouth daily.    Marland Kitchen omeprazole (PRILOSEC) 20 MG capsule TAKE 1 CAPSULE BY MOUTH EVERY DAY 90 capsule 2  . trospium (SANCTURA) 20 MG tablet Take 20 mg by mouth daily.    . valsartan (DIOVAN) 320 MG tablet Take 1 tablet (320 mg total) by mouth daily. 90 tablet 1   No current facility-administered medications on file prior to visit.     Objective:  Objective  Physical Exam Vitals and nursing note reviewed.  Constitutional:      General: Erika [name] is not in acute distress.    Appearance: Normal appearance. Erika [name] is well-developed. Erika [name] is not ill-appearing.   HENT:     Head: Normocephalic and atraumatic.     Right Ear: Tympanic membrane, ear canal and external ear normal.     Left Ear: Tympanic membrane, ear canal and external ear normal.     Nose: Nose normal.     Mouth/Throat:     Mouth: Mucous membranes are moist.     Pharynx: Oropharynx is clear. No oropharyngeal exudate or posterior oropharyngeal erythema.  Eyes:     General:        Right eye: No discharge.        Left eye: No discharge.     Extraocular Movements: Extraocular movements intact.     Conjunctiva/sclera: Conjunctivae normal.     Pupils: Pupils are equal, round, and reactive to light.  Neck:     Thyroid: No thyromegaly.     Vascular: No JVD.  Cardiovascular:     Rate and Rhythm: Normal rate and regular rhythm.     Pulses: Normal pulses.  Heart sounds: Normal heart sounds. No murmur heard.   Pulmonary:     Effort: Pulmonary effort is normal. No respiratory distress.     Breath sounds: Normal breath sounds. No wheezing or rales.  Abdominal:     General: Bowel sounds are normal. There is no distension.     Palpations: Abdomen is soft. There is no mass.     Tenderness: There is no abdominal tenderness. There is no guarding or rebound.  Genitourinary:    Vagina: Normal.  Musculoskeletal:        General: No tenderness. Normal range of motion.     Cervical back: Normal range of motion and neck supple.  Lymphadenopathy:     Cervical: No cervical adenopathy.  Skin:    General: Skin is warm and dry.     Findings: No erythema or rash.  Neurological:     Mental Status: Erika [name] is alert and oriented to person, place, and time.     Cranial Nerves: No cranial nerve deficit.     Sensory: Sensation is intact. No sensory deficit.     Deep Tendon Reflexes: Reflexes are normal and symmetric.     Reflex Scores:      Patellar reflexes are 2+ on the right side and 2+ on the left side. Psychiatric:        Behavior: Behavior normal.    BP (!) 200/90 (BP Location: Left Arm,  Patient Position: Sitting, Cuff Size: Normal)   Pulse (!) 56   Resp 18   Ht 5\' 6"  (1.676 m)   Wt 166 lb (75.3 kg)   SpO2 99%   BMI 26.79 kg/m  Wt Readings from Last 3 Encounters:  05/20/20 166 lb (75.3 kg)  05/13/20 170 lb (77.1 kg)  05/06/20 155 lb (70.3 kg)     Lab Results  Component Value Date   WBC 5.8 05/20/2020   HGB 11.8 (L) 05/20/2020   HCT 35.7 (L) 05/20/2020   PLT 356.0 05/20/2020   GLUCOSE 87 05/20/2020   CHOL 204 (H) 05/20/2020   TRIG 124.0 05/20/2020   HDL 46.10 05/20/2020   LDLDIRECT 163.0 07/24/2014   LDLCALC 133 (H) 05/20/2020   ALT 13 05/20/2020   AST 15 05/20/2020   NA 139 05/20/2020   K 3.7 05/20/2020   CL 103 05/20/2020   CREATININE 0.94 05/20/2020   BUN 14 05/20/2020   CO2 26 05/20/2020   TSH 0.75 05/20/2020    DG Chest 2 View  Result Date: 05/06/2020 CLINICAL DATA:  Right shoulder and arm pain since moving a fallen tree 1 week ago EXAM: CHEST - 2 VIEW COMPARISON:  07/22/2008 FINDINGS: Normal sized heart. Interval moderate-sized hiatal hernia. Clear lungs with normal vascularity. Mild thoracic spine degenerative changes. No fracture or pneumothorax seen. IMPRESSION: 1. No acute abnormality. 2. Interval moderate-sized hiatal hernia. Electronically Signed   By: Claudie Revering M.D.   On: 05/06/2020 11:43   DG Shoulder Right  Result Date: 05/06/2020 CLINICAL DATA:  Right shoulder pain since moving a fallen tree. EXAM: RIGHT SHOULDER - 2+ VIEW COMPARISON:  06/28/2018 FINDINGS: There is no evidence of fracture or dislocation. There is no evidence of arthropathy or other focal bone abnormality. Soft tissues are unremarkable. IMPRESSION: Normal examination. Electronically Signed   By: Claudie Revering M.D.   On: 05/06/2020 11:40   DG Elbow Complete Right  Result Date: 05/06/2020 CLINICAL DATA:  Right shoulder and arm pain since moving a fall tree 1 week ago. EXAM: RIGHT ELBOW - COMPLETE 3+ VIEW  COMPARISON:  None. FINDINGS: There is no evidence of fracture,  dislocation, or joint effusion. There is no evidence of arthropathy or other focal bone abnormality. Soft tissues are unremarkable. IMPRESSION: Negative. Electronically Signed   By: Claudie Revering M.D.   On: 05/06/2020 11:41     Assessment & Plan:  Plan    No orders of the defined types were placed in this encounter.   Problem List Items Addressed This Visit    Hyperlipidemia, mixed   Relevant Orders   Lipid panel (Completed)   Hypertension   Osteopenia    Encouraged to get adequate exercise, calcium and vitamin d intake      Preventative health care    Patient encouraged to maintain heart healthy diet, regular exercise, adequate sleep. Consider daily probiotics. Take medications as prescribed. Labs ordered and reviewed. MGM ordered today      Hypokalemia   Chest pain    Erika [name] describes intermittent episodes of squeezing chest pain. Wakes her at times and happens during the day at other times. Erika [name] also notes some pedal edema recently worse at the end of the day. Erika [name] is referred to cardiology, echo is ordered and Erika [name] is advised to minimize salt. Elevate feet above heart and try compression hose.      Relevant Orders   Ambulatory referral to Cardiology   CBC (Completed)   Comprehensive metabolic panel (Completed)   TSH (Completed)   Low vitamin D level    Supplement and monitor      Sleep apnea    Wearing her CPAP and sleeping much better      Mild cognitive impairment    Offered referral for neuropsychiatric testing, declines for now but will let us know if that changes      Pedal edema    Bilateral worse at the end of the day. Minimize sodium less than 1500 mg a day, elevate feet above heart for 15 minutes 3 x day, wear compression hose each day especially if having a long day. Repeat echo to evaluate      Sun-damaged skin   Relevant Orders   Ambulatory referral to Dermatology   Mitral valve disorder    Repeat echo      Relevant Orders   ECHOCARDIOGRAM COMPLETE    Ambulatory referral to Cardiology    Other Visit Diagnoses    Encounter for screening for malignant neoplasm of breast, unspecified screening modality    -  Primary   Relevant Orders   MM 3D SCREEN BREAST BILATERAL    Mammo-last completed 11/03/2016, results normal, imaging due and Erika [name] states that Erika [name] is willing to undergo  Dexa-last completed on 11/03/2016, Osteopenia noted, repeat in 2 years  Colonoscopy- last completed on 08/08/2019, hemorrhoids and pylops noted but otherwise normal, repeat in 3-5 years  Follow-up: Return in about 3 months (around 08/20/2020).   I,Alexis Bryant,acting as a Education administrator for Penni Homans, MD.,have documented all relevant documentation on the behalf of Penni Homans, MD,as directed by  Penni Homans, MD while in the presence of Penni Homans, MD.   Medical screening examination/treatment was performed by qualified clinical staff member and as supervising physician I was immediately available for consultation/collaboration. I have reviewed documentation and agree with assessment and plan.  Penni Homans, MD

## 2020-05-23 DIAGNOSIS — L578 Other skin changes due to chronic exposure to nonionizing radiation: Secondary | ICD-10-CM | POA: Insufficient documentation

## 2020-05-23 DIAGNOSIS — I059 Rheumatic mitral valve disease, unspecified: Secondary | ICD-10-CM | POA: Insufficient documentation

## 2020-05-23 HISTORY — DX: Rheumatic mitral valve disease, unspecified: I05.9

## 2020-05-23 NOTE — Assessment & Plan Note (Signed)
She describes intermittent episodes of squeezing chest pain. Wakes her at times and happens during the day at other times. She also notes some pedal edema recently worse at the end of the day. She is referred to cardiology, echo is ordered and she is advised to minimize salt. Elevate feet above heart and try compression hose.

## 2020-05-23 NOTE — Assessment & Plan Note (Addendum)
Patient encouraged to maintain heart healthy diet, regular exercise, adequate sleep. Consider daily probiotics. Take medications as prescribed. Labs ordered and reviewed. MGM ordered today

## 2020-05-23 NOTE — Assessment & Plan Note (Signed)
Supplement and monitor 

## 2020-05-23 NOTE — Assessment & Plan Note (Signed)
Repeat echo

## 2020-05-23 NOTE — Assessment & Plan Note (Addendum)
Offered referral for neuropsychiatric testing, declines for now but will let us know if that changes

## 2020-05-23 NOTE — Assessment & Plan Note (Signed)
Encouraged to get adequate exercise, calcium and vitamin d intake 

## 2020-05-24 ENCOUNTER — Encounter: Payer: Self-pay | Admitting: *Deleted

## 2020-05-25 DIAGNOSIS — B019 Varicella without complication: Secondary | ICD-10-CM | POA: Insufficient documentation

## 2020-05-25 DIAGNOSIS — M8580 Other specified disorders of bone density and structure, unspecified site: Secondary | ICD-10-CM | POA: Insufficient documentation

## 2020-05-25 DIAGNOSIS — E669 Obesity, unspecified: Secondary | ICD-10-CM | POA: Insufficient documentation

## 2020-05-25 DIAGNOSIS — Z8619 Personal history of other infectious and parasitic diseases: Secondary | ICD-10-CM | POA: Insufficient documentation

## 2020-05-25 DIAGNOSIS — Z780 Asymptomatic menopausal state: Secondary | ICD-10-CM | POA: Insufficient documentation

## 2020-05-25 DIAGNOSIS — K219 Gastro-esophageal reflux disease without esophagitis: Secondary | ICD-10-CM | POA: Insufficient documentation

## 2020-06-01 NOTE — Progress Notes (Signed)
Cardiology Office Note:    Date:  06/02/2020   ID:  Erika [name], DOB 1945/07/20, MRN [id number]  PCP:  Erika Lukes, MD  Cardiologist:  Erika More, MD   Referring MD: Erika Lukes, MD  ASSESSMENT:    1. Chest pain of uncertain etiology   2. Primary hypertension   3. Hyperlipidemia, mixed    PLAN:    In order of problems listed above:  1. 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  Her symptoms are possible angina.  I advised her to undergo an ischemia evaluation cardiac CTA.  Risk benefits and options detailed.  She tells me in general she is hesitant to have procedures done told her to appointment nobody is and what is alcoholic and strongly advised to follow through with the CTA.  Also be able to look at her thoracic aorta and other thoracic structures at the same time. 2. BP remains elevated she is working with her primary care physician I think her dependent edema is a calcium channel blocker but I did not alter her medications 3. Continue high intensity statin 4. I did not address her problems with memory and speech she was concerned that was related to chest pain  Next appointment 6 weeks after she has cardiac CTA performed   Medication Adjustments/Labs and Tests Ordered: Current medicines are reviewed at length with the patient today.  Concerns regarding medicines are outlined above.  No orders of the defined types were placed in this encounter.  No orders of the defined types were placed in this encounter.    Chief Complaint  Patient presents with  . Chest Pain    History of Present Illness:    Erika [name] is a 75 y.o. female who is being seen today for the evaluation of chest pain at the request of Erika Lukes, MD.  She was seen previously by Dr. Stanford [name] 09/20/2016 for chest pain to be not ischemic in nature and at that time did not have an ischemia  evaluation.  Recently seen: [name] emergency room 05/06/2020 with right shoulder pain EKG showed sinus bradycardia LVH voltage nonspecific ST abnormality 2018 comparison is unchanged High-sensitivity troponin was low CMP showed a potassium of 3.4 creatinine elevated 1.2 GFR decreased stage III CKD 48 cc/min CBC was normal.  Chart review shows an echocardiogram 06/17/2016 showing normal left ventricular function EF 60 to 65% with mild LVH normal diastolic filling pressure mitral regurgitation mild to moderate left atrial enlargement.  I assume she was here as a consequence of ED visit she is not She has a pattern for several years of intermittent chest pain she describes as moderate substernal no radiation associated shortness of breath is not exertional but is relieved with rest possible angina not pleuritic she is not diaphoretic no associated GI symptoms it happens perhaps 1 time a month in her pattern and frequency have not changed. With the patient her primary care doctor concerned about CAD. Patient's primary concern is difficulty with speech finding words and memory. She also notices taking a higher dose of amlodipine she has mild dependent edema. She has no known history of heart disease congenital rheumatic atrial fibrillation or heart murmur.  Past Medical History:  Diagnosis Date  . Allergic state 06/24/2012  . Anterior cervical lymphadenopathy 02/08/2013  . Arthritis 04/02/2011  . Bronchitis 04/02/2011  . Chicken pox as a child  . Diverticulosis of colon  colonoscopy 04/14/1999  . GERD (gastroesophageal reflux disease)   . History of fibrocystic disease of breast   . Hyperlipidemia, mixed 02/20/2007   Followed as Primary Care Patient/ [facility]/ [facility]  Goal LDL < 130 HBP/Pos fm hx      . Hypertension    on 4 medications for HTN  . Hypokalemia 06/24/2012  . Hyponatremia 08/02/2014  . IBS (irritable bowel syndrome) 04/02/2011  . Measles as a child  . Mumps as a child   . Obesity   . Osteopenia   . Osteopenia after menopause   . Overactive bladder 04/02/2011  . Overweight   . Preventative health care 03/13/2012  . Shoulder pain, bilateral 06/14/2011  . Sleep apnea    uses cpap    Past Surgical History:  Procedure Laterality Date  . BREAST REDUCTION SURGERY  1986  . BUNIONECTOMY Bilateral 1980  . CATARACT EXTRACTION, BILATERAL    . COLONOSCOPY  2011  . HAMMER TOE SURGERY Right 12-03-13   foot  . MOUTH SURGERY  01-10-13   negative  . TONSILLECTOMY    . UPPER GASTROINTESTINAL ENDOSCOPY  2016   [facility]    Current Medications: Current Meds  Medication Sig  . amLODipine (NORVASC) 10 MG tablet Take 0.5 tablets (5 mg total) by mouth 2 (two) times daily. (Patient taking differently: Take 10 mg by mouth daily.)  . aspirin EC 81 MG tablet Take 1 tablet (81 mg total) by mouth daily.  Marland Kitchen atorvastatin (LIPITOR) 10 MG tablet TAKE 1 TABLET BY MOUTH EVERY DAY  . calcium carbonate (TUMS - DOSED IN MG ELEMENTAL CALCIUM) 500 MG chewable tablet Chew 1 tablet by mouth as needed.  . famotidine (PEPCID) 40 MG tablet Take 1 tablet (40 mg total) by mouth at bedtime as needed for heartburn or indigestion.  . furosemide (LASIX) 20 MG tablet Take 0.5 tablets (10 mg total) by mouth daily as needed for edema. (Patient taking differently: Take 20 mg by mouth daily as needed for edema.)  . metoprolol succinate (TOPROL-XL) 50 MG 24 hr tablet TAKE 1 TABLET BY MOUTH TWICE A DAY WITH OR IMMEDIATELY FOLLOWING A MEAL  . Multiple Minerals-Vitamins (CITRACAL PLUS PO) Take 1 tablet by mouth daily.  . trospium (SANCTURA) 20 MG tablet Take 20 mg by mouth daily.  Marland Kitchen UNABLE TO FIND COGNITIVE TABLET DAILY  . valsartan (DIOVAN) 320 MG tablet Take 1 tablet (320 mg total) by mouth daily.     Allergies:   Patient has no known allergies.   Social History   Socioeconomic History  . Marital status: Married    Spouse name: Not on file  . Number of children: 4  . Years of education: Not on file  .  Highest education level: Not on file  Occupational History  . Occupation: dental hygenist  Tobacco Use  . Smoking status: Never Smoker  . Smokeless tobacco: Never Used  Vaping Use  . Vaping Use: Never used  Substance and Sexual Activity  . Alcohol use: No  . Drug use: No  . Sexual activity: Not Currently  Other Topics Concern  . Not on file  Social History Narrative   4 children 2 are adopted   Social Determinants of Health   Financial Resource Strain: Not on file  Food Insecurity: Not on file  Transportation Needs: Not on file  Physical Activity: Not on file  Stress: Not on file  Social Connections: Not on file     Family History: The patient's family history includes Alcohol  abuse in her son; Aneurysm in her brother; Bipolar disorder in her son; Heart attack (age of onset: 50) in her father; Heart disease in her brother and father; Hyperlipidemia in her brother and mother; Hypertension in her brother, father, and mother; Mental illness in her son; Multiple sclerosis in her daughter; Schizophrenia in her son; Stroke (age of onset: 35) in her mother. There is no history of Colon cancer, Colon polyps, Esophageal cancer, Rectal cancer, or Stomach cancer.  ROS:   ROS Please see the history of present illness.     All other systems reviewed and are negative.  EKGs/Labs/Other Studies Reviewed:    The following studies were reviewed today:    Recent Labs: 05/20/2020: ALT 13; BUN 14; Creatinine, Ser 0.94; Hemoglobin 11.8; Platelets 356.0; Potassium 3.7; Sodium 139; TSH 0.75  Recent Lipid Panel    Component Value Date/Time   CHOL 204 (H) 05/20/2020 1129   TRIG 124.0 05/20/2020 1129   HDL 46.10 05/20/2020 1129   CHOLHDL 4 05/20/2020 1129   VLDL 24.8 05/20/2020 1129   LDLCALC 133 (H) 05/20/2020 1129   LDLCALC 128 (H) 11/18/2019 0932   LDLDIRECT 163.0 07/24/2014 1215    Physical Exam:    VS:  BP (!) 184/70   Pulse 60   Ht 5\' 6"  (1.676 m)   Wt 168 lb (76.2 kg)   SpO2  97%   BMI 27.12 kg/m     Wt Readings from Last 3 Encounters:  06/02/20 168 lb (76.2 kg)  05/20/20 166 lb (75.3 kg)  05/13/20 170 lb (77.1 kg)     GEN:  Well nourished, well developed in no acute distress HEENT: Normal NECK: No JVD; No carotid bruits LYMPHATICS: No lymphadenopathy CARDIAC: RRR, no murmurs, rubs, gallops RESPIRATORY:  Clear to auscultation without rales, wheezing or rhonchi  ABDOMEN: Soft, non-tender, non-distended MUSCULOSKELETAL:  No edema; No deformity  SKIN: Warm and dry NEUROLOGIC:  Alert and oriented x 3 PSYCHIATRIC:  Normal affect     Signed, Erika More, MD  06/02/2020 9:56 AM    [place]

## 2020-06-02 ENCOUNTER — Ambulatory Visit: Payer: PPO | Admitting: Cardiology

## 2020-06-02 ENCOUNTER — Ambulatory Visit (HOSPITAL_BASED_OUTPATIENT_CLINIC_OR_DEPARTMENT_OTHER)
Admission: RE | Admit: 2020-06-02 | Discharge: 2020-06-02 | Disposition: A | Discharge status: Home / Self Care | Payer: PPO | Source: Ambulatory Visit | Attending: Family Medicine | Admitting: Family Medicine

## 2020-06-02 ENCOUNTER — Encounter: Payer: Self-pay | Admitting: Cardiology

## 2020-06-02 ENCOUNTER — Other Ambulatory Visit: Payer: Self-pay

## 2020-06-02 ENCOUNTER — Encounter (HOSPITAL_BASED_OUTPATIENT_CLINIC_OR_DEPARTMENT_OTHER): Payer: Self-pay

## 2020-06-02 VITALS — BP 184/70 | HR 60 | Ht 66.0 in | Wt 168.0 lb

## 2020-06-02 DIAGNOSIS — R079 Chest pain, unspecified: Secondary | ICD-10-CM | POA: Diagnosis not present

## 2020-06-02 DIAGNOSIS — I10 Essential (primary) hypertension: Secondary | ICD-10-CM | POA: Diagnosis not present

## 2020-06-02 DIAGNOSIS — E782 Mixed hyperlipidemia: Secondary | ICD-10-CM

## 2020-06-02 DIAGNOSIS — Z1239 Encounter for other screening for malignant neoplasm of breast: Secondary | ICD-10-CM | POA: Insufficient documentation

## 2020-06-02 NOTE — Addendum Note (Signed)
Addended by: Darius Bump B on: 06/02/2020 11:12 AM   Modules accepted: Orders

## 2020-06-02 NOTE — Patient Instructions (Signed)
Medication Instructions:  Your physician recommends that you continue on your current medications as directed. Please refer to the Current Medication list given to you today.  *If you need a refill on your cardiac medications before your next appointment, please call your pharmacy*   Lab Work: Your physician recommends that you return for lab work in: Within one week of your cardiac CT  BMP  If you have labs (blood work) drawn today and your tests are completely normal, you will receive your results only by: Marland Kitchen MyChart Message (if you have MyChart) OR . A paper copy in the mail If you have any lab test that is abnormal or we need to change your treatment, we will call you to review the results.   Testing/Procedures: Your cardiac CT will be scheduled at the below location:   [facility] [address] [place], [place] [address] [phone]  If scheduled at [facility], please arrive at the [facility] main entrance (entrance A) of [facility] 30 minutes prior to test start time. Proceed to the [facility] Radiology Department (first floor) to check-in and test prep.  Please follow these instructions carefully (unless otherwise directed):  On the Night Before the Test: . Be sure to Drink plenty of water. . Do not consume any caffeinated/decaffeinated beverages or chocolate 12 hours prior to your test. . Do not take any antihistamines 12 hours prior to your test.  On the Day of the Test: . Drink plenty of water until 1 hour prior to the test. . Do not eat any food 4 hours prior to the test. . You may take your regular medications prior to the test.  . Take metoprolol (Lopressor) two hours prior to test. . HOLD Furosemide morning of the test. . FEMALES- please wear underwire-free bra if available  After the Test: . Drink plenty of water. . After receiving IV contrast, you may experience a mild flushed feeling. This is normal. . On occasion, you  may experience a mild rash up to 24 hours after the test. This is not dangerous. If this occurs, you can take Benadryl 25 mg and increase your fluid intake. . If you experience trouble breathing, this can be serious. If it is severe call 911 IMMEDIATELY. If it is mild, please call our office. . If you take any of these medications: Glipizide/Metformin, Avandament, Glucavance, please do not take 48 hours after completing test unless otherwise instructed.   Once we have confirmed authorization from your insurance company, we will call you to set up a date and time for your test. Based on how quickly your insurance processes prior authorizations requests, please allow up to 4 weeks to be contacted for scheduling your Cardiac CT appointment. Be advised that routine Cardiac CT appointments could be scheduled as many as 8 weeks after your provider has ordered it.  For non-scheduling related questions, please contact the cardiac imaging nurse navigator should you have any questions/concerns: Marchia Bond, Cardiac Imaging Nurse Navigator Gordy Clement, Cardiac Imaging Nurse Navigator [facility] Direct Office Dial: [phone]   For scheduling needs, including cancellations and rescheduling, please call [place], [phone].     Follow-Up: At [facility], you and your health needs are our priority.  As part of our continuing mission to provide you with exceptional heart care, we have created designated Provider Care Teams.  These Care Teams include your primary Cardiologist (physician) and Advanced Practice Providers (APPs -  Physician Assistants  and Nurse Practitioners) who all work together to provide you with the care you need, when you need it.  We recommend signing up for the patient portal called "MyChart".  Sign up information is provided on this After Visit Summary.  MyChart is used to connect with patients for Virtual Visits (Telemedicine).  Patients are able  to view lab/test results, encounter notes, upcoming appointments, etc.  Non-urgent messages can be sent to your provider as well.   To learn more about what you can do with MyChart, go to [email or web address].    Your next appointment:   6 week(s)  The format for your next appointment:   In Person  Provider:   [name], MD   Other Instructions

## 2020-06-11 ENCOUNTER — Telehealth (HOSPITAL_COMMUNITY): Payer: Self-pay | Admitting: *Deleted

## 2020-06-11 ENCOUNTER — Telehealth (HOSPITAL_COMMUNITY): Payer: Self-pay | Admitting: Emergency Medicine

## 2020-06-11 NOTE — Telephone Encounter (Signed)
Reaching out to patient to offer assistance regarding upcoming cardiac imaging study; pt verbalizes understanding of appt date/time, parking situation and where to check in, pre-test NPO status and medications ordered, and verified current allergies; name and call back number provided for further questions should they arise Ilya Ess RN Navigator Cardiac Imaging [facility] [phone] office [phone] cell  Holding lasix 

## 2020-06-11 NOTE — Telephone Encounter (Signed)
Attempted to call patient regarding upcoming cardiac CT appointment. °Left message on voicemail with name and callback number ° °Kemari Mares RN Navigator Cardiac Imaging °[facility] °[phone] Office °[phone] Cell ° °

## 2020-06-14 ENCOUNTER — Other Ambulatory Visit: Payer: Self-pay

## 2020-06-14 ENCOUNTER — Ambulatory Visit (HOSPITAL_BASED_OUTPATIENT_CLINIC_OR_DEPARTMENT_OTHER)
Admission: RE | Admit: 2020-06-14 | Discharge: 2020-06-14 | Disposition: A | Discharge status: Home / Self Care | Payer: PPO | Source: Ambulatory Visit | Attending: Cardiology | Admitting: Cardiology

## 2020-06-14 ENCOUNTER — Ambulatory Visit (HOSPITAL_COMMUNITY): Admission: RE | Admit: 2020-06-14 | Payer: PPO | Source: Ambulatory Visit

## 2020-06-14 DIAGNOSIS — R079 Chest pain, unspecified: Secondary | ICD-10-CM | POA: Diagnosis not present

## 2020-06-14 DIAGNOSIS — K449 Diaphragmatic hernia without obstruction or gangrene: Secondary | ICD-10-CM | POA: Diagnosis not present

## 2020-06-14 DIAGNOSIS — I700 Atherosclerosis of aorta: Secondary | ICD-10-CM | POA: Insufficient documentation

## 2020-06-14 MED ORDER — DILTIAZEM HCL 25 MG/5ML IV SOLN
10.0000 mg | Freq: Once | INTRAVENOUS | Status: AC
  Administered 2020-06-14 (×2): 10 mg via INTRAVENOUS

## 2020-06-14 MED ORDER — METOPROLOL TARTRATE 5 MG/5ML IV SOLN
5.0000 mg | INTRAVENOUS | Status: DC | PRN
  Administered 2020-06-14 (×2): 5 mg via INTRAVENOUS

## 2020-06-14 MED ORDER — IOHEXOL 350 MG/ML SOLN
95.0000 mL | Freq: Once | INTRAVENOUS | Status: AC | PRN
  Administered 2020-06-14: 95 mL via INTRAVENOUS

## 2020-06-14 MED ORDER — NITROGLYCERIN 0.4 MG SL SUBL
0.8000 mg | SUBLINGUAL_TABLET | Freq: Once | SUBLINGUAL | Status: AC
  Administered 2020-06-14 (×2): 0.8 mg via SUBLINGUAL

## 2020-06-15 ENCOUNTER — Telehealth: Payer: Self-pay

## 2020-06-15 DIAGNOSIS — R079 Chest pain, unspecified: Secondary | ICD-10-CM

## 2020-06-15 MED ORDER — ROSUVASTATIN CALCIUM 20 MG PO TABS: 20.0000 mg | ORAL_TABLET | Freq: Every day | ORAL | 3 refills | Status: DC

## 2020-06-15 NOTE — Telephone Encounter (Signed)
Spoke with patient regarding results and recommendation.  Patient verbalizes understanding and is agreeable to plan of care. Advised patient to call back with any issues or concerns.   She would like to wait on scheduling the stress test until she speaks with Dr. Bettina Gavia in person in May.

## 2020-06-15 NOTE — Telephone Encounter (Signed)
-----   Message from Richardo Priest, MD sent at 06/15/2020  7:48 AM EDT ----- Calcium score is very high she should be on a statin and like her to begin rosuvastatin 20 mg daily  Unfortunately despite imaging her twice this study is limited with artifact from motion of breathing  I would like her to have a [place] Myoview performed at [facility].

## 2020-06-16 DIAGNOSIS — G4733 Obstructive sleep apnea (adult) (pediatric): Secondary | ICD-10-CM | POA: Diagnosis not present

## 2020-07-09 ENCOUNTER — Ambulatory Visit (HOSPITAL_BASED_OUTPATIENT_CLINIC_OR_DEPARTMENT_OTHER)
Admission: RE | Admit: 2020-07-09 | Discharge: 2020-07-09 | Disposition: A | Discharge status: Home / Self Care | Payer: PPO | Source: Ambulatory Visit | Attending: Family Medicine | Admitting: Family Medicine

## 2020-07-09 ENCOUNTER — Other Ambulatory Visit: Payer: Self-pay

## 2020-07-09 DIAGNOSIS — R079 Chest pain, unspecified: Secondary | ICD-10-CM | POA: Diagnosis not present

## 2020-07-09 DIAGNOSIS — R0609 Other forms of dyspnea: Secondary | ICD-10-CM

## 2020-07-09 DIAGNOSIS — I340 Nonrheumatic mitral (valve) insufficiency: Secondary | ICD-10-CM

## 2020-07-09 DIAGNOSIS — I059 Rheumatic mitral valve disease, unspecified: Secondary | ICD-10-CM

## 2020-07-12 LAB — ECHOCARDIOGRAM COMPLETE
AR max vel: 2.23 cm2
AV Area VTI: 2.05 cm2
AV Area mean vel: 2.22 cm2
AV Mean grad: 5 mmHg
AV Peak grad: 9.6 mmHg
Ao pk vel: 1.55 m/s
Area-P 1/2: 4.21 cm2
Calc EF: 63.9 %
S' Lateral: 2.63 cm
Single Plane A2C EF: 65.4 %
Single Plane A4C EF: 61.3 %

## 2020-07-15 NOTE — Progress Notes (Signed)
Cardiology Office Note:    Date:  07/16/2020   ID:  Erika [name], DOB 09-Aug-1945, MRN [id number]  PCP:  [name], MD  Cardiologist:  [name], MD    Referring MD: [name], MD    ASSESSMENT:    1. Chest pain, unspecified type   2. Primary hypertension   3. Hyperlipidemia, mixed   4. Agatston coronary artery calcium score greater than 400    PLAN:    In order of problems listed above:  1. Unfortunately despite 2 attempts cardiac CTA is uninterpretable.  She has a very high coronary artery calcium score clearly needs to be on a statin and I think would benefit from further evaluation and after discussion with the patient and wife she will undergo stress myocardial perfusion study.  If normal I will see her back in the office as needed. 2. Stable continue current treatment 3. Discontinue atorvastatin continue rosuvastatin   Next appointment: As needed depending on the results of her myocardial perfusion study   Medication Adjustments/Labs and Tests Ordered: Current medicines are reviewed at length with the patient today.  Concerns regarding medicines are outlined above.  Orders Placed This Encounter  Procedures  . MYOCARDIAL PERFUSION IMAGING   No orders of the defined types were placed in this encounter.   Chief Complaint  Patient presents with  . Follow-up    Her primary concern is difficulty with memory  Coronary artery calcium score is quite high coronary angiography uninterpretable on CT due to motion artifact despite repeat imaging with recommendation for myocardial perfusion study     History of Present Illness:    Erika [name] is a 75 y.o. female with a hx of hypertension, hyperlipidemia and chest pain last seen 06/02/2020.Recently seen: [facility] emergency room 05/06/2020 with right shoulder pain EKG showed sinus bradycardia LVH voltage nonspecific ST abnormality 2018 comparison is unchanged High-sensitivity troponin was low CMP  showed a potassium of 3.4 creatinine elevated 1.2 GFR decreased stage III CKD 48 cc/min CBC was normal.   Chart review shows an echocardiogram 06/17/2016 showing normal left ventricular function EF 60 to 65% with mild LVH normal diastolic filling pressure mitral regurgitation mild to moderate left atrial enlargement.  Compliance with diet, lifestyle and medications: Yes but she appears to be taking 2 statins  Cardiac CTA 06/14/2020: IMPRESSION: 1. Coronary calcium score of 1020. This was 95th percentile for age and sex matched controls. 2. Normal coronary origin with right dominance. 3. Severe respiratory artifact and patient motion on first attempt. Study was repeated, and second study contained severe cardiac motion artifact. 4. The coronary arteries are not interpretable on this study.   Her husband is present participates in evaluation decision making and is fortunate as she thinks she has a severe problem with her memory. I reviewed with her that she should be on a statin and they think she is taking both atorvastatin and rosuvastatin and will stop the atorvastatin. She is not having chest pain She thinks that she can use a treadmill we will set her up for a stress Myoview. If she has no ischemia I will see her back in the office as needed. Past Medical History:  Diagnosis Date  . Allergic state 06/24/2012  . Anterior cervical lymphadenopathy 02/08/2013  . Arthritis 04/02/2011  . Bronchitis 04/02/2011  . Chicken pox as a child  . Diverticulosis of colon    colonoscopy 04/14/1999  . GERD (gastroesophageal reflux disease)   . History of fibrocystic  disease of breast   . Hyperlipidemia, mixed 02/20/2007   Followed as Primary Care Patient/ [facility]/ [facility]  Goal LDL < 130 HBP/Pos fm hx      . Hypertension    on 4 medications for HTN  . Hypokalemia 06/24/2012  . Hyponatremia 08/02/2014  . IBS (irritable bowel syndrome) 04/02/2011  . Measles as a child  . Mumps as a child  .  Obesity   . Osteopenia   . Osteopenia after menopause   . Overactive bladder 04/02/2011  . Overweight   . Preventative health care 03/13/2012  . Shoulder pain, bilateral 06/14/2011  . Sleep apnea    uses cpap    Past Surgical History:  Procedure Laterality Date  . BREAST REDUCTION SURGERY  1986  . BUNIONECTOMY Bilateral 1980  . CATARACT EXTRACTION, BILATERAL    . COLONOSCOPY  2011  . HAMMER TOE SURGERY Right 12-03-13   foot  . MOUTH SURGERY  01-10-13   negative  . TONSILLECTOMY    . UPPER GASTROINTESTINAL ENDOSCOPY  2016   [facility]    Current Medications: Current Meds  Medication Sig  . amLODipine (NORVASC) 10 MG tablet Take 0.5 tablets (5 mg total) by mouth 2 (two) times daily. (Patient taking differently: Take 10 mg by mouth daily.)  . aspirin EC 81 MG tablet Take 1 tablet (81 mg total) by mouth daily.  Marland Kitchen atorvastatin (LIPITOR) 10 MG tablet TAKE 1 TABLET BY MOUTH EVERY DAY  . calcium carbonate (TUMS - DOSED IN MG ELEMENTAL CALCIUM) 500 MG chewable tablet Chew 1 tablet by mouth daily.  . famotidine (PEPCID) 40 MG tablet Take 1 tablet (40 mg total) by mouth at bedtime as needed for heartburn or indigestion.  . furosemide (LASIX) 20 MG tablet Take 0.5 tablets (10 mg total) by mouth daily as needed for edema. (Patient taking differently: Take 20 mg by mouth daily as needed for edema.)  . metoprolol succinate (TOPROL-XL) 50 MG 24 hr tablet TAKE 1 TABLET BY MOUTH TWICE A DAY WITH OR IMMEDIATELY FOLLOWING A MEAL  . Multiple Minerals-Vitamins (CITRACAL PLUS PO) Take 1 tablet by mouth daily.  . rosuvastatin (CRESTOR) 20 MG tablet Take 1 tablet (20 mg total) by mouth daily.  . trospium (SANCTURA) 20 MG tablet Take 20 mg by mouth daily.  Marland Kitchen UNABLE TO FIND COGNITIVE TABLET DAILY  . valsartan (DIOVAN) 320 MG tablet Take 1 tablet (320 mg total) by mouth daily.     Allergies:   Patient has no known allergies.   Social History   Socioeconomic History  . Marital status: Married    Spouse name:  Not on file  . Number of children: 4  . Years of education: Not on file  . Highest education level: Not on file  Occupational History  . Occupation: dental hygenist  Tobacco Use  . Smoking status: Never Smoker  . Smokeless tobacco: Never Used  Vaping Use  . Vaping Use: Never used  Substance and Sexual Activity  . Alcohol use: No  . Drug use: No  . Sexual activity: Not Currently  Other Topics Concern  . Not on file  Social History Narrative   4 children 2 are adopted   Social Determinants of Radio broadcast assistant Strain: Not on file  Food Insecurity: Not on file  Transportation Needs: Not on file  Physical Activity: Not on file  Stress: Not on file  Social Connections: Not on file     Family History: The patient's family history includes  Alcohol abuse in her son; Aneurysm in her brother; Bipolar disorder in her son; Heart attack (age of onset: 49) in her father; Heart disease in her brother and father; Hyperlipidemia in her brother and mother; Hypertension in her brother, father, and mother; Mental illness in her son; Multiple sclerosis in her daughter; Schizophrenia in her son; Stroke (age of onset: 59) in her mother. There is no history of Colon cancer, Colon polyps, Esophageal cancer, Rectal cancer, or Stomach cancer. ROS:   Please see the history of present illness.    All other systems reviewed and are negative.  EKGs/Labs/Other Studies Reviewed:    The following studies were reviewed today:    Recent Labs: 05/20/2020: ALT 13; BUN 14; Creatinine, Ser 0.94; Hemoglobin 11.8; Platelets 356.0; Potassium 3.7; Sodium 139; TSH 0.75  Recent Lipid Panel    Component Value Date/Time   CHOL 204 (H) 05/20/2020 1129   TRIG 124.0 05/20/2020 1129   HDL 46.10 05/20/2020 1129   CHOLHDL 4 05/20/2020 1129   VLDL 24.8 05/20/2020 1129   LDLCALC 133 (H) 05/20/2020 1129   LDLCALC 128 (H) 11/18/2019 0932   LDLDIRECT 163.0 07/24/2014 1215    Physical Exam:    VS:  BP 135/65  (BP Location: Right Arm, Patient Position: Sitting, Cuff Size: Normal)   Pulse (!) 56   Ht 5\' 6"  (1.676 m)   Wt 171 lb 0.6 oz (77.6 kg)   SpO2 98%   BMI 27.61 kg/m     Wt Readings from Last 3 Encounters:  07/16/20 171 lb 0.6 oz (77.6 kg)  06/02/20 168 lb (76.2 kg)  05/20/20 166 lb (75.3 kg)     GEN:  Well nourished, well developed in no acute distress HEENT: Normal NECK: No JVD; No carotid bruits LYMPHATICS: No lymphadenopathy CARDIAC:RRR, no murmurs, rubs, gallops RESPIRATORY:  Clear to auscultation without rales, wheezing or rhonchi  ABDOMEN: Soft, non-tender, non-distended MUSCULOSKELETAL:  No edema; No deformity  SKIN: Warm and dry NEUROLOGIC:  Alert and oriented x 3 PSYCHIATRIC:  Normal affect    Signed, [name], MD  07/16/2020 9:41 AM    [place]

## 2020-07-16 ENCOUNTER — Encounter: Payer: Self-pay | Admitting: Cardiology

## 2020-07-16 ENCOUNTER — Other Ambulatory Visit: Payer: Self-pay

## 2020-07-16 ENCOUNTER — Ambulatory Visit: Payer: PPO | Admitting: Cardiology

## 2020-07-16 VITALS — BP 135/65 | HR 56 | Ht 66.0 in | Wt 171.0 lb

## 2020-07-16 DIAGNOSIS — R079 Chest pain, unspecified: Secondary | ICD-10-CM

## 2020-07-16 DIAGNOSIS — G4733 Obstructive sleep apnea (adult) (pediatric): Secondary | ICD-10-CM | POA: Diagnosis not present

## 2020-07-16 DIAGNOSIS — I10 Essential (primary) hypertension: Secondary | ICD-10-CM

## 2020-07-16 DIAGNOSIS — R931 Abnormal findings on diagnostic imaging of heart and coronary circulation: Secondary | ICD-10-CM

## 2020-07-16 DIAGNOSIS — E782 Mixed hyperlipidemia: Secondary | ICD-10-CM

## 2020-07-16 NOTE — Addendum Note (Signed)
Addended by: Resa Miner I on: 07/16/2020 09:45 AM   Modules accepted: Orders

## 2020-07-16 NOTE — Patient Instructions (Addendum)
Medication Instructions:  Your physician has recommended you make the following change in your medication:  STOP: Atorvastatin  *If you need a refill on your cardiac medications before your next appointment, please call your pharmacy*   Lab Work: None If you have labs (blood work) drawn today and your tests are completely normal, you will receive your results only by: Marland Kitchen MyChart Message (if you have MyChart) OR . A paper copy in the mail If you have any lab test that is abnormal or we need to change your treatment, we will call you to review the results.   Testing/Procedures:   [facility] [address], [place], [place] [address] Phone: [phone]    Please arrive 15 minutes prior to your appointment time for registration and insurance purposes.  The test will take approximately 3 to 4 hours to complete; you may bring reading material.  If someone comes with you to your appointment, they will need to remain in the main lobby due to limited space in the testing area. **If you are pregnant or breastfeeding, please notify the nuclear lab prior to your appointment**  How to prepare for your Myocardial Perfusion Test: . Do not eat or drink 3 hours prior to your test, except you may have water. . Do not consume products containing caffeine (regular or decaffeinated) 12 hours prior to your test. (ex: coffee, chocolate, sodas, tea). . Do bring a list of your current medications with you.  If not listed below, you may take your medications as normal. . Do wear comfortable clothes (no dresses or overalls) and walking shoes, tennis shoes preferred (No heels or open toe shoes are allowed). . Do NOT wear cologne, perfume, aftershave, or lotions (deodorant is allowed). . If these instructions are not followed, your test will have to be rescheduled.  Please report to [address], [address] for your test.  If you have questions or  concerns about your appointment, you can call the Nuclear Lab at [phone].  If you cannot keep your appointment, please provide 24 hours notification to the Nuclear Lab, to avoid a possible $50 charge to your account.    Follow-Up: At [facility], you and your health needs are our priority.  As part of our continuing mission to provide you with exceptional heart care, we have created designated Provider Care Teams.  These Care Teams include your primary Cardiologist (physician) and Advanced Practice Providers (APPs -  Physician Assistants and Nurse Practitioners) who all work together to provide you with the care you need, when you need it.  We recommend signing up for the patient portal called "MyChart".  Sign up information is provided on this After Visit Summary.  MyChart is used to connect with patients for Virtual Visits (Telemedicine).  Patients are able to view lab/test results, encounter notes, upcoming appointments, etc.  Non-urgent messages can be sent to your provider as well.   To learn more about what you can do with MyChart, go to [email or web address].    Your next appointment:   As needed  The format for your next appointment:   In Person  Provider:   [name], MD   Other Instructions

## 2020-07-20 ENCOUNTER — Telehealth (HOSPITAL_COMMUNITY): Payer: Self-pay

## 2020-07-20 NOTE — Telephone Encounter (Signed)
Detailed instructions left on the patient's answering machine. Asked to call back with any questions. S.Nykeria Mealing EMTP 

## 2020-07-22 ENCOUNTER — Other Ambulatory Visit: Payer: Self-pay

## 2020-07-22 ENCOUNTER — Ambulatory Visit (HOSPITAL_COMMUNITY): Payer: PPO | Attending: Cardiology

## 2020-07-22 DIAGNOSIS — I10 Essential (primary) hypertension: Secondary | ICD-10-CM | POA: Diagnosis not present

## 2020-07-22 DIAGNOSIS — E782 Mixed hyperlipidemia: Secondary | ICD-10-CM | POA: Diagnosis not present

## 2020-07-22 DIAGNOSIS — R931 Abnormal findings on diagnostic imaging of heart and coronary circulation: Secondary | ICD-10-CM | POA: Insufficient documentation

## 2020-07-22 DIAGNOSIS — R079 Chest pain, unspecified: Secondary | ICD-10-CM | POA: Diagnosis not present

## 2020-07-22 LAB — MYOCARDIAL PERFUSION IMAGING
Estimated workload: 7.3 METS
Exercise duration (min): 7 min
Exercise duration (sec): 30 s
LV dias vol: 78 mL (ref 46–106)
LV sys vol: 24 mL
MPHR: 146 {beats}/min
Peak HR: 126 {beats}/min
Percent HR: 86 %
RPE: 20
Rest BP: 56 mmHg
Rest HR: 56 {beats}/min
SDS: 1
SRS: 1
SSS: 2
TID: 0.95

## 2020-07-22 MED ORDER — TECHNETIUM TC 99M TETROFOSMIN IV KIT
9.6000 | PACK | Freq: Once | INTRAVENOUS | Status: AC | PRN
  Administered 2020-07-22: 9.6 via INTRAVENOUS
  Filled 2020-07-22: qty 10

## 2020-07-22 MED ORDER — REGADENOSON 0.4 MG/5ML IV SOLN: 0.4000 mg | Freq: Once | INTRAVENOUS | Status: DC

## 2020-07-22 MED ORDER — TECHNETIUM TC 99M TETROFOSMIN IV KIT
32.4000 | PACK | Freq: Once | INTRAVENOUS | Status: AC | PRN
  Administered 2020-07-22: 32.4 via INTRAVENOUS
  Filled 2020-07-22: qty 33

## 2020-07-23 ENCOUNTER — Telehealth: Payer: Self-pay

## 2020-07-23 NOTE — Telephone Encounter (Signed)
-----   Message from Richardo Priest, MD sent at 07/23/2020  2:29 PM EDT ----- This is a good report, the most important part of the test is the perfusion images are normal heart muscle function is normal I think this gives [place] the reassurance with looking for the current medications are correct

## 2020-07-23 NOTE — Telephone Encounter (Signed)
Spoke with patient regarding results and recommendation.  Patient verbalizes understanding and is agreeable to plan of care. Advised patient to call back with any issues or concerns.  

## 2020-08-08 ENCOUNTER — Other Ambulatory Visit: Payer: Self-pay | Admitting: Family Medicine

## 2020-08-16 DIAGNOSIS — G4733 Obstructive sleep apnea (adult) (pediatric): Secondary | ICD-10-CM | POA: Diagnosis not present

## 2020-09-09 ENCOUNTER — Other Ambulatory Visit: Payer: Self-pay | Admitting: Family Medicine

## 2020-09-13 ENCOUNTER — Telehealth: Payer: Self-pay | Admitting: *Deleted

## 2020-09-13 ENCOUNTER — Ambulatory Visit (HOSPITAL_BASED_OUTPATIENT_CLINIC_OR_DEPARTMENT_OTHER)
Admission: RE | Admit: 2020-09-13 | Discharge: 2020-09-13 | Disposition: A | Discharge status: Home / Self Care | Payer: PPO | Source: Ambulatory Visit | Attending: Family Medicine | Admitting: Family Medicine

## 2020-09-13 ENCOUNTER — Other Ambulatory Visit: Payer: Self-pay

## 2020-09-13 ENCOUNTER — Encounter (HOSPITAL_BASED_OUTPATIENT_CLINIC_OR_DEPARTMENT_OTHER): Payer: Self-pay

## 2020-09-13 DIAGNOSIS — Z1231 Encounter for screening mammogram for malignant neoplasm of breast: Secondary | ICD-10-CM | POA: Diagnosis not present

## 2020-09-13 NOTE — Telephone Encounter (Signed)
From Dr. Charlett Blake we could do a VV Friday morning if it could include her husband or an in person at 4 oclock on Monday if they think it can wait. otherwise they should keep Friday with Larose Kells

## 2020-09-13 NOTE — Telephone Encounter (Signed)
From Dr. Charlett Blake radiology reports patient was confused and argumentative when she came in for [facility] today please schedule her an appt soon. could be Virtual if that is the best way for her husband to join also.

## 2020-09-13 NOTE — Telephone Encounter (Signed)
Left message on machine for them to call back.  Will try again tomorrow.

## 2020-09-13 NOTE — Telephone Encounter (Signed)
I thought they had Erika [name] down with you, but she is down to see Dr. Larose Kells on Friday 09/16/20.  the patient herself had called earlier asking about cognitive testing.  and she had seemed confused.

## 2020-09-14 NOTE — Telephone Encounter (Signed)
Left message on machine to call back  

## 2020-09-15 DIAGNOSIS — G4733 Obstructive sleep apnea (adult) (pediatric): Secondary | ICD-10-CM | POA: Diagnosis not present

## 2020-09-15 NOTE — Telephone Encounter (Signed)
No call back from patient.  Will keep appt for Thursday with Paz.

## 2020-09-16 ENCOUNTER — Encounter: Payer: Self-pay | Admitting: Internal Medicine

## 2020-09-16 ENCOUNTER — Ambulatory Visit (INDEPENDENT_AMBULATORY_CARE_PROVIDER_SITE_OTHER): Payer: PPO | Admitting: Internal Medicine

## 2020-09-16 ENCOUNTER — Ambulatory Visit (HOSPITAL_BASED_OUTPATIENT_CLINIC_OR_DEPARTMENT_OTHER)
Admission: RE | Admit: 2020-09-16 | Discharge: 2020-09-16 | Disposition: A | Discharge status: Home / Self Care | Payer: PPO | Source: Ambulatory Visit | Attending: Internal Medicine | Admitting: Internal Medicine

## 2020-09-16 ENCOUNTER — Other Ambulatory Visit: Payer: Self-pay

## 2020-09-16 VITALS — BP 164/70 | HR 64 | Temp 98.1°F | Resp 16 | Ht 66.0 in | Wt 164.4 lb

## 2020-09-16 DIAGNOSIS — R410 Disorientation, unspecified: Secondary | ICD-10-CM | POA: Diagnosis not present

## 2020-09-16 DIAGNOSIS — F0390 Unspecified dementia without behavioral disturbance: Secondary | ICD-10-CM | POA: Diagnosis not present

## 2020-09-16 DIAGNOSIS — R4182 Altered mental status, unspecified: Secondary | ICD-10-CM | POA: Diagnosis not present

## 2020-09-16 LAB — COMPREHENSIVE METABOLIC PANEL
ALT: 12 U/L (ref 0–35)
AST: 16 U/L (ref 0–37)
Albumin: 4.3 g/dL (ref 3.5–5.2)
Alkaline Phosphatase: 106 U/L (ref 39–117)
BUN: 12 mg/dL (ref 6–23)
CO2: 26 mEq/L (ref 19–32)
Calcium: 9.6 mg/dL (ref 8.4–10.5)
Chloride: 104 mEq/L (ref 96–112)
Creatinine, Ser: 1.08 mg/dL (ref 0.40–1.20)
GFR: 50.54 mL/min — ABNORMAL LOW (ref >60.00)
Glucose, Bld: 103 mg/dL — ABNORMAL HIGH (ref 70–99)
Potassium: 4.2 mEq/L (ref 3.5–5.1)
Sodium: 139 mEq/L (ref 135–145)
Total Bilirubin: 0.7 mg/dL (ref 0.2–1.2)
Total Protein: 7.5 g/dL (ref 6.0–8.3)

## 2020-09-16 LAB — CBC WITH DIFFERENTIAL/PLATELET
Basophils Absolute: 0.1 10*3/uL (ref 0.0–0.1)
Basophils Relative: 2.4 % (ref 0.0–3.0)
Eosinophils Absolute: 0.2 10*3/uL (ref 0.0–0.7)
Eosinophils Relative: 4.4 % (ref 0.0–5.0)
HCT: 34.6 % — ABNORMAL LOW (ref 36.0–46.0)
Hemoglobin: 11.2 g/dL — ABNORMAL LOW (ref 12.0–15.0)
Lymphocytes Relative: 23.9 % (ref 12.0–46.0)
Lymphs Abs: 1.2 10*3/uL (ref 0.7–4.0)
MCHC: 32.2 g/dL (ref 30.0–36.0)
MCV: 77.2 fl — ABNORMAL LOW (ref 78.0–100.0)
Monocytes Absolute: 0.6 10*3/uL (ref 0.1–1.0)
Monocytes Relative: 12.4 % — ABNORMAL HIGH (ref 3.0–12.0)
Neutro Abs: 2.7 10*3/uL (ref 1.4–7.7)
Neutrophils Relative %: 56.9 % (ref 43.0–77.0)
Platelets: 366 10*3/uL (ref 150.0–400.0)
RBC: 4.49 Mil/uL (ref 3.87–5.11)
RDW: 14.7 % (ref 11.5–15.5)
WBC: 4.8 10*3/uL (ref 4.0–10.5)

## 2020-09-16 LAB — B12 AND FOLATE PANEL
Folate: 13.1 ng/mL (ref >5.9)
Vitamin B-12: 175 pg/mL — ABNORMAL LOW (ref 211–911)

## 2020-09-16 LAB — SEDIMENTATION RATE: Sed Rate: 32 mm/hr — ABNORMAL HIGH (ref 0–30)

## 2020-09-16 NOTE — Patient Instructions (Signed)
Recommend not to drive for now.  Check the  blood pressure daily BP GOAL is between 110/65 and  135/85. If it is consistently higher or lower, let me know  Will refer you to neurology  GO TO THE LAB : Get the blood work     [place], [place] back for a checkup in 3 weeks   STOP BY THE FIRST FLOOR: Get a CAT scan of your head

## 2020-09-16 NOTE — Progress Notes (Signed)
Subjective:    Patient ID: Erika [name], female    DOB: 01-01-1946, 75 y.o.   MRN: [id number]  DOS:  09/16/2020 Type of visit - description: Acute Here with her husband and daughter. They are concerned about her mental status. Symptoms a started approximately 2 months ago, sxs did not  start acutely. + Decreased memory, confusion, difficulty naming and finding words. She is able to recognize everybody has not forgot names. No behavioral issues. Not taking any new medications. Symptoms did  increase when she went on vacation around June 11, she got more confused.  No fever chills She had a couple of falls with soft landings in the sand at the beach.  No head injury that they can tell. No nausea vomiting or diarrhea No paresthesias Patient denies anxiety or depression No headache, dizziness, diplopia, slurred speech.   BP Readings from Last 3 Encounters:  09/16/20 (!) 164/70  07/16/20 135/65  06/14/20 (!) 137/59     Review of Systems See above   Past Medical History:  Diagnosis Date   Allergic state 06/24/2012   Anterior cervical lymphadenopathy 02/08/2013   Arthritis 04/02/2011   Bronchitis 04/02/2011   Chicken pox as a child   Diverticulosis of colon    colonoscopy 04/14/1999   GERD (gastroesophageal reflux disease)    History of fibrocystic disease of breast    Hyperlipidemia, mixed 02/20/2007   Followed as Primary Care Patient/ [facility]/ [facility]  Goal LDL < 130 HBP/Pos fm hx       Hypertension    on 4 medications for HTN   Hypokalemia 06/24/2012   Hyponatremia 08/02/2014   IBS (irritable bowel syndrome) 04/02/2011   Measles as a child   Mumps as a child   Obesity    Osteopenia    Osteopenia after menopause    Overactive bladder 04/02/2011   Overweight    Preventative health care 03/13/2012   Shoulder pain, bilateral 06/14/2011   Sleep apnea    uses cpap    Past Surgical History:  Procedure Laterality Date   BREAST REDUCTION SURGERY Bilateral 1986    BUNIONECTOMY Bilateral 1980   CATARACT EXTRACTION, BILATERAL     COLONOSCOPY  2011   HAMMER TOE SURGERY Right 12/03/2013   foot   MOUTH SURGERY  01/10/2013   negative   TONSILLECTOMY     UPPER GASTROINTESTINAL ENDOSCOPY  2016   RK    Allergies as of 09/16/2020   No Known Allergies      Medication List        Accurate as of September 16, 2020 11:59 PM. If you have any questions, ask your nurse or doctor.          amLODipine 10 MG tablet Commonly known as: NORVASC TAKE 0.5 TABLETS BY MOUTH 2 TIMES DAILY.   aspirin EC 81 MG tablet Take 1 tablet (81 mg total) by mouth daily.   calcium carbonate 500 MG chewable tablet Commonly known as: TUMS - dosed in mg elemental calcium Chew 1 tablet by mouth daily.   CITRACAL PLUS PO Take 1 tablet by mouth daily.   famotidine 40 MG tablet Commonly known as: PEPCID Take 1 tablet (40 mg total) by mouth at bedtime as needed for heartburn or indigestion.   furosemide 20 MG tablet Commonly known as: LASIX Take 0.5 tablets (10 mg total) by mouth daily as needed for edema. What changed: how much to take   metoprolol succinate 50 MG 24 hr tablet Commonly known as: TOPROL-XL  TAKE 1 TABLET BY MOUTH TWICE A DAY WITH OR IMMEDIATELY FOLLOWING A MEAL   rosuvastatin 20 MG tablet Commonly known as: CRESTOR Take 1 tablet (20 mg total) by mouth daily.   trospium 20 MG tablet Commonly known as: SANCTURA Take 20 mg by mouth daily.   UNABLE TO FIND COGNITIVE TABLET DAILY   valsartan 320 MG tablet Commonly known as: DIOVAN Take 1 tablet (320 mg total) by mouth daily.           Objective:   Physical Exam BP (!) 164/70 (BP Location: Left Arm, Patient Position: Sitting, Cuff Size: Small)   Pulse 64   Temp 98.1 F (36.7 C) (Oral)   Resp 16   Ht 5\' 6"  (1.676 m)   Wt 164 lb 6 oz (74.6 kg)   SpO2 97%   BMI 26.53 kg/m  General:   Well developed, NAD, BMI noted. HEENT:  Normocephalic . Face symmetric, atraumatic Neck: Normal  carotid pulses Lungs:  CTA B Normal respiratory effort, no intercostal retractions, no accessory muscle use. Heart: RRR,  no murmur.  Lower extremities: no pretibial edema bilaterally  Skin: Not pale. Not jaundice Neurologic:  alert & oriented to time, self, knew who the president is, had a difficult time remembering her address.  She remember what she had for breakfast and dinner.Marland Kitchen  Speech: Not fluid, hesitant and having a difficult time finding her words, gait appropriate for age and unassisted Psych--  Behavior appropriate. No anxious or depressed appearing.      Assessment     75 year old female, PMH includes HTN, sleep apnea on CPAP, GERD, IBS, very high calcium coronary score, seen by cardiology 07/16/2020, unable to do cardiac CTA x2, had a negative Myoview stress test 07/22/2020, presents with:  MCI/Dementia: 2 months history of decreased memory, episodes of confusion (particularly when she was in new surroundings like when she went to vacation at the beach) difficulty with naming and word finding. Exam is confirmatory. The patient is aware she is having some trouble. Last brain imaging was MRI on  04/27/2018 , at the time she had  dizziness and headache: No acute, moderate to severe ill small vessel disease. Plan: CT head today, subsequently likely will need a MRI Labs: See orders Refer to neurology for further eval and treatment. The patient's daughter is a Insurance claims handler and is interested in a indeep mental evaluation.  That can be accomplished at the neurology office. No driving for now Asked family to help her with her medications and monitor BPs. Will defer Aricept treatment to PCP and neurology.  RTC 3 weeks   Time spent 40 min w/ pt, her family, reviewing results of tests ordered today   This visit occurred during the SARS-CoV-2 public health emergency.  Safety protocols were in place, including screening questions prior to the visit, additional usage of  staff PPE, and extensive cleaning of exam room while observing appropriate contact time as indicated for disinfecting solutions.

## 2020-09-17 LAB — HOMOCYSTEINE: Homocysteine: 14.7 umol/L — ABNORMAL HIGH (ref <10.4)

## 2020-09-17 LAB — RPR: RPR Ser Ql: NONREACTIVE

## 2020-09-21 ENCOUNTER — Ambulatory Visit (INDEPENDENT_AMBULATORY_CARE_PROVIDER_SITE_OTHER): Payer: PPO | Admitting: *Deleted

## 2020-09-21 ENCOUNTER — Other Ambulatory Visit: Payer: Self-pay

## 2020-09-21 DIAGNOSIS — E538 Deficiency of other specified B group vitamins: Secondary | ICD-10-CM | POA: Diagnosis not present

## 2020-09-21 MED ORDER — CYANOCOBALAMIN 1000 MCG/ML IJ SOLN
1000.0000 ug | Freq: Once | INTRAMUSCULAR | Status: AC
  Administered 2020-09-21: 1000 ug via INTRAMUSCULAR

## 2020-09-21 MED ORDER — CYANOCOBALAMIN 1000 MCG/ML IJ SOLN: 1000.0000 ug | Freq: Once | INTRAMUSCULAR | Status: DC

## 2020-09-21 NOTE — Progress Notes (Signed)
Erika [name] is a 75 y.o. female presents to the office today for b12:1054mcg injections, per physician's orders. Original order: labs 09/16/20 Recommend B12 shot weekly x4 then monthly. b12, 1068mcg,  IM was administered right deltoid today. Patient tolerated injection. Patient due for follow up labs/provider appt: Yes. Date due: 10/15/20, appt made Yes Patient next injection due: 09/28/20, appt made Yes  [name]

## 2020-09-27 NOTE — Progress Notes (Signed)
Erika [name] is a 75 y.o. female presents to the office today for her 2nd weekly b12:1060mg injection, per physician's orders. Original order: labs 09/16/20 Recommend B12 shot weekly x4 then monthly. b12, 10066m,  IM was administered R Deltoid today. Patient tolerated injection. Patient due for follow up labs/provider appt: Yes. Date due: 10/15/20, appt made Pending  Patient next injection due: 1 week , appt made Yes- 10/05/20 at 10:30 am

## 2020-09-28 ENCOUNTER — Ambulatory Visit (INDEPENDENT_AMBULATORY_CARE_PROVIDER_SITE_OTHER): Payer: PPO

## 2020-09-28 ENCOUNTER — Other Ambulatory Visit: Payer: Self-pay

## 2020-09-28 DIAGNOSIS — E538 Deficiency of other specified B group vitamins: Secondary | ICD-10-CM | POA: Diagnosis not present

## 2020-09-28 MED ORDER — CYANOCOBALAMIN 1000 MCG/ML IJ SOLN
1000.0000 ug | Freq: Once | INTRAMUSCULAR | Status: AC
  Administered 2020-09-28: 1000 ug via INTRAMUSCULAR

## 2020-09-30 ENCOUNTER — Other Ambulatory Visit: Payer: Self-pay

## 2020-09-30 ENCOUNTER — Encounter: Payer: Self-pay | Admitting: Neurology

## 2020-09-30 ENCOUNTER — Ambulatory Visit: Payer: PPO | Admitting: Neurology

## 2020-09-30 VITALS — BP 188/72 | HR 62 | Ht 66.0 in | Wt 168.0 lb

## 2020-09-30 DIAGNOSIS — R413 Other amnesia: Secondary | ICD-10-CM

## 2020-09-30 DIAGNOSIS — E538 Deficiency of other specified B group vitamins: Secondary | ICD-10-CM | POA: Diagnosis not present

## 2020-09-30 NOTE — Progress Notes (Signed)
Subjective:    Patient ID: Erika [name] is a 75 y.o. female.  HPI    Erika Age, MD, [name] [facility] [address] [place], [place] [address]  Dear Dr. Larose [name],    I saw your patient, Erika [name], upon your kind request in my neurologic clinic today for initial consultation of her Confusion and memory loss.  Erika patient is accompanied by her older daughter, Erika [name], today.  As you know, Erika [name] is a 75 year old right-handed woman with an underlying medical history of allergies, arthritis, reflux disease, hypertension, hyperlipidemia, irritable bowel syndrome, sleep apnea, mildly overweight state, and vitamin B12 deficiency, who reports problems with her short-term memory for Erika past approximately 6 months.  She is particularly bothered by her word finding difficulty and losing her train of thought.  She has had short-term memory issues including forgetfulness.  Some 6 weeks ago her daughter reports patient had a more sudden decline in memory function, seemed confused, was irritable, and this subsequently subsided.  She does not have any sustained anxiety or depression.  She has recently started B12 injections.  She is not necessarily physically very active other than working in [place]rika yard but daughter is concerned that formal exercise is lacking.  Patient tries to hydrate well with water.  She is a non-smoker, no telltale family history of Alzheimer's dementia or dementia in general.  Daughter recently did a [place] and patient scored 16 out of 54. She has not had any recent falls.  She has had some difficulty using her CPAP machine but does use it regularly. She does not drink any alcohol.  She drinks very little caffeine, likes to drink decaf sweet tea.  She does drink several servings of this per day.  Thus far, she has had a couple of B12 injections as she was found to have low B12 recently.  She has a good psychosocial support system.  She lives  with her husband.  She has 2 biological daughters and 2 adopted [name].  She is very much involved in their families and with her grandchildren.  She likes to travel.  I reviewed your office note from 09/16/2020.  She had blood work at Erika time which I was able to review in Erika chart.  Orthopedic homocystine was mildly elevated, ESR borderline elevated at 32, CMP showed sodium of 139, potassium 4.2, glucose 103, BUN 12, creatinine 1.8, AST 16, ALT 12, CBC with differential showed WBC of 4.8, hemoglobin 11.2, hematocrit 34.6, MCV 77.2, mildly low, RPR nonreactive, B12 low at 175, folate normal at 13.1.  She was started on B12 injections. You ordered a [place]. 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   No acute intracranial abnormality.      I had previously evaluated her about 2-1/2 years ago for recurrent headaches and left-sided weakness.  She had improved by Erika time I saw her.  She was in Erika process of being evaluated for sleep apnea.  I ordered a brain MRI.  She had a brain MRI without contrast on 04/27/2018 and I reviewed Erika results:  IMPRESSION: 1. No acute intracranial process. 2. Moderate to severe chronic small vessel ischemic changes.  We called her with her test results.  She had interim sleep evaluation with pulmonology. She is on PAP therapy and had a FU with the NP in 03/22.  Previously:   04/23/18:  75 year old right-handed woman with an underlying medical history of arthritis, particularly shoulder pain bilaterally, osteopenia, irritable bowel syndrome, hypertension, hyperlipidemia, reflux disease, diverticulosis, and obesity, who reports episodic headaches in Erika fall of last year and also had episodes of dizziness and some weakness in Erika L hand in December, which lasted maybe for seconds, all of which have improved. She currently feels at baseline. She does not smoke, does not  typically utilize alcohol, drinks caffeine regularly, in Erika form of sweet tea at least 4-5 cups per day and one cup of coffee in Erika morning typically. She admits that she does not typically drink a lot of water. She has not had an MRI brain, as far as I can see. She may have had some difficulty speaking, also for seconds (?). She has a FHx of stroke in her mother. She has been sleepy during Erika day. She has had a sleep evaluation and has sleep test pending. Has been quite busy, retired as Copywriter, advertising some 4 years ago. 2 daughters live next door practically, 2 adopted [name]. She has 6 GC, takes care of 5 of her GC on a daily basis or helps out in one form or another. She has had stressors. She has had some memory issues.   I reviewed your office note from 02/11/2018. 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   No flow-limiting intracranial stenosis is evident. She is in Erika process of being evaluated for sleep apnea.   Her Past Medical History Is Significant For: Past Medical History:  Diagnosis Date   Allergic state 06/24/2012   Anterior cervical lymphadenopathy 02/08/2013   Arthritis 04/02/2011   Bronchitis 04/02/2011   Chicken pox as a child   Diverticulosis of colon    colonoscopy 04/14/1999   GERD (gastroesophageal reflux disease)    History of fibrocystic disease of breast    Hyperlipidemia, mixed 02/20/2007   Followed as Primary Care Patient/ [facility]/ [facility]  Goal LDL < 130 HBP/Pos fm hx       Hypertension    on 4 medications for HTN   Hypokalemia 06/24/2012   Hyponatremia 08/02/2014   IBS (irritable bowel syndrome) 04/02/2011   Measles as a child   Mumps as a child   Obesity    Osteopenia    Osteopenia after menopause     Overactive bladder 04/02/2011   Overweight    Preventative health care 03/13/2012   Shoulder pain, bilateral 06/14/2011   Sleep apnea    uses cpap    Her Past Surgical History Is Significant For: Past Surgical History:  Procedure Laterality Date   BREAST REDUCTION SURGERY Bilateral 1986   BUNIONECTOMY Bilateral 1980   CATARACT EXTRACTION, BILATERAL     COLONOSCOPY  2011   HAMMER TOE SURGERY Right 12/03/2013   foot   MOUTH SURGERY  01/10/2013   negative   TONSILLECTOMY     UPPER GASTROINTESTINAL ENDOSCOPY  2016   RK    Her Family History Is Significant For: Family History  Problem Relation [name] of Onset   Stroke Mother 41   Hyperlipidemia Mother    Hypertension Mother    Heart attack Father 60   Hypertension Father    Heart disease Father    Hypertension Brother    Aneurysm Brother  aortic   Hyperlipidemia Brother    Heart disease Brother        aortic aneurysm rupture   Multiple sclerosis Daughter    Alcohol abuse Son    Schizophrenia Son    Bipolar disorder Son    Mental illness Son        schizophrenic, bipolar   Colon cancer Neg Hx    Colon polyps Neg Hx    Esophageal cancer Neg Hx    Rectal cancer Neg Hx    Stomach cancer Neg Hx    Alzheimer's disease Neg Hx     Her Social History Is Significant For: Social History   Socioeconomic History   Marital status: Married    Spouse name: Not on file   Number of children: 4   Years of education: Not on file   Highest education level: Associate degree: academic program  Occupational History   Occupation: dental hygenist  Tobacco Use   Smoking status: Never   Smokeless tobacco: Never  Vaping Use   Vaping Use: Never used  Substance and Sexual Activity   Alcohol use: No   Drug use: No   Sexual activity: Not Currently  Other Topics Concern   Not on file  Social History Narrative   4 children 2 are adopted      Lives at home with husband, children live closeby    Right handed   Caffeine: maybe  1-3 cups/day   Social Determinants of Health   Financial Resource Strain: Not on file  Food Insecurity: Not on file  Transportation Needs: Not on file  Physical Activity: Not on file  Stress: Not on file  Social Connections: Not on file    Her Allergies Are:  No Known Allergies:   Her Current Medications Are:  Outpatient Encounter Medications as of 09/30/2020  Medication Sig   amLODipine (NORVASC) 10 MG tablet TAKE 0.5 TABLETS BY MOUTH 2 TIMES DAILY.   aspirin EC 81 MG tablet Take 1 tablet (81 mg total) by mouth daily.   calcium carbonate (TUMS - DOSED IN MG ELEMENTAL CALCIUM) 500 MG chewable tablet Chew 1 tablet by mouth daily.   famotidine (PEPCID) 40 MG tablet Take 1 tablet (40 mg total) by mouth at bedtime as needed for heartburn or indigestion.   furosemide (LASIX) 20 MG tablet Take 0.5 tablets (10 mg total) by mouth daily as needed for edema. (Patient taking differently: Take 20 mg by mouth daily as needed for edema.)   metoprolol succinate (TOPROL-XL) 50 MG 24 hr tablet TAKE 1 TABLET BY MOUTH TWICE A DAY WITH OR IMMEDIATELY FOLLOWING A MEAL   Multiple Minerals-Vitamins (CITRACAL PLUS PO) Take 1 tablet by mouth daily.   rosuvastatin (CRESTOR) 20 MG tablet Take 1 tablet (20 mg total) by mouth daily.   trospium (SANCTURA) 20 MG tablet Take 20 mg by mouth daily.   UNABLE TO FIND COGNITIVE TABLET DAILY   valsartan (DIOVAN) 320 MG tablet Take 1 tablet (320 mg total) by mouth daily.   Facility-Administered Encounter Medications as of 09/30/2020  Medication   regadenoson (LEXISCAN) injection SOLN 0.4 mg  :   Review of Systems:  Out of a complete 14 point review of systems, all are reviewed and negative with Erika exception of these symptoms as listed below:  Review of Systems  Neurological:        Patient is here for evaluation of acute confusion. Patient [place] first [place]. She is not sure  what made her mad but she states she was ugly,  threatening. Erika [name]'s daughter stated that mid-week she started taking Erika sheets off Erika bed, thought it was time to leave vacation. Daughter states there has been subtle signs however for Erika past 6 months. For example, patient sends out cards to people but Erika folks don't know what she is saying or she forgets to sign. She has had dates mixed up, attention and focus issues, difficulty making it through a thought/sentence. Her daughter is a therapist and completed a [place] last night, scored 16/30.    Objective:  Neurological Exam  Physical Exam Physical Examination:   Vitals:   09/30/20 1107  BP: (!) 188/72  Pulse: 62   General Examination: Erika patient is a very pleasant 75 y.o. female in no acute distress. She appears well-developed and well-nourished and well groomed.   HEENT: Normocephalic, atraumatic, pupils are equal, round and reactive to light, extraocular tracking is well-preserved, hearing is grossly intact, face is symmetric with normal facial animation, speech is clear, neck is supple, no carotid bruits.  Airway examination reveals mild mouth dryness, tongue protrudes centrally and palate elevates symmetrically.     Chest: Clear to auscultation without wheezing, rhonchi or crackles noted.   Heart: S1+S2+0, regular and normal without murmurs, rubs or gallops noted.   Abdomen: Soft, non-tender and non-distended with normal bowel sounds appreciated on auscultation.   Extremities: There is no pitting edema in Erika distal lower extremities bilaterally. Pedal pulses are intact.   Skin: Warm and dry without trophic changes noted.   Musculoskeletal: exam reveals prominent arthritic changes in both hands.   Neurologically: Mental status: Erika patient is awake, alert and pays good attention, she is able to provide her history fairly well with details provided by her daughter.   Speech is clear with normal prosody and enunciation. Thought process is linear. Mood is normal and affect is  normal.  Some word finding difficulty noted.  MMSE - Mini Mental State Exam 09/30/2020 10/30/2017 08/07/2016  Orientation to time _0 Orientation to Place _1 Registration _2 Attention/ Calculation 0 5 5  Recall 0 1 3  Language- name 2 objects _3 Language- repeat _4 Language- follow 3 step command _5 Language- read & follow direction 0 1 1  Write a sentence _6 Copy design 0 1 1  Total score _7 On 09/30/2020: CDT: 4/4, AFT: 13/min.  Cranial nerves II - XII are as described above under HEENT exam. In addition: shoulder shrug is normal with equal shoulder height noted. Motor exam: Normal bulk, strength and tone is noted. There is no tremor. Reflexes are 1+ throughout. Fine motor skills and coordination: intact grossly.  Cerebellar testing: No dysmetria or intention tremor. There is no truncal or gait ataxia. Sensory exam: intact to light touch. Gait, station and balance: She stands easily. No veering to one side is noted. No leaning to one side is noted. Posture is [name]-appropriate and stance is narrow based. Gait shows normal stride length and normal pace. No problems turning are noted.  Assessment and Plan:    In summary, Erika [name] is a very pleasant 75 y.o.-year old female with an underlying medical history of allergies, arthritis, reflux disease, hypertension, hyperlipidemia, irritable bowel syndrome, sleep apnea, mildly overweight state, and vitamin B12 deficiency, who presents for evaluation of her memory loss and more  recent concern for confusion.  She does not have a strong family history of dementia or Alzheimer's disease but does have vascular risk factors.  We talked about her memory score today which is in Erika mild to moderately abnormal range but she was also recently diagnosed with vitamin B12 deficiency which likely plays a role in her cognitive function.  We talked about supportive treatments and pharmacological options in Erika near future.  I  would like to proceed with a brain MRI for comparison with her study from 2-1/2 years ago.  We will also request formal in-depth cognitive evaluation with Erika help of a neuropsychologist.  I explained this to her and Erika daughter is also supportive.  We we will resume our discussion about utilizing medication for memory loss in Erika near future but for now I would like for her to continue with her vitamin B12 injections through your office, and follow-up with her regular PCP as scheduled.  We talked about Erika importance of healthy lifestyle, good nutrition, good hydration with water, avoiding sugary drinks.  She is furthermore advised to try to exercise on a regular basis and continue to work on weight loss.  She has lost about 5 to 8 pounds in Erika past 6 months by self-report.  She denies any mood related issues, no sustained anxiety or depression and daughter is also not concerned about any underlying mood disorder at this point.  We will plan a follow-up in about 3 months, in Erika interim we will call her with her MRI results.  I also placed a referral to neuropsychology.  I answered all their questions today and Erika patient and her daughter were in agreement. Thank you very much for allowing me to participate in Erika care of this nice patient. If I can be of any further assistance to you please do not hesitate to call me at [phone].   Sincerely,     Erika Age, MD, [name]

## 2020-09-30 NOTE — Patient Instructions (Signed)
You have complaints of memory loss: memory loss or changes in cognitive function can have many reasons and does not always mean you have dementia. Conditions that can contribute to subjective or objective memory loss include: depression, stress, poor sleep from insomnia or sleep apnea, dehydration, fluctuation in blood sugar values, thyroid or electrolyte dysfunction and certain vitamin deficiencies. Dementia can be caused by stroke, brain atherosclerosis or brain vascular disease due to vascular risk factors (smoking, high blood pressure, high cholesterol, obesity and uncontrolled diabetes), certain degenerative brain disorders (including Parkinson's disease and Multiple sclerosis) and by Alzheimer's disease or other, more rare and sometimes hereditary causes. We will do some additional testing:   You have already had recent blood work.  We will do a brain scan, called MRI and call you with the test results. We will have to schedule you for this on a separate date. This test requires authorization from your insurance, and we will take care of the insurance process. We will also request a formal cognitive test called neuropsychological evaluation which is done by a licensed neuropsychologist. We will make a referral in that regard.  We will call you with brain scan test results and monitor your symptoms.  We will consider medication for memory loss.  Your family history does not suggest a high risk for Alzheimer's dementia but you do have what we call vascular risk factors.  Please continue to use your CPAP or AutoPap machine regularly and keep your follow-up with your pulmonology sleep specialist.   Please try to exercise on a regular basis and maintain a healthy lifestyle including good nutrition and good hydration with water.

## 2020-10-04 ENCOUNTER — Encounter: Payer: Self-pay | Admitting: Psychology

## 2020-10-04 ENCOUNTER — Other Ambulatory Visit: Payer: Self-pay

## 2020-10-04 ENCOUNTER — Ambulatory Visit
Admission: RE | Admit: 2020-10-04 | Discharge: 2020-10-04 | Disposition: A | Discharge status: Home / Self Care | Payer: PPO | Source: Ambulatory Visit | Attending: Neurology | Admitting: Neurology

## 2020-10-04 DIAGNOSIS — E538 Deficiency of other specified B group vitamins: Secondary | ICD-10-CM | POA: Diagnosis not present

## 2020-10-04 DIAGNOSIS — R413 Other amnesia: Secondary | ICD-10-CM | POA: Diagnosis not present

## 2020-10-04 DIAGNOSIS — I6782 Cerebral ischemia: Secondary | ICD-10-CM | POA: Diagnosis not present

## 2020-10-05 ENCOUNTER — Telehealth: Payer: Self-pay | Admitting: *Deleted

## 2020-10-05 ENCOUNTER — Ambulatory Visit (INDEPENDENT_AMBULATORY_CARE_PROVIDER_SITE_OTHER): Payer: PPO

## 2020-10-05 DIAGNOSIS — E538 Deficiency of other specified B group vitamins: Secondary | ICD-10-CM

## 2020-10-05 MED ORDER — CYANOCOBALAMIN 1000 MCG/ML IJ SOLN
1000.0000 ug | Freq: Once | INTRAMUSCULAR | Status: AC
  Administered 2020-10-05: 1000 ug via INTRAMUSCULAR

## 2020-10-05 NOTE — Telephone Encounter (Signed)
-----   Message from [name], MD sent at 10/04/2020  5:10 PM EDT ----- Please call patient or her daughter about her brain MRI.  Her MRI brain without contrast from 10/04/2020 showed no acute findings, no significant volume loss was reported.  She does have changes in the deep structures of the brain in keeping with chronic hardening of the arteries.  These changes are considered moderate in her case.  These are not acute or specific changes and occur with time and are seen in a variety of conditions, including with normal aging, chronic hypertension, chronic headaches, especially migraine HAs, chronic diabetes, chronic hyperlipidemia. These are not strokes and no mass or lesion or contrast enhancement was seen which is reassuring. Again, there were no acute findings, such as a stroke, or mass or blood products. No further action is required on this test at this time, other than re-enforcing the importance of good blood pressure control, good cholesterol control, good blood sugar control, and weight management. Please remind patient to keep any upcoming appointments or tests and to call us with any interim questions, concerns, problems or updates. Thanks,  [name], MD, [name]

## 2020-10-05 NOTE — Telephone Encounter (Signed)
Spoke to pt, relayed the MRI results.  She verbalized understanding.  Answered questions.  She will call [facility]ack as needed.

## 2020-10-05 NOTE — Progress Notes (Signed)
Pt here for weekly B12 injection per Dr. Charlett Blake Cyanocobalamin 1,000 mcg given in right deltoid IM.  pt tolerated injection well.  Next B12 injection scheduled for next week at [facility] with PCP.   Routed to DOD in absence of PCP.

## 2020-10-07 ENCOUNTER — Ambulatory Visit: Payer: PPO | Admitting: Internal Medicine

## 2020-10-14 ENCOUNTER — Ambulatory Visit: Payer: PPO | Admitting: Family Medicine

## 2020-10-15 ENCOUNTER — Other Ambulatory Visit: Payer: Self-pay

## 2020-10-15 ENCOUNTER — Ambulatory Visit (INDEPENDENT_AMBULATORY_CARE_PROVIDER_SITE_OTHER): Payer: PPO | Admitting: Family Medicine

## 2020-10-15 ENCOUNTER — Encounter: Payer: Self-pay | Admitting: Family Medicine

## 2020-10-15 VITALS — BP 124/60 | HR 58 | Temp 98.0°F | Resp 16 | Ht 66.0 in | Wt 168.4 lb

## 2020-10-15 DIAGNOSIS — I10 Essential (primary) hypertension: Secondary | ICD-10-CM

## 2020-10-15 DIAGNOSIS — D649 Anemia, unspecified: Secondary | ICD-10-CM

## 2020-10-15 DIAGNOSIS — G3184 Mild cognitive impairment, so stated: Secondary | ICD-10-CM

## 2020-10-15 DIAGNOSIS — E538 Deficiency of other specified B group vitamins: Secondary | ICD-10-CM

## 2020-10-15 DIAGNOSIS — R7989 Other specified abnormal findings of blood chemistry: Secondary | ICD-10-CM

## 2020-10-15 LAB — CBC WITH DIFFERENTIAL/PLATELET
Basophils Absolute: 0.1 10*3/uL (ref 0.0–0.1)
Basophils Relative: 1.8 % (ref 0.0–3.0)
Eosinophils Absolute: 0.3 10*3/uL (ref 0.0–0.7)
Eosinophils Relative: 4.6 % (ref 0.0–5.0)
HCT: 35 % — ABNORMAL LOW (ref 36.0–46.0)
Hemoglobin: 11.1 g/dL — ABNORMAL LOW (ref 12.0–15.0)
Lymphocytes Relative: 29.7 % (ref 12.0–46.0)
Lymphs Abs: 1.8 10*3/uL (ref 0.7–4.0)
MCHC: 31.6 g/dL (ref 30.0–36.0)
MCV: 75.7 fl — ABNORMAL LOW (ref 78.0–100.0)
Monocytes Absolute: 0.7 10*3/uL (ref 0.1–1.0)
Monocytes Relative: 11.3 % (ref 3.0–12.0)
Neutro Abs: 3.2 10*3/uL (ref 1.4–7.7)
Neutrophils Relative %: 52.6 % (ref 43.0–77.0)
Platelets: 328 10*3/uL (ref 150.0–400.0)
RBC: 4.62 Mil/uL (ref 3.87–5.11)
RDW: 15.3 % (ref 11.5–15.5)
WBC: 6.2 10*3/uL (ref 4.0–10.5)

## 2020-10-15 MED ORDER — CYANOCOBALAMIN 1000 MCG/ML IJ SOLN
1000.0000 ug | Freq: Once | INTRAMUSCULAR | Status: AC
  Administered 2020-10-15: 1000 ug via INTRAMUSCULAR

## 2020-10-15 NOTE — Patient Instructions (Signed)
Fluoxetine/Prozac helps to manage the stress, anxiety, depression than can accompany memory loss and health concerns.Memory Compensation Strategies  Use "WARM" strategy.  W= write it down  A= associate it  R= repeat it  M= make a mental note  2.   You can keep a Social worker.  Use a 3-ring notebook with sections for the following: calendar, important names and phone numbers,  medications, doctors' names/phone numbers, lists/reminders, and a section to journal what you did  each day.   3.    Use a calendar to write appointments down.  4.    Write yourself a schedule for the day.  This can be placed on the calendar or in a separate section of the Memory Notebook.  Keeping a  regular schedule can help memory.  5.    Use medication organizer with sections for each day or morning/evening pills.  You may need help loading it  6.    Keep a basket, or pegboard by the door.  Place items that you need to take out with you in the basket or on the pegboard.  You may also want to  include a message board for reminders.  7.    Use sticky notes.  Place sticky notes with reminders in a place where the task is performed.  For example: " turn off the  stove" placed by the stove, "lock the door" placed on the door at eye level, " take your medications" on  the bathroom mirror or by the place where you normally take your medications.  8.    Use alarms/timers.  Use while cooking to remind yourself to check on food or as a reminder to take your medicine, or as a  reminder to make a call, or as a reminder to perform another task, etc.

## 2020-10-16 DIAGNOSIS — E538 Deficiency of other specified B group vitamins: Secondary | ICD-10-CM | POA: Insufficient documentation

## 2020-10-16 DIAGNOSIS — D649 Anemia, unspecified: Secondary | ICD-10-CM | POA: Insufficient documentation

## 2020-10-16 DIAGNOSIS — G4733 Obstructive sleep apnea (adult) (pediatric): Secondary | ICD-10-CM | POA: Diagnosis not present

## 2020-10-16 HISTORY — DX: Deficiency of other specified B group vitamins: E53.8

## 2020-10-16 NOTE — Assessment & Plan Note (Signed)
She is accompanied by her husband and tearful, irritable and anxious about her memory concerns. She has word finding trouble. Is easily flustered and irritable when the environment gets loud or hectic. 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 Her husband notes he thinks she has been messing her medications and he has just taken them over. She has been seen by neurology and they have completed an MRI and now they are awaiting neuropsych testing to further define her diagnosis. She is not to drive and she is encouraged to keep letting her husband do her meds. She is encouraged to stop teaching and to try to learn something new. Stay as active as able and eat a low inflammation diet.

## 2020-10-16 NOTE — Assessment & Plan Note (Signed)
Well controlled, no changes to meds. Encouraged heart healthy diet such as the DASH diet and exercise as tolerated.  °

## 2020-10-16 NOTE — Assessment & Plan Note (Signed)
She is tolerating her shots and is given one today. Next shot in 2 weeks then another shot two weeks after that. After that once monthly and monitor

## 2020-10-16 NOTE — Progress Notes (Signed)
Subjective:    Patient ID: Erika [name], female    DOB: 1945-07-06, 75 y.o.   MRN: [id number]  Chief Complaint  Patient presents with   3 MONTHS F/U    HPI Patient is in today for follow up on memory concerns, vitamin B 12 deficiency and more. No recent febrile illness or hospitalization. She is tolerating her shots. She is accompanied by her husband and tearful, irritable and anxious about her memory concerns. She has word finding trouble. Is easily flustered and irritable when the environment gets loud or hectic. 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 Her husband notes he thinks she has been messing her medications and he has just taken them over. She has been seen by neurology and they have completed an MRI and now they are awaiting neuropsych testing to further define her diagnosis. Denies CP/palp/SOB/HA/congestion/fevers/GI or GU c/o. Taking meds as prescribed   Past Medical History:  Diagnosis Date   Allergic state 06/24/2012   Anterior cervical lymphadenopathy 02/08/2013   Arthritis 04/02/2011   Bronchitis 04/02/2011   Chicken pox as a child   Diverticulosis of colon    colonoscopy 04/14/1999   GERD (gastroesophageal reflux disease)    History of fibrocystic disease of breast    Hyperlipidemia, mixed 02/20/2007   Followed as Primary Care Patient/ [facility]/ [facility]  Goal LDL < 130 HBP/Pos fm hx       Hypertension    on 4 medications for HTN   Hypokalemia 06/24/2012   Hyponatremia 08/02/2014   IBS (irritable bowel syndrome) 04/02/2011   Measles as a child   Mumps as a child   Obesity    Osteopenia    Osteopenia after menopause    Overactive bladder 04/02/2011   Overweight    Preventative health care 03/13/2012   Shoulder pain, bilateral 06/14/2011   Sleep apnea    uses cpap    Past Surgical History:  Procedure Laterality Date   BREAST REDUCTION SURGERY Bilateral 1986   BUNIONECTOMY Bilateral 1980    CATARACT EXTRACTION, BILATERAL     COLONOSCOPY  2011   HAMMER TOE SURGERY Right 12/03/2013   foot   MOUTH SURGERY  01/10/2013   negative   TONSILLECTOMY     UPPER GASTROINTESTINAL ENDOSCOPY  2016   [facility]    Family History  Problem Relation Age of Onset   Stroke Mother 70   Hyperlipidemia Mother    Hypertension Mother    Heart attack Father 31   Hypertension Father    Heart disease Father    Hypertension Brother    Aneurysm Brother        aortic   Hyperlipidemia Brother    Heart disease Brother        aortic aneurysm rupture   Multiple sclerosis Daughter    Alcohol abuse Son    Schizophrenia Son    Bipolar disorder Son    Mental illness Son        schizophrenic, bipolar   Colon cancer Neg Hx    Colon polyps Neg Hx    Esophageal cancer Neg Hx    Rectal cancer Neg Hx    Stomach cancer Neg Hx    Alzheimer's disease Neg Hx     Social History   Socioeconomic History   Marital status: Married    Spouse name: Not on file   Number of children: 4   Years of education: Not on file  Highest education level: Associate degree: academic program  Occupational History   Occupation: dental hygenist  Tobacco Use   Smoking status: Never   Smokeless tobacco: Never  Vaping Use   Vaping Use: Never used  Substance and Sexual Activity   Alcohol use: No   Drug use: No   Sexual activity: Not Currently  Other Topics Concern   Not on file  Social History Narrative   4 children 2 are adopted      Lives at home with husband, children live closeby    Right handed   Caffeine: maybe 1-3 cups/day   Social Determinants of Health   Financial Resource Strain: Not on file  Food Insecurity: Not on file  Transportation Needs: Not on file  Physical Activity: Not on file  Stress: Not on file  Social Connections: Not on file  Intimate Partner Violence: Not on file    Outpatient Medications Prior to Visit  Medication Sig Dispense Refill   amLODipine (NORVASC) 10 MG tablet TAKE 0.5  TABLETS BY MOUTH 2 TIMES DAILY. 90 tablet 1   aspirin EC 81 MG tablet Take 1 tablet (81 mg total) by mouth daily.     calcium carbonate (TUMS - DOSED IN MG ELEMENTAL CALCIUM) 500 MG chewable tablet Chew 1 tablet by mouth daily.     famotidine (PEPCID) 40 MG tablet Take 1 tablet (40 mg total) by mouth at bedtime as needed for heartburn or indigestion. 90 tablet 3   furosemide (LASIX) 20 MG tablet Take 0.5 tablets (10 mg total) by mouth daily as needed for edema. (Patient taking differently: Take 20 mg by mouth daily as needed for edema.) 45 tablet 1   metoprolol succinate (TOPROL-XL) 50 MG 24 hr tablet TAKE 1 TABLET BY MOUTH TWICE A DAY WITH OR IMMEDIATELY FOLLOWING A MEAL 180 tablet 2   Multiple Minerals-Vitamins (CITRACAL PLUS PO) Take 1 tablet by mouth daily.     rosuvastatin (CRESTOR) 20 MG tablet Take 1 tablet (20 mg total) by mouth daily. 90 tablet 3   trospium (SANCTURA) 20 MG tablet Take 20 mg by mouth daily.     UNABLE TO FIND COGNITIVE TABLET DAILY     valsartan (DIOVAN) 320 MG tablet Take 1 tablet (320 mg total) by mouth daily. 90 tablet 1   regadenoson (LEXISCAN) injection SOLN 0.4 mg      No facility-administered medications prior to visit.    No Known Allergies  Review of Systems  Constitutional:  Positive for malaise/fatigue. Negative for fever.  HENT:  Negative for congestion.   Eyes:  Negative for blurred vision.  Respiratory:  Negative for shortness of breath.   Cardiovascular:  Negative for chest pain, palpitations and leg swelling.  Gastrointestinal:  Negative for abdominal pain, blood in stool and nausea.  Genitourinary:  Negative for dysuria and frequency.  Musculoskeletal:  Negative for falls.  Skin:  Negative for rash.  Neurological:  Negative for dizziness, loss of consciousness and headaches.  Endo/Heme/Allergies:  Negative for environmental allergies.  Psychiatric/Behavioral:  Positive for depression and memory loss. The patient is nervous/anxious.        Objective:    Physical Exam Constitutional:      General: She is not in acute distress.    Appearance: Normal appearance. She is not ill-appearing or toxic-appearing.  HENT:     Head: Normocephalic and atraumatic.     Right Ear: External ear normal.     Left Ear: External ear normal.     Nose: Nose normal.  Eyes:     General:        Right eye: No discharge.        Left eye: No discharge.  Pulmonary:     Effort: Pulmonary effort is normal.  Skin:    Findings: No rash.  Neurological:     Mental Status: She is alert and oriented to person, place, and time.  Psychiatric:        Behavior: Behavior normal.    BP 124/60   Pulse (!) 58   Temp 98 F (36.7 C)   Resp 16   Ht '5\' 6"'$  (1.676 m)   Wt 168 lb 6.4 oz (76.4 kg)   SpO2 99%   BMI 27.18 kg/m  Wt Readings from Last 3 Encounters:  10/15/20 168 lb 6.4 oz (76.4 kg)  09/30/20 168 lb (76.2 kg)  09/16/20 164 lb 6 oz (74.6 kg)    Diabetic Foot Exam - Simple   No data filed    Lab Results  Component Value Date   WBC 6.2 10/15/2020   HGB 11.1 (L) 10/15/2020   HCT 35.0 (L) 10/15/2020   PLT 328.0 10/15/2020   GLUCOSE 103 (H) 09/16/2020   CHOL 204 (H) 05/20/2020   TRIG 124.0 05/20/2020   HDL 46.10 05/20/2020   LDLDIRECT 163.0 07/24/2014   LDLCALC 133 (H) 05/20/2020   ALT 12 09/16/2020   AST 16 09/16/2020   NA 139 09/16/2020   K 4.2 09/16/2020   CL 104 09/16/2020   CREATININE 1.08 09/16/2020   BUN 12 09/16/2020   CO2 26 09/16/2020   TSH 0.75 05/20/2020    Lab Results  Component Value Date   TSH 0.75 05/20/2020   Lab Results  Component Value Date   WBC 6.2 10/15/2020   HGB 11.1 (L) 10/15/2020   HCT 35.0 (L) 10/15/2020   MCV 75.7 (L) 10/15/2020   PLT 328.0 10/15/2020   Lab Results  Component Value Date   NA 139 09/16/2020   K 4.2 09/16/2020   CO2 26 09/16/2020   GLUCOSE 103 (H) 09/16/2020   BUN 12 09/16/2020   CREATININE 1.08 09/16/2020   BILITOT 0.7 09/16/2020   ALKPHOS 106 09/16/2020   AST 16  09/16/2020   ALT 12 09/16/2020   PROT 7.5 09/16/2020   ALBUMIN 4.3 09/16/2020   CALCIUM 9.6 09/16/2020   ANIONGAP 9 05/06/2020   GFR 50.54 (L) 09/16/2020   Lab Results  Component Value Date   CHOL 204 (H) 05/20/2020   Lab Results  Component Value Date   HDL 46.10 05/20/2020   Lab Results  Component Value Date   LDLCALC 133 (H) 05/20/2020   Lab Results  Component Value Date   TRIG 124.0 05/20/2020   Lab Results  Component Value Date   CHOLHDL 4 05/20/2020   No results found for: HGBA1C     Assessment & Plan:   Problem List Items Addressed This Visit     Hypertension    Well controlled, no changes to meds. Encouraged heart healthy diet such as the DASH diet and exercise as tolerated.       Low vitamin D level    Supplement and monitor      Mild cognitive impairment    She is accompanied by her husband and tearful, irritable and anxious about her memory concerns. She has word finding trouble. Is easily flustered and irritable when the environment gets loud or hectic. 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 Her husband notes he thinks she has been messing her medications and he has just taken them over. She has been seen by neurology and they have completed an MRI and now they are awaiting neuropsych testing to further define her diagnosis. She is not to drive and she is encouraged to keep letting her husband do her meds. She is encouraged to stop teaching and to try to learn something new. Stay as active as able and eat a low inflammation diet.       Vitamin B 12 deficiency - Primary    She is tolerating her shots and is given one today. Next shot in 2 weeks then another shot two weeks after that. After that once monthly and monitor      Relevant Orders   Intrinsic Factor Antibodies   Anemia   Relevant Orders   CBC with Differential/Platelet (Completed)    I am having Erika [name] maintain her  Multiple Minerals-Vitamins (CITRACAL PLUS PO), trospium, calcium carbonate, aspirin EC, furosemide, famotidine, valsartan, UNABLE TO FIND, rosuvastatin, metoprolol succinate, and amLODipine. We will stop administering regadenoson. Additionally, we administered cyanocobalamin.  Meds ordered this encounter  Medications   cyanocobalamin ((VITAMIN B-12)) injection 1,000 mcg     Penni Homans, MD

## 2020-10-16 NOTE — Assessment & Plan Note (Signed)
Supplement and monitor 

## 2020-10-18 ENCOUNTER — Other Ambulatory Visit: Payer: Self-pay

## 2020-10-18 ENCOUNTER — Other Ambulatory Visit (INDEPENDENT_AMBULATORY_CARE_PROVIDER_SITE_OTHER): Payer: PPO

## 2020-10-18 DIAGNOSIS — D649 Anemia, unspecified: Secondary | ICD-10-CM | POA: Diagnosis not present

## 2020-10-19 ENCOUNTER — Other Ambulatory Visit: Payer: Self-pay

## 2020-10-19 DIAGNOSIS — D649 Anemia, unspecified: Secondary | ICD-10-CM

## 2020-10-19 DIAGNOSIS — D509 Iron deficiency anemia, unspecified: Secondary | ICD-10-CM

## 2020-10-19 MED ORDER — FERROUS FUMARATE 325 (106 FE) MG PO TABS: 1.0000 | ORAL_TABLET | Freq: Every day | ORAL | Status: DC

## 2020-10-19 MED ORDER — FERROUS FUMARATE 325 (106 FE) MG PO TABS: 1.0000 | ORAL_TABLET | Freq: Every day | ORAL | 3 refills | Status: DC

## 2020-10-19 NOTE — Addendum Note (Signed)
Addended by: Randolm Idol A on: 10/19/2020 12:50 PM   Modules accepted: Orders

## 2020-10-22 LAB — FECAL OCCULT BLOOD, IMMUNOCHEMICAL: Fecal Occult Bld: NEGATIVE

## 2020-10-27 ENCOUNTER — Ambulatory Visit (INDEPENDENT_AMBULATORY_CARE_PROVIDER_SITE_OTHER): Payer: PPO

## 2020-10-27 ENCOUNTER — Other Ambulatory Visit: Payer: Self-pay

## 2020-10-27 DIAGNOSIS — E538 Deficiency of other specified B group vitamins: Secondary | ICD-10-CM | POA: Diagnosis not present

## 2020-10-27 MED ORDER — CYANOCOBALAMIN 1000 MCG/ML IJ SOLN
1000.0000 ug | INTRAMUSCULAR | Status: AC
  Administered 2020-10-27 – 2023-05-16 (×4): 1000 ug via INTRAMUSCULAR

## 2020-10-27 NOTE — Progress Notes (Signed)
Pt here for monthly B12 injection per [facility]  B12 1064mg given right IM, and pt tolerated injection well.  Next B12 injection scheduled for monthly B12 shot

## 2020-10-28 ENCOUNTER — Other Ambulatory Visit: Payer: PPO

## 2020-10-29 ENCOUNTER — Other Ambulatory Visit: Payer: Self-pay | Admitting: Family Medicine

## 2020-10-29 DIAGNOSIS — E871 Hypo-osmolality and hyponatremia: Secondary | ICD-10-CM

## 2020-11-19 ENCOUNTER — Other Ambulatory Visit (INDEPENDENT_AMBULATORY_CARE_PROVIDER_SITE_OTHER): Payer: PPO

## 2020-11-19 ENCOUNTER — Other Ambulatory Visit: Payer: Self-pay

## 2020-11-19 DIAGNOSIS — E871 Hypo-osmolality and hyponatremia: Secondary | ICD-10-CM

## 2020-11-19 DIAGNOSIS — E538 Deficiency of other specified B group vitamins: Secondary | ICD-10-CM

## 2020-11-19 DIAGNOSIS — D649 Anemia, unspecified: Secondary | ICD-10-CM | POA: Diagnosis not present

## 2020-11-19 DIAGNOSIS — G4733 Obstructive sleep apnea (adult) (pediatric): Secondary | ICD-10-CM | POA: Diagnosis not present

## 2020-11-19 DIAGNOSIS — D509 Iron deficiency anemia, unspecified: Secondary | ICD-10-CM | POA: Diagnosis not present

## 2020-11-19 LAB — COMPREHENSIVE METABOLIC PANEL
ALT: 12 U/L (ref 0–35)
AST: 15 U/L (ref 0–37)
Albumin: 4.1 g/dL (ref 3.5–5.2)
Alkaline Phosphatase: 85 U/L (ref 39–117)
BUN: 12 mg/dL (ref 6–23)
CO2: 28 mEq/L (ref 19–32)
Calcium: 9.6 mg/dL (ref 8.4–10.5)
Chloride: 104 mEq/L (ref 96–112)
Creatinine, Ser: 1.15 mg/dL (ref 0.40–1.20)
GFR: 46.82 mL/min — ABNORMAL LOW (ref >60.00)
Glucose, Bld: 86 mg/dL (ref 70–99)
Potassium: 4.4 mEq/L (ref 3.5–5.1)
Sodium: 141 mEq/L (ref 135–145)
Total Bilirubin: 0.6 mg/dL (ref 0.2–1.2)
Total Protein: 7.2 g/dL (ref 6.0–8.3)

## 2020-11-19 LAB — CBC WITH DIFFERENTIAL/PLATELET
Basophils Absolute: 0.1 10*3/uL (ref 0.0–0.1)
Basophils Relative: 2.6 % (ref 0.0–3.0)
Eosinophils Absolute: 0.2 10*3/uL (ref 0.0–0.7)
Eosinophils Relative: 5.2 % — ABNORMAL HIGH (ref 0.0–5.0)
HCT: 35.9 % — ABNORMAL LOW (ref 36.0–46.0)
Hemoglobin: 11.3 g/dL — ABNORMAL LOW (ref 12.0–15.0)
Lymphocytes Relative: 33.4 % (ref 12.0–46.0)
Lymphs Abs: 1.5 10*3/uL (ref 0.7–4.0)
MCHC: 31.6 g/dL (ref 30.0–36.0)
MCV: 77.8 fl — ABNORMAL LOW (ref 78.0–100.0)
Monocytes Absolute: 0.5 10*3/uL (ref 0.1–1.0)
Monocytes Relative: 12 % (ref 3.0–12.0)
Neutro Abs: 2.1 10*3/uL (ref 1.4–7.7)
Neutrophils Relative %: 46.8 % (ref 43.0–77.0)
Platelets: 314 10*3/uL (ref 150.0–400.0)
RBC: 4.62 Mil/uL (ref 3.87–5.11)
RDW: 18.9 % — ABNORMAL HIGH (ref 11.5–15.5)
WBC: 4.5 10*3/uL (ref 4.0–10.5)

## 2020-11-22 ENCOUNTER — Other Ambulatory Visit: Payer: Self-pay | Admitting: Family Medicine

## 2020-11-22 DIAGNOSIS — D509 Iron deficiency anemia, unspecified: Secondary | ICD-10-CM

## 2020-11-22 LAB — INTRINSIC FACTOR ANTIBODIES: Intrinsic Factor: NEGATIVE

## 2020-11-22 LAB — IRON,TIBC AND FERRITIN PANEL
%SAT: 6 % (calc) — ABNORMAL LOW (ref 16–45)
Ferritin: 5 ng/mL — ABNORMAL LOW (ref 16–288)
Iron: 27 ug/dL — ABNORMAL LOW (ref 45–160)
TIBC: 438 mcg/dL (calc) (ref 250–450)

## 2020-11-23 ENCOUNTER — Telehealth: Payer: Self-pay | Admitting: *Deleted

## 2020-11-23 NOTE — Telephone Encounter (Signed)
Per referral Dr. Charlett Blake - called and gave upcoming appointments - confirmed - mailed welcome packet with calendar

## 2020-11-29 ENCOUNTER — Other Ambulatory Visit: Payer: Self-pay | Admitting: Family Medicine

## 2020-11-30 ENCOUNTER — Other Ambulatory Visit: Payer: Self-pay

## 2020-11-30 ENCOUNTER — Ambulatory Visit (INDEPENDENT_AMBULATORY_CARE_PROVIDER_SITE_OTHER): Payer: PPO

## 2020-11-30 DIAGNOSIS — E538 Deficiency of other specified B group vitamins: Secondary | ICD-10-CM | POA: Diagnosis not present

## 2020-11-30 MED ORDER — CYANOCOBALAMIN 1000 MCG/ML IJ SOLN
1000.0000 ug | Freq: Once | INTRAMUSCULAR | Status: AC
  Administered 2020-11-30: 1000 ug via INTRAMUSCULAR

## 2020-11-30 NOTE — Progress Notes (Signed)
Pt here today for monthly B12 injection.   Cyanocobalamin 1074mcg 63mL injected IM into R deltoid. Pt tolerated injection well.    Next in 1 month. Nurse visit scheduled 12/28/20 at 11:15am.

## 2020-12-07 ENCOUNTER — Other Ambulatory Visit: Payer: PPO

## 2020-12-07 ENCOUNTER — Ambulatory Visit: Payer: PPO | Admitting: Family

## 2020-12-09 ENCOUNTER — Telehealth: Payer: Self-pay | Admitting: Family Medicine

## 2020-12-09 NOTE — Telephone Encounter (Signed)
Patient would like to know if she is still supposed to take her trospium (SANCTURA) 20 MG tablet medication. She tried to refill it and the pharmacy denied it. Please advice.

## 2020-12-10 ENCOUNTER — Other Ambulatory Visit: Payer: Self-pay | Admitting: Family Medicine

## 2020-12-10 MED ORDER — TROSPIUM CHLORIDE 20 MG PO TABS: 20.0000 mg | ORAL_TABLET | Freq: Every day | ORAL | 1 refills | Status: DC

## 2020-12-10 NOTE — Telephone Encounter (Signed)
Did not see where you ever wrote this and also do not see where we denied.  Are you ok with refill?

## 2020-12-13 NOTE — Telephone Encounter (Signed)
done

## 2020-12-27 ENCOUNTER — Other Ambulatory Visit: Payer: Self-pay | Admitting: Family

## 2020-12-27 DIAGNOSIS — D649 Anemia, unspecified: Secondary | ICD-10-CM

## 2020-12-28 ENCOUNTER — Other Ambulatory Visit: Payer: Self-pay

## 2020-12-28 ENCOUNTER — Encounter: Payer: Self-pay | Admitting: Family

## 2020-12-28 ENCOUNTER — Ambulatory Visit (INDEPENDENT_AMBULATORY_CARE_PROVIDER_SITE_OTHER): Payer: PPO

## 2020-12-28 ENCOUNTER — Inpatient Hospital Stay: Payer: PPO | Admitting: Family

## 2020-12-28 ENCOUNTER — Inpatient Hospital Stay: Payer: PPO | Attending: Hematology & Oncology

## 2020-12-28 ENCOUNTER — Other Ambulatory Visit: Payer: Self-pay | Admitting: Family

## 2020-12-28 VITALS — BP 150/75 | HR 56 | Temp 98.5°F | Resp 17 | Ht 66.0 in | Wt 167.8 lb

## 2020-12-28 DIAGNOSIS — Z79899 Other long term (current) drug therapy: Secondary | ICD-10-CM | POA: Insufficient documentation

## 2020-12-28 DIAGNOSIS — D509 Iron deficiency anemia, unspecified: Secondary | ICD-10-CM

## 2020-12-28 DIAGNOSIS — K30 Functional dyspepsia: Secondary | ICD-10-CM | POA: Diagnosis not present

## 2020-12-28 DIAGNOSIS — E538 Deficiency of other specified B group vitamins: Secondary | ICD-10-CM

## 2020-12-28 DIAGNOSIS — D649 Anemia, unspecified: Secondary | ICD-10-CM

## 2020-12-28 DIAGNOSIS — M8580 Other specified disorders of bone density and structure, unspecified site: Secondary | ICD-10-CM | POA: Insufficient documentation

## 2020-12-28 DIAGNOSIS — Z7982 Long term (current) use of aspirin: Secondary | ICD-10-CM

## 2020-12-28 DIAGNOSIS — E7211 Homocystinuria: Secondary | ICD-10-CM

## 2020-12-28 DIAGNOSIS — K589 Irritable bowel syndrome without diarrhea: Secondary | ICD-10-CM | POA: Insufficient documentation

## 2020-12-28 DIAGNOSIS — D529 Folate deficiency anemia, unspecified: Secondary | ICD-10-CM

## 2020-12-28 HISTORY — DX: Iron deficiency anemia, unspecified: D50.9

## 2020-12-28 LAB — CMP (CANCER CENTER ONLY)
ALT: 11 U/L (ref 0–44)
AST: 15 U/L (ref 15–41)
Albumin: 4.1 g/dL (ref 3.5–5.0)
Alkaline Phosphatase: 77 U/L (ref 38–126)
Anion gap: 9 (ref 5–15)
BUN: 13 mg/dL (ref 8–23)
CO2: 27 mmol/L (ref 22–32)
Calcium: 9.5 mg/dL (ref 8.9–10.3)
Chloride: 105 mmol/L (ref 98–111)
Creatinine: 1.19 mg/dL — ABNORMAL HIGH (ref 0.44–1.00)
GFR, Estimated: 48 mL/min — ABNORMAL LOW (ref >60)
Glucose, Bld: 88 mg/dL (ref 70–99)
Potassium: 4 mmol/L (ref 3.5–5.1)
Sodium: 141 mmol/L (ref 135–145)
Total Bilirubin: 0.7 mg/dL (ref 0.3–1.2)
Total Protein: 7.2 g/dL (ref 6.5–8.1)

## 2020-12-28 LAB — CBC WITH DIFFERENTIAL (CANCER CENTER ONLY)
Abs Immature Granulocytes: 0.01 10*3/uL (ref 0.00–0.07)
Basophils Absolute: 0.1 10*3/uL (ref 0.0–0.1)
Basophils Relative: 2 %
Eosinophils Absolute: 0.3 10*3/uL (ref 0.0–0.5)
Eosinophils Relative: 5 %
HCT: 34.6 % — ABNORMAL LOW (ref 36.0–46.0)
Hemoglobin: 10.9 g/dL — ABNORMAL LOW (ref 12.0–15.0)
Immature Granulocytes: 0 %
Lymphocytes Relative: 36 %
Lymphs Abs: 1.8 10*3/uL (ref 0.7–4.0)
MCH: 26 pg (ref 26.0–34.0)
MCHC: 31.5 g/dL (ref 30.0–36.0)
MCV: 82.4 fL (ref 80.0–100.0)
Monocytes Absolute: 0.7 10*3/uL (ref 0.1–1.0)
Monocytes Relative: 13 %
Neutro Abs: 2.2 10*3/uL (ref 1.7–7.7)
Neutrophils Relative %: 44 %
Platelet Count: 291 10*3/uL (ref 150–400)
RBC: 4.2 MIL/uL (ref 3.87–5.11)
RDW: 16.5 % — ABNORMAL HIGH (ref 11.5–15.5)
WBC Count: 5 10*3/uL (ref 4.0–10.5)
nRBC: 0 % (ref 0.0–0.2)

## 2020-12-28 LAB — RETICULOCYTES
Immature Retic Fract: 19.7 % — ABNORMAL HIGH (ref 2.3–15.9)
RBC.: 4.16 MIL/uL (ref 3.87–5.11)
Retic Count, Absolute: 46.6 10*3/uL (ref 19.0–186.0)
Retic Ct Pct: 1.1 % (ref 0.4–3.1)

## 2020-12-28 LAB — IRON AND TIBC
Iron: 45 ug/dL (ref 41–142)
Saturation Ratios: 10 % — ABNORMAL LOW (ref 21–57)
TIBC: 429 ug/dL (ref 236–444)
UIBC: 384 ug/dL (ref 120–384)

## 2020-12-28 LAB — LACTATE DEHYDROGENASE: LDH: 195 U/L — ABNORMAL HIGH (ref 98–192)

## 2020-12-28 LAB — SAMPLE TO BLOOD BANK

## 2020-12-28 LAB — SAVE SMEAR(SSMR), FOR PROVIDER SLIDE REVIEW

## 2020-12-28 LAB — FERRITIN: Ferritin: 8 ng/mL — ABNORMAL LOW (ref 11–307)

## 2020-12-28 MED ORDER — FOLIC ACID 1 MG PO TABS: 1.0000 mg | ORAL_TABLET | Freq: Every day | ORAL | 6 refills | Status: DC

## 2020-12-28 MED ORDER — CYANOCOBALAMIN 1000 MCG/ML IJ SOLN
1000.0000 ug | Freq: Once | INTRAMUSCULAR | Status: AC
  Administered 2020-12-28: 1000 ug via INTRAMUSCULAR

## 2020-12-28 NOTE — Progress Notes (Signed)
Hematology/Oncology Consultation   Name: Erika [name]      MRN: [id number]    Location: Room/bed info not found  Date: 12/28/2020 Time:9:17 AM   REFERRING PHYSICIAN:  Stacey A. 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 Erika [name] is a very pleasant 75 yo caucasian female with history of multifactorial anemia including iron deficiency and B 12 deficiency (managed by PCP).  Iron saturation in September was 6% and ferritin was 5. She has been taking an oral supplement since then but this has caused GI upset and constipation.  No blood loss noted. No bruising or petechiae.  She had hemoccult testing with PCP and result was negative.  She takes Pepcid as needed for GERD.  She has started monthly B 12 injections with her PCP.  She was also noted to have an elevated homocystine level at 14.7. We will get her onto folic acid PO daily.  She has had a lot of issues with memory/forgetfulness and recall first noted in June 2022. 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 She has a follow-up appointment in November as well as a 4 hour "memory" test scheduled for in December.  She has occasional episodes of dizziness and notes fatigue.  No history of stroke or thrombotic event.  Her mother had history of stroke in her 35's.  She had her colonoscopy in June 2021 which showed hemorrhoids and had a total of 6 hyperplastic polyps removed from the descending, transverse, ascending colon and rectum.  She had her mammogram in July and result was negative.  She has 2 biological children, 2 adopted and no history of miscarriage.  No history of diabetes or thyroid disease.  No fever, chills, n/v, cough, rash, SOB, chest pain, palpitations, abdominal pain or changes in bowel or bladder habits.  No swelling, tenderness,  numbness or tingling in her extremities at this time.  She has occasional puffiness in her feet/ankles that comes and goes.  No falls or syncope to report.  No smoking, ETOH or recreational drug use.  She has a good appetite and is staying well hydrated throughout the day.  Her weight is stable at 167 lbs.   ROS: All other 10 point review of systems is negative.   PAST MEDICAL HISTORY:   Past Medical History:  Diagnosis Date   Allergic state 06/24/2012   Anterior cervical lymphadenopathy 02/08/2013   Arthritis 04/02/2011   Bronchitis 04/02/2011   Chicken pox as a child   Diverticulosis of colon    colonoscopy 04/14/1999   GERD (gastroesophageal reflux disease)    History of fibrocystic disease of breast    Hyperlipidemia, mixed 02/20/2007   Followed as Primary Care Patient/ [facility]/ [facility]  Goal LDL < 130 HBP/Pos fm hx       Hypertension    on 4 medications for HTN   Hypokalemia 06/24/2012   Hyponatremia 08/02/2014   IBS (irritable bowel syndrome) 04/02/2011   Measles as a child   Mumps as a child   Obesity    Osteopenia    Osteopenia after menopause    Overactive bladder 04/02/2011   Overweight    Preventative health care 03/13/2012   Shoulder pain, bilateral 06/14/2011   Sleep apnea    uses cpap    ALLERGIES: No Known Allergies    MEDICATIONS:  Current Outpatient Medications on File Prior to Visit  Medication Sig Dispense Refill   amLODipine (NORVASC) 10 MG tablet TAKE 0.5 TABLETS BY MOUTH 2 TIMES DAILY. 90 tablet 1   aspirin EC 81 MG tablet Take 1 tablet (81 mg total) by mouth daily.     calcium carbonate (TUMS - DOSED IN MG ELEMENTAL CALCIUM) 500 MG chewable tablet Chew 1 tablet by mouth daily.     famotidine (PEPCID) 40 MG tablet Take 1 tablet (40 mg total) by mouth at bedtime as needed for heartburn or indigestion. 90 tablet 3   ferrous fumarate (HEMOCYTE - 106 MG FE) 325 (106 Fe) MG TABS tablet Take 1 tablet (106 mg of iron total) by mouth daily. 30 tablet 3    furosemide (LASIX) 20 MG tablet TAKE 0.5 TABLETS (10 MG TOTAL) BY MOUTH DAILY AS NEEDED FOR EDEMA. 45 tablet 1   metoprolol succinate (TOPROL-XL) 50 MG 24 hr tablet TAKE 1 TABLET BY MOUTH TWICE A DAY WITH OR IMMEDIATELY FOLLOWING A MEAL 180 tablet 2   Multiple Minerals-Vitamins (CITRACAL PLUS PO) Take 1 tablet by mouth daily.     rosuvastatin (CRESTOR) 20 MG tablet Take 1 tablet (20 mg total) by mouth daily. 90 tablet 3   trospium (SANCTURA) 20 MG tablet Take 1 tablet (20 mg total) by mouth daily. 90 tablet 1   UNABLE TO FIND COGNITIVE TABLET DAILY     valsartan (DIOVAN) 320 MG tablet TAKE 1 TABLET BY MOUTH EVERY DAY 90 tablet 1   Current Facility-Administered Medications on File Prior to Visit  Medication Dose Route Frequency Provider Last Rate Last Admin   cyanocobalamin ((VITAMIN B-12)) injection 1,000 mcg  1,000 mcg Intramuscular Q30 days Carollee Herter, Kendrick Fries R, DO   1,000 mcg at 10/27/20 1052     PAST SURGICAL HISTORY Past Surgical History:  Procedure Laterality Date   BREAST REDUCTION SURGERY Bilateral 1986   BUNIONECTOMY Bilateral 1980   CATARACT EXTRACTION, BILATERAL     COLONOSCOPY  2011   HAMMER TOE SURGERY Right 12/03/2013   foot   MOUTH SURGERY  01/10/2013   negative   TONSILLECTOMY     UPPER GASTROINTESTINAL ENDOSCOPY  2016   [facility]    FAMILY HISTORY: Family History  Problem Relation Age of Onset   Stroke Mother 20   Hyperlipidemia Mother    Hypertension Mother    Heart attack Father 33   Hypertension Father    Heart disease Father    Hypertension Brother    Aneurysm Brother        aortic   Hyperlipidemia Brother    Heart disease Brother        aortic aneurysm rupture   Multiple sclerosis Daughter    Alcohol abuse Son    Schizophrenia Son    Bipolar disorder Son    Mental illness Son        schizophrenic, bipolar   Colon cancer Neg Hx    Colon polyps Neg Hx    Esophageal cancer Neg Hx    Rectal cancer Neg Hx    Stomach cancer Neg Hx    Alzheimer's  disease Neg Hx     SOCIAL HISTORY:  reports that she has never smoked. She has never used smokeless tobacco. She reports that she does not drink alcohol and does not use drugs.  PERFORMANCE STATUS: The patient's performance status is 1 - Symptomatic but completely ambulatory  PHYSICAL EXAM: Most Recent Vital Signs: Blood pressure (!) 150/75, pulse (!) 56, temperature 98.5 F (  36.9 C), temperature source Oral, resp. rate 17, height 5\' 6"  (1.676 m), weight 167 lb 12.8 oz (76.1 kg), SpO2 99 %. BP (!) 150/75 (BP Location: Left Arm, Patient Position: Sitting)   Pulse (!) 56   Temp 98.5 F (36.9 C) (Oral)   Resp 17   Ht 5\' 6"  (1.676 m)   Wt 167 lb 12.8 oz (76.1 kg)   SpO2 99%   BMI 27.08 kg/m   General Appearance:    Alert, cooperative, no distress, appears stated age  Head:    Normocephalic, without obvious abnormality, atraumatic  Eyes:    PERRL, conjunctiva/corneas clear, EOM's intact, fundi    benign, both eyes        Throat:   Lips, mucosa, and tongue normal; teeth and gums normal  Neck:   Supple, symmetrical, trachea midline, no adenopathy;    thyroid:  no enlargement/tenderness/nodules; no carotid   bruit or JVD  Back:     Symmetric, no curvature, ROM normal, no CVA tenderness  Lungs:     Clear to auscultation bilaterally, respirations unlabored  Chest Wall:    No tenderness or deformity   Heart:    Regular rate and rhythm, S1 and S2 normal, no murmur, rub   or gallop     Abdomen:     Soft, non-tender, bowel sounds active all four quadrants,    no masses, no organomegaly        Extremities:   Extremities normal, atraumatic, no cyanosis or edema  Pulses:   2+ and symmetric all extremities  Skin:   Skin color, texture, turgor normal, no rashes or lesions  Lymph nodes:   Cervical, supraclavicular, and axillary nodes normal  Neurologic:   CNII-XII intact, normal strength, sensation and reflexes    throughout    LABORATORY DATA:  Results for orders placed or performed  in visit on 12/28/20 (from the past 48 hour(s))  CBC with Differential ([facility] Only)     Status: Abnormal   Collection Time: 12/28/20  8:46 AM  Result Value Ref Range   WBC Count 5.0 4.0 - 10.5 K/uL   RBC 4.20 3.87 - 5.11 MIL/uL   Hemoglobin 10.9 (L) 12.0 - 15.0 g/dL   HCT 34.6 (L) 36.0 - 46.0 %   MCV 82.4 80.0 - 100.0 fL   MCH 26.0 26.0 - 34.0 pg   MCHC 31.5 30.0 - 36.0 g/dL   RDW 16.5 (H) 11.5 - 15.5 %   Platelet Count 291 150 - 400 K/uL   nRBC 0.0 0.0 - 0.2 %   Neutrophils Relative % 44 %   Neutro Abs 2.2 1.7 - 7.7 K/uL   Lymphocytes Relative 36 %   Lymphs Abs 1.8 0.7 - 4.0 K/uL   Monocytes Relative 13 %   Monocytes Absolute 0.7 0.1 - 1.0 K/uL   Eosinophils Relative 5 %   Eosinophils Absolute 0.3 0.0 - 0.5 K/uL   Basophils Relative 2 %   Basophils Absolute 0.1 0.0 - 0.1 K/uL   Immature Granulocytes 0 %   Abs Immature Granulocytes 0.01 0.00 - 0.07 K/uL    Comment: Performed at [facility] at [facility], [address], [place], [place] [address]  CMP ([facility] only)     Status: Abnormal   Collection Time: 12/28/20  8:46 AM  Result Value Ref Range   Sodium 141 135 - 145 mmol/L   Potassium 4.0 3.5 - 5.1 mmol/L   Chloride 105 98 - 111  mmol/L   CO2 27 22 - 32 mmol/L   Glucose, Bld 88 70 - 99 mg/dL    Comment: Glucose reference range applies only to samples taken after fasting for at least 8 hours.   BUN 13 8 - 23 mg/dL   Creatinine 1.19 (H) 0.44 - 1.00 mg/dL   Calcium 9.5 8.9 - 10.3 mg/dL   Total Protein 7.2 6.5 - 8.1 g/dL   Albumin 4.1 3.5 - 5.0 g/dL   AST 15 15 - 41 U/L   ALT 11 0 - 44 U/L   Alkaline Phosphatase 77 38 - 126 U/L   Total Bilirubin 0.7 0.3 - 1.2 mg/dL   GFR, Estimated 48 (L) >60 mL/min    Comment: (NOTE) Calculated using the CKD-EPI Creatinine Equation (2021)    Anion gap 9 5 - 15    Comment: Performed at [facility] at [facility], [address] [place], [place] [address]   Reticulocytes     Status: Abnormal   Collection Time: 12/28/20  8:46 AM  Result Value Ref Range   Retic Ct Pct 1.1 0.4 - 3.1 %   RBC. 4.16 3.87 - 5.11 MIL/uL   Retic Count, Absolute 46.6 19.0 - 186.0 K/uL   Immature Retic Fract 19.7 (H) 2.3 - 15.9 %    Comment: Performed at [facility] at [facility], [address], [place], [place] [address]  Save Smear [facility])     Status: None   Collection Time: 12/28/20  8:46 AM  Result Value Ref Range   Smear Review SMEAR STAINED AND AVAILABLE FOR REVIEW     Comment: Performed at [facility] at [facility], [address] [place], [place] [address]      RADIOGRAPHY: No results found.     PATHOLOGY:    ASSESSMENT/PLAN: Ms. Weld is a very pleasant 75 yo caucasian female with history of multifactorial anemia including iron deficiency and B 12 deficiency (managed by PCP). She also had a slightly elevated homocystine level. Iron studies are pending. We will replace if needed. She can stop her oral iron supplement due to constipation and GI upset.  Folic acid 1 mg PO daily ordered.  Follow-up in 8 weeks to reassess.   All questions were answered. The patient knows to call the clinic with any problems, questions or concerns. We can certainly see the patient much sooner if necessary.   [name], NP

## 2020-12-28 NOTE — Progress Notes (Signed)
Erika [name] is a 75 y.o. female presents to the office today for b12 injections, per physician's orders. Original order: Per Dr. Larose Kells 09-17-20 on lab results. Cyanocobalamoin  (med), 1000 mg/ml (dose),  IM (route) was administered Left deltoid (location) today. Patient tolerated injection.  Patient next injection due: in one month, appt made   Jiles Prows

## 2020-12-29 LAB — ERYTHROPOIETIN: Erythropoietin: 60 m[IU]/mL — ABNORMAL HIGH (ref 2.6–18.5)

## 2021-01-03 ENCOUNTER — Other Ambulatory Visit: Payer: Self-pay

## 2021-01-03 ENCOUNTER — Inpatient Hospital Stay: Payer: PPO

## 2021-01-03 ENCOUNTER — Telehealth: Payer: Self-pay | Admitting: *Deleted

## 2021-01-03 VITALS — BP 180/53 | HR 58 | Temp 98.9°F | Resp 20

## 2021-01-03 DIAGNOSIS — D509 Iron deficiency anemia, unspecified: Secondary | ICD-10-CM

## 2021-01-03 MED ORDER — SODIUM CHLORIDE 0.9 % IV SOLN
1000.0000 mg | Freq: Once | INTRAVENOUS | Status: AC
  Administered 2021-01-03: 1000 mg via INTRAVENOUS
  Filled 2021-01-03: qty 10

## 2021-01-03 MED ORDER — SODIUM CHLORIDE 0.9 % IV SOLN
Freq: Once | INTRAVENOUS | Status: AC
Start: 2021-01-03 — End: 2021-01-03

## 2021-01-03 NOTE — Patient Instructions (Signed)

## 2021-01-03 NOTE — Telephone Encounter (Signed)
Per Dr.Athar wanted reschedule patients appointment on [facility] 01/04/2021 to Jan 23,2023 . Dr. Rexene Alberts wanted to wait until after results from Neuro psych . Both appointments for [facility] psych are in Dec. Spoke to husband Orpah Greek) husband understood and thanked me for calling

## 2021-01-04 ENCOUNTER — Ambulatory Visit: Payer: PPO | Admitting: Neurology

## 2021-01-10 ENCOUNTER — Inpatient Hospital Stay: Payer: PPO | Attending: Hematology & Oncology

## 2021-01-10 ENCOUNTER — Other Ambulatory Visit: Payer: Self-pay

## 2021-01-10 VITALS — BP 178/66 | HR 58 | Temp 98.7°F | Resp 18

## 2021-01-10 DIAGNOSIS — D509 Iron deficiency anemia, unspecified: Secondary | ICD-10-CM

## 2021-01-10 MED ORDER — SODIUM CHLORIDE 0.9 % IV SOLN
1000.0000 mg | Freq: Once | INTRAVENOUS | Status: DC
  Filled 2021-01-10: qty 10

## 2021-01-10 MED ORDER — SODIUM CHLORIDE 0.9 % IV SOLN: Freq: Once | INTRAVENOUS | Status: AC

## 2021-01-10 NOTE — Patient Instructions (Signed)
Iron Deficiency Anemia, Adult Iron deficiency anemia is when you do not have enough red blood cells or hemoglobin in your blood. This happens because you have too little iron in your body. Hemoglobin carries oxygen to parts of the body. Anemia can cause yourbody to not get enough oxygen. What are the causes? Not eating enough foods that have iron in them. The body not being able to take in iron well. Needing more iron due to pregnancy or heavy menstrual periods, for females. Cancer. Bleeding in the bowels. Many blood draws. What increases the risk? Being pregnant. Being a teenage girl going through a growth spurt. What are the signs or symptoms? Pale skin, lips, and nails. Weakness, dizziness, and getting tired easily. Headache. Feeling like you cannot breathe well when moving (shortness of breath). Cold hands and feet. Fast heartbeat or a heartbeat that is not regular. Feeling grouchy (irritable) or breathing fast. These are more common in very bad anemia. Mild anemia may not cause any symptoms. How is this treated? This condition is treated by finding out why you do not have enough iron and then getting more iron. It may include: Adding foods to your diet that have a lot of iron. Taking iron pills (supplements). If you are pregnant or breastfeeding, you may need to take extra iron. Your diet often does not provide the amount of iron that you need. Getting more vitamin C in your diet. Vitamin C helps your body take in iron. You may need to take iron pills with a glass of orange juice or vitamin C pills. Medicines to make heavy menstrual periods lighter. Surgery. You may need blood tests to see if treatment is working. If the treatment doesnot seem to be working, you may need more tests. Follow these instructions at home: Medicines Take over-the-counter and prescription medicines only as told by your doctor. This includes iron pills and vitamins. Take iron pills when your stomach is  empty. If you cannot handle this, take them with food. Do not drink milk or take antacids at the same time as your iron pills. Iron pills may turn your poop (stool)black. If you cannot handle taking iron pills by mouth, ask your doctor about getting iron through: An IV tube. A shot (injection) into a muscle. Eating and drinking  Talk with your doctor before changing the foods you eat. He or she may tell you to eat foods that have a lot of iron, such as: Liver. Low-fat (lean) beef. Breads and cereals that have iron added to them. Eggs. Dried fruit. Dark green, leafy vegetables. Eat fresh fruits and vegetables that are high in vitamin C. They help your body use iron. Foods with a lot of vitamin C include: Oranges. Peppers. Tomatoes. Mangoes. Drink enough fluid to keep your pee (urine) pale yellow.  Managing constipation If you are taking iron pills, they may cause trouble pooping (constipation). To prevent or treat trouble pooping, you may need to: Take over-the-counter or prescription medicines. Eat foods that are high in fiber. These include beans, whole grains, and fresh fruits and vegetables. Limit foods that are high in fat and sugar. These include fried or sweet foods. General instructions Return to your normal activities as told by your doctor. Ask your doctor what activities are safe for you. Keep yourself clean, and keep things clean around you. Keep all follow-up visits as told by your doctor. This is important. Contact a doctor if: You feel like you may vomit (nauseous), or you vomit. You feel   weak. You are sweating for no reason. You have trouble pooping, such as: Pooping less than 3 times a week. Straining to poop. Having poop that is hard, dry, or larger than normal. Feeling full or bloated. Pain in the lower belly. Not feeling better after pooping. Get help right away if: You pass out (faint). You have chest pain. You have trouble breathing that: Is very  bad. Gets worse with physical activity. You have a fast heartbeat, or a heartbeat that does not feel regular. You get light-headed when getting up from sitting or lying down. These symptoms may be an emergency. Do not wait to see if the symptoms will go away. Get medical help right away. Call your local emergency services (911 in the [place] Do not drive yourself to the hospital. Summary Iron deficiency anemia is when you have too little iron in your body. This condition is treated by finding out why you do not have enough iron in your body and then getting more iron. Take over-the-counter and prescription medicines only as told by your doctor. Eat fresh fruits and vegetables that are high in vitamin C. Get help right away if you cannot breathe well. This information is not intended to replace advice given to you by your health care provider. Make sure you discuss any questions you have with your healthcare provider. Document Revised: 10/29/2018 Document Reviewed: 10/29/2018 [employer] Patient Education  2022 [employer]  

## 2021-01-10 NOTE — Progress Notes (Signed)
After IV was started while assessing patient it was determined that patient received a dose of Monoferric last week on 10/31. Confirmed with pharmacy that this is a one time dose & that today's appt must've been made in error. Explained and apologized to pt who is gracious in understanding.

## 2021-02-03 ENCOUNTER — Ambulatory Visit: Payer: PPO | Admitting: Family Medicine

## 2021-02-21 ENCOUNTER — Other Ambulatory Visit: Payer: Self-pay

## 2021-02-21 ENCOUNTER — Encounter: Payer: Self-pay | Admitting: Psychology

## 2021-02-21 ENCOUNTER — Ambulatory Visit: Payer: PPO | Admitting: Psychology

## 2021-02-21 ENCOUNTER — Ambulatory Visit (INDEPENDENT_AMBULATORY_CARE_PROVIDER_SITE_OTHER): Payer: PPO | Admitting: Psychology

## 2021-02-21 DIAGNOSIS — F02A0 Dementia in other diseases classified elsewhere, mild, without behavioral disturbance, psychotic disturbance, mood disturbance, and anxiety: Secondary | ICD-10-CM | POA: Diagnosis not present

## 2021-02-21 DIAGNOSIS — I679 Cerebrovascular disease, unspecified: Secondary | ICD-10-CM

## 2021-02-21 DIAGNOSIS — G3109 Other frontotemporal dementia: Secondary | ICD-10-CM

## 2021-02-21 DIAGNOSIS — R4189 Other symptoms and signs involving cognitive functions and awareness: Secondary | ICD-10-CM

## 2021-02-21 HISTORY — DX: Other frontotemporal neurocognitive disorder: G31.09

## 2021-02-21 NOTE — Progress Notes (Signed)
° °  Psychometrician Note   Cognitive testing was administered to Erika [name] by Milana Kidney, B.S. (psychometrist) under the supervision of Dr. Christia Reading, Ph.D., licensed psychologist on 02/21/21. [name] did not appear overtly distressed by the testing session per behavioral observation or responses across self-report questionnaires. Rest breaks were offered.    The battery of tests administered was selected by Dr. Christia Reading, Ph.D. with consideration to [name]'s current level of functioning, the nature of her symptoms, emotional and behavioral responses during interview, level of literacy, observed level of motivation/effort, and the nature of the referral question. This battery was communicated to the psychometrist. Communication between Dr. Christia Reading, Ph.D. and the psychometrist was ongoing throughout the evaluation and Dr. Christia Reading, Ph.D. was immediately accessible at all times. Dr. Christia Reading, Ph.D. provided supervision to the psychometrist on the date of this service to the extent necessary to assure the quality of all services provided.    Erika [name] will return within approximately 1-2 weeks for an interactive feedback session with Dr. Melvyn Novas at which time her test performances, clinical impressions, and treatment recommendations will be reviewed in detail. [name] understands she can contact our office should she require our assistance before this time.  A total of 130 minutes of billable time were spent face-to-face with [name] by the psychometrist. This includes both test administration and scoring time. Billing for these services is reflected in the clinical report generated by Dr. Christia Reading, Ph.D.  This note reflects time spent with the psychometrician and does not include test scores or any clinical interpretations made by Dr. Melvyn Novas. The full report will follow in a separate note.

## 2021-02-21 NOTE — Progress Notes (Addendum)
NEUROPSYCHOLOGICAL EVALUATION [place] Department of Neurology  Date of Evaluation: February 21, 2021  Reason for Referral:   Erika [name] is a 75 y.o. right-handed Caucasian female referred by  [name], M.D. , to characterize her current cognitive functioning and assist with diagnostic clarity and treatment planning in the context of subjective cognitive decline and prior medical records suggesting mild cognitive impairment.   Assessment and Plan:   Clinical Impression(s): Erika [name] pattern of performance is suggestive of severe impairment surrounding executive functioning (including safety and judgment), receptive and expressive language, and nearly all aspects of verbal learning and memory. Relative weaknesses were also exhibited across processing speed, complex attention (i.e., working memory), visuospatial abilities, and aspects of visual memory. Performance was appropriate across basic attention. Regarding ADLs, Erika [name]'s husband manages all medication, financial, and bill paying responsibilities. There were reports of Erika [name] overmedicating herself prior to him taking the former over. While she described herself as a good driver, her husband expressed concerns surrounding her getting lost. 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  The etiology for ongoing dysfunction is difficult to discern and likely multifactorial in nature. Based upon testing results, prominent and severe impairment surrounding executive functioning and receptive/expressive language certainly raises the concern for the presence of frontotemporal dementia. While she does not exhibit disinhibited features of the behavioral variant of this disease process, language dysfunction certainly raises concerns for a language variant  (i.e., primary progressive aphasia presentation). Of common language variant subtypes, Ms. Kantor does not demonstrate non-fluent, halting speech, nor does she exhibit strong evidence for diminished semantic knowledge, making non-fluent primary progressive aphasia and semantic dementia presentations unlikely. 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 While her age is somewhat advanced for onset of this condition, it is unclear how long these symptoms have been developing.   Given verbal memory impairment, there are additional concerns surrounding Alzheimer's disease. However, Erika [name] was able to recognize previously learned story and figure based information reasonably well after a delay. This does not suggest a consistent memory storage deficit, which would make this condition somewhat less likely. It is important to highlight that logopenic aphasia presentations often share the same neuropathology as Alzheimer's disease and that these two conditions can have notably overlapping symptoms as a result.  Outside of these causes, there is likely a vascular contribution to her overall presentation given recent neuroimaging suggesting stable moderate to severe small vessel ischemic disease. However, current deficits are far beyond what would be expected from these cerebrovascular changes alone. She does not exhibit behavioral features of Lewy body dementia or other more rare parkinsonian conditions at the present time. Continued medical monitoring will be important moving forward.   Recommendations: Erika [name] should discuss medications aimed to address memory loss and overall cognitive decline with Dr. Rexene Alberts. It is important to highlight that this medication has been shown to slow functional decline in some individuals. There is  no current treatment which can stop or reverse cognitive decline when caused by a neurodegenerative illness.   Additional neuroimaging in the form of a FDG-PET scan would likely be useful in providing further diagnostic clarity regarding concerns versus dementia due to Alzheimer's disease or frontotemporal dementia.   Admittedly, performance across neurocognitive  testing is not a strong predictor of an individual's safety operating a motor vehicle. However, given the extent of ongoing impairment, I would recommend that she abstain from driving pending a formal driving evaluation. Should her family wish to pursue a formalized driving evaluation, they could reach out to the following agencies: [employer] in [place]: [phone] Driver Rehabilitative Services: [facility]: [place]: [phone] or [phone]  Should there be further progression of current deficits over time, Erika [name] is unlikely to regain any independent living skills lost. She will likely benefit from the establishment and maintenance of a routine in order to maximize her functional abilities over time.  It will be important for her to have another person with her when in situations where she may need to process information, weigh the pros and cons of different options, and make decisions, in order to ensure that she fully understands and recalls all information to be considered.  If not already done, Erika [name] and her family may want to discuss her wishes regarding durable power of attorney and medical decision making, so that she can have input into these choices. Additionally, they may wish to discuss future plans for caretaking and seek out community options for in home/residential care should they become necessary.  Erika [name] is encouraged to attend to lifestyle factors for brain health (e.g., regular physical exercise, good nutrition habits, regular  participation in cognitively-stimulating activities, and general stress management techniques), which are likely to have benefits for both emotional adjustment and cognition. Optimal control of vascular risk factors (including safe cardiovascular exercise and adherence to dietary recommendations) is encouraged. Likewise, continued compliance with her CPAP machine will also be important. Continued participation in activities which provide mental stimulation and social interaction is also recommended.   Important information should be provided to Erika [name] in written format in all instances. This information should be placed in a highly frequented and easily visible location within her home to promote recall. External strategies such as written notes in a consistently used memory journal, visual and nonverbal auditory cues such as a calendar on the refrigerator or appointments with alarm, such as on a cell phone, can also help maximize recall.  Presenting information in short, concrete sentences will be helpful to ensure her comprehension. It will also be important to have her paraphrase back information rather than simply repeat to allow those working with her to ensure she understands what is being asked of her and/or told to her.  To address problems with processing speed, she may wish to consider:   -Ensuring that she is alerted when essential material or instructions are being presented   -Adjusting the speed at which new information is presented   -Allowing for more time in comprehending, processing, and responding in conversation  To address problems with fluctuating attention, she may wish to consider:   -Avoiding external distractions when needing to concentrate   -Limiting exposure to fast paced environments with multiple sensory demands   -Writing down complicated information and using checklists   -Attempting and completing one task at a time (i.e., no multi-tasking)   -Verbalizing aloud  each step of a task to maintain focus   -Reducing the amount of information considered at one time  Review of Records:   Erika [name] was seen by [facility] [name], M.D.) on 09/30/2020 for an evaluation of memory loss. At that time, difficulties were said to be present for approximately the past six months. Primary  difficulties surrounded word finding and losing her train of thought. She also described generalized forgetfulness. About six weeks ago, her daughter reported a sharper decline in memory, with increased instances of confusion and irritability. She has a vitamin B12 deficiency and recently started injections. No mood-related concerns were noted. She denied recent falls. There is a history of obstructive sleep apnea and she reported utilizing her CPAP machine frequently. Dr. Rexene Alberts did not provide any discussion surrounding potential ADL dysfunction. Records suggest that Erika [name]'s daughter recently administered her a brief cognitive screening instrument [facility]), which scored 16/30. Ultimately, Erika [name] was referred for a comprehensive neuropsychological evaluation to characterize her cognitive abilities and to assist with diagnostic clarity and treatment planning.   Brain MRI on 04/27/2018 revealed moderate to severe chronic small vessel ischemic changes. Head CT on 09/16/2020 was negative for any acute intracranial processes. Brain MRI on 10/04/2020 was stable relative to her 2020 scan.   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   Chest pain 01/19/2014   Chronic rhinitis 01/13/2009   Diverticulosis of colon    colonoscopy 04/14/1999   Dry mouth 03/13/2012   Fatigue 08/02/2014   GERD (gastroesophageal reflux disease)    Headache 02/11/2018   History of chicken pox    as a child   History of  fibrocystic disease of breast    History of measles    as a child   History of mumps    as a child   Hyperlipidemia, mixed 02/20/2007   Goal LDL < 130 HBP/Pos fm hx   Hypertension    Hypokalemia 06/24/2012   Hyponatremia 08/02/2014   IBS (irritable bowel syndrome) 04/02/2011   IDA (iron deficiency anemia) 12/28/2020   Low vitamin D level 01/09/2016   Mild cognitive impairment 12/24/2018   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   Obstructive sleep apnea 05/23/2017   uses CPAP nightly   Osteopenia after menopause    Overactive bladder 04/02/2011   Pedal edema 05/20/2020   Shoulder pain, bilateral 06/14/2011   Thoracic back pain 07/31/2017   Vitamin B 12 deficiency 10/16/2020    Past Surgical History:  Procedure Laterality Date   BREAST REDUCTION SURGERY Bilateral 1986   BUNIONECTOMY Bilateral 1980   CATARACT EXTRACTION, BILATERAL     COLONOSCOPY  2011   HAMMER TOE SURGERY Right 12/03/2013   foot   MOUTH SURGERY  01/10/2013   negative   TONSILLECTOMY     UPPER GASTROINTESTINAL ENDOSCOPY  2016   RK    Current Outpatient Medications:    amLODipine (NORVASC) 10 MG tablet, TAKE 0.5 TABLETS BY MOUTH 2 TIMES DAILY., Disp: 90 tablet, Rfl: 1   aspirin EC 81 MG tablet, Take 1 tablet (81 mg total) by mouth daily., Disp:  , Rfl:    calcium carbonate (TUMS - DOSED IN MG ELEMENTAL CALCIUM) 500 MG chewable tablet, Chew 1 tablet by mouth daily., Disp: , Rfl:    famotidine (PEPCID) 40 MG tablet, Take 1 tablet (40 mg total) by mouth at bedtime as needed for heartburn or indigestion., Disp: 90 tablet, Rfl: 3   ferrous fumarate (HEMOCYTE - 106 MG FE) 325 (106 Fe) MG TABS tablet, Take 1 tablet (106 mg of iron total) by mouth daily., Disp: 30 tablet, Rfl: 3   folic acid (FOLVITE) 1 MG tablet, Take 1 tablet (1 mg total) by mouth daily.,  Disp: 30 tablet, Rfl: 6   furosemide (LASIX) 20 MG tablet, TAKE 0.5 TABLETS (10 MG TOTAL) BY MOUTH DAILY AS NEEDED FOR EDEMA., Disp: 45 tablet, Rfl: 1   metoprolol  succinate (TOPROL-XL) 50 MG 24 hr tablet, TAKE 1 TABLET BY MOUTH TWICE A DAY WITH OR IMMEDIATELY FOLLOWING A MEAL, Disp: 180 tablet, Rfl: 2   Multiple Minerals-Vitamins (CITRACAL PLUS PO), Take 1 tablet by mouth daily., Disp: , Rfl:    rosuvastatin (CRESTOR) 20 MG tablet, Take 1 tablet (20 mg total) by mouth daily., Disp: 90 tablet, Rfl: 3   trospium (SANCTURA) 20 MG tablet, Take 1 tablet (20 mg total) by mouth daily., Disp: 90 tablet, Rfl: 1   UNABLE TO FIND, COGNITIVE TABLET DAILY, Disp: , Rfl:    valsartan (DIOVAN) 320 MG tablet, TAKE 1 TABLET BY MOUTH EVERY DAY, Disp: 90 tablet, Rfl: 1  Current Facility-Administered Medications:    cyanocobalamin ((VITAMIN B-12)) injection 1,000 mcg, 1,000 mcg, Intramuscular, Q30 days, Carollee Herter, [place] R, DO, 1,000 mcg at 10/27/20 1052  Clinical Interview:   The following information was obtained during a clinical interview with Erika [name] and her husband prior to cognitive testing.  Cognitive Symptoms: Decreased short-term memory: Endorsed. Ms. Capers described primary difficulties with word finding and losing her train of thought. After direct questioning, she reported some trouble recalling past conversations or names of individuals. Her husband described an instance this past June where she started stripping the sheets off the beds while [place] were staying at the beach on Thursday, despite them leaving on Saturday. She then attempted to do this again on Friday. He noted that this is what raised more significant concerns. However, memory and language dysfunction was present prior to this.  Decreased long-term memory: Denied. Decreased attention/concentration: Ms. Pletz was seemingly unable to comprehend this line of questioning. Across several attempts, she would respond with information unrelated to the questions being asked of her. Her husband noted mild trouble with distractibility.  Reduced processing speed: Unclear as there were difficulties  comprehending this line of questioning.  Difficulties with executive functions: Endorsed. There was some report of trouble with organization and decision making. No trouble with impulsivity was noted. They also denied any significant personality changes.  Difficulties with emotion regulation: Denied. Difficulties with receptive language: Denied. Difficulties with word finding: Endorsed. Decreased visuoperceptual ability: Denied.  Difficulties completing ADLs: Endorsed. When asked, Ms. Iannello would provide responses which were loosely related to what was asked but generally not informative. Her husband noted that he provides necessary assistance with medication management, noting that there were concerns that Ms. Laplante was overmedicating herself by double dosing in the past. He also provides assistance with financial management and bill paying. When asked about driving, Ms. Cuadras denied difficulties, emphasizing that she was a good driver. Her husband alluded to instances where she will briefly get lost before getting her bearings back. He also noted that she will not always accomplish the task she sets out to complete and may end up in different locations than originally intended.   Additional Medical History: History of traumatic brain injury/concussion: Denied. History of stroke: Denied. History of seizure activity: Denied. History of known exposure to toxins: Denied. Symptoms of chronic pain: Denied. Medical records suggest prior complaints of ongoing pain across several body systems/regions. However, per her report, she is not dealing with any pain at the present time.  Experience of frequent headaches/migraines: Endorsed. Headaches were said to occur a couple times per week but were not  debilitating in nature.  Frequent instances of dizziness/vertigo: Denied.  Sensory changes: She wears contact lenses with benefit. No other sensory changes/difficulties (e.g., hearing, taste, or smell)  were reported.  Balance/coordination difficulties: Denied. She denied any recent falls.  Other motor difficulties: Denied.  Sleep History: Estimated hours obtained each night: 6-8 hours.  Difficulties falling asleep: Denied. Difficulties staying asleep: Denied. Feels rested and refreshed upon awakening: Endorsed "for the most part."  History of snoring: Endorsed. History of waking up gasping for air: Endorsed. Witnessed breath cessation while asleep: Endorsed. She acknowledged a history of sleep apnea and reported using her CPAP machine nightly.   History of vivid dreaming: Denied. Excessive movement while asleep: Denied. Instances of acting out her dreams: Denied.  Psychiatric/Behavioral Health History: Depression: Denied. She described her current mood as "good" and denied to her knowledge any prior mental health concerns or diagnoses. Current or remote suicidal ideation, intent, or plan was denied.  Anxiety: Denied. Mania: Denied. Trauma History: Denied. Visual/auditory hallucinations: Denied. Delusional thoughts: Denied.  Tobacco: Denied. Alcohol: She denied current alcohol consumption as well as a history of problematic alcohol abuse or dependence.  Recreational drugs: Denied.  Family History: Problem Relation Age of Onset   Stroke Mother 25   Hyperlipidemia Mother    Hypertension Mother    Heart attack Father 63   Hypertension Father    Heart disease Father    Hypertension Brother    Aneurysm Brother        aortic   Hyperlipidemia Brother    Heart disease Brother        aortic aneurysm rupture   Multiple sclerosis Daughter    Alcohol abuse Son    Schizophrenia Son    Bipolar disorder Son    Mental illness Son        schizophrenic, bipolar   Colon cancer Neg Hx    Colon polyps Neg Hx    Esophageal cancer Neg Hx    Rectal cancer Neg Hx    Stomach cancer Neg Hx    Alzheimer's disease Neg Hx    This information was confirmed by Ms.  [name].  Academic/Vocational History: Highest level of educational attainment: 14 years. She graduated from high school and completed an Associate's degree. She described herself as a strong (mostly A) student in academic settings. No relative weaknesses were identified.  History of developmental delay: Denied. History of grade repetition: Denied. Enrollment in special education courses: Denied. History of LD/ADHD: Denied.  Employment: Retired. 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   Evaluation Results:   Behavioral Observations: Erika [name] was accompanied by her husband, arrived to her appointment on time, and was appropriately dressed and groomed. She appeared alert and oriented. Observed gait and station were within normal limits. Gross motor functioning appeared intact upon informal observation and no abnormal movements (e.g., tremors) were noted. Her affect was generally relaxed and positive. Spontaneous speech was fluent. However, there were a few isolated instances where she included a string of words in her statements that were unrelated to those that preceded and followed. She did not appear to notice this or otherwise kept the conversation going well. Some word finding difficulties were observed during interview. What was most apparent was trouble with receptive language. There were numerous instances where Ms. Colvin would provide an answer to an asked question that was unrelated to what was asked. When the question was repeated, she often continued to have difficulties, causing that line of questioning to be  abandoned. Outside of this, thought processes were coherent, organized, and normal in content. Insight into her cognitive difficulties appeared somewhat limited but likely adequate overall.   During testing, deficits with comprehension continued. There were numerous instances where Ms. Uhlig had trouble understanding task instructions. For example, on a list  learning memory task, immediately after being read task instructions, Ms. Chamblee began stating word associations with each word read aloud from the list, rather than silently studying and attempting to remember them. Instructions had to be repeated several times before she was able to complete the task. Additionally, during a block construction task, she repeatedly attempted to stack blocks even after instructions stating that this would not allow for correct responses were provided several times. When she was able to comprehend, task engagement was adequate and she persisted when challenged. Overall, Ms. Gurr was cooperative with the clinical interview and subsequent testing procedures.   Adequacy of Effort: The validity of neuropsychological testing is limited by the extent to which the individual being tested may be assumed to have exerted adequate effort during testing. Ms. Touchette expressed her intention to perform to the best of her abilities and exhibited adequate task engagement and persistence. Scores across stand-alone and embedded performance validity measures were variable but generally within expectation. As such, the results of the current evaluation are believed to be a valid representation of Ms. Pernell's current cognitive functioning.  Test Results: Ms. Crumpler was fully oriented at the time of the current evaluation.  Intellectual abilities based upon educational and vocational attainment were estimated to be in the average range. Premorbid abilities were estimated to be within the average range based upon a single-word reading test.   Processing speed was exceptionally low to below average. Basic attention was below average. More complex attention (e.g., working memory) was well below average to below average. Executive functioning was exceptionally low. Performance on a task assessing safety and judgment was well below average. Points were lost on this task due to a combination of  vague responses, as well as expressive language deficits. For example, when asked about the importance of replacing batteries in a smoke detector, she responded with "To keep...so that people can check your noise." When asked about the dangers of taking too much of a medication, she responded with "May fall asleep too much." Finally, when asked about what a 25% chance of a side effect meant, she responded with "25% is a pretty high range of keeping...people in a dirty bottle."    Assessed receptive language abilities were exceptionally low. She had trouble across all aspects of this task, including understanding conceptual information, sequencing items, understanding complex sentence structure, and following multi-step commands. This aligns with notable comprehension difficulties seen across both interview and testing. Reading comprehension was appropriate. Assessed expressive language was generally exceptionally low to well below average. Her writing samples were appropriate and she performed adequately while writing a check and paying a fictitious bill. However, verbal fluency, sentence repetition, and confrontation naming were certainly below expectation.     Assessed visuospatial/visuoconstructional abilities were variable, ranging from the exceptionally low to average normative ranges. Points were lost on her drawing of a clock due to mild spatial errors in numerical arrangement, as well as incorrect placement of the clock hands. Her copy of a complex figure was appropriate.    Learning (i.e., encoding) of novel verbal information was exceptionally low. Spontaneous delayed recall (i.e., retrieval) of previously learned information was below average across a figure drawing task but exceptionally  low across both verbal tasks. Retention rates were 25% (raw score of 1) across a story learning task, 0% across a list learning task, and 39% across a figure drawing task. Performance across recognition tasks was  variable, ranging from the exceptionally low to average normative ranges, suggesting some evidence for information consolidation.   Results of emotional screening instruments suggested that recent symptoms of generalized anxiety were in the minimal range, while symptoms of depression were within normal limits. A screening instrument assessing recent sleep quality suggested the presence of minimal sleep dysfunction.  Tables of Scores:   Note: This summary of test scores accompanies the interpretive report and should not be considered in isolation without reference to the appropriate sections in the text. Descriptors are based on appropriate normative data and may be adjusted based on clinical judgment. Terms such as "Within Normal Limits" and "Outside Normal Limits" are used when a more specific description of the test score cannot be determined.       Percentile - Normative Descriptor > 98 - Exceptionally High 91-97 - Well Above Average 75-90 - Above Average 25-74 - Average 9-24 - Below Average 2-8 - Well Below Average < 2 - Exceptionally Low       Validity:   DESCRIPTOR       Dot Counting Test: --- --- Within Normal Limits  RBANS Effort Index: --- --- Within Normal Limits  WAIS-IV Reliable Digit Span: --- --- Outside Normal Limits       Orientation:      Raw Score Percentile   NAB Orientation, Form 1 29/29 --- ---       Cognitive Screening:      Raw Score Percentile   SLUMS: 12/30 --- ---       RBANS, Form A: Standard Score/ Scaled Score Percentile   Total Score 60 <1 Exceptionally Low  Immediate Memory 49 <1 Exceptionally Low    List Learning 1 <1 Exceptionally Low    Story Memory 3 1 Exceptionally Low  Visuospatial/Constructional 96 39 Average    Figure Copy 10 50 Average    Line Orientation 16/20 26-50 Average  Language 71 3 Well Below Average    Picture Naming 8/10 3-9 Well Below Average    Semantic Fluency 5 5 Well Below Average  Attention 72 3 Well Below Average     Digit Span 7 16 Below Average    Coding 4 2 Well Below Average  Delayed Memory 52 <1 Exceptionally Low    List Recall 0/10 <2 Exceptionally Low    List Recognition 15/20 <2 Exceptionally Low    Story Recall 2 <1 Exceptionally Low    Story Recognition 9/12 16-26 Below Average    Figure Recall 6 9 Below Average    Figure Recognition 6/8 30-52 Average       Intellectual Functioning:      Standard Score Percentile   Test of Premorbid Functioning: 92 30 Average       Attention/Executive Function:     Trail Making Test (TMT): Raw Score (T Score) Percentile     Part A 84 secs.,  2 errors (23) <1 Exceptionally Low    Part B Discontinued --- Impaired         Scaled Score Percentile   WAIS-IV Digit Span: 4 2 Well Below Average    Forward 6 9 Below Average    Backward 6 9 Below Average    Sequencing 5 5 Well Below Average       D-KEFS Color-Word Interference  Test: Raw Score (Scaled Score) Percentile     Color Naming 50 secs. (3) 1 Exceptionally Low    Word Reading 34 secs. (6) 9 Below Average    Inhibition Discontinued --- Impaired    Inhibition/Switching Discontinued --- Impaired       D-KEFS Verbal Fluency Test: Raw Score (Scaled Score) Percentile     Letter Total Correct 15 (4) 2 Well Below Average    Category Total Correct 17 (4) 2 Well Below Average    Category Switching Total Correct 4 (1) <1 Exceptionally Low    Category Switching Accuracy 3 (2) <1 Exceptionally Low      Total Set Loss Errors 4 (8) 25 Average      Total Repetition Errors 1 (12) 75 Above Average       NAB Executive Functions Module, Form 1: T Score Percentile     Judgment 30 2 Well Below Average       Language:      Raw Score Percentile   Sentence Repetition: 10/22 <1 Exceptionally Low       Verbal Fluency Test: Raw Score (T Score) Percentile     Phonemic Fluency (FAS) 15 (28) 2 Well Below Average    Animal Fluency 9 (28) 2 Well Below Average        NAB Language Module, Form 1: Standard Score/ T  Score Percentile   Total Score 73 4 Well Below Average    Oral Production 33 5 Well Below Average    Auditory Comprehension 24 <1 Exceptionally Low    Reading Comprehension 13/13 --- Within Normal Limits    Naming 25/31 (33) 5 Well Below Average    Writing 99 66 Average    Bill Payment 41 18 Below Average       Visuospatial/Visuoconstruction:      Raw Score Percentile   Clock Drawing: 7/10 --- Within Normal Limits        Scaled Score Percentile   WAIS-IV Block Design: 3 1 Exceptionally Low       Mood and Personality:      Raw Score Percentile   Geriatric Depression Scale: 2 --- Within Normal Limits  Geriatric Anxiety Scale: 8 --- Minimal    Somatic 3 --- Minimal    Cognitive 2 --- Minimal    Affective 3 --- Minimal       Additional Questionnaires:      Raw Score Percentile   PROMIS Sleep Disturbance Questionnaire: 18 --- None to Slight   Informed Consent and Coding/Compliance:   The current evaluation represents a clinical evaluation for the purposes previously outlined by the referral source and is in no way reflective of a forensic evaluation.   Ms. Fritzsche was provided with a verbal description of the nature and purpose of the present neuropsychological evaluation. Also reviewed were the foreseeable risks and/or discomforts and benefits of the procedure, limits of confidentiality, and mandatory reporting requirements of this provider. The patient was given the opportunity to ask questions and receive answers about the evaluation. Oral consent to participate was provided by the patient.   This evaluation was conducted by Christia Reading, Ph.D., ABPP-CN, board certified clinical neuropsychologist. Ms. Middlekauff completed a clinical interview with Dr. Melvyn Novas, billed as one unit [phone], and 130 minutes of cognitive testing and scoring, billed as one unit [phone] and three additional units [id number]. Psychometrist Milana Kidney, B.S., assisted Dr. Melvyn Novas with test administration and scoring  procedures. As a separate and discrete service, Dr. Melvyn Novas spent a total of  160 minutes in interpretation and report writing billed as one unit 96132 and two units 96133.

## 2021-02-22 ENCOUNTER — Encounter: Payer: Self-pay | Admitting: Family Medicine

## 2021-02-22 ENCOUNTER — Ambulatory Visit (INDEPENDENT_AMBULATORY_CARE_PROVIDER_SITE_OTHER): Payer: PPO | Admitting: Family Medicine

## 2021-02-22 VITALS — BP 120/66 | HR 78 | Temp 98.5°F | Resp 16 | Ht 66.0 in | Wt 163.6 lb

## 2021-02-22 DIAGNOSIS — G3109 Other frontotemporal dementia: Secondary | ICD-10-CM | POA: Diagnosis not present

## 2021-02-22 DIAGNOSIS — I10 Essential (primary) hypertension: Secondary | ICD-10-CM

## 2021-02-22 DIAGNOSIS — R7989 Other specified abnormal findings of blood chemistry: Secondary | ICD-10-CM

## 2021-02-22 DIAGNOSIS — E782 Mixed hyperlipidemia: Secondary | ICD-10-CM | POA: Diagnosis not present

## 2021-02-22 DIAGNOSIS — E538 Deficiency of other specified B group vitamins: Secondary | ICD-10-CM

## 2021-02-22 DIAGNOSIS — R739 Hyperglycemia, unspecified: Secondary | ICD-10-CM | POA: Diagnosis not present

## 2021-02-22 DIAGNOSIS — D509 Iron deficiency anemia, unspecified: Secondary | ICD-10-CM

## 2021-02-22 DIAGNOSIS — F02A0 Dementia in other diseases classified elsewhere, mild, without behavioral disturbance, psychotic disturbance, mood disturbance, and anxiety: Secondary | ICD-10-CM

## 2021-02-22 DIAGNOSIS — E871 Hypo-osmolality and hyponatremia: Secondary | ICD-10-CM | POA: Diagnosis not present

## 2021-02-22 LAB — HEMOGLOBIN A1C: Hgb A1c MFr Bld: 5.4 % (ref 4.6–6.5)

## 2021-02-22 LAB — COMPREHENSIVE METABOLIC PANEL
ALT: 12 U/L (ref 0–35)
AST: 17 U/L (ref 0–37)
Albumin: 4.1 g/dL (ref 3.5–5.2)
Alkaline Phosphatase: 84 U/L (ref 39–117)
BUN: 11 mg/dL (ref 6–23)
CO2: 29 mEq/L (ref 19–32)
Calcium: 9.8 mg/dL (ref 8.4–10.5)
Chloride: 102 mEq/L (ref 96–112)
Creatinine, Ser: 1.02 mg/dL (ref 0.40–1.20)
GFR: 53.97 mL/min — ABNORMAL LOW (ref >60.00)
Glucose, Bld: 84 mg/dL (ref 70–99)
Potassium: 3.6 mEq/L (ref 3.5–5.1)
Sodium: 140 mEq/L (ref 135–145)
Total Bilirubin: 0.8 mg/dL (ref 0.2–1.2)
Total Protein: 7.1 g/dL (ref 6.0–8.3)

## 2021-02-22 LAB — CBC
HCT: 41.2 % (ref 36.0–46.0)
Hemoglobin: 13.9 g/dL (ref 12.0–15.0)
MCHC: 33.7 g/dL (ref 30.0–36.0)
MCV: 87.9 fl (ref 78.0–100.0)
Platelets: 213 10*3/uL (ref 150.0–400.0)
RBC: 4.69 Mil/uL (ref 3.87–5.11)
RDW: 19.3 % — ABNORMAL HIGH (ref 11.5–15.5)
WBC: 5.6 10*3/uL (ref 4.0–10.5)

## 2021-02-22 LAB — VITAMIN B12: Vitamin B-12: 327 pg/mL (ref 211–911)

## 2021-02-22 LAB — TSH: TSH: 1.28 u[IU]/mL (ref 0.35–5.50)

## 2021-02-22 LAB — LIPID PANEL
Cholesterol: 147 mg/dL (ref 0–200)
HDL: 43.3 mg/dL (ref >39.00)
LDL Cholesterol: 83 mg/dL (ref 0–99)
NonHDL: 104.03
Total CHOL/HDL Ratio: 3
Triglycerides: 106 mg/dL (ref 0.0–149.0)
VLDL: 21.2 mg/dL (ref 0.0–40.0)

## 2021-02-22 LAB — VITAMIN D 25 HYDROXY (VIT D DEFICIENCY, FRACTURES): VITD: 27.79 ng/mL — ABNORMAL LOW (ref 30.00–100.00)

## 2021-02-22 NOTE — Assessment & Plan Note (Signed)
Supplement and monitor 

## 2021-02-22 NOTE — Progress Notes (Signed)
Patient ID: Erika [name], female    DOB: February 09, 1946  Age: 75 y.o. MRN: [id number]    Subjective:   Chief Complaint  Patient presents with   Follow-up   Subjective   HPI [name] presents for office visit today for follow up on htn and memory issues. Physically speak, she is doing well and has no recent Febrile illnesses or recent ER visits to report. She has had neurological testing done yesterday. Her husband endorses her memory has worsened gradually over a couple of months, but at the moment it is stable. Denies CP/palp/SOB/HA/congestion/fevers/GI or GU c/o. Taking meds as prescribed.   Review of Systems  Constitutional:  Negative for chills, fatigue and fever.  HENT:  Negative for congestion, rhinorrhea, sinus pressure, sinus pain and sore throat.   Eyes:  Negative for pain.  Respiratory:  Negative for cough and shortness of breath.   Cardiovascular:  Negative for chest pain, palpitations and leg swelling.  Gastrointestinal:  Negative for abdominal pain, blood in stool, diarrhea, nausea and vomiting.  Genitourinary:  Negative for decreased urine volume, flank pain, frequency, vaginal bleeding and vaginal discharge.  Musculoskeletal:  Negative for back pain.  Neurological:  Negative for headaches.   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   Cerebrovascular disease    Chest pain 01/19/2014   Chronic rhinitis 01/13/2009   Diverticulosis of colon    colonoscopy 04/14/1999   Dry mouth 03/13/2012   Fatigue 08/02/2014   GERD (gastroesophageal reflux disease)    Headache 02/11/2018   History of chicken pox    as a child   History of fibrocystic disease of breast    History of measles    as a child   History of mumps    as a child   Hyperlipidemia, mixed 02/20/2007   Goal LDL < 130 HBP/Pos fm  hx   Hypertension    Hypokalemia 06/24/2012   Hyponatremia 08/02/2014   IBS (irritable bowel syndrome) 04/02/2011   IDA (iron deficiency anemia) 12/28/2020   Low vitamin D level 01/09/2016   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   Her family history includes Alcohol abuse in her son; Aneurysm in her brother; Bipolar disorder in her son; Heart attack (age of onset: 47) in her father; Heart disease in her brother and father; Hyperlipidemia in her brother and mother; Hypertension in her brother, father, and mother; Mental illness in her son; Multiple sclerosis in her daughter; Schizophrenia in her son; Stroke (age of onset: 36) in her mother.She reports that she has never smoked. She has never used smokeless tobacco. She reports that she does not drink alcohol and does not use drugs.  Current Outpatient Medications on File Prior to Visit  Medication Sig Dispense Refill   amLODipine (NORVASC) 10 MG tablet TAKE  0.5 TABLETS BY MOUTH 2 TIMES DAILY. 90 tablet 1   aspirin EC 81 MG tablet Take 1 tablet (81 mg total) by mouth daily.     calcium carbonate (TUMS - DOSED IN MG ELEMENTAL CALCIUM) 500 MG chewable tablet Chew 1 tablet by mouth daily.     famotidine (PEPCID) 40 MG tablet Take 1 tablet (40 mg total) by mouth at bedtime as needed for heartburn or indigestion.  90 tablet 3   furosemide (LASIX) 20 MG tablet TAKE 0.5 TABLETS (10 MG TOTAL) BY MOUTH DAILY AS NEEDED FOR EDEMA. 45 tablet 1   metoprolol succinate (TOPROL-XL) 50 MG 24 hr tablet TAKE 1 TABLET BY MOUTH TWICE A DAY WITH OR IMMEDIATELY FOLLOWING A MEAL 180 tablet 2   Multiple Minerals-Vitamins (CITRACAL PLUS PO) Take 1 tablet by mouth daily.     rosuvastatin (CRESTOR) 20 MG tablet Take 1 tablet (20 mg total) by mouth daily. 90 tablet 3   trospium (SANCTURA) 20 MG tablet Take 1 tablet (20 mg total) by mouth daily. 90 tablet 1   UNABLE TO FIND COGNITIVE TABLET DAILY     valsartan (DIOVAN) 320 MG tablet TAKE 1 TABLET BY MOUTH EVERY DAY 90 tablet 1   folic acid (FOLVITE) 1 MG tablet Take 1 tablet (1 mg total) by mouth daily. (Patient not taking: Reported on 02/22/2021) 30 tablet 6   Current Facility-Administered Medications on File Prior to Visit  Medication Dose Route Frequency Provider Last Rate Last Admin   cyanocobalamin ((VITAMIN B-12)) injection 1,000 mcg  1,000 mcg Intramuscular Q30 days Carollee Herter, Kendrick Fries R, DO   1,000 mcg at 10/27/20 1052     Objective:  Objective  Physical Exam Constitutional:      General: She is not in acute distress.    Appearance: Normal appearance. She is not ill-appearing or toxic-appearing.  HENT:     Head: Normocephalic and atraumatic.     Right Ear: Tympanic membrane, ear canal and external ear normal.     Left Ear: Tympanic membrane, ear canal and external ear normal.     Nose: No congestion or rhinorrhea.  Eyes:     Extraocular Movements: Extraocular movements intact.     Pupils: Pupils are equal, round, and reactive to light.  Cardiovascular:     Rate and Rhythm: Normal rate and regular rhythm.     Pulses: Normal pulses.     Heart sounds: Normal heart sounds. No murmur heard. Pulmonary:     Effort: Pulmonary effort is normal. No respiratory distress.     Breath sounds: Normal breath sounds. No wheezing, rhonchi or rales.  Abdominal:     General:  Bowel sounds are normal.     Palpations: Abdomen is soft. There is no mass.     Tenderness: There is no abdominal tenderness. There is no guarding.     Hernia: No hernia is present.  Musculoskeletal:        General: Normal range of motion.     Cervical back: Normal range of motion and neck supple.  Skin:    General: Skin is warm and dry.  Neurological:     Mental Status: She is alert and oriented to person, place, and time.  Psychiatric:        Behavior: Behavior normal.   BP 120/66    Pulse 78    Temp 98.5 F (36.9 C)    Resp 16    Ht 5\' 6"  (1.676 m)    Wt 163 lb 9.6 oz (74.2  kg)    SpO2 91%    BMI 26.41 kg/m  Wt Readings from Last 3 Encounters:  02/22/21 163 lb 9.6 oz (74.2 kg)  12/28/20 167 lb 12.8 oz (76.1 kg)  10/15/20 168 lb 6.4 oz (76.4 kg)     Lab Results  Component Value Date   WBC 5.6 02/22/2021   HGB 13.9 02/22/2021   HCT 41.2 02/22/2021   PLT 213.0 02/22/2021   GLUCOSE 84 02/22/2021   CHOL 147 02/22/2021   TRIG 106.0 02/22/2021   HDL 43.30 02/22/2021   LDLDIRECT 163.0 07/24/2014   LDLCALC 83 02/22/2021   ALT 12 02/22/2021   AST 17 02/22/2021   NA 140 02/22/2021   K 3.6 02/22/2021   CL 102 02/22/2021   CREATININE 1.02 02/22/2021   BUN 11 02/22/2021   CO2 29 02/22/2021   TSH 1.28 02/22/2021   HGBA1C 5.4 02/22/2021    MR BRAIN WO CONTRAST  Result Date: 10/04/2020 CLINICAL DATA:  Memory loss R41.3 (ICD-10-CM). Memory Loss; 15-month history of memory loss, recent mood irritability and confusion. B12 deficiency E53.8 (ICD-10-CM). EXAM: MRI HEAD WITHOUT CONTRAST TECHNIQUE: Multiplanar, multiecho pulse sequences of the brain and surrounding structures were obtained without intravenous contrast. COMPARISON:  Head CT September 16, 2020. FINDINGS: Brain: No acute infarction, hemorrhage, hydrocephalus, extra-axial collection or mass lesion. Scattered and confluent foci of T2 hyperintensity are seen within the white matter of the cerebral hemispheres and within the pons,  nonspecific, most likely related to chronic small vessel ischemia. Vascular: Normal flow voids.  Hypoplastic vertebrobasilar system. Skull and upper cervical spine: Normal marrow signal. Sinuses/Orbits: Bilateral lens surgery. Paranasal sinuses are essentially clear. Other: None. IMPRESSION: 1. No acute intracranial abnormality. 2. Moderate chronic microvascular ischemic changes of the white matter. Electronically Signed   By: Pedro Earls M.D.   On: 10/04/2020 08:35     Assessment & Plan:  Plan    No orders of the defined types were placed in this encounter.   Problem List Items Addressed This Visit     Hyperlipidemia, mixed - Primary    Encourage heart healthy diet such as MIND or DASH diet, increase exercise, avoid trans fats, simple carbohydrates and processed foods, consider a krill or fish or flaxseed oil cap daily.       Relevant Orders   Lipid panel (Completed)   Hypertension    Well controlled, no changes to meds. Encouraged heart healthy diet such as the DASH diet and exercise as tolerated.       Relevant Orders   CBC (Completed)   Comprehensive metabolic panel (Completed)   TSH (Completed)   Hyponatremia   Low vitamin D level    Supplement and monitor      Relevant Orders   VITAMIN D 25 Hydroxy (Vit-D Deficiency, Fractures) (Completed)   Vitamin B 12 deficiency    Supplement and monitor      Relevant Orders   Vitamin B12 (Completed)   IDA (iron deficiency anemia)    Does not tolerate oral supplements well but did tolerate infusion. 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 Has completed her neuropsych testing and is awaiting results.      Other Visit Diagnoses     Hyperglycemia  Relevant Orders   Hemoglobin  A1c (Completed)       Follow-up: Return in about 11 weeks (around 05/10/2021) for f/u visit.  I, Suezanne Jacquet, acting as a scribe for Penni Homans, MD, have documented all relevent documentation on behalf of Penni Homans, MD, as directed by Penni Homans, MD while in the presence of Penni Homans, MD. DO:02/23/21.  I, [name], MD personally performed the services described in this documentation. All medical record entries made by the scribe were at my direction and in my presence. I have reviewed the chart and agree that the record reflects my personal performance and is accurate and complete

## 2021-02-22 NOTE — Patient Instructions (Signed)

## 2021-02-23 LAB — IRON,TIBC AND FERRITIN PANEL
%SAT: 11 % (calc) — ABNORMAL LOW (ref 16–45)
Ferritin: 85 ng/mL (ref 16–288)
Iron: 35 ug/dL — ABNORMAL LOW (ref 45–160)
TIBC: 317 mcg/dL (calc) (ref 250–450)

## 2021-02-23 NOTE — Assessment & Plan Note (Signed)
Encourage heart healthy diet such as MIND or DASH diet, increase exercise, avoid trans fats, simple carbohydrates and processed foods, consider a krill or fish or flaxseed oil cap daily.  °

## 2021-02-23 NOTE — Assessment & Plan Note (Signed)
Does not tolerate oral supplements well but did tolerate infusion. Will continue to monitor

## 2021-02-23 NOTE — Assessment & Plan Note (Signed)
Well controlled, no changes to meds. Encouraged heart healthy diet such as the DASH diet and exercise as tolerated.  °

## 2021-02-23 NOTE — Assessment & Plan Note (Signed)
Is in with her husband and they do report she is stable but anxious about her memory concerns. Has completed her neuropsych testing and is awaiting results.

## 2021-02-23 NOTE — Assessment & Plan Note (Signed)
Supplement and monitor 

## 2021-03-01 ENCOUNTER — Ambulatory Visit (INDEPENDENT_AMBULATORY_CARE_PROVIDER_SITE_OTHER): Payer: PPO | Admitting: Psychology

## 2021-03-01 ENCOUNTER — Other Ambulatory Visit: Payer: Self-pay

## 2021-03-01 DIAGNOSIS — I679 Cerebrovascular disease, unspecified: Secondary | ICD-10-CM

## 2021-03-01 DIAGNOSIS — G3109 Other frontotemporal dementia: Secondary | ICD-10-CM | POA: Diagnosis not present

## 2021-03-01 DIAGNOSIS — F02A0 Dementia in other diseases classified elsewhere, mild, without behavioral disturbance, psychotic disturbance, mood disturbance, and anxiety: Secondary | ICD-10-CM

## 2021-03-01 NOTE — Progress Notes (Signed)
° °  Neuropsychology Feedback Session Tillie Rung. [place] of Neurology  Reason for Referral:   Erika [name] is a 75 y.o. right-handed Caucasian female referred by  [name], M.D. , to characterize her current cognitive functioning and assist with diagnostic clarity and treatment planning in the context of subjective cognitive decline and prior medical records suggesting mild cognitive impairment.   Feedback:   Erika [name] completed a comprehensive neuropsychological evaluation on 02/21/2021. Please refer to that encounter for the full report and recommendations. Briefly, results suggested severe impairment surrounding executive functioning (including safety and judgment), receptive and expressive language, and nearly all aspects of verbal learning and memory. Relative weaknesses were also exhibited across processing speed, complex attention (i.e., working memory), visuospatial abilities, and aspects of visual memory. The etiology for ongoing dysfunction is difficult to discern and likely multifactorial in nature. Based upon testing results, prominent and severe impairment surrounding executive functioning and receptive/expressive language certainly raises the concern for the presence of frontotemporal dementia. While she does not exhibit disinhibited features of the behavioral variant of this disease process, language dysfunction certainly raises concerns for a language variant (i.e., primary progressive aphasia presentation). 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 Given verbal memory impairment, there are additional concerns surrounding Alzheimer's disease. However, Erika [name] was able to recognize previously learned story and figure based information reasonably well after a delay. This does not  suggest a consistent memory storage deficit, which would make this condition somewhat less likely. It is important to highlight that logopenic aphasia presentations often share the same neuropathology as Alzheimer's disease and that these two conditions can have notably overlapping symptoms as a result.  Erika [name] was accompanied by her husband during the current feedback session. Content of the current session focused on the results of her neuropsychological evaluation. Erika [name] was given the opportunity to ask questions and her questions were answered. She was encouraged to reach out should additional questions arise. A copy of her report was provided at the conclusion of the visit.      33 minutes were spent conducting the current feedback session with Erika [name], billed as one unit [phone].

## 2021-03-02 ENCOUNTER — Telehealth: Payer: Self-pay | Admitting: *Deleted

## 2021-03-02 ENCOUNTER — Encounter: Payer: Self-pay | Admitting: Family

## 2021-03-02 ENCOUNTER — Other Ambulatory Visit: Payer: Self-pay

## 2021-03-02 ENCOUNTER — Inpatient Hospital Stay: Payer: PPO | Admitting: Family

## 2021-03-02 ENCOUNTER — Inpatient Hospital Stay: Payer: PPO | Attending: Hematology & Oncology

## 2021-03-02 VITALS — BP 198/54 | HR 73 | Temp 98.2°F | Resp 18 | Ht 66.0 in | Wt 165.4 lb

## 2021-03-02 DIAGNOSIS — E7211 Homocystinuria: Secondary | ICD-10-CM | POA: Insufficient documentation

## 2021-03-02 DIAGNOSIS — I10 Essential (primary) hypertension: Secondary | ICD-10-CM | POA: Diagnosis not present

## 2021-03-02 DIAGNOSIS — Z7982 Long term (current) use of aspirin: Secondary | ICD-10-CM | POA: Insufficient documentation

## 2021-03-02 DIAGNOSIS — D509 Iron deficiency anemia, unspecified: Secondary | ICD-10-CM

## 2021-03-02 DIAGNOSIS — D529 Folate deficiency anemia, unspecified: Secondary | ICD-10-CM | POA: Diagnosis not present

## 2021-03-02 DIAGNOSIS — E538 Deficiency of other specified B group vitamins: Secondary | ICD-10-CM | POA: Insufficient documentation

## 2021-03-02 DIAGNOSIS — Z79899 Other long term (current) drug therapy: Secondary | ICD-10-CM | POA: Diagnosis not present

## 2021-03-02 LAB — RETICULOCYTES
Immature Retic Fract: 8.5 % (ref 2.3–15.9)
RBC.: 4.7 MIL/uL (ref 3.87–5.11)
Retic Count, Absolute: 63.5 10*3/uL (ref 19.0–186.0)
Retic Ct Pct: 1.4 % (ref 0.4–3.1)

## 2021-03-02 LAB — CBC WITH DIFFERENTIAL (CANCER CENTER ONLY)
Abs Immature Granulocytes: 0.04 10*3/uL (ref 0.00–0.07)
Basophils Absolute: 0.1 10*3/uL (ref 0.0–0.1)
Basophils Relative: 1 %
Eosinophils Absolute: 0.2 10*3/uL (ref 0.0–0.5)
Eosinophils Relative: 3 %
HCT: 41.4 % (ref 36.0–46.0)
Hemoglobin: 14.2 g/dL (ref 12.0–15.0)
Immature Granulocytes: 1 %
Lymphocytes Relative: 41 %
Lymphs Abs: 2.4 10*3/uL (ref 0.7–4.0)
MCH: 30.1 pg (ref 26.0–34.0)
MCHC: 34.3 g/dL (ref 30.0–36.0)
MCV: 87.9 fL (ref 80.0–100.0)
Monocytes Absolute: 0.4 10*3/uL (ref 0.1–1.0)
Monocytes Relative: 7 %
Neutro Abs: 2.7 10*3/uL (ref 1.7–7.7)
Neutrophils Relative %: 47 %
Platelet Count: 246 10*3/uL (ref 150–400)
RBC: 4.71 MIL/uL (ref 3.87–5.11)
RDW: 15.1 % (ref 11.5–15.5)
WBC Count: 5.9 10*3/uL (ref 4.0–10.5)
nRBC: 0 % (ref 0.0–0.2)

## 2021-03-02 NOTE — Telephone Encounter (Signed)
Per 03/02/21 los - gave upcoming appointments

## 2021-03-02 NOTE — Progress Notes (Signed)
Hematology and Oncology Follow Up Visit  Erika [name] [id number] [birth year] 75 y.o. 03/02/2021   Principle Diagnosis:  Iron deficiency anemia  Elevated homocystine level   Current Therapy:   IV iron as indicated  Folic acid 1 mg PO Daily   Interim History:  Erika [name] is here today with her husband for follow-up. She was recently diagnosed with dementia after neuro psych eval with Dr. Melvyn Novas. She has an appointment in January with Dr. Guadelupe Sabin to discuss a treatment plan.  She is having trouble with word recall and losing her train of thought This has been an adjustment for them but thankfully they have a very supportive family.  She has not noted any blood loss. Her stool had been dark while on an oral iron supplement but not since stopping. She did well with her IV iron infusion.  Hgb is now 14.2, MCV 87, platelets 246 and WBC count 5.9.  She is taking her folic acid daily as prescribed. Homocystine level pending.  No fever, chills, n/v, cough, rash, dizziness, SOB, chest pain, palpitations, abdominal pain or changes in bowel or bladder habits.  No swelling, tenderness, numbness or tingling in her extremities.  No falls or syncope to report.  She is eating well and staying hydrated throughout the day.  Her weight is stable at 165 lbs.   ECOG Performance Status: 1 - Symptomatic but completely ambulatory  Medications:  Allergies as of 03/02/2021   No Known Allergies      Medication List        Accurate as of March 02, 2021  4:16 PM. If you have any questions, ask your nurse or doctor.          amLODipine 10 MG tablet Commonly known as: NORVASC TAKE 0.5 TABLETS BY MOUTH 2 TIMES DAILY.   aspirin EC 81 MG tablet Take 1 tablet (81 mg total) by mouth daily.   calcium carbonate 500 MG chewable tablet Commonly known as: TUMS - dosed in mg elemental calcium Chew 1 tablet by mouth daily.   CITRACAL PLUS PO Take 1 tablet by mouth daily.   famotidine 40 MG  tablet Commonly known as: PEPCID Take 1 tablet (40 mg total) by mouth at bedtime as needed for heartburn or indigestion.   folic acid 1 MG tablet Commonly known as: FOLVITE Take 1 tablet (1 mg total) by mouth daily.   furosemide 20 MG tablet Commonly known as: LASIX TAKE 0.5 TABLETS (10 MG TOTAL) BY MOUTH DAILY AS NEEDED FOR EDEMA.   metoprolol succinate 50 MG 24 hr tablet Commonly known as: TOPROL-XL TAKE 1 TABLET BY MOUTH TWICE A DAY WITH OR IMMEDIATELY FOLLOWING A MEAL   rosuvastatin 20 MG tablet Commonly known as: CRESTOR Take 1 tablet (20 mg total) by mouth daily.   trospium 20 MG tablet Commonly known as: SANCTURA Take 1 tablet (20 mg total) by mouth daily.   UNABLE TO FIND COGNITIVE TABLET DAILY   valsartan 320 MG tablet Commonly known as: DIOVAN TAKE 1 TABLET BY MOUTH EVERY DAY        Allergies: No Known Allergies  Past Medical History, Surgical history, Social history, and Family History were reviewed and updated.  Review of Systems: All other 10 point review of systems is negative.   Physical Exam:  height is 5\' 6"  (1.676 m) and weight is 165 lb 6.4 oz (75 kg). Her oral temperature is 98.2 F (36.8 C). Her blood pressure is 198/54 (abnormal) and her pulse is 73.  Her respiration is 18 and oxygen saturation is 100%.   Wt Readings from Last 3 Encounters:  03/02/21 165 lb 6.4 oz (75 kg)  02/22/21 163 lb 9.6 oz (74.2 kg)  12/28/20 167 lb 12.8 oz (76.1 kg)    Ocular: Sclerae unicteric, pupils equal, round and reactive to light Ear-nose-throat: Oropharynx clear, dentition fair Lymphatic: No cervical or supraclavicular adenopathy Lungs no rales or rhonchi, good excursion bilaterally Heart regular rate and rhythm, no murmur appreciated Abd soft, nontender, positive bowel sounds MSK no focal spinal tenderness, no joint edema Neuro: non-focal, well-oriented, appropriate affect Breasts: Deferred   Lab Results  Component Value Date   WBC 5.9 03/02/2021    HGB 14.2 03/02/2021   HCT 41.4 03/02/2021   MCV 87.9 03/02/2021   PLT 246 03/02/2021   Lab Results  Component Value Date   FERRITIN 85 02/22/2021   IRON 35 (L) 02/22/2021   TIBC 317 02/22/2021   UIBC 384 12/28/2020   IRONPCTSAT 11 (L) 02/22/2021   Lab Results  Component Value Date   RETICCTPCT 1.4 03/02/2021   RBC 4.70 03/02/2021   No results found for: KPAFRELGTCHN, LAMBDASER, KAPLAMBRATIO No results found for: IGGSERUM, IGA, IGMSERUM No results found for: Odetta Pink, SPEI   Chemistry      Component Value Date/Time   NA 140 02/22/2021 1155   K 3.6 02/22/2021 1155   CL 102 02/22/2021 1155   CO2 29 02/22/2021 1155   BUN 11 02/22/2021 1155   CREATININE 1.02 02/22/2021 1155   CREATININE 1.19 (H) 12/28/2020 0846   CREATININE 1.23 (H) 11/18/2019 0932      Component Value Date/Time   CALCIUM 9.8 02/22/2021 1155   ALKPHOS 84 02/22/2021 1155   AST 17 02/22/2021 1155   AST 15 12/28/2020 0846   ALT 12 02/22/2021 1155   ALT 11 12/28/2020 0846   BILITOT 0.8 02/22/2021 1155   BILITOT 0.7 12/28/2020 0846       Impression and Plan: Erika [name] is a very pleasant 75 yo caucasian female with history of multifactorial anemia including iron deficiency and B 12 deficiency (managed by PCP). She also had a slightly elevated homocystine level. Iron studies are pending.  She is taking her folic acid daily as prescribed.  Follow-up in 3 months.   Erika Dawson, NP 12/28/20224:16 PM

## 2021-03-03 LAB — IRON AND IRON BINDING CAPACITY (CC-WL,HP ONLY)
Iron: 75 ug/dL (ref 28–170)
Saturation Ratios: 23 % (ref 10.4–31.8)
TIBC: 325 ug/dL (ref 250–450)
UIBC: 250 ug/dL (ref 148–442)

## 2021-03-03 LAB — HOMOCYSTEINE: Homocysteine: 9.6 umol/L (ref 0.0–19.2)

## 2021-03-03 LAB — FERRITIN: Ferritin: 112 ng/mL (ref 11–307)

## 2021-03-07 ENCOUNTER — Other Ambulatory Visit: Payer: Self-pay | Admitting: Family Medicine

## 2021-03-28 ENCOUNTER — Encounter: Payer: Self-pay | Admitting: Neurology

## 2021-03-28 ENCOUNTER — Ambulatory Visit: Payer: PPO | Admitting: Neurology

## 2021-03-28 DIAGNOSIS — F0390 Unspecified dementia without behavioral disturbance: Secondary | ICD-10-CM

## 2021-03-28 MED ORDER — DONEPEZIL HCL 10 MG PO TABS: 5.0000 mg | ORAL_TABLET | Freq: Every day | ORAL | 3 refills | Status: DC

## 2021-03-28 NOTE — Progress Notes (Signed)
Subjective:    Patient ID: Erika [name] is a 76 y.o. female.  HPI    Interim history:   Erika [name] is a 76 year old right-handed woman with an underlying medical history of allergies, arthritis, reflux disease, hypertension, hyperlipidemia, irritable bowel syndrome, sleep apnea, mildly overweight state, and vitamin B12 deficiency, who presents for follow-up consultation of her memory loss.  The patient is accompanied by her husband today; her daughter is on speaker phone.  I saw her on 09/30/2020 at the request of her primary care physician, at which time she reported a several month history of short-term memory issues, including forgetfulness, losing train of thought, word finding difficulty.  Her MMSE was 18 at the time.  She had recently been found to have B12 deficiency.  She was advised to proceed with B12 supplementation and also proceed with neuropsychological evaluation for in-depth cognitive testing. She was advised to proceed with a brain MRI.  She had a brain MRI without contrast on 10/04/2020 and I reviewed the results: IMPRESSION: 1. No acute intracranial abnormality. 2. Moderate chronic microvascular ischemic changes of the white matter.  She was notified of her test results.   She had neuropsychological Evaluation and testing with Dr. Hazle Coca on 02/21/21 and a subsequent FU appt on 03/01/21. I reviewed the results and recommendations:   << Clinical Impression(s): Ms. Glas pattern of performance is suggestive of severe impairment surrounding executive functioning (including safety and judgment), receptive and expressive language, and nearly all aspects of verbal learning and memory. Relative weaknesses were also exhibited across processing speed, complex attention (i.e., working memory), visuospatial abilities, and aspects of visual memory. Performance was appropriate across basic attention. Regarding ADLs, Erika [name]'s husband manages all medication, financial, and  bill paying responsibilities. There were reports of Erika [name] overmedicating herself prior to him taking the former over. While she described herself as a good driver, her husband expressed concerns surrounding her getting lost. 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   The etiology for ongoing dysfunction is difficult to discern and likely multifactorial in nature. Based upon testing results, prominent and severe impairment surrounding executive functioning and receptive/expressive language certainly raises the concern for the presence of frontotemporal dementia. While she does not exhibit disinhibited features of the behavioral variant of this disease process, language dysfunction certainly raises concerns for a language variant (i.e., primary progressive aphasia presentation). Of common language variant subtypes, Erika [name] does not demonstrate non-fluent, halting speech, nor does she exhibit strong evidence for diminished semantic knowledge, making non-fluent primary progressive aphasia and semantic dementia presentations unlikely. 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 While her age is somewhat advanced for onset of this condition, it is unclear how long these symptoms have been developing.    Given verbal memory impairment, there are additional concerns surrounding Alzheimer's disease. However, Erika [name] was able to recognize previously learned story and figure based information reasonably well after a delay. This does not suggest a consistent memory storage deficit, which would make this condition somewhat less likely. It is important to highlight that logopenic aphasia  presentations often share the same neuropathology as Alzheimer's disease and that these two conditions can have notably overlapping symptoms as a result.   Outside of these causes, there is likely a  vascular contribution to her overall presentation given recent neuroimaging suggesting stable moderate to severe small vessel ischemic disease. However, current deficits are far beyond what would be expected from these cerebrovascular changes alone. She does not exhibit behavioral features of Lewy body dementia or other more rare parkinsonian conditions at the present time. Continued medical monitoring will be important moving forward.    Recommendations: Erika [name] should discuss medications aimed to address memory loss and overall cognitive decline with Dr. Rexene Alberts. It is important to highlight that this medication has been shown to slow functional decline in some individuals. There is no current treatment which can stop or reverse cognitive decline when caused by a neurodegenerative illness.    Additional neuroimaging in the form of a FDG-PET scan would likely be useful in providing further diagnostic clarity regarding concerns versus dementia due to Alzheimer's disease or frontotemporal dementia.    Admittedly, performance across neurocognitive testing is not a strong predictor of an individual's safety operating a motor vehicle. However, given the extent of ongoing impairment, I would recommend that she abstain from driving pending a formal driving evaluation. Should her family wish to pursue a formalized driving evaluation, they could reach out to the following agencies: [employer] in [place]: [phone] [facility]: [facility]: [place]: [phone] or [phone]   Should there be further progression of current deficits over time, Erika [name] is unlikely to regain any independent living skills lost. She will likely  benefit from the establishment and maintenance of a routine in order to maximize her functional abilities over time.   It will be important for her to have another person with her when in situations where she may need to process information, weigh the pros and cons of different options, and make decisions, in order to ensure that she fully understands and recalls all information to be considered.   If not already done, Erika [name] and her family may want to discuss her wishes regarding durable power of attorney and medical decision making, so that she can have input into these choices. Additionally, they may wish to discuss future plans for caretaking and seek out community options for in home/residential care should they become necessary.   Erika [name] is encouraged to attend to lifestyle factors for brain health (e.g., regular physical exercise, good nutrition habits, regular participation in cognitively-stimulating activities, and general stress management techniques), which are likely to have benefits for both emotional adjustment and cognition. Optimal control of vascular risk factors (including safe cardiovascular exercise and adherence to dietary recommendations) is encouraged. Likewise, continued compliance with her CPAP machine will also be important. Continued participation in activities which provide mental stimulation and social interaction is also recommended.    Important information should be provided to Erika [name] in written format in all instances. This information should be placed in a highly frequented and easily visible location within her home to promote recall. External strategies such as written notes in a consistently used memory journal, visual and nonverbal auditory cues such as a calendar on the refrigerator or appointments with alarm, such as on a cell phone, can also help maximize recall.   Presenting information in short, concrete sentences will be helpful to ensure her  comprehension. It will also be important to have her paraphrase back information rather than simply repeat to allow those working with her to ensure she understands what is being asked of her and/or told to her.   To address problems with processing  speed, she may wish to consider:   -Ensuring that she is alerted when essential material or instructions are being presented   -Adjusting the speed at which new information is presented   -Allowing for more time in comprehending, processing, and responding in conversation   To address problems with fluctuating attention, she may wish to consider:   -Avoiding external distractions when needing to concentrate   -Limiting exposure to fast paced environments with multiple sensory demands   -Writing down complicated information and using checklists   -Attempting and completing one task at a time (i.e., no multi-tasking)   -Verbalizing aloud each step of a task to maintain focus   -Reducing the amount of information considered at one time>>.  Today, 03/28/2021: She reports having had more difficulty remembering new things, word finding difficulty, losing train of thought.  Her husband indicates that she has limited her driving, she endorses that she gets confused while driving.  Her daughter has noticed significant difficulty finding words.  She is questioning the possibility of speech therapy.  She has a history of iron deficiency and B12 deficiency.  Daughter reports that she has not been eating as well.  Patient reports sleeping fairly well but both her husband and daughter are concerned that she is very sleepy during the day.  She drinks caffeine in the form of tea, 2 to 3 cups/day, no alcohol currently.  She has not started any new medications.  Previously:   09/30/20: (She) reports problems with her short-term memory for the past approximately 6 months.  She is particularly bothered by her word finding difficulty and losing her train of thought.  She has  had short-term memory issues including forgetfulness.  Some 6 weeks ago her daughter reports patient had a more sudden decline in memory function, seemed confused, was irritable, and this subsequently subsided.  She does not have any sustained anxiety or depression.  She has recently started B12 injections.  She is not necessarily physically very active other than working in the yard but daughter is concerned that formal exercise is lacking.  Patient tries to hydrate well with water.  She is a non-smoker, no telltale family history of Alzheimer's dementia or dementia in general.  Daughter recently did a Lucas and patient scored 16 out of 17. She has not had any recent falls.  She has had some difficulty using her CPAP machine but does use it regularly. She does not drink any alcohol.  She drinks very little caffeine, likes to drink decaf sweet tea.  She does drink several servings of this per day.  Thus far, she has had a couple of B12 injections as she was found to have low B12 recently.   She has a good psychosocial support system.  She lives with her husband.  She has 2 biological daughters and 2 adopted sons.  She is very much involved in their families and with her grandchildren.  She likes to travel.   I reviewed your office note from 09/16/2020.  She had blood work at the time which I was able to review in the chart.  Orthopedic homocystine was mildly elevated, ESR borderline elevated at 32, CMP showed sodium of 139, potassium 4.2, glucose 103, BUN 12, creatinine 1.8, AST 16, ALT 12, CBC with differential showed WBC of 4.8, hemoglobin 11.2, hematocrit 34.6, MCV 77.2, mildly low, RPR nonreactive, B12 low at 175, folate normal at 13.1.  She was started on B12 injections. You ordered a [place]. She had a head  CT without contrast on 09/16/2020 and I reviewed the results: IMPRESSION: Chronic small vessel disease throughout the deep white matter.   No acute intracranial abnormality.         I had previously  evaluated her about 2-1/2 years ago for recurrent headaches and left-sided weakness.  She had improved by the time I saw her.  She was in the process of being evaluated for sleep apnea.  I ordered a brain MRI.  She had a brain MRI without contrast on 04/27/2018 and I reviewed the results:  IMPRESSION: 1. No acute intracranial process. 2. Moderate to severe chronic small vessel ischemic changes.  We called her with her test results.   She had interim sleep evaluation with pulmonology. She is on PAP therapy and had a FU with the NP in 03/22.   Previously:    04/23/18: 76 year old right-handed woman with an underlying medical history of arthritis, particularly shoulder pain bilaterally, osteopenia, irritable bowel syndrome, hypertension, hyperlipidemia, reflux disease, diverticulosis, and obesity, who reports episodic headaches in the fall of last year and also had episodes of dizziness and some weakness in the L hand in December, which lasted maybe for seconds, all of which have improved. She currently feels at baseline. She does not smoke, does not typically utilize alcohol, drinks caffeine regularly, in the form of sweet tea at least 4-5 cups per day and one cup of coffee in the morning typically. She admits that she does not typically drink a lot of water. She has not had an MRI brain, as far as I can see. She may have had some difficulty speaking, also for seconds (?). She has a FHx of stroke in her mother. She has been sleepy during the day. She has had a sleep evaluation and has sleep test pending. Has been quite busy, retired as Copywriter, advertising some 4 years ago. 2 daughters live next door practically, 2 adopted sons. She has 6 GC, takes care of 5 of her GC on a daily basis or helps out in one form or another. She has had stressors. She has had some memory issues.   I reviewed your office note from 02/11/2018. 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   No flow-limiting intracranial stenosis is evident. She is in the process of being evaluated for sleep apnea.    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   Cerebrovascular disease    Chest pain 01/19/2014   Chronic rhinitis 01/13/2009   Diverticulosis of colon    colonoscopy 04/14/1999   Dry mouth 03/13/2012   Fatigue 08/02/2014   GERD (gastroesophageal reflux disease)    Headache 02/11/2018   History of chicken pox    as a child   History of fibrocystic disease of breast    History of measles    as a child   History of mumps    as a child   Hyperlipidemia, mixed 02/20/2007   Goal LDL < 130 HBP/Pos fm hx   Hypertension    Hypokalemia 06/24/2012   Hyponatremia 08/02/2014   IBS (irritable bowel syndrome) 04/02/2011   IDA (iron  deficiency anemia) 12/28/2020   Low vitamin D level 01/09/2016   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   Obstructive sleep apnea 05/23/2017   uses CPAP nightly   Osteopenia after menopause    Overactive bladder 04/02/2011   Pedal edema 05/20/2020   Shoulder pain, bilateral 06/14/2011   Thoracic back pain 07/31/2017   Vitamin B 12 deficiency 10/16/2020    Her Past Surgical History Is Significant For: Past Surgical History:  Procedure Laterality Date   BREAST REDUCTION SURGERY Bilateral 1986   BUNIONECTOMY Bilateral 1980   CATARACT EXTRACTION, BILATERAL     COLONOSCOPY  2011   HAMMER TOE SURGERY Right 12/03/2013   foot   MOUTH SURGERY   01/10/2013   negative   TONSILLECTOMY     UPPER GASTROINTESTINAL ENDOSCOPY  2016   RK    Her Family History Is Significant For: Family History  Problem Relation Age of Onset   Stroke Mother 70   Hyperlipidemia Mother    Hypertension Mother    Heart attack Father 24   Hypertension Father    Heart disease Father    Hypertension Brother    Aneurysm Brother        aortic   Hyperlipidemia Brother    Heart disease Brother        aortic aneurysm rupture   Multiple sclerosis Daughter    Alcohol abuse Son    Schizophrenia Son    Bipolar disorder Son    Mental illness Son        schizophrenic, bipolar   Colon cancer Neg Hx    Colon polyps Neg Hx    Esophageal cancer Neg Hx    Rectal cancer Neg Hx    Stomach cancer Neg Hx    Alzheimer's disease Neg Hx     Her Social History Is Significant For: Social History   Socioeconomic History   Marital status: Married    Spouse name: Not on file   Number of children: 4   Years of education: 14   Highest education level: Associate degree: academic program  Occupational History   Occupation: Retired    Comment: Arboriculturist  Tobacco Use   Smoking status: Never   Smokeless tobacco: Never  Vaping Use   Vaping Use: Never used  Substance and Sexual Activity   Alcohol use: No   Drug use: No   Sexual activity: Not Currently  Other Topics Concern   Not on file  Social History Narrative   4 children 2 are adopted      Lives at home with husband, children live closeby    Right handed   Caffeine: maybe 1-3 "or more" cups/day   Social Determinants of Health   Financial Resource Strain: Not on file  Food Insecurity: Not on file  Transportation Needs: Not on file  Physical Activity: Not on file  Stress: Not on file  Social Connections: Not on file    Her Allergies Are:  No Known Allergies:   Her Current Medications Are:  Outpatient Encounter Medications as of 03/28/2021  Medication Sig   amLODipine (NORVASC) 10 MG  tablet TAKE 1/2 TABLET BY MOUTH TWICE A DAY   aspirin EC 81 MG tablet Take 1 tablet (81 mg total) by mouth daily.   calcium carbonate (TUMS - DOSED IN MG ELEMENTAL CALCIUM) 500 MG chewable tablet Chew 1 tablet by mouth daily.   famotidine (PEPCID) 40 MG tablet Take 1 tablet (40 mg  total) by mouth at bedtime as needed for heartburn or indigestion.   folic acid (FOLVITE) 1 MG tablet Take 1 tablet (1 mg total) by mouth daily.   furosemide (LASIX) 20 MG tablet TAKE 0.5 TABLETS (10 MG TOTAL) BY MOUTH DAILY AS NEEDED FOR EDEMA.   metoprolol succinate (TOPROL-XL) 50 MG 24 hr tablet TAKE 1 TABLET BY MOUTH TWICE A DAY WITH OR IMMEDIATELY FOLLOWING A MEAL   rosuvastatin (CRESTOR) 20 MG tablet Take 1 tablet (20 mg total) by mouth daily.   trospium (SANCTURA) 20 MG tablet Take 1 tablet (20 mg total) by mouth daily.   UNABLE TO FIND COGNITIVE TABLET DAILY   valsartan (DIOVAN) 320 MG tablet TAKE 1 TABLET BY MOUTH EVERY DAY   Multiple Minerals-Vitamins (CITRACAL PLUS PO) Take 1 tablet by mouth daily. (Patient not taking: Reported on 03/28/2021)   Facility-Administered Encounter Medications as of 03/28/2021  Medication   cyanocobalamin ((VITAMIN B-12)) injection 1,000 mcg  :  Review of Systems:  Out of a complete 14 point review of systems, all are reviewed and negative with the exception of these symptoms as listed below:    Review of Systems  Neurological:        Patient is here with her husband for follow-up after formal memory testing. She is nervous about the discussion. She doesn't know what to expect. Her husband would like to discuss possible medications that may slow down the process. Specifically questions about Leqembi.    Objective:  Neurological Exam  Physical Exam Physical Examination:   There were no vitals filed for this visit.  General Examination: The patient is a very pleasant 76 y.o. female in no acute distress. She appears well-developed and well-nourished and well groomed.    HEENT: Normocephalic, atraumatic, pupils are equal, round and reactive to light, extraocular tracking is well-preserved, hearing is grossly intact, face is symmetric with normal facial animation, speech is clear, neck is supple, no carotid bruits.  Airway examination reveals mild mouth dryness, tongue protrudes centrally and palate elevates symmetrically.     Chest: Clear to auscultation without wheezing, rhonchi or crackles noted.   Heart: S1+S2+0, regular and normal without murmurs, rubs or gallops noted.   Abdomen: Soft, non-tender and non-distended.   Extremities: There is no pitting edema in the distal lower extremities bilaterally.   Skin: Warm and dry without trophic changes noted.   Musculoskeletal: exam reveals prominent arthritic changes in both hands.   Neurologically: Mental status: The patient is awake, alert and pays good attention, she is able to provide her history fairly well with details provided by her daughter and husband.   Speech is clear with normal prosody and enunciation. Thought process is linear. Mood is normal and affect is normal, maybe a little bit more anxious.  Some word finding difficulty noted.   MMSE - Mini Mental State Exam 09/30/2020 10/30/2017 08/07/2016  Orientation to time 4 5 5   Orientation to Place 4 5 5   Registration 3 3 3   Attention/ Calculation 0 5 5  Recall 0 1 3  Language- name 2 objects 2 2 2   Language- repeat 1 1 1   Language- follow 3 step command 3 3 3   Language- read & follow direction 0 1 1  Write a sentence 1 1 1   Copy design 0 1 1  Total score 18 28 30     On 09/30/2020: CDT: 4/4, AFT: 13/min.   Cranial nerves II - XII are as described above under HEENT exam. In addition: shoulder shrug  is normal with equal shoulder height noted. Motor exam: Normal bulk, strength and tone is noted. There is no tremor. Reflexes are 1+ throughout. Fine motor skills and coordination: intact grossly.  Cerebellar testing: No dysmetria or intention  tremor. There is no truncal or gait ataxia. Sensory exam: intact to light touch. Gait, station and balance: She stands easily. No veering to one side is noted. No leaning to one side is noted. Posture is age-appropriate and stance is narrow based. Gait shows normal stride length and normal pace. No problems turning are noted.   Assessment and Plan:    In summary, Erika [name] is a very pleasant 76 year old female with an underlying medical history of allergies, arthritis, reflux disease, hypertension, hyperlipidemia, irritable bowel syndrome, sleep apnea, mildly overweight state, iron deficiency, and vitamin B12 deficiency, who presents for follow-up consultation of her memory loss.  She had neuropsychological evaluation in December 2022 which supported an underlying major neurocognitive disorder.  Findings are not specific for frontotemporal dementia or compelling for Alzheimer's dementia but an overlap syndrome is possible.  She has had particular difficulty with including word finding difficulty, impaired repetition, impaired naming, losing train of thought.  Her history and examination are not in keeping with Lewy body dementia.  Today I suggested we start her on generic Aricept and pursue additional testing in the form of FDG-PET scan of the brain which may be a useful tool in narrowing down the diagnosis.  She has not had any behavioral changes thankfully.  We talked about the importance of maintaining a healthy lifestyle including good nutrition and hydration. I made a referral to speech therapy at neuro rehab.  We can also consider a referral to a comprehensive memory loss clinic, such as to [facility].  They are agreeable to pursuing the PET scan.  We will call with the results.  She is advised to follow-up to see one of our nurse practitioners in 3 months.  We will start the donepezil at 5 mg at bedtime for now with the plan to increase it at the next visit.  I answered all their questions today  and the patient and her husband and daughter were in agreement. I spent 40 minutes in total face-to-face time and in reviewing records during pre-charting, more than 50% of which was spent in counseling and coordination of care, reviewing test results, reviewing medications and treatment regimen and/or in discussing or reviewing the diagnosis of dementia, the prognosis and treatment options. Pertinent laboratory and imaging test results that were available during this visit with the patient were reviewed by me and considered in my medical decision making (see chart for details).

## 2021-03-28 NOTE — Patient Instructions (Addendum)
It was nice to see you both today. As discussed, we will start you on a medication called Aricept (generic name: donepezil) 10 mg: take 1/2 pill each evening. Common side effects may include dry eyes, dry mouth, confusion, low pulse, low blood pressure, also GI related side effects (nausea, vomiting, diarrhea, constipation), headaches; rare side effects may include hallucinations and seizures.   I have made a referral to speech therapy.  We can consider referring you to [facility] memory disorders Center/clinic.  We will pursue a brain PET scan and call you with the results.  It can help narrow down the diagnosis such as Alzheimer's dementia.  Follow-up with to see one of our nurse practitioners in 3 months.

## 2021-03-29 ENCOUNTER — Telehealth: Payer: Self-pay | Admitting: Neurology

## 2021-03-29 NOTE — Telephone Encounter (Signed)
Health team no auth req order sent to [facility], they will reach out to the patient to schedule.

## 2021-03-31 ENCOUNTER — Other Ambulatory Visit: Payer: Self-pay

## 2021-03-31 ENCOUNTER — Encounter: Payer: Self-pay | Admitting: Neurology

## 2021-03-31 ENCOUNTER — Ambulatory Visit: Payer: PPO | Attending: Neurology

## 2021-03-31 DIAGNOSIS — R4701 Aphasia: Secondary | ICD-10-CM | POA: Insufficient documentation

## 2021-03-31 DIAGNOSIS — R41841 Cognitive communication deficit: Secondary | ICD-10-CM | POA: Insufficient documentation
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
## 2021-04-04 ENCOUNTER — Ambulatory Visit: Payer: PPO

## 2021-04-04 ENCOUNTER — Other Ambulatory Visit: Payer: Self-pay

## 2021-04-04 DIAGNOSIS — R41841 Cognitive communication deficit: Secondary | ICD-10-CM

## 2021-04-04 DIAGNOSIS — R4701 Aphasia: Secondary | ICD-10-CM

## 2021-04-04 NOTE — Therapy (Signed)
[facility] [address], [place], [place], [address] Phone: [phone]   Fax:  [phone]  Speech Language Pathology Treatment  Patient Details  Name: Erika [name] MRN: [id number] Date of Birth: 1946-01-19 Referring Provider (SLP): [name], MD   Encounter Date: 04/04/2021   End of Session - 04/04/21 1218     Visit Number 2    Number of Visits 17    Date for SLP Re-Evaluation 05/27/21    Authorization Type HTA    SLP Start Time 96    SLP Stop Time  1103    SLP Time Calculation (min) 44 min    Activity Tolerance Patient tolerated treatment well             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   Cerebrovascular disease    Chest pain 01/19/2014   Chronic rhinitis 01/13/2009   Diverticulosis of colon    colonoscopy 04/14/1999   Dry mouth 03/13/2012   Fatigue 08/02/2014   GERD (gastroesophageal reflux disease)    Headache 02/11/2018   History of chicken pox    as a child   History of fibrocystic disease of breast    History of measles    as a child   History of mumps    as a child   Hyperlipidemia, mixed 02/20/2007   Goal LDL < 130 HBP/Pos fm hx   Hypertension    Hypokalemia 06/24/2012   Hyponatremia 08/02/2014   IBS (irritable bowel syndrome) 04/02/2011   IDA (iron deficiency anemia) 12/28/2020   Low vitamin D level 01/09/2016   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   Obstructive sleep apnea 05/23/2017   uses CPAP nightly   Osteopenia after menopause    Overactive bladder 04/02/2011   Pedal edema 05/20/2020   Shoulder pain, bilateral 06/14/2011   Thoracic back pain 07/31/2017   Vitamin B 12 deficiency 10/16/2020    Past Surgical History:   Procedure Laterality Date   BREAST REDUCTION SURGERY Bilateral 1986   BUNIONECTOMY Bilateral 1980   CATARACT EXTRACTION, BILATERAL     COLONOSCOPY  2011   HAMMER TOE SURGERY Right 12/03/2013   foot   MOUTH SURGERY  01/10/2013   negative   TONSILLECTOMY     UPPER GASTROINTESTINAL ENDOSCOPY  2016   RK    There were no vitals filed for this visit.   Subjective Assessment - 04/04/21 1213     Subjective Pt endorsed having difficulty with memory (husband Erika [name] agreed), and also anomia.    Patient is accompained by: Family member   husband Erika [name]   Currently in Pain? No/denies                   ADULT SLP TREATMENT - 04/04/21 1057       General Information   Behavior/Cognition Alert;Cooperative;Pleasant mood;Requires cueing      Treatment Provided   Treatment provided Cognitive-Linquistic      Cognitive-Linquistic Treatment   Treatment focused on Cognition;Aphasia    Skilled Treatment Pt's anomia evident today in conversation with pt and husband Erika [name] surrounding memory strategies. Erika [name] thought that a memory book would be helpful for pt to keep all her reminder notes in one location. Session spent introducing memory strategies and SLP providing examples  of strategies. SLP also explained some options for med management - pt is independent currently with administration but not filling med box. SLP thought pt could do so with supervision - more discussion on this later, and also details of memory book which pt will begin before next session. Pt apperaed reticent to schedule x2/week based on their schedule of family responsibilities and other appointments. SLP encouraged pt/husband to prioritize these and ST will do whatever pt desires but SLP would recommend x2/week x4 weeks and then x1/week x 4 weeks.      Assessment / Recommendations / Plan   Plan Continue with current plan of care      Progression Toward Goals   Progression toward goals Progressing toward goals               SLP Education - 04/04/21 1218     Education Details memory strategies, medication box filling and administration, goal is to make pt as indepenent as possible and still be safe    Person(s) Educated Patient;Spouse    Methods Explanation;Handout    Comprehension Verbalized understanding;Need further instruction              SLP Short Term Goals - 04/04/21 1221       SLP SHORT TERM GOAL #1   Title Pt will implement external memory/attention compensations to aid daily functioning and improve safety given usual min A over 2 sessions    Time 4    Period Weeks    Status On-going    Target Date 04/29/21      SLP SHORT TERM GOAL #2   Title Pt will utilize anomia compensations to aid word finding on functional structured speech tasks given occasional min A over 2 sessions    Time 4    Period Weeks    Status On-going    Target Date 04/29/21      SLP SHORT TERM GOAL #3   Title Pt will utilize anomia compensations and/or augmentative aids in simple 5-10 minute conversation given occasional min to mod A over 2 sessions    Time 4    Period Weeks    Status On-going    Target Date 04/29/21              SLP Long Term Goals - 04/04/21 1221       SLP LONG TERM GOAL #1   Title Caregivers will assist with implementation and carryover of memory/attention compensations to aid daily functioning and safety at home given rare min A    Time 8    Period Weeks    Status On-going    Target Date 05/27/21      SLP LONG TERM GOAL #2   Title Pt will utilize anomia compensations or augmentative external supports in 15 minute conversation to aid verbal expression given occasional min A    Time 8    Period Weeks    Status On-going    Target Date 05/27/21      SLP LONG TERM GOAL #3   Title Pt will demonstrate ability to utilize personally relevant augmentative communication aid/system to aid verbal expression as needed in the future given occasional mod A    Time 8    Period  Weeks    Status On-going    Target Date 05/27/21              Plan - 04/04/21 1219     Clinical Impression Statement Erika [name] was referred by neurology due to worsening word  finding and memory associated with dementia (neuropsychologist questioned frontotemporal dementia versus Alzheimers). Decline in recall documented ~1 years ago but language decline has occured more recently. Today, pt was accompanied by husband, Erika [name]. Husband and pt began using orientation clock to use at home as orientation is inconsistent but improved with use of visual cue of clock.SLP cont to recommend skilled ST intervention to educate and train functional compensations to aid memory/attention and safety awareness, optimize maintenance of current language skills, as well as support future language skills given expected disease progression.    Speech Therapy Frequency 2x / week   husband requested 1x/week   Duration 8 weeks    Treatment/Interventions Compensatory strategies;Functional tasks;Patient/family education;Cueing hierarchy;Multimodal communcation approach;Language facilitation;Compensatory techniques;Internal/external aids;SLP instruction and feedback    Potential to Achieve Goals Fair    Potential Considerations Medical prognosis             Patient will benefit from skilled therapeutic intervention in order to improve the following deficits and impairments:   Cognitive communication deficit  Aphasia    Problem List Patient Active Problem List   Diagnosis Date Noted   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   Cerebrovascular disease    IDA (iron deficiency anemia) 12/28/2020   Vitamin B 12 deficiency 10/16/2020   Osteopenia after menopause    GERD (gastroesophageal reflux disease)    History of chicken pox    Sun-damaged skin 05/23/2020   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   Headache 02/11/2018   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, [place], [place], [address] Phone: [phone]   Fax:  [phone]   Name: RACQUELLE HYSER MRN: [id number] Date of Birth: Oct 30, 1945

## 2021-04-04 NOTE — Patient Instructions (Signed)
° °  Memory Compensation Strategies  For general memory compensation, use WARM strategies W= writing it down A=  associating it with something else R=  repeating it M=  making a mental picture  You can keep a Memory Notebook. Use a 3-ring notebook with sections for the following:  calendar, important names and phone numbers, medications, doctors names/phone numbers, to do list/reminders, and a section to journal what you did each day  Use a calendar to write appointments down.  Write yourself a schedule for the day.  This can be placed on the calendar or in a separate section of the Memory Notebook.  Keeping a regular schedule can help memory.  Use medication organizer with sections for each day or morning/evening pills  You may need help loading it  Keep a basket, or pegboard by the door.   Place items that you need to take out with you in the basket or on the pegboard.  You may also want to include a message board for reminders.  Use sticky notes. Place sticky notes with reminders in a place where the task is performed.  For example:  turn off the stove placed by the stove, lock the door placed on the door at eye level, take your medications on the bathroom mirror or by the place where you normally take your medications  Use alarms, timers, and/or a reminder app. Use while cooking to remind yourself to check on food or as a reminder to take your medicine, or as a reminder to make a call, or as a reminder to perform another task, etc.  Use a voice recorder app or small tape recorder to record important information and notes for yourself. Go back at the end of the day and listen to these.

## 2021-04-11 ENCOUNTER — Other Ambulatory Visit: Payer: Self-pay

## 2021-04-11 ENCOUNTER — Ambulatory Visit: Payer: PPO | Attending: Neurology

## 2021-04-11 DIAGNOSIS — R4701 Aphasia: Secondary | ICD-10-CM | POA: Diagnosis not present

## 2021-04-11 DIAGNOSIS — R41841 Cognitive communication deficit: Secondary | ICD-10-CM | POA: Diagnosis not present

## 2021-04-11 NOTE — Therapy (Signed)
[facility] [address] [place], [place], [address] Phone: [phone]   Fax:  [phone]  Speech Language Pathology Treatment  Patient Details  Name: Erika [name] MRN: [id number] Date of Birth: 1945/11/18 Referring Provider (SLP): [name], MD   Encounter Date: 04/11/2021   End of Session - 04/11/21 0930     Visit Number 3    Number of Visits 17    Date for SLP Re-Evaluation 05/27/21    Authorization Type HTA    SLP Start Time 0805    SLP Stop Time  3762    SLP Time Calculation (min) 44 min    Activity Tolerance Patient tolerated treatment well             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   Cerebrovascular disease    Chest pain 01/19/2014   Chronic rhinitis 01/13/2009   Diverticulosis of colon    colonoscopy 04/14/1999   Dry mouth 03/13/2012   Fatigue 08/02/2014   GERD (gastroesophageal reflux disease)    Headache 02/11/2018   History of chicken pox    as a child   History of fibrocystic disease of breast    History of measles    as a child   History of mumps    as a child   Hyperlipidemia, mixed 02/20/2007   Goal LDL < 130 HBP/Pos fm hx   Hypertension    Hypokalemia 06/24/2012   Hyponatremia 08/02/2014   IBS (irritable bowel syndrome) 04/02/2011   IDA (iron deficiency anemia) 12/28/2020   Low vitamin D level 01/09/2016   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   Obstructive sleep apnea 05/23/2017   uses CPAP nightly   Osteopenia after menopause    Overactive bladder 04/02/2011   Pedal edema 05/20/2020   Shoulder pain, bilateral 06/14/2011   Thoracic back pain 07/31/2017   Vitamin B 12 deficiency 10/16/2020    Past Surgical History:   Procedure Laterality Date   BREAST REDUCTION SURGERY Bilateral 1986   BUNIONECTOMY Bilateral 1980   CATARACT EXTRACTION, BILATERAL     COLONOSCOPY  2011   HAMMER TOE SURGERY Right 12/03/2013   foot   MOUTH SURGERY  01/10/2013   negative   TONSILLECTOMY     UPPER GASTROINTESTINAL ENDOSCOPY  2016   RK    There were no vitals filed for this visit.   Subjective Assessment - 04/11/21 0807     Subjective "Seems like a lot - going on - like in the Bible." (pt, re: recent news)    Patient is accompained by: Family member   Erika [name]   Currently in Pain? No/denies                   ADULT SLP TREATMENT - 04/11/21 0807       General Information   Behavior/Cognition Alert;Cooperative;Pleasant mood;Requires cueing      Treatment Provided   Treatment provided Cognitive-Linquistic      Cognitive-Linquistic Treatment   Treatment focused on Aphasia;Cognition    Skilled Treatment Pt does not voice interest in memory/cognitive therapy so SLP focused on aphasia - anomia compensations. Orpah Greek stated upon direct questioning by SLP that has more anomia than dysnomia. Pt and husband state she uses description and synonym strategies. SLP  then introduced [employer] (SFA) and went through an example with them to demonstrate how to use. Told pt/husband to use this technique with any noun pt has difficulty generating. Suggested pt write and say sentence responses to each of the six segments of SFA. Today pt with dysnomia of "same" instead of "right" and she self corrected. Pt and husband questioning need for going to [facility]. SLP read them note from their daughter to Dr. Rexene Alberts and directed questions to Dr. Rexene Alberts and Hoyle Sauer (pt's daughter).      Assessment / Recommendations / Plan   Plan Continue with current plan of care      Progression Toward Goals   Progression toward goals Progressing toward goals              SLP Education - 04/11/21 0929      Education Details semantic feature analysis and demonstration and rationale    Person(s) Educated Patient;Spouse    Methods Explanation;Demonstration;Handout    Comprehension Verbalized understanding;Need further instruction              SLP Short Term Goals - 04/11/21 0931       SLP SHORT TERM GOAL #1   Title Pt will implement external memory/attention compensations to aid daily functioning and improve safety given usual min A over 2 sessions    Time 4    Period Weeks    Status On-going    Target Date 04/29/21      SLP SHORT TERM GOAL #2   Title Pt will utilize anomia compensations to aid word finding on functional structured speech tasks given occasional min A over 2 sessions    Time 4    Period Weeks    Status On-going    Target Date 04/29/21      SLP SHORT TERM GOAL #3   Title Pt will utilize anomia compensations and/or augmentative aids in simple 5-10 minute conversation given occasional min to mod A over 2 sessions    Time 4    Period Weeks    Status On-going    Target Date 04/29/21              SLP Long Term Goals - 04/11/21 0932       SLP LONG TERM GOAL #1   Title Caregivers will assist with implementation and carryover of memory/attention compensations to aid daily functioning and safety at home given rare min A    Time 8    Period Weeks    Status On-going    Target Date 05/27/21      SLP LONG TERM GOAL #2   Title Pt will utilize anomia compensations or augmentative external supports in 15 minute conversation to aid verbal expression given occasional min A    Time 8    Period Weeks    Status On-going    Target Date 05/27/21      SLP LONG TERM GOAL #3   Title Pt will demonstrate ability to utilize personally relevant augmentative communication aid/system to aid verbal expression as needed in the future given occasional mod A    Time 8    Period Weeks    Status On-going    Target Date 05/27/21              Plan - 04/11/21 0930      Clinical Impression Statement Christne was referred by neurology due to worsening word finding and memory associated with dementia (neuropsychologist questioned frontotemporal dementia versus Alzheimers). Decline in  recall documented ~1 years ago but language decline has occured more recently. Today, pt was accompanied by husband, Orpah Greek. Husband and pt were educated today with SFA and encouraged to use this technique at home to preserve/strengthen remaining language function. Pt and husband appear less-interested in cognitive/memory treatment. SLP cont to recommend skilled ST intervention to educate and train functional compensations to aid memory/attention and safety awareness, optimize maintenance of current language skills, as well as support future language skills given expected disease progression.    Speech Therapy Frequency 2x / week   husband requested 1x/week   Duration 8 weeks    Treatment/Interventions Compensatory strategies;Functional tasks;Patient/family education;Cueing hierarchy;Multimodal communcation approach;Language facilitation;Compensatory techniques;Internal/external aids;SLP instruction and feedback    Potential to Achieve Goals Fair    Potential Considerations Medical prognosis             Patient will benefit from skilled therapeutic intervention in order to improve the following deficits and impairments:   Aphasia  Cognitive communication deficit    Problem List Patient Active Problem List   Diagnosis Date Noted   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   Cerebrovascular disease    IDA (iron deficiency anemia) 12/28/2020   Vitamin B 12 deficiency 10/16/2020   Osteopenia after menopause    GERD (gastroesophageal reflux disease)    History of chicken pox    Sun-damaged skin 05/23/2020   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   Headache 02/11/2018   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, [place], [place], [address] Phone: [phone]   Fax:  [phone]   Name: ZADIA UHDE MRN: [id number] Date of Birth: 1946/02/14

## 2021-04-11 NOTE — Patient Instructions (Signed)

## 2021-04-12 ENCOUNTER — Encounter (HOSPITAL_COMMUNITY)
Admission: RE | Admit: 2021-04-12 | Discharge: 2021-04-12 | Disposition: A | Discharge status: Home / Self Care | Payer: PPO | Source: Ambulatory Visit | Attending: Neurology | Admitting: Neurology

## 2021-04-12 DIAGNOSIS — F0390 Unspecified dementia without behavioral disturbance: Secondary | ICD-10-CM | POA: Insufficient documentation

## 2021-04-12 LAB — GLUCOSE, CAPILLARY: Glucose-Capillary: 94 mg/dL (ref 70–99)

## 2021-04-12 MED ORDER — FLUDEOXYGLUCOSE F - 18 (FDG) INJECTION
9.9900 | Freq: Once | INTRAVENOUS | Status: AC | PRN
  Administered 2021-04-12: 9.99 via INTRAVENOUS

## 2021-04-13 NOTE — Addendum Note (Signed)
Addended by: [name] on: 04/13/2021 12:31 PM   Modules accepted: Orders

## 2021-04-14 ENCOUNTER — Telehealth: Payer: Self-pay | Admitting: Neurology

## 2021-04-14 ENCOUNTER — Ambulatory Visit: Payer: PPO

## 2021-04-14 ENCOUNTER — Other Ambulatory Visit: Payer: Self-pay

## 2021-04-14 DIAGNOSIS — R4701 Aphasia: Secondary | ICD-10-CM

## 2021-04-14 DIAGNOSIS — R41841 Cognitive communication deficit: Secondary | ICD-10-CM

## 2021-04-14 NOTE — Telephone Encounter (Signed)
Sent to [facility] ph # [phone]

## 2021-04-14 NOTE — Therapy (Signed)
[facility] [address] [place], [place], [address] Phone: [phone]   Fax:  [phone]  Speech Language Pathology Treatment  Patient Details  Name: Erika [name] MRN: [id number] Date of Birth: 10-26-1945 Referring Provider (SLP): [name], MD   Encounter Date: 04/14/2021   End of Session - 04/14/21 1015     Visit Number 4    Number of Visits 17    Date for SLP Re-Evaluation 05/27/21    SLP Start Time 0850    SLP Stop Time  0931    SLP Time Calculation (min) 41 min    Activity Tolerance Patient tolerated treatment well             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   Cerebrovascular disease    Chest pain 01/19/2014   Chronic rhinitis 01/13/2009   Diverticulosis of colon    colonoscopy 04/14/1999   Dry mouth 03/13/2012   Fatigue 08/02/2014   GERD (gastroesophageal reflux disease)    Headache 02/11/2018   History of chicken pox    as a child   History of fibrocystic disease of breast    History of measles    as a child   History of mumps    as a child   Hyperlipidemia, mixed 02/20/2007   Goal LDL < 130 HBP/Pos fm hx   Hypertension    Hypokalemia 06/24/2012   Hyponatremia 08/02/2014   IBS (irritable bowel syndrome) 04/02/2011   IDA (iron deficiency anemia) 12/28/2020   Low vitamin D level 01/09/2016   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   Obstructive sleep apnea 05/23/2017   uses CPAP nightly   Osteopenia after menopause    Overactive bladder 04/02/2011   Pedal edema 05/20/2020   Shoulder pain, bilateral 06/14/2011   Thoracic back pain 07/31/2017   Vitamin B 12 deficiency 10/16/2020    Past Surgical History:  Procedure Laterality Date    BREAST REDUCTION SURGERY Bilateral 1986   BUNIONECTOMY Bilateral 1980   CATARACT EXTRACTION, BILATERAL     COLONOSCOPY  2011   HAMMER TOE SURGERY Right 12/03/2013   foot   MOUTH SURGERY  01/10/2013   negative   TONSILLECTOMY     UPPER GASTROINTESTINAL ENDOSCOPY  2016   RK    There were no vitals filed for this visit.   Subjective Assessment - 04/14/21 0852     Subjective "I did a little of it at home."    Patient is accompained by: Family member   Erika [name] - husband   Currently in Pain? No/denies                   ADULT SLP TREATMENT - 04/14/21 0853       General Information   Behavior/Cognition Alert;Cooperative;Pleasant mood;Requires cueing      Treatment Provided   Treatment provided Cognitive-Linquistic      Cognitive-Linquistic Treatment   Treatment focused on Aphasia;Cognition    Skilled Treatment Pt showed me her semantic feature analyses (SFAs) she had performed at home and there were errors with them as well as no complete sentences as SLP had instructed. SLP asked Erika [name] if he was comfortable assisting Courtnei at home with SFA and he said he was comfortable with this. Thus, SLP  again demonstrated how to assist pt with the SFA process introduced last session, stopping occasionally to allow for questions or comments from Sharon. When SLP stopped there were not questions/comments. At end of session, Erika [name] again affirmed he would be comfortable assisting pt at home with SFA. SLP asked pt and husband if they wanted to schedule more ST and pt was hesitant to schedule any more appointments. SLP stated it might be good to have 1-2 more weeks scheduled in case they are needed. Pt/husband to talk about this.      Assessment / Recommendations / Plan   Plan Continue with current plan of care      Progression Toward Goals   Progression toward goals Progressing toward goals              SLP Education - 04/14/21 1014     Education Details how to assist Anetta at  home with semantic feature analysis    Person(s) Educated Patient;Spouse    Methods Explanation;Demonstration    Comprehension Verbalized understanding;Need further instruction              SLP Short Term Goals - 04/14/21 1150       SLP SHORT TERM GOAL #1   Title Pt will implement external memory/attention compensations to aid daily functioning and improve safety given usual min A over 2 sessions    Time 4    Period Weeks    Status On-going    Target Date 04/29/21      SLP SHORT TERM GOAL #2   Title Pt will utilize anomia compensations to aid word finding on functional structured speech tasks given occasional min A over 2 sessions    Time 4    Period Weeks    Status On-going    Target Date 04/29/21      SLP SHORT TERM GOAL #3   Title Pt will utilize anomia compensations and/or augmentative aids in simple 5-10 minute conversation given occasional min to mod A over 2 sessions    Time 4    Period Weeks    Status On-going    Target Date 04/29/21              SLP Long Term Goals - 04/14/21 1150       SLP LONG TERM GOAL #1   Title Caregivers will assist with implementation and carryover of memory/attention compensations to aid daily functioning and safety at home given rare min A    Time 8    Period Weeks    Status On-going    Target Date 05/27/21      SLP LONG TERM GOAL #2   Title Pt will utilize anomia compensations or augmentative external supports in 15 minute conversation to aid verbal expression given occasional min A    Time 8    Period Weeks    Status On-going    Target Date 05/27/21      SLP LONG TERM GOAL #3   Title Pt will demonstrate ability to utilize personally relevant augmentative communication aid/system to aid verbal expression as needed in the future given occasional mod A    Time 8    Period Weeks    Status On-going    Target Date 05/27/21              Plan - 04/14/21 1015     Clinical Impression Statement [name] was referred by  neurology due to worsening word finding and memory associated with dementia (neuropsychologist questioned frontotemporal dementia versus  Alzheimers). Decline in recall documented ~1 years ago but language decline has occured more recently. Today, pt was again accompanied by husband, Erika [name]. Despite pt's hesitancy with continuing ST after 04-28-21, SLP recommneded scheduling further to cont to treat pt's aphasia and cognitive linguistic deficits. SLP cont to recommend skilled ST intervention to educate and train functional compensations to aid memory/attention and safety awareness, optimize maintenance of current language skills, as well as support future language skills given expected disease progression.    Speech Therapy Frequency 2x / week   husband requested 1x/week   Duration 8 weeks    Treatment/Interventions Compensatory strategies;Functional tasks;Patient/family education;Cueing hierarchy;Multimodal communcation approach;Language facilitation;Compensatory techniques;Internal/external aids;SLP instruction and feedback    Potential to Achieve Goals Fair    Potential Considerations Medical prognosis             Patient will benefit from skilled therapeutic intervention in order to improve the following deficits and impairments:   Aphasia  Cognitive communication deficit    Problem List Patient Active Problem List   Diagnosis Date Noted   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   Cerebrovascular disease    IDA (iron deficiency anemia) 12/28/2020   Vitamin B 12 deficiency 10/16/2020   Osteopenia after menopause    GERD (gastroesophageal reflux disease)    History of chicken pox    Sun-damaged skin 05/23/2020   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   Headache 02/11/2018   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, [place], [place], [address] Phone: [phone]   Fax:  [phone]   Name: LORIBETH KATICH MRN: [id number] Date of Birth: 12/05/1945

## 2021-04-18 ENCOUNTER — Other Ambulatory Visit: Payer: Self-pay

## 2021-04-18 ENCOUNTER — Ambulatory Visit: Payer: PPO

## 2021-04-18 DIAGNOSIS — R4701 Aphasia: Secondary | ICD-10-CM | POA: Diagnosis not present

## 2021-04-18 DIAGNOSIS — R41841 Cognitive communication deficit: Secondary | ICD-10-CM

## 2021-04-18 NOTE — Therapy (Signed)
[facility] [address] [place], [place], [address] Phone: [phone]   Fax:  [phone]  Speech Language Pathology Treatment  Patient Details  Name: Erika [name] MRN: [id number] Date of Birth: Aug 19, 1945 Referring Provider (SLP): [name], MD   Encounter Date: 04/18/2021   End of Session - 04/18/21 0931     Visit Number 5    Number of Visits 17    Date for SLP Re-Evaluation 05/27/21    Authorization Type HTA    SLP Start Time 0804    SLP Stop Time  2119    SLP Time Calculation (min) 40 min    Activity Tolerance Patient tolerated treatment well             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   Cerebrovascular disease    Chest pain 01/19/2014   Chronic rhinitis 01/13/2009   Diverticulosis of colon    colonoscopy 04/14/1999   Dry mouth 03/13/2012   Fatigue 08/02/2014   GERD (gastroesophageal reflux disease)    Headache 02/11/2018   History of chicken pox    as a child   History of fibrocystic disease of breast    History of measles    as a child   History of mumps    as a child   Hyperlipidemia, mixed 02/20/2007   Goal LDL < 130 HBP/Pos fm hx   Hypertension    Hypokalemia 06/24/2012   Hyponatremia 08/02/2014   IBS (irritable bowel syndrome) 04/02/2011   IDA (iron deficiency anemia) 12/28/2020   Low vitamin D level 01/09/2016   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   Obstructive sleep apnea 05/23/2017   uses CPAP nightly   Osteopenia after menopause    Overactive bladder 04/02/2011   Pedal edema 05/20/2020   Shoulder pain, bilateral 06/14/2011   Thoracic back pain 07/31/2017   Vitamin B 12 deficiency 10/16/2020    Past Surgical History:   Procedure Laterality Date   BREAST REDUCTION SURGERY Bilateral 1986   BUNIONECTOMY Bilateral 1980   CATARACT EXTRACTION, BILATERAL     COLONOSCOPY  2011   HAMMER TOE SURGERY Right 12/03/2013   foot   MOUTH SURGERY  01/10/2013   negative   TONSILLECTOMY     UPPER GASTROINTESTINAL ENDOSCOPY  2016   RK    There were no vitals filed for this visit.   Subjective Assessment - 04/18/21 0920     Subjective Pt arrrived with two semantic feature analysis completed with husband's assistance.    Patient is accompained by: Family member   [name]-husband   Currently in Pain? No/denies                   ADULT SLP TREATMENT - 04/18/21 0921       General Information   Behavior/Cognition Alert;Cooperative;Pleasant mood;Requires cueing      Treatment Provided   Treatment provided Cognitive-Linquistic      Cognitive-Linquistic Treatment   Treatment focused on Cognition;Aphasia    Skilled Treatment Pt with some simple initial conversation (pleasntries) with good success today re: [employer] - WNL language. SLP reviewed her SFAs with her - and stated she could do a more specific one ("volleyball") with husband after a more general one ("ball"). SLp introduced Verb  Network task (VNEST) and demonstrated with husband and Jaicey. Orpah Greek indicated he was comfortable with completing with pt at home. She req'd min A consistently.      Assessment / Recommendations / Plan   Plan Continue with current plan of care   added goal for assistance with SFA and VNeST     Progression Toward Goals   Progression toward goals Progressing toward goals              SLP Education - 04/18/21 0930     Education Details VNeST procedure    Person(s) Educated Patient;Spouse    Methods Explanation;Demonstration;Verbal cues;Handout    Comprehension Verbalized understanding;Verbal cues required              SLP Short Term Goals - 04/18/21 0932       SLP SHORT TERM GOAL #1   Title Pt will  implement external memory/attention compensations to aid daily functioning and improve safety given usual min A over 2 sessions    Time 4    Period Weeks    Status On-going    Target Date 04/29/21      SLP SHORT TERM GOAL #2   Title Pt will utilize anomia compensations to aid word finding on functional structured speech tasks given occasional min A over 2 sessions    Time 4    Period Weeks    Status On-going    Target Date 04/29/21      SLP SHORT TERM GOAL #3   Title Pt will utilize anomia compensations and/or augmentative aids in simple 5-10 minute conversation given occasional min to mod A over 2 sessions    Time 4    Period Weeks    Status On-going    Target Date 04/29/21      SLP SHORT TERM GOAL #4   Title pt's husband will demonsrate effective cueing for pt to complete VNeST or SFA at home    Time 2    Period Weeks    Status New    Target Date 04/29/21              SLP Long Term Goals - 04/18/21 0934       SLP LONG TERM GOAL #1   Title Caregivers will assist with implementation and carryover of memory/attention compensations to aid daily functioning and safety at home given rare min A    Time 8    Period Weeks    Status On-going    Target Date 05/27/21      SLP LONG TERM GOAL #2   Title Pt will utilize anomia compensations or augmentative external supports in 15 minute conversation to aid verbal expression given occasional min A    Time 8    Period Weeks    Status On-going    Target Date 05/27/21      SLP LONG TERM GOAL #3   Title Pt will demonstrate ability to utilize personally relevant augmentative communication aid/system to aid verbal expression as needed in the future given occasional mod A    Time 8    Period Weeks    Status On-going    Target Date 05/27/21      SLP LONG TERM GOAL #4   Title Pt husband will demonstrate capability for assisting pt with VNeST and SFA at home, in 2 sessions    Time 6    Period Weeks    Status New    Target Date  05/27/21  Plan - 04/18/21 0931     Clinical Impression Statement Jolana was referred by neurology due to worsening word finding and memory associated with dementia (neuropsychologist questioned frontotemporal dementia versus Alzheimers). Decline in recall documented ~1 years ago but language decline has occured more recently. Today, pt was again accompanied by husband, Orpah Greek and VNeST was demonstrated with [place] and Orpah Greek. Pt is scheduling fthrough March to cont to treat pt's aphasia and cognitive linguistic deficits. SLP cont to recommend skilled ST intervention to educate and train functional compensations to aid memory/attention and safety awareness, optimize maintenance of current language skills, as well as support future language skills given expected disease progression.    Speech Therapy Frequency 2x / week   husband requested 1x/week   Duration 8 weeks    Treatment/Interventions Compensatory strategies;Functional tasks;Patient/family education;Cueing hierarchy;Multimodal communcation approach;Language facilitation;Compensatory techniques;Internal/external aids;SLP instruction and feedback    Potential to Achieve Goals Fair    Potential Considerations Medical prognosis             Patient will benefit from skilled therapeutic intervention in order to improve the following deficits and impairments:   Cognitive communication deficit  Aphasia    Problem List Patient Active Problem List   Diagnosis Date Noted   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   Cerebrovascular disease    IDA (iron deficiency anemia) 12/28/2020   Vitamin B 12 deficiency 10/16/2020   Osteopenia after menopause    GERD (gastroesophageal reflux disease)    History of chicken pox    Sun-damaged skin 05/23/2020   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   Headache 02/11/2018   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, [place], [place], [address] Phone: [phone]   Fax:  [phone]   Name: MODINE OPPENHEIMER MRN: [id number] Date of Birth: 1945/05/08

## 2021-04-18 NOTE — Patient Instructions (Signed)
VNeST handout

## 2021-04-20 ENCOUNTER — Other Ambulatory Visit: Payer: Self-pay

## 2021-04-20 ENCOUNTER — Ambulatory Visit: Payer: PPO

## 2021-04-20 DIAGNOSIS — R4701 Aphasia: Secondary | ICD-10-CM

## 2021-04-20 DIAGNOSIS — R41841 Cognitive communication deficit: Secondary | ICD-10-CM

## 2021-04-20 NOTE — Therapy (Signed)
[facility] [address] [place], [place], [address] Phone: [phone]   Fax:  [phone]  Speech Language Pathology Treatment  Patient Details  Name: MALORI MYERS MRN: [id number] Date of Birth: Aug 26, 1945 Referring Provider (SLP): [name], MD   Encounter Date: 04/20/2021   End of Session - 04/20/21 1714     Visit Number 6    Number of Visits 17    Date for SLP Re-Evaluation 05/27/21    Authorization Type HTA    SLP Start Time 1450    SLP Stop Time  1660    SLP Time Calculation (min) 40 min    Activity Tolerance Patient tolerated treatment well             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   Cerebrovascular disease    Chest pain 01/19/2014   Chronic rhinitis 01/13/2009   Diverticulosis of colon    colonoscopy 04/14/1999   Dry mouth 03/13/2012   Fatigue 08/02/2014   GERD (gastroesophageal reflux disease)    Headache 02/11/2018   History of chicken pox    as a child   History of fibrocystic disease of breast    History of measles    as a child   History of mumps    as a child   Hyperlipidemia, mixed 02/20/2007   Goal LDL < 130 HBP/Pos fm hx   Hypertension    Hypokalemia 06/24/2012   Hyponatremia 08/02/2014   IBS (irritable bowel syndrome) 04/02/2011   IDA (iron deficiency anemia) 12/28/2020   Low vitamin D level 01/09/2016   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   Obstructive sleep apnea 05/23/2017   uses CPAP nightly   Osteopenia after menopause    Overactive bladder 04/02/2011   Pedal edema 05/20/2020   Shoulder pain, bilateral 06/14/2011   Thoracic back pain 07/31/2017   Vitamin B 12 deficiency 10/16/2020    Past Surgical History:   Procedure Laterality Date   BREAST REDUCTION SURGERY Bilateral 1986   BUNIONECTOMY Bilateral 1980   CATARACT EXTRACTION, BILATERAL     COLONOSCOPY  2011   HAMMER TOE SURGERY Right 12/03/2013   foot   MOUTH SURGERY  01/10/2013   negative   TONSILLECTOMY     UPPER GASTROINTESTINAL ENDOSCOPY  2016   [facility]    There were no vitals filed for this visit.          ADULT SLP TREATMENT - 04/20/21 1709       General Information   Behavior/Cognition Alert;Cooperative;Pleasant mood;Requires cueing      Treatment Provided   Treatment provided Cognitive-Linquistic      Cognitive-Linquistic Treatment   Treatment focused on Cognition;Aphasia    Skilled Treatment First, SLP reviewed a Semantic Feature Analysis task with pt which she accomplished on her own but did not write sentences. SLP assisted with this - pt req'd occasional min A with forming sentences, and occasional min-mod A for procedure with including writing with SFA task. SLP reviewed a VNeST she did with her daughter Hoyle Sauer. Pt completed correctly until the questions so SLP reiterated correct procedure for pt and husband. SLP encouraged pt to complete this task with daughter and/or husband. Husband confirmed he could accomplish with pt if necessary. SLP had to cue  [name] once for proper cueing technique but second opportunity revealed [name] proprerly cueing pt. Pt finds ST "somewhat helpful" thus far. SLP told pt and husband we could cont to work for 4-6 weeks total and then assess whether it is helpful enough to cont with more sessions. No definitive dx makes prognosis difficult.      Assessment / Recommendations / Plan   Plan Continue with current plan of care      Progression Toward Goals   Progression toward goals Progressing toward goals              SLP Education - 04/20/21 1714     Education Details writing sentences with SFA    Person(s) Educated Patient;Spouse    Methods Explanation;Demonstration;Verbal cues     Comprehension Verbalized understanding;Returned demonstration;Need further instruction;Verbal cues required              SLP Short Term Goals - 04/20/21 1715       SLP SHORT TERM GOAL #1   Title Pt will implement external memory/attention compensations to aid daily functioning and improve safety given usual min A over 2 sessions    Time 4    Period Weeks    Status On-going    Target Date 04/29/21      SLP SHORT TERM GOAL #2   Title Pt will utilize anomia compensations to aid word finding on functional structured speech tasks given occasional min A over 2 sessions    Baseline 04-20-21    Time 4    Period Weeks    Status On-going    Target Date 04/29/21      SLP SHORT TERM GOAL #3   Title Pt will utilize anomia compensations and/or augmentative aids in simple 5-10 minute conversation given occasional min to mod A over 2 sessions    Baseline 04-20-21    Time 4    Period Weeks    Status On-going    Target Date 04/29/21      SLP SHORT TERM GOAL #4   Title pt's husband will demonsrate effective cueing for pt to complete VNeST or SFA at home    Time 2    Period Weeks    Status Achieved    Target Date 04/29/21              SLP Long Term Goals - 04/20/21 1716       SLP LONG TERM GOAL #1   Title Caregivers will assist with implementation and carryover of memory/attention compensations to aid daily functioning and safety at home given rare min A    Time 8    Period Weeks    Status On-going    Target Date 05/27/21      SLP LONG TERM GOAL #2   Title Pt will utilize anomia compensations or augmentative external supports in 15 minute conversation to aid verbal expression given occasional min A    Time 8    Period Weeks    Status On-going    Target Date 05/27/21      SLP LONG TERM GOAL #3   Title Pt will demonstrate ability to utilize personally relevant augmentative communication aid/system to aid verbal expression as needed in the future given occasional mod A     Time 8    Period Weeks    Status On-going    Target Date 05/27/21      SLP LONG TERM GOAL #4   Title Pt husband will demonstrate capability for assisting pt with VNeST  and SFA at home, in 2 sessions    Time 6    Period Weeks    Status On-going    Target Date 05/27/21              Plan - 04/20/21 1715     Clinical Impression Statement Elexia was referred by neurology due to worsening word finding and memory associated with dementia (neuropsychologist questioned frontotemporal dementia versus Alzheimers). Decline in recall documented ~1 years ago but language decline has occured more recently. Today, pt was again accompanied by husband, Orpah Greek and VNeST was demonstrated with [place] and Orpah Greek. Pt is scheduling fthrough March to cont to treat pt's aphasia and cognitive linguistic deficits. SLP cont to recommend skilled ST intervention to educate and train functional compensations to aid memory/attention and safety awareness, optimize maintenance of current language skills, as well as support future language skills given expected disease progression.    Speech Therapy Frequency 2x / week   husband requested 1x/week   Duration 8 weeks    Treatment/Interventions Compensatory strategies;Functional tasks;Patient/family education;Cueing hierarchy;Multimodal communcation approach;Language facilitation;Compensatory techniques;Internal/external aids;SLP instruction and feedback    Potential to Achieve Goals Fair    Potential Considerations Medical prognosis             Patient will benefit from skilled therapeutic intervention in order to improve the following deficits and impairments:   Aphasia  Cognitive communication deficit    Problem List Patient Active Problem List   Diagnosis Date Noted   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   Cerebrovascular disease    IDA (iron deficiency anemia) 12/28/2020   Vitamin B 12 deficiency  10/16/2020   Osteopenia after menopause    GERD (gastroesophageal reflux disease)    History of chicken pox    Sun-damaged skin 05/23/2020   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   Headache 02/11/2018   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, [place], [place], [address] Phone: [phone]   Fax:  [phone]   Name: INDIA JOLIN MRN: [id number] Date of Birth: 09/18/1945

## 2021-04-26 ENCOUNTER — Ambulatory Visit: Payer: PPO

## 2021-04-26 ENCOUNTER — Other Ambulatory Visit: Payer: Self-pay

## 2021-04-26 DIAGNOSIS — R4701 Aphasia: Secondary | ICD-10-CM

## 2021-04-26 DIAGNOSIS — R41841 Cognitive communication deficit: Secondary | ICD-10-CM

## 2021-04-26 NOTE — Therapy (Signed)
[facility] [address], [place], [place], [address] Phone: [phone]   Fax:  [phone]  Speech Language Pathology Treatment  Patient Details  Name: Erika [name] MRN: [id number] Date of Birth: 1945-12-24 Referring Provider (SLP): [name], MD   Encounter Date: 04/26/2021   End of Session - 04/26/21 1654     Visit Number 7    Number of Visits 17    Date for SLP Re-Evaluation 05/27/21    Authorization Type HTA    SLP Start Time 1317    SLP Stop Time  8938    SLP Time Calculation (min) 42 min    Activity Tolerance Patient tolerated treatment well             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   Cerebrovascular disease    Chest pain 01/19/2014   Chronic rhinitis 01/13/2009   Diverticulosis of colon    colonoscopy 04/14/1999   Dry mouth 03/13/2012   Fatigue 08/02/2014   GERD (gastroesophageal reflux disease)    Headache 02/11/2018   History of chicken pox    as a child   History of fibrocystic disease of breast    History of measles    as a child   History of mumps    as a child   Hyperlipidemia, mixed 02/20/2007   Goal LDL < 130 HBP/Pos fm hx   Hypertension    Hypokalemia 06/24/2012   Hyponatremia 08/02/2014   IBS (irritable bowel syndrome) 04/02/2011   IDA (iron deficiency anemia) 12/28/2020   Low vitamin D level 01/09/2016   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   Obstructive sleep apnea 05/23/2017   uses CPAP nightly   Osteopenia after menopause    Overactive bladder 04/02/2011   Pedal edema 05/20/2020   Shoulder pain, bilateral 06/14/2011   Thoracic back pain 07/31/2017   Vitamin B 12 deficiency 10/16/2020    Past Surgical History:   Procedure Laterality Date   BREAST REDUCTION SURGERY Bilateral 1986   BUNIONECTOMY Bilateral 1980   CATARACT EXTRACTION, BILATERAL     COLONOSCOPY  2011   HAMMER TOE SURGERY Right 12/03/2013   foot   MOUTH SURGERY  01/10/2013   negative   TONSILLECTOMY     UPPER GASTROINTESTINAL ENDOSCOPY  2016   RK    There were no vitals filed for this visit.   Subjective Assessment - 04/26/21 1649     Subjective "I think I'm doing much better." Abran Richard)    Patient is accompained by: Family member   [name] husband   Currently in Pain? No/denies                   ADULT SLP TREATMENT - 04/26/21 1649       General Information   Behavior/Cognition Alert;Cooperative;Pleasant mood;Requires cueing      Treatment Provided   Treatment provided Cognitive-Linquistic      Cognitive-Linquistic Treatment   Treatment focused on Cognition;Aphasia    Skilled Treatment SLP reviewed pt's two VNeST she had completed with daughter, and one without. Completed with daughter 100% correct. Completed by herself halfway, without questions answered or expanded sentence; pt did not demonstrate understanding of the verb she chose "build" with her objects Engineering geologist, 3-wheeler, and tires). SLP assisted  pt with mod verbal and written cues about her verb. With mod cues pt chose three new objects. She demonstrated decr'd spelling ability rarely and req'd extra time and min A for correct spelling. SLP reiterated to pt rationale for VNeST and that she should complete these with Orpah Greek or with Hoyle Sauer, or someone else who has been introduced/educated about procedure. Pt described her ability to take meds and track appointments with independence - husband agreed with pt's assessment of her ability with these tasks.      Assessment / Recommendations / Plan   Plan Continue with current plan of care      Progression Toward Goals   Progression toward goals Progressing toward goals              SLP Education - 04/26/21  1653     Education Details VNeST procedure    Person(s) Educated Patient;Spouse    Methods Explanation    Comprehension Verbalized understanding;Need further instruction              SLP Short Term Goals - 04/26/21 1656       SLP SHORT TERM GOAL #1   Title Pt will implement external memory/attention compensations to aid daily functioning and improve safety given usual min A over 2 sessions    Baseline 04-26-21    Time 4    Period Weeks    Status On-going    Target Date 04/29/21      SLP SHORT TERM GOAL #2   Title Pt will utilize anomia compensations to aid word finding on functional structured speech tasks given occasional min A over 2 sessions    Baseline 04-20-21    Time --    Period --    Status Achieved    Target Date 04/29/21      SLP SHORT TERM GOAL #3   Title Pt will utilize anomia compensations and/or augmentative aids in simple 5-10 minute conversation given occasional min to mod A over 2 sessions    Baseline 04-20-21    Time --    Period --    Status Achieved    Target Date 04/29/21      SLP SHORT TERM GOAL #4   Title pt's husband will demonsrate effective cueing for pt to complete VNeST or SFA at home    Time --    Period --    Status Achieved    Target Date 04/29/21              SLP Long Term Goals - 04/26/21 1701       SLP LONG TERM GOAL #1   Title Caregivers will assist with implementation and carryover of memory/attention compensations to aid daily functioning and safety at home given rare min A    Time 8    Period Weeks    Status On-going    Target Date 05/27/21      SLP LONG TERM GOAL #2   Title Pt will utilize anomia compensations or augmentative external supports in 15 minute conversation to aid verbal expression given occasional min A    Time 8    Period Weeks    Status On-going    Target Date 05/27/21      SLP LONG TERM GOAL #3   Title Pt will demonstrate ability to utilize personally relevant augmentative communication  aid/system to aid verbal expression as needed in the future given occasional mod A    Time 8    Period Weeks    Status  On-going    Target Date 05/27/21      SLP LONG TERM GOAL #4   Title Pt husband will demonstrate capability for assisting pt with VNeST and SFA at home, in 2 sessions    Time 6    Period Weeks    Status On-going    Target Date 05/27/21              Plan - 04/26/21 1655     Clinical Impression Statement Adalei was referred by neurology due to worsening word finding and memory associated with dementia (neuropsychologist questioned frontotemporal dementia versus Alzheimers). Decline in recall documented ~1 years ago but language decline has occured more recently. Today, pt was again accompanied by husband, Orpah Greek and VNeST was reviewed with pt/husband - SLP reiterated pt should do this task with assistance. SLP cont to recommend skilled ST intervention to educate and train functional compensations to aid memory/attention and safety awareness, optimize maintenance of current language skills, as well as support future language skills given expected disease progression.    Speech Therapy Frequency 2x / week   husband requested 1x/week   Duration 8 weeks    Treatment/Interventions Compensatory strategies;Functional tasks;Patient/family education;Cueing hierarchy;Multimodal communcation approach;Language facilitation;Compensatory techniques;Internal/external aids;SLP instruction and feedback    Potential to Achieve Goals Fair    Potential Considerations Medical prognosis             Patient will benefit from skilled therapeutic intervention in order to improve the following deficits and impairments:   Aphasia  Cognitive communication deficit    Problem List Patient Active Problem List   Diagnosis Date Noted   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   Cerebrovascular disease    IDA (iron deficiency anemia)  12/28/2020   Vitamin B 12 deficiency 10/16/2020   Osteopenia after menopause    GERD (gastroesophageal reflux disease)    History of chicken pox    Sun-damaged skin 05/23/2020   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   Headache 02/11/2018   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, [place], [place], [address] Phone: [phone]   Fax:  [phone]   Name: ANDRIANNA MANALANG MRN: [id number] Date of Birth: Apr 11, 1945

## 2021-04-28 ENCOUNTER — Ambulatory Visit: Payer: PPO

## 2021-04-28 ENCOUNTER — Other Ambulatory Visit: Payer: Self-pay

## 2021-04-28 DIAGNOSIS — R41841 Cognitive communication deficit: Secondary | ICD-10-CM

## 2021-04-28 DIAGNOSIS — R4701 Aphasia: Secondary | ICD-10-CM | POA: Diagnosis not present

## 2021-04-28 NOTE — Therapy (Signed)
[facility] [address] [place], [place], [address] Phone: [phone]   Fax:  [phone]  Speech Language Pathology Treatment  Patient Details  Name: Erika [name] MRN: [id number] Date of Birth: 1945/06/28 Referring Provider (SLP): [name], MD   Encounter Date: 04/28/2021   End of Session - 04/28/21 1008     Visit Number 8    Number of Visits 17    Date for SLP Re-Evaluation 05/27/21    Authorization Type HTA    SLP Start Time 0848    SLP Stop Time  0927    SLP Time Calculation (min) 39 min    Activity Tolerance Patient tolerated treatment well             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   Cerebrovascular disease    Chest pain 01/19/2014   Chronic rhinitis 01/13/2009   Diverticulosis of colon    colonoscopy 04/14/1999   Dry mouth 03/13/2012   Fatigue 08/02/2014   GERD (gastroesophageal reflux disease)    Headache 02/11/2018   History of chicken pox    as a child   History of fibrocystic disease of breast    History of measles    as a child   History of mumps    as a child   Hyperlipidemia, mixed 02/20/2007   Goal LDL < 130 HBP/Pos fm hx   Hypertension    Hypokalemia 06/24/2012   Hyponatremia 08/02/2014   IBS (irritable bowel syndrome) 04/02/2011   IDA (iron deficiency anemia) 12/28/2020   Low vitamin D level 01/09/2016   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   Obstructive sleep apnea 05/23/2017   uses CPAP nightly   Osteopenia after menopause    Overactive bladder 04/02/2011   Pedal edema 05/20/2020   Shoulder pain, bilateral 06/14/2011   Thoracic back pain 07/31/2017   Vitamin B 12 deficiency 10/16/2020    Past Surgical History:   Procedure Laterality Date   BREAST REDUCTION SURGERY Bilateral 1986   BUNIONECTOMY Bilateral 1980   CATARACT EXTRACTION, BILATERAL     COLONOSCOPY  2011   HAMMER TOE SURGERY Right 12/03/2013   foot   MOUTH SURGERY  01/10/2013   negative   TONSILLECTOMY     UPPER GASTROINTESTINAL ENDOSCOPY  2016   RK    There were no vitals filed for this visit.   Subjective Assessment - 04/28/21 0933     Subjective "She was talking with old patients during dinner last night. I was just sitting back and watching." Orpah Greek)    Patient is accompained by: Family member   [name] husband   Currently in Pain? No/denies                   ADULT SLP TREATMENT - 04/28/21 0936       General Information   Behavior/Cognition Alert;Cooperative;Pleasant mood      Treatment Provided   Treatment provided Cognitive-Linquistic      Cognitive-Linquistic Treatment   Treatment focused on Aphasia;Cognition    Skilled Treatment Pt completed two VNeST on her own - one was 100% correct and the other had an expanded sentence that didn't make sense semantically ("...shuts the door every day because it's going to rain."). Pt req'd min-mod A  to modify sentence for WNL semantic context. Pt described her ability with meal planning, tracking appointments and social events using her calendar - she communicated that these skills are WNL. Some discussion about pt driving (mod complex) was WNL and had no anomia. Pt has not been driving much in the last 4-8 weeks as it was recommended pt not drive to unfamiliar places. SLP reiterated to pt that she should be able to drive to familiar places - she suggested [employer] and SLP agreed as she has driven to [facility] for many years. Pt and SLP agree pt could decr to once/week beginning next week.      Assessment / Recommendations / Plan   Plan --   decr to once/week beginning next week     Progression Toward Goals   Progression toward goals Progressing toward goals                 SLP Short Term Goals - 04/28/21 1010       SLP SHORT TERM GOAL #1   Title Pt will implement external memory/attention compensations to aid daily functioning and improve safety given usual min A over 2 sessions    Baseline 04-26-21    Status Achieved    Target Date 04/29/21      SLP SHORT TERM GOAL #2   Title Pt will utilize anomia compensations to aid word finding on functional structured speech tasks given occasional min A over 2 sessions    Baseline 04-20-21    Status Achieved    Target Date 04/29/21      SLP SHORT TERM GOAL #3   Title Pt will utilize anomia compensations and/or augmentative aids in simple 5-10 minute conversation given occasional min to mod A over 2 sessions    Baseline 04-20-21    Status Achieved    Target Date 04/29/21      SLP SHORT TERM GOAL #4   Title pt's husband will demonsrate effective cueing for pt to complete VNeST or SFA at home    Status Achieved    Target Date 04/29/21              SLP Long Term Goals - 04/28/21 1010       SLP LONG TERM GOAL #1   Title Caregivers will assist with implementation and carryover of memory/attention compensations to aid daily functioning and safety at home given rare min A    Status Achieved      SLP LONG TERM GOAL #2   Title Pt will utilize anomia compensations or augmentative external supports in 15 minute conversation to aid verbal expression given occasional min A x3 sessions    Baseline 04-28-21    Time 8    Period Weeks    Status Revised    Target Date 05/27/21      SLP LONG TERM GOAL #3   Title Pt will demonstrate ability to utilize personally relevant augmentative communication aid/system to aid verbal expression as needed in the future given occasional mod A    Time 8    Period Weeks    Status On-going    Target Date 05/27/21      SLP LONG TERM GOAL #4   Title Pt husband will demonstrate capability for assisting pt with VNeST and SFA at home, in 2 sessions    Time 6    Period Weeks     Status On-going    Target Date 05/27/21  Plan - 04/28/21 1009     Clinical Impression Statement Erika [name] was referred by neurology due to worsening word finding and memory associated with dementia (neuropsychologist questioned frontotemporal dementia versus Alzheimers). SLP agreed pt was appropriate to decr to once/week. SLP cont to recommend skilled ST intervention to educate and train functional compensations to aid memory/attention and safety awareness, optimize maintenance of current language skills, as well as support future language skills given expected disease progression.    Speech Therapy Frequency 2x / week   husband requested 1x/week   Duration 8 weeks    Treatment/Interventions Compensatory strategies;Functional tasks;Patient/family education;Cueing hierarchy;Multimodal communcation approach;Language facilitation;Compensatory techniques;Internal/external aids;SLP instruction and feedback    Potential to Achieve Goals Fair    Potential Considerations Medical prognosis             Patient will benefit from skilled therapeutic intervention in order to improve the following deficits and impairments:   Aphasia  Cognitive communication deficit    Problem List Patient Active Problem List   Diagnosis Date Noted   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   Cerebrovascular disease    IDA (iron deficiency anemia) 12/28/2020   Vitamin B 12 deficiency 10/16/2020   Osteopenia after menopause    GERD (gastroesophageal reflux disease)    History of chicken pox    Sun-damaged skin 05/23/2020   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   Headache 02/11/2018   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 [place], [place], [address] Phone: [phone]   Fax:  [phone]   Name: Erika [name] MRN: [id number] Date of Birth: November 10, 1945

## 2021-05-03 ENCOUNTER — Ambulatory Visit: Payer: PPO

## 2021-05-03 ENCOUNTER — Other Ambulatory Visit: Payer: Self-pay

## 2021-05-03 DIAGNOSIS — R4701 Aphasia: Secondary | ICD-10-CM | POA: Diagnosis not present

## 2021-05-03 DIAGNOSIS — R41841 Cognitive communication deficit: Secondary | ICD-10-CM

## 2021-05-03 NOTE — Patient Instructions (Signed)
°  Do at least 3 VNeST before next session, and please do 2 semantic feature analysis (SFA) before next session.  Next session we will talk for about 15 minutes and will also take a look at you and Orpah Greek working together for [employer] and SFA.

## 2021-05-03 NOTE — Therapy (Signed)
[facility] [address], [place], [place], [address] Phone: [phone]   Fax:  [phone]  Speech Language Pathology Treatment  Patient Details  Name: Erika [name] MRN: [id number] Date of Birth: 1945/04/14 Referring Provider (SLP): [name], MD   Encounter Date: 05/03/2021   End of Session - 05/03/21 0953     Visit Number 9    Number of Visits 17    Date for SLP Re-Evaluation 05/27/21    Authorization Type HTA    SLP Start Time 0848    SLP Stop Time  0930    SLP Time Calculation (min) 42 min    Activity Tolerance Patient tolerated treatment well             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   Cerebrovascular disease    Chest pain 01/19/2014   Chronic rhinitis 01/13/2009   Diverticulosis of colon    colonoscopy 04/14/1999   Dry mouth 03/13/2012   Fatigue 08/02/2014   GERD (gastroesophageal reflux disease)    Headache 02/11/2018   History of chicken pox    as a child   History of fibrocystic disease of breast    History of measles    as a child   History of mumps    as a child   Hyperlipidemia, mixed 02/20/2007   Goal LDL < 130 HBP/Pos fm hx   Hypertension    Hypokalemia 06/24/2012   Hyponatremia 08/02/2014   IBS (irritable bowel syndrome) 04/02/2011   IDA (iron deficiency anemia) 12/28/2020   Low vitamin D level 01/09/2016   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   Obstructive sleep apnea 05/23/2017   uses CPAP nightly   Osteopenia after menopause    Overactive bladder 04/02/2011   Pedal edema 05/20/2020   Shoulder pain, bilateral 06/14/2011   Thoracic back pain 07/31/2017   Vitamin B 12 deficiency 10/16/2020    Past Surgical History:   Procedure Laterality Date   BREAST REDUCTION SURGERY Bilateral 1986   BUNIONECTOMY Bilateral 1980   CATARACT EXTRACTION, BILATERAL     COLONOSCOPY  2011   HAMMER TOE SURGERY Right 12/03/2013   foot   MOUTH SURGERY  01/10/2013   negative   TONSILLECTOMY     UPPER GASTROINTESTINAL ENDOSCOPY  2016   RK    There were no vitals filed for this visit.   Subjective Assessment - 05/03/21 0934     Subjective "I really am doing better - not getting stuck like I was before." [name] agrees.    Patient is accompained by: Family member   [name] husband   Currently in Pain? No/denies                   ADULT SLP TREATMENT - 05/03/21 0935       General Information   Behavior/Cognition Alert;Cooperative;Pleasant mood      Treatment Provided   Treatment provided Cognitive-Linquistic      Cognitive-Linquistic Treatment   Treatment focused on Aphasia;Cognition    Skilled Treatment Completed two VNeST since last ST - 100% correct. Erika [name] is not having [name] or family check them over - SLP suggested this. SLP reviewed pt's remaining LTGs and pt/husband agreed they sound reasonable. SLP to assess [name]'s compentence with assistance  with VNeST and semantic feature analysis and give cues as necessary. Pt and husband with no further questions at this time.      Assessment / Recommendations / Plan   Plan Continue with current plan of care      Progression Toward Goals   Progression toward goals Progressing toward goals                SLP Short Term Goals - 04/28/21 1010       SLP SHORT TERM GOAL #1   Title Pt will implement external memory/attention compensations to aid daily functioning and improve safety given usual min A over 2 sessions    Baseline 04-26-21    Status Achieved    Target Date 04/29/21      SLP SHORT TERM GOAL #2   Title Pt will utilize anomia compensations to aid word finding on functional structured speech tasks given occasional min A over 2 sessions     Baseline 04-20-21    Status Achieved    Target Date 04/29/21      SLP SHORT TERM GOAL #3   Title Pt will utilize anomia compensations and/or augmentative aids in simple 5-10 minute conversation given occasional min to mod A over 2 sessions    Baseline 04-20-21    Status Achieved    Target Date 04/29/21      SLP SHORT TERM GOAL #4   Title pt's husband will demonsrate effective cueing for pt to complete VNeST or SFA at home    Status Achieved    Target Date 04/29/21              SLP Long Term Goals - 05/03/21 0916       SLP LONG TERM GOAL #1   Title Caregivers will assist with implementation and carryover of memory/attention compensations to aid daily functioning and safety at home given rare min A    Status Achieved      SLP LONG TERM GOAL #2   Title Pt will utilize anomia compensations or augmentative external supports in 15 minute conversation to aid verbal expression given occasional min A x3 sessions    Baseline 04-28-21, 05-03-21    Time 8    Period Weeks    Status On-going      SLP LONG TERM GOAL #3   Title Pt will demonstrate ability to utilize personally relevant augmentative communication aid/system to aid verbal expression as needed in the future given occasional mod A    Status Deferred      SLP LONG TERM GOAL #4   Title Pt husband will demonstrate capability for assisting pt with VNeST and SFA at home, in 2 sessions    Time 6    Period Weeks    Status On-going              Plan - 05/03/21 0953     Clinical Impression Statement Erika [name] was referred by neurology due to worsening word finding and memory associated with dementia (neuropsychologist questioned frontotemporal dementia versus Alzheimers). SLP agreed pt was appropriate to decr to once/week. SLP cont to recommend skilled ST intervention to educate and train functional compensations to aid memory/attention and safety awareness, optimize maintenance of current language skills, as well as support future  language skills given expected disease progression. She may return after her assessment at [facility] in May, if ST is recommended.    Speech Therapy Frequency 1x /week   husband requested 1x/week   Duration 8 weeks  Treatment/Interventions Compensatory strategies;Functional tasks;Patient/family education;Cueing hierarchy;Multimodal communcation approach;Language facilitation;Compensatory techniques;Internal/external aids;SLP instruction and feedback    Potential to Achieve Goals Fair    Potential Considerations Medical prognosis             Patient will benefit from skilled therapeutic intervention in order to improve the following deficits and impairments:   Aphasia  Cognitive communication deficit    Problem List Patient Active Problem List   Diagnosis Date Noted   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   Cerebrovascular disease    IDA (iron deficiency anemia) 12/28/2020   Vitamin B 12 deficiency 10/16/2020   Osteopenia after menopause    GERD (gastroesophageal reflux disease)    History of chicken pox    Sun-damaged skin 05/23/2020   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, [place], [place], [address] Phone: [phone]   Fax:  [phone]   Name: Erika [name] MRN:  [id number] Date of Birth: 1945-11-16

## 2021-05-10 ENCOUNTER — Other Ambulatory Visit: Payer: Self-pay

## 2021-05-10 ENCOUNTER — Ambulatory Visit: Payer: PPO | Attending: Neurology

## 2021-05-10 DIAGNOSIS — R41841 Cognitive communication deficit: Secondary | ICD-10-CM | POA: Insufficient documentation

## 2021-05-10 DIAGNOSIS — R4701 Aphasia: Secondary | ICD-10-CM | POA: Diagnosis not present

## 2021-05-10 NOTE — Therapy (Signed)
[facility] [address], [address] [place], [place], [address] Phone: [phone]   Fax:  [phone]  Speech Language Pathology Treatment/Progress note  Patient Details  Name: Erika [name] MRN: [id number] Date of Birth: 24-Sep-1945 Referring Provider (SLP): Huston Foley, MD   Encounter Date: 05/10/2021   End of Session - 05/10/21 1305     Visit Number 10    Number of Visits 17    Date for SLP Re-Evaluation 05/27/21    Authorization Type HTA    SLP Start Time 0849    SLP Stop Time  0930    SLP Time Calculation (min) 41 min    Activity Tolerance Patient tolerated treatment well             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   Cerebrovascular disease    Chest pain 01/19/2014   Chronic rhinitis 01/13/2009   Diverticulosis of colon    colonoscopy 04/14/1999   Dry mouth 03/13/2012   Fatigue 08/02/2014   GERD (gastroesophageal reflux disease)    Headache 02/11/2018   History of chicken pox    as a child   History of fibrocystic disease of breast    History of measles    as a child   History of mumps    as a child   Hyperlipidemia, mixed 02/20/2007   Goal LDL < 130 HBP/Pos fm hx   Hypertension    Hypokalemia 06/24/2012   Hyponatremia 08/02/2014   IBS (irritable bowel syndrome) 04/02/2011   IDA (iron deficiency anemia) 12/28/2020   Low vitamin D level 01/09/2016   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   Obstructive sleep apnea 05/23/2017   uses CPAP nightly   Osteopenia after menopause    Overactive bladder 04/02/2011   Pedal edema 05/20/2020   Shoulder pain, bilateral 06/14/2011   Thoracic back pain 07/31/2017   Vitamin B 12 deficiency 10/16/2020    Past  Surgical History:  Procedure Laterality Date   BREAST REDUCTION SURGERY Bilateral 1986   BUNIONECTOMY Bilateral 1980   CATARACT EXTRACTION, BILATERAL     COLONOSCOPY  2011   HAMMER TOE SURGERY Right 12/03/2013   foot   MOUTH SURGERY  01/10/2013   negative   TONSILLECTOMY     UPPER GASTROINTESTINAL ENDOSCOPY  2016   [facility]     Speech Therapy Progress Note  Dates of Reporting Period: 03-31-21 to present  Subjective Statement: Pt has been seen for 10 ST sessions focusing on aphasia.  Objective: See below  Goal Update: See below  Plan: Cont to see pt until she has met goals.  Reason Skilled Services are Required: Pt has not yet reached rehabilitative potential.   There were no vitals filed for this visit.   Subjective Assessment - 05/10/21 0933     Subjective "I'm by myself today!"    Currently in Pain? Yes    Pain Score 3     Pain Location Head    Pain Orientation Right    Pain Descriptors / Indicators Headache    Pain Type Acute pain    Pain Onset 1 to 4 weeks ago                   ADULT SLP TREATMENT - 05/10/21 9528  General Information   Behavior/Cognition Alert;Cooperative;Pleasant mood      Treatment Provided   Treatment provided Cognitive-Linquistic      Cognitive-Linquistic Treatment   Treatment focused on Aphasia;Cognition    Skilled Treatment SLP reviewed pt's VNeST and semantic feature analysis (SFA) with her. Pt with 4 errors - overall 96% success - two errors were with "why" section in her 5 VneST tasks. the other two were misunderstanding the categories on her completed SFA. In conversation pt had 2 dysnomias, but functional errors which did not hinder SLP comprehension of pt message.      Assessment / Recommendations / Plan   Plan Continue with current plan of care      Progression Toward Goals   Progression toward goals Progressing toward goals                SLP Short Term Goals - 04/28/21 1010       SLP SHORT TERM GOAL  #1   Title Pt will implement external memory/attention compensations to aid daily functioning and improve safety given usual min A over 2 sessions    Baseline 04-26-21    Status Achieved    Target Date 04/29/21      SLP SHORT TERM GOAL #2   Title Pt will utilize anomia compensations to aid word finding on functional structured speech tasks given occasional min A over 2 sessions    Baseline 04-20-21    Status Achieved    Target Date 04/29/21      SLP SHORT TERM GOAL #3   Title Pt will utilize anomia compensations and/or augmentative aids in simple 5-10 minute conversation given occasional min to mod A over 2 sessions    Baseline 04-20-21    Status Achieved    Target Date 04/29/21      SLP SHORT TERM GOAL #4   Title pt's husband will demonsrate effective cueing for pt to complete VNeST or SFA at home    Status Achieved    Target Date 04/29/21              SLP Long Term Goals - 05/10/21 1306       SLP LONG TERM GOAL #1   Title Caregivers will assist with implementation and carryover of memory/attention compensations to aid daily functioning and safety at home given rare min A    Status Achieved      SLP LONG TERM GOAL #2   Title Pt will utilize anomia compensations or augmentative external supports in 15 minute conversation to aid verbal expression given occasional min A x3 sessions    Baseline 04-28-21, 05-03-21    Status Achieved      SLP LONG TERM GOAL #3   Title Pt will demonstrate ability to utilize personally relevant augmentative communication aid/system to aid verbal expression as needed in the future given occasional mod A    Status Deferred      SLP LONG TERM GOAL #4   Title Pt husband will demonstrate capability for assisting pt with VNeST and SFA at home, in 2 sessions    Time 6    Period Weeks    Status On-going    Target Date 05/27/21              Plan - 05/10/21 1305     Clinical Impression Statement Erika [name] was referred by neurology due to worsening  word finding and memory associated with dementia (neuropsychologist questioned frontotemporal dementia versus Alzheimers). SLP agreed pt was appropriate to decr to  once/week. SLP cont to recommend skilled ST intervention to educate and train functional compensations to aid memory/attention and safety awareness, optimize maintenance of current language skills, as well as support future language skills given expected disease progression. She may return after her assessment at [facility] in May, if ST is recommended.    Speech Therapy Frequency 1x /week   husband requested 1x/week   Duration 8 weeks    Treatment/Interventions Compensatory strategies;Functional tasks;Patient/family education;Cueing hierarchy;Multimodal communcation approach;Language facilitation;Compensatory techniques;Internal/external aids;SLP instruction and feedback    Potential to Achieve Goals Fair    Potential Considerations Medical prognosis             Patient will benefit from skilled therapeutic intervention in order to improve the following deficits and impairments:   Aphasia  Cognitive communication deficit    Problem List Patient Active Problem List   Diagnosis Date Noted   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   Cerebrovascular disease    IDA (iron deficiency anemia) 12/28/2020   Vitamin B 12 deficiency 10/16/2020   Osteopenia after menopause    GERD (gastroesophageal reflux disease)    History of chicken pox    Sun-damaged skin 05/23/2020   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   Headache 02/11/2018   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, [address] [place], [place], [address] Phone: [phone]   Fax:  [phone]   Name: Erika [name] MRN: [id number] Date of Birth: 1945-10-07

## 2021-05-16 ENCOUNTER — Ambulatory Visit: Payer: PPO | Admitting: Pulmonary Disease

## 2021-05-16 ENCOUNTER — Other Ambulatory Visit: Payer: Self-pay

## 2021-05-16 ENCOUNTER — Encounter: Payer: Self-pay | Admitting: Pulmonary Disease

## 2021-05-16 DIAGNOSIS — I10 Essential (primary) hypertension: Secondary | ICD-10-CM | POA: Diagnosis not present

## 2021-05-16 DIAGNOSIS — G4733 Obstructive sleep apnea (adult) (pediatric): Secondary | ICD-10-CM | POA: Diagnosis not present

## 2021-05-16 NOTE — Progress Notes (Signed)
? ?  Subjective:  ? ? Patient ID: Erika [name], female    DOB: 01/09/1946, 76 y.o.   MRN: [id number] ? ?HPI ? ?76 yo for FU of mild obstructive sleep apnea ? ?PMH -refractory hypertension ?Alzheimer's ? ?Annual follow-up visit.  She is accompanied by her husband Erika [name]. ?Unfortunately she has developed increasing memory issues and is being evaluated for dementia by neurology, Dr. Arnell Sieving, notes reviewed.  She also had some aphasia type issues and is undergoing speech rehab. ?History is obtained from her husband. ?She is sleeping well, no snoring, no daytime somnolence. ?She has adjusted well to fullface mask and is very compliant ?Denies mouth dryness ?  ?  ?Significant tests/ events reviewed ? ?HST 3/ 2020 showed mild sleep apnea with AHI 11/hour ? ?Review of Systems ?neg for any significant sore throat, dysphagia, itching, sneezing, nasal congestion or excess/ purulent secretions, fever, chills, sweats, unintended wt loss, pleuritic or exertional cp, hempoptysis, orthopnea pnd or change in chronic leg swelling. Also denies presyncope, palpitations, heartburn, abdominal pain, nausea, vomiting, diarrhea or change in bowel or urinary habits, dysuria,hematuria, rash, arthralgias, visual complaints, headache, numbness weakness or ataxia. ? ?   ?Objective:  ? Physical Exam ? ?Gen. Pleasant, well-nourished, in no distress ?ENT - no thrush, no pallor/icterus,no post nasal drip ?Neck: No JVD, no thyromegaly, no carotid bruits ?Lungs: no use of accessory muscles, no dullness to percussion, clear without rales or rhonchi  ?Cardiovascular: Rhythm regular, heart sounds  normal, no murmurs or gallops, no peripheral edema ?Musculoskeletal: No deformities, no cyanosis or clubbing  ?Neuro -halting speech, has short-term memory issues ? ? ?   ?Assessment & Plan:  ? ? ?

## 2021-05-16 NOTE — Assessment & Plan Note (Signed)
Blood pressure slight high today systolic, diastolic is fine. ?She denies headache or nausea or blurred vision ?We will continue on her meds and recheck with PCP ?

## 2021-05-16 NOTE — Assessment & Plan Note (Signed)
CPAP download was reviewed which shows excellent control of events on auto settings 10 to 20 cm with average pressure of 12 and maximum pressure of 13 cm with no residual events and minimal leak.  She is very compliant close to 7 hours per night without any missed nights ? ?CPAP is certainly helped improve her daytime somnolence and fatigue. ?CPAP supplies will be renewed for a year. ?We will fine-tune auto CPAP settings to 10 to 15 cm ?

## 2021-05-16 NOTE — Patient Instructions (Signed)
?  CPAP is working well ? ?X change auto to 10-15 cm ?

## 2021-05-17 ENCOUNTER — Ambulatory Visit: Payer: PPO

## 2021-05-17 DIAGNOSIS — R4701 Aphasia: Secondary | ICD-10-CM

## 2021-05-17 DIAGNOSIS — R41841 Cognitive communication deficit: Secondary | ICD-10-CM

## 2021-05-17 NOTE — Therapy (Signed)
[place] ?[facility] ?[address], [address] ?[place], [place], [address] ?Phone: [phone]   Fax:  [phone] ? ?Speech Language Pathology Treatment ? ?Patient Details  ?Name: Erika [name] ?MRN: [id number] ?Date of Birth: Nov 03, 1945 ?Referring Provider (SLP): [name], MD ? ? ?Encounter Date: 05/17/2021 ? ? End of Session - 05/17/21 1152   ? ? Visit Number 11   ? Number of Visits 17   ? Date for SLP Re-Evaluation 05/27/21   ? Authorization Type HTA   ? SLP Start Time [phone]   ? SLP Stop Time  0931   ? SLP Time Calculation (min) 42 min   ? Activity Tolerance Patient tolerated treatment well   ? ?  ?  ? ?  ? ? ?Past Medical History:  ?Diagnosis Date  ? Acute bilateral knee pain 05/15/2016  ? Anterior cervical lymphadenopathy 02/08/2013  ? Arthritis 04/02/2011  ? Bronchitis 04/02/2011  ? 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 Cerebrovascular disease   ? Chest pain 01/19/2014  ? Chronic rhinitis 01/13/2009  ? Diverticulosis of colon   ? colonoscopy 04/14/1999  ? Dry mouth 03/13/2012  ? Fatigue 08/02/2014  ? GERD (gastroesophageal reflux disease)   ? Headache 02/11/2018  ? History of chicken pox   ? as a child  ? History of fibrocystic disease of breast   ? History of measles   ? as a child  ? History of mumps   ? as a child  ? Hyperlipidemia, mixed 02/20/2007  ? Goal LDL < 130 HBP/Pos fm hx  ? Hypertension   ? Hypokalemia 06/24/2012  ? Hyponatremia 08/02/2014  ? IBS (irritable bowel syndrome) 04/02/2011  ? IDA (iron deficiency anemia) 12/28/2020  ? Low vitamin D level 01/09/2016  ? 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  ? Obstructive sleep apnea 05/23/2017  ? uses CPAP nightly  ? Osteopenia after menopause   ? Overactive bladder 04/02/2011  ? Pedal edema 05/20/2020  ? Shoulder pain, bilateral 06/14/2011  ? Thoracic back pain 07/31/2017  ? Vitamin B 12 deficiency 10/16/2020  ? ? ?Past Surgical  History:  ?Procedure Laterality Date  ? BREAST REDUCTION SURGERY Bilateral 1986  ? BUNIONECTOMY Bilateral 1980  ? CATARACT EXTRACTION, BILATERAL    ? COLONOSCOPY  2011  ? HAMMER TOE SURGERY Right 12/03/2013  ? foot  ? MOUTH SURGERY  01/10/2013  ? negative  ? TONSILLECTOMY    ? UPPER GASTROINTESTINAL ENDOSCOPY  2016  ? RK  ? ? ?There were no vitals filed for this visit. ? ? ? ? ? ? ? ? ? ADULT SLP TREATMENT - 05/17/21 0938   ? ?  ? General Information  ? Behavior/Cognition Alert;Cooperative;Pleasant mood   ?  ? Treatment Provided  ? Treatment provided Cognitive-Linquistic   ?  ? Cognitive-Linquistic Treatment  ? Treatment focused on Aphasia;Patient/family/caregiver education   ? Skilled Treatment SLP and pt reviewed her VNeST and semantic feature analysis. Pt req'd min-mod A usually for her "why" phrase on expanded sentneces on VNeST. Husband stated he has not worked with pt and SLP used this opportunity to reinforce that husband should be available for pt should she require assistance - which is what pt requires. Pt relayed difficulty yesterday at MD office where she had difficulty with word finding from anxiety. SLP highlighted that this is different from anomia in conversation in that this basis is more behavior based than neurologically based. Her anomia  can be exacerbated by anxiety but she should not be worried that her anomia is worsening. SLP said if anomia worsens when she is talking with family then it would be appropriate to notify neurologist.   ?  ? Assessment / Recommendations / Plan  ? Plan Continue with current plan of care   ?  ? Progression Toward Goals  ? Progression toward goals Progressing toward goals   ? ?  ?  ? ?  ? ? ? SLP Education - 05/17/21 1152   ? ? Education Details see daily note from today   ? Person(s) Educated Patient   ? Methods Explanation   ? Comprehension Verbalized understanding   ? ?  ?  ? ?  ? ? ? SLP Short Term Goals - 05/17/21 1153   ? ?  ? SLP SHORT TERM GOAL #1  ? Title  Pt will implement external memory/attention compensations to aid daily functioning and improve safety given usual min A over 2 sessions   ? Baseline 04-26-21   ? Status Achieved   ? Target Date 04/29/21   ?  ? SLP SHORT TERM GOAL #2  ? Title Pt will utilize anomia compensations to aid word finding on functional structured speech tasks given occasional min A over 2 sessions   ? Baseline 04-20-21   ? Status Achieved   ? Target Date 04/29/21   ?  ? SLP SHORT TERM GOAL #3  ? Title Pt will utilize anomia compensations and/or augmentative aids in simple 5-10 minute conversation given occasional min to mod A over 2 sessions   ? Baseline 04-20-21   ? Status Achieved   ? Target Date 04/29/21   ?  ? SLP SHORT TERM GOAL #4  ? Title pt's husband will demonsrate effective cueing for pt to complete VNeST or SFA at home   ? Status Achieved   ? Target Date 04/29/21   ? ?  ?  ? ?  ? ? ? SLP Long Term Goals - 05/17/21 1153   ? ?  ? SLP LONG TERM GOAL #1  ? Title Caregivers will assist with implementation and carryover of memory/attention compensations to aid daily functioning and safety at home given rare min A   ? Status Achieved   ?  ? SLP LONG TERM GOAL #2  ? Title Pt will utilize anomia compensations or augmentative external supports in 15 minute conversation to aid verbal expression given occasional min A x3 sessions   ? Baseline 04-28-21, 05-03-21   ? Status Achieved   ?  ? SLP LONG TERM GOAL #3  ? Title Pt will demonstrate ability to utilize personally relevant augmentative communication aid/system to aid verbal expression as needed in the future given occasional mod A   ? Status Deferred   ?  ? SLP LONG TERM GOAL #4  ? Title Pt husband will demonstrate capability for assisting pt with VNeST and SFA at home, in 2 sessions   ? Time 6   ? Period Weeks   ? Status On-going   ? ?  ?  ? ?  ? ? ? Plan - 05/17/21 1152   ? ? Clinical Impression Statement Erika [name] was referred by neurology due to worsening word finding and memory associated  with dementia (neuropsychologist questioned frontotemporal dementia versus Alzheimer?s). SLP agreed pt was appropriate to decr to once/week. SLP cont to recommend skilled ST intervention to educate and train functional compensations to aid memory/attention and safety awareness, optimize maintenance of current  language skills, as well as support future language skills given expected disease progression. She may return after her assessment at [facility] in May, if ST is recommended.   ? Speech Therapy Frequency 1x /week   husband requested 1x/week  ? Duration 8 weeks   ? Treatment/Interventions Compensatory strategies;Functional tasks;Patient/family education;Cueing hierarchy;Multimodal communcation approach;Language facilitation;Compensatory techniques;Internal/external aids;SLP instruction and feedback   ? Potential to [place]   ? Potential Considerations Medical prognosis   ? ?  ?  ? ?  ? ? ?Patient will benefit from skilled therapeutic intervention in order to improve the following deficits and impairments:   ?Aphasia ? ?Cognitive communication deficit ? ? ? ?Problem List ?Patient Active Problem List  ? Diagnosis Date Noted  ? 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 Cerebrovascular disease   ? IDA (iron deficiency anemia) 12/28/2020  ? Vitamin B 12 deficiency 10/16/2020  ? Osteopenia after menopause   ? GERD (gastroesophageal reflux disease)   ? History of chicken pox   ? Sun-damaged skin 05/23/2020  ? 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  ? Headache 02/11/2018  ? 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 Obstructive sleep apnea 05/23/2017  ? Low vitamin D level 01/09/2016  ? Hyponatremia 08/02/2014  ? Fatigue 08/02/2014  ? Anterior cervical lymphadenopathy 02/08/2013  ? Hypokalemia 06/24/2012  ? Dry mouth 03/13/2012  ? Arthritis 04/02/2011  ? IBS (irritable bowel syndrome) 04/02/2011  ? Overactive bladder 04/02/2011  ? Hypertension   ? Diverticulosis of colon    ? History of fibrocystic disease of breast   ? Chronic rhinitis 01/13/2009  ? Hyperlipidemia, mixed 02/20/2007  ? ? ?[place], [place] ?05/17/2021, 11:54 AM ? ?[place] ?[place]

## 2021-05-24 ENCOUNTER — Ambulatory Visit: Payer: PPO

## 2021-05-24 ENCOUNTER — Other Ambulatory Visit: Payer: Self-pay

## 2021-05-24 DIAGNOSIS — R4701 Aphasia: Secondary | ICD-10-CM | POA: Diagnosis not present

## 2021-05-24 DIAGNOSIS — R41841 Cognitive communication deficit: Secondary | ICD-10-CM

## 2021-05-24 NOTE — Therapy (Signed)
[place] ?[facility] ?[address], [address] ?[place], [place], [address] ?Phone: [phone]   Fax:  [phone] ? ?Speech Language Pathology Treatment ? ?Patient Details  ?Name: Erika [name] ?MRN: [id number] ?Date of Birth: 23-Feb-1946 ?Referring Provider (SLP): [name], MD ? ? ?Encounter Date: 05/24/2021 ? ? End of Session - 05/24/21 1314   ? ? Visit Number 12   ? Number of Visits 17   ? Date for SLP Re-Evaluation 05/27/21   ? Authorization Type HTA   ? SLP Start Time 1104   ? SLP Stop Time  1145   ? SLP Time Calculation (min) 41 min   ? Activity Tolerance Patient tolerated treatment well   ? ?  ?  ? ?  ? ? ?Past Medical History:  ?Diagnosis Date  ? Acute bilateral knee pain 05/15/2016  ? Anterior cervical lymphadenopathy 02/08/2013  ? Arthritis 04/02/2011  ? Bronchitis 04/02/2011  ? 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 Cerebrovascular disease   ? Chest pain 01/19/2014  ? Chronic rhinitis 01/13/2009  ? Diverticulosis of colon   ? colonoscopy 04/14/1999  ? Dry mouth 03/13/2012  ? Fatigue 08/02/2014  ? GERD (gastroesophageal reflux disease)   ? Headache 02/11/2018  ? History of chicken pox   ? as a child  ? History of fibrocystic disease of breast   ? History of measles   ? as a child  ? History of mumps   ? as a child  ? Hyperlipidemia, mixed 02/20/2007  ? Goal LDL < 130 HBP/Pos fm hx  ? Hypertension   ? Hypokalemia 06/24/2012  ? Hyponatremia 08/02/2014  ? IBS (irritable bowel syndrome) 04/02/2011  ? IDA (iron deficiency anemia) 12/28/2020  ? Low vitamin D level 01/09/2016  ? 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  ? Obstructive sleep apnea 05/23/2017  ? uses CPAP nightly  ? Osteopenia after menopause   ? Overactive bladder 04/02/2011  ? Pedal edema 05/20/2020  ? Shoulder pain, bilateral 06/14/2011  ? Thoracic back pain 07/31/2017  ? Vitamin B 12 deficiency 10/16/2020  ? ? ?Past Surgical  History:  ?Procedure Laterality Date  ? BREAST REDUCTION SURGERY Bilateral 1986  ? BUNIONECTOMY Bilateral 1980  ? CATARACT EXTRACTION, BILATERAL    ? COLONOSCOPY  2011  ? HAMMER TOE SURGERY Right 12/03/2013  ? foot  ? MOUTH SURGERY  01/10/2013  ? negative  ? TONSILLECTOMY    ? UPPER GASTROINTESTINAL ENDOSCOPY  2016  ? RK  ? ? ?There were no vitals filed for this visit. ? ? Subjective Assessment - 05/24/21 1121   ? ? Subjective "We've got sick grandbabies at home. Erika [name]'s there."   ? Currently in Pain? Yes   ? Pain Score 4    ? Pain Location Head   ? Pain Orientation Right   ? Pain Descriptors / Indicators Headache   ? Pain Type Acute pain   ? Pain Onset 1 to 4 weeks ago   ? ?  ?  ? ?  ? ? ? ? ? ? ? ? ADULT SLP TREATMENT - 05/24/21 1136   ? ?  ? General Information  ? Behavior/Cognition Alert;Cooperative;Pleasant mood   ?  ? Treatment Provided  ? Treatment provided Cognitive-Linquistic   ?  ? Cognitive-Linquistic Treatment  ? Treatment focused on Aphasia   ? Skilled Treatment SLP and pt reviewed her VNeST and semantic feature analysis which she completed since last session.  Pt req'd min-mod A usually for her "why" phrase on expanded sentneces on VNeST, faded to min A by end of session. SLP reinforced that husband should be available for pt to review VNeST.   ?  ? Assessment / Recommendations / Plan  ? Plan Continue with current plan of care   likely d/c in 1-3 visits  ?  ? Progression Toward Goals  ? Progression toward goals Progressing toward goals   ? ?  ?  ? ?  ? ? ? ? ? SLP Short Term Goals - 05/17/21 1153   ? ?  ? SLP SHORT TERM GOAL #1  ? Title Pt will implement external memory/attention compensations to aid daily functioning and improve safety given usual min A over 2 sessions   ? Baseline 04-26-21   ? Status Achieved   ? Target Date 04/29/21   ?  ? SLP SHORT TERM GOAL #2  ? Title Pt will utilize anomia compensations to aid word finding on functional structured speech tasks given occasional min A over 2  sessions   ? Baseline 04-20-21   ? Status Achieved   ? Target Date 04/29/21   ?  ? SLP SHORT TERM GOAL #3  ? Title Pt will utilize anomia compensations and/or augmentative aids in simple 5-10 minute conversation given occasional min to mod A over 2 sessions   ? Baseline 04-20-21   ? Status Achieved   ? Target Date 04/29/21   ?  ? SLP SHORT TERM GOAL #4  ? Title pt's husband will demonsrate effective cueing for pt to complete VNeST or SFA at home   ? Status Achieved   ? Target Date 04/29/21   ? ?  ?  ? ?  ? ? ? SLP Long Term Goals - 05/24/21 1316   ? ?  ? SLP LONG TERM GOAL #1  ? Title Caregivers will assist with implementation and carryover of memory/attention compensations to aid daily functioning and safety at home given rare min A   ? Status Achieved   ?  ? SLP LONG TERM GOAL #2  ? Title Pt will utilize anomia compensations or augmentative external supports in 15 minute conversation to aid verbal expression given occasional min A x3 sessions   ? Baseline 04-28-21, 05-03-21   ? Status Achieved   ?  ? SLP LONG TERM GOAL #3  ? Title Pt will demonstrate ability to utilize personally relevant augmentative communication aid/system to aid verbal expression as needed in the future given occasional mod A   ? Status Deferred   ?  ? SLP LONG TERM GOAL #4  ? Title Pt husband will demonstrate capability for assisting pt with VNeST and SFA at home, in 2 sessions   ? Time 6   ? Period Weeks   ? Status On-going   ? Target Date 05/27/21   ? ?  ?  ? ?  ? ? ? Plan - 05/24/21 1315   ? ? Clinical Impression Statement Erika [name] was referred by neurology due to worsening word finding and memory associated with dementia (neuropsychologist questioned frontotemporal dementia versus Alzheimer?s). SLP agreed pt was appropriate to decr to once/week. SLP cont to recommend skilled ST intervention to educate and train functional compensations to aid memory/attention and safety awareness, optimize maintenance of current language skills, as well as  support future language skills given expected disease progression. She may return after her assessment at [facility] in May, if ST is recommended.   ? Speech Therapy Frequency  1x /week   husband requested 1x/week  ? Duration 8 weeks   ? Treatment/Interventions Compensatory strategies;Functional tasks;Patient/family education;Cueing hierarchy;Multimodal communcation approach;Language facilitation;Compensatory techniques;Internal/external aids;SLP instruction and feedback   ? Potential to [place]   ? Potential Considerations Medical prognosis   ? ?  ?  ? ?  ? ? ?Patient will benefit from skilled therapeutic intervention in order to improve the following deficits and impairments:   ?Aphasia ? ?Cognitive communication deficit ? ? ? ?Problem List ?Patient Active Problem List  ? Diagnosis Date Noted  ? 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 Cerebrovascular disease   ? IDA (iron deficiency anemia) 12/28/2020  ? Vitamin B 12 deficiency 10/16/2020  ? Osteopenia after menopause   ? GERD (gastroesophageal reflux disease)   ? History of chicken pox   ? Sun-damaged skin 05/23/2020  ? 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  ? Headache 02/11/2018  ? 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 Obstructive sleep apnea 05/23/2017  ? Low vitamin D level 01/09/2016  ? Hyponatremia 08/02/2014  ? Fatigue 08/02/2014  ? Anterior cervical lymphadenopathy 02/08/2013  ? Hypokalemia 06/24/2012  ? Dry mouth 03/13/2012  ? Arthritis 04/02/2011  ? IBS (irritable bowel syndrome) 04/02/2011  ? Overactive bladder 04/02/2011  ? Hypertension   ? Diverticulosis of colon   ? History of fibrocystic disease of breast   ? Chronic rhinitis 01/13/2009  ? Hyperlipidemia, mixed 02/20/2007  ? ? ?[place], [place] ?05/24/2021, 1:17 PM ? ?[place] ?[facility] ?[address], [address] ?[place], [place], [address] ?Phone: [phone]   Fax:  [phone] ? ? ?Name: Erika [name] ?MRN: [id number] ?Date of Birth: 1945/08/03 ? ?

## 2021-05-29 ENCOUNTER — Other Ambulatory Visit: Payer: Self-pay | Admitting: Family Medicine

## 2021-05-30 ENCOUNTER — Ambulatory Visit: Payer: PPO

## 2021-05-30 ENCOUNTER — Other Ambulatory Visit: Payer: Self-pay

## 2021-05-30 DIAGNOSIS — L814 Other melanin hyperpigmentation: Secondary | ICD-10-CM | POA: Diagnosis not present

## 2021-05-30 DIAGNOSIS — R41841 Cognitive communication deficit: Secondary | ICD-10-CM

## 2021-05-30 DIAGNOSIS — R4701 Aphasia: Secondary | ICD-10-CM | POA: Diagnosis not present

## 2021-05-30 DIAGNOSIS — Z789 Other specified health status: Secondary | ICD-10-CM | POA: Diagnosis not present

## 2021-05-30 DIAGNOSIS — L538 Other specified erythematous conditions: Secondary | ICD-10-CM | POA: Diagnosis not present

## 2021-05-30 DIAGNOSIS — L821 Other seborrheic keratosis: Secondary | ICD-10-CM | POA: Diagnosis not present

## 2021-05-30 DIAGNOSIS — R208 Other disturbances of skin sensation: Secondary | ICD-10-CM | POA: Diagnosis not present

## 2021-05-30 DIAGNOSIS — L820 Inflamed seborrheic keratosis: Secondary | ICD-10-CM | POA: Diagnosis not present

## 2021-05-30 NOTE — Therapy (Signed)
[place] ?[facility] ?[address], [address] ?[place], [place], [address] ?Phone: [phone]   Fax:  [phone] ? ?Speech Language Pathology Treatment - Renewal/discharge summary ? ?Patient Details  ?Name: Erika [name] ?MRN: [id number] ?Date of Birth: 07-15-1945 ?Referring Provider (SLP): [name], MD ? ? ?Encounter Date: 05/30/2021 ? ? End of Session - 05/30/21 1224   ? ? Visit Number 13   ? Number of Visits 17   ? Date for SLP Re-Evaluation 05/30/21   ? Authorization Type HTA   ? SLP Start Time [phone]   ? SLP Stop Time  0930   ? SLP Time Calculation (min) 38 min   ? Activity Tolerance Patient tolerated treatment well   ? ?  ?  ? ?  ? ? ?Past Medical History:  ?Diagnosis Date  ? Acute bilateral knee pain 05/15/2016  ? Anterior cervical lymphadenopathy 02/08/2013  ? Arthritis 04/02/2011  ? Bronchitis 04/02/2011  ? 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 Cerebrovascular disease   ? Chest pain 01/19/2014  ? Chronic rhinitis 01/13/2009  ? Diverticulosis of colon   ? colonoscopy 04/14/1999  ? Dry mouth 03/13/2012  ? Fatigue 08/02/2014  ? GERD (gastroesophageal reflux disease)   ? Headache 02/11/2018  ? History of chicken pox   ? as a child  ? History of fibrocystic disease of breast   ? History of measles   ? as a child  ? History of mumps   ? as a child  ? Hyperlipidemia, mixed 02/20/2007  ? Goal LDL < 130 HBP/Pos fm hx  ? Hypertension   ? Hypokalemia 06/24/2012  ? Hyponatremia 08/02/2014  ? IBS (irritable bowel syndrome) 04/02/2011  ? IDA (iron deficiency anemia) 12/28/2020  ? Low vitamin D level 01/09/2016  ? 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  ? Obstructive sleep apnea 05/23/2017  ? uses CPAP nightly  ? Osteopenia after menopause   ? Overactive bladder 04/02/2011  ? Pedal edema 05/20/2020  ? Shoulder pain, bilateral 06/14/2011  ? Thoracic back pain 07/31/2017  ? Vitamin B 12 deficiency 10/16/2020   ? ? ?Past Surgical History:  ?Procedure Laterality Date  ? BREAST REDUCTION SURGERY Bilateral 1986  ? BUNIONECTOMY Bilateral 1980  ? CATARACT EXTRACTION, BILATERAL    ? COLONOSCOPY  2011  ? HAMMER TOE SURGERY Right 12/03/2013  ? foot  ? MOUTH SURGERY  01/10/2013  ? negative  ? TONSILLECTOMY    ? UPPER GASTROINTESTINAL ENDOSCOPY  2016  ? RK  ? ?SPEECH THERAPY RENEWAL-DISCHARGE SUMMARY ? ?Visits from Start of Care: 13 ? ?Current functional level related to goals / functional outcomes: ?See below. Goals will be in-force today and then pt will be d/c'd.  ?  ?Remaining deficits: ?Mild memory deficits compensated for by memory compensations ?  ?Education / Equipment: ?Customer service manager task, Verb network strengthening task, compensations for memory ? ?Patient agrees to discharge. Patient goals were met. Patient is being discharged due to being pleased with the current functional level.. ? ? ? ? ? ?There were no vitals filed for this visit. ? ? Subjective Assessment - 05/30/21 1150   ? ? Subjective Pt enters reporting her family gathering this weekend went well - pt indicates her speech is going very well.   ? Patient is accompained by: Family member   Erika [name] -husbanc  ? Currently in Pain? Yes   ? Pain Score 4    ? Pain Location Head   ?  Pain Onset 1 to 4 weeks ago   ? ?  ?  ? ?  ? ? ? ? ? ? ? ? ADULT SLP TREATMENT - 05/30/21 1151   ? ?  ? General Information  ? Behavior/Cognition Alert;Cooperative;Pleasant mood   ?  ? Treatment Provided  ? Treatment provided Cognitive-Linquistic   ?  ? Cognitive-Linquistic Treatment  ? Treatment focused on Aphasia   ? Skilled Treatment SLP reviewed pt's VNeST and semantic feature analysis completed since last session with pt - 100% success. Pt is utilizing memory compensations WNL/WFL. SLP changed his recommnedation to have Erika [name] perform VNeST with pt as she has had 100% success in last 4 VNeST tasks. Pt nor husband have any questions at this time.   ?  ? Assessment / Recommendations /  Plan  ? Plan Discharge SLP treatment due to (comment)   pt satisfied wiht progress  ?  ? Progression Toward Goals  ? Progression toward goals --   d/c day- see goals  ? ?  ?  ? ?  ? ? ? ? ? SLP Short Term Goals - 05/17/21 1153   ? ?  ? SLP SHORT TERM GOAL #1  ? Title Pt will implement external memory/attention compensations to aid daily functioning and improve safety given usual min A over 2 sessions   ? Baseline 04-26-21   ? Status Achieved   ? Target Date 04/29/21   ?  ? SLP SHORT TERM GOAL #2  ? Title Pt will utilize anomia compensations to aid word finding on functional structured speech tasks given occasional min A over 2 sessions   ? Baseline 04-20-21   ? Status Achieved   ? Target Date 04/29/21   ?  ? SLP SHORT TERM GOAL #3  ? Title Pt will utilize anomia compensations and/or augmentative aids in simple 5-10 minute conversation given occasional min to mod A over 2 sessions   ? Baseline 04-20-21   ? Status Achieved   ? Target Date 04/29/21   ?  ? SLP SHORT TERM GOAL #4  ? Title pt's husband will demonsrate effective cueing for pt to complete VNeST or SFA at home   ? Status Achieved   ? Target Date 04/29/21   ? ?  ?  ? ?  ? ? ? SLP Long Term Goals - 05/30/21 1225   ? ?  ? SLP LONG TERM GOAL #1  ? Title Caregivers will assist with implementation and carryover of memory/attention compensations to aid daily functioning and safety at home given rare min A   ? Status Achieved   ?  ? SLP LONG TERM GOAL #2  ? Title Pt will utilize anomia compensations or augmentative external supports in 15 minute conversation to aid verbal expression given occasional min A x3 sessions   ? Baseline 04-28-21, 05-03-21   ? Status Achieved   ?  ? SLP LONG TERM GOAL #3  ? Title Pt will demonstrate ability to utilize personally relevant augmentative communication aid/system to aid verbal expression as needed in the future given occasional mod A   ? Status Deferred   ?  ? SLP LONG TERM GOAL #4  ? Title Pt husband will demonstrate capability for  assisting pt with VNeST and SFA at home, in 2 sessions   ? Time 6   ? Period Weeks   ? Status Deferred   pt success with VNeST and SFA do not warrant assistance at this time  ? ?  ?  ? ?  ? ? ?  Plan - 05/30/21 1224   ? ? Clinical Impression Statement Ahniyah was referred by neurology due to worsening word finding and memory associated with dementia (neuropsychologist questioned frontotemporal dementia versus Alzheimer?s). Pt and husband are satisfied with pt progress - they agree d/c is appropriate today. She may return after her assessment at [facility] in May, if ST is recommended.   ? Speech Therapy Frequency --   husband requested 1x/week  ? Treatment/Interventions Compensatory strategies;Functional tasks;Patient/family education;Cueing hierarchy;Multimodal communcation approach;Language facilitation;Compensatory techniques;Internal/external aids;SLP instruction and feedback   ? Potential to [place]   ? Potential Considerations Medical prognosis   ? ?  ?  ? ?  ? ? ?Patient will benefit from skilled therapeutic intervention in order to improve the following deficits and impairments:   ?Aphasia ? ?Cognitive communication deficit ? ? ? ?Problem List ?Patient Active Problem List  ? Diagnosis Date Noted  ? 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 Cerebrovascular disease   ? IDA (iron deficiency anemia) 12/28/2020  ? Vitamin B 12 deficiency 10/16/2020  ? Osteopenia after menopause   ? GERD (gastroesophageal reflux disease)   ? History of chicken pox   ? Sun-damaged skin 05/23/2020  ? 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  ? Headache 02/11/2018  ? 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 Obstructive sleep apnea 05/23/2017  ? Low vitamin D level 01/09/2016  ? Hyponatremia 08/02/2014  ? Fatigue 08/02/2014  ? Anterior cervical lymphadenopathy 02/08/2013  ? Hypokalemia 06/24/2012  ? Dry mouth 03/13/2012  ? Arthritis 04/02/2011  ? IBS (irritable bowel syndrome) 04/02/2011  ?  Overactive bladder 04/02/2011  ? Hypertension   ? Diverticulosis of colon   ? History of fibrocystic disease of breast   ? Chronic rhinitis 01/13/2009  ? Hyperlipidemia, mixed 02/20/2007  ? ? ?[facility]

## 2021-05-31 ENCOUNTER — Inpatient Hospital Stay: Payer: PPO | Attending: Hematology & Oncology

## 2021-05-31 ENCOUNTER — Inpatient Hospital Stay: Payer: PPO | Admitting: Family

## 2021-05-31 VITALS — BP 160/59 | HR 53 | Temp 98.4°F | Resp 18 | Ht 65.0 in | Wt 167.0 lb

## 2021-05-31 DIAGNOSIS — D529 Folate deficiency anemia, unspecified: Secondary | ICD-10-CM

## 2021-05-31 DIAGNOSIS — R519 Headache, unspecified: Secondary | ICD-10-CM | POA: Diagnosis not present

## 2021-05-31 DIAGNOSIS — Z7982 Long term (current) use of aspirin: Secondary | ICD-10-CM | POA: Insufficient documentation

## 2021-05-31 DIAGNOSIS — E7211 Homocystinuria: Secondary | ICD-10-CM | POA: Insufficient documentation

## 2021-05-31 DIAGNOSIS — D509 Iron deficiency anemia, unspecified: Secondary | ICD-10-CM | POA: Insufficient documentation

## 2021-05-31 DIAGNOSIS — R7983 Abnormal findings of blood amino-acid level: Secondary | ICD-10-CM | POA: Diagnosis not present

## 2021-05-31 DIAGNOSIS — E538 Deficiency of other specified B group vitamins: Secondary | ICD-10-CM | POA: Insufficient documentation

## 2021-05-31 DIAGNOSIS — Z79899 Other long term (current) drug therapy: Secondary | ICD-10-CM | POA: Diagnosis not present

## 2021-05-31 LAB — CBC WITH DIFFERENTIAL (CANCER CENTER ONLY)
Abs Immature Granulocytes: 0.04 10*3/uL (ref 0.00–0.07)
Basophils Absolute: 0.1 10*3/uL (ref 0.0–0.1)
Basophils Relative: 2 %
Eosinophils Absolute: 0.3 10*3/uL (ref 0.0–0.5)
Eosinophils Relative: 5 %
HCT: 39.9 % (ref 36.0–46.0)
Hemoglobin: 13.9 g/dL (ref 12.0–15.0)
Immature Granulocytes: 1 %
Lymphocytes Relative: 34 %
Lymphs Abs: 2 10*3/uL (ref 0.7–4.0)
MCH: 32.7 pg (ref 26.0–34.0)
MCHC: 34.8 g/dL (ref 30.0–36.0)
MCV: 93.9 fL (ref 80.0–100.0)
Monocytes Absolute: 0.6 10*3/uL (ref 0.1–1.0)
Monocytes Relative: 10 %
Neutro Abs: 2.8 10*3/uL (ref 1.7–7.7)
Neutrophils Relative %: 48 %
Platelet Count: 208 10*3/uL (ref 150–400)
RBC: 4.25 MIL/uL (ref 3.87–5.11)
RDW: 11.8 % (ref 11.5–15.5)
WBC Count: 5.8 10*3/uL (ref 4.0–10.5)
nRBC: 0 % (ref 0.0–0.2)

## 2021-05-31 LAB — FERRITIN: Ferritin: 57 ng/mL (ref 11–307)

## 2021-05-31 LAB — IRON AND IRON BINDING CAPACITY (CC-WL,HP ONLY)
Iron: 105 ug/dL (ref 28–170)
Saturation Ratios: 29 % (ref 10.4–31.8)
TIBC: 361 ug/dL (ref 250–450)
UIBC: 256 ug/dL (ref 148–442)

## 2021-05-31 LAB — RETICULOCYTES
Immature Retic Fract: 10.8 % (ref 2.3–15.9)
RBC.: 4.2 MIL/uL (ref 3.87–5.11)
Retic Count, Absolute: 80.6 10*3/uL (ref 19.0–186.0)
Retic Ct Pct: 1.9 % (ref 0.4–3.1)

## 2021-05-31 NOTE — Progress Notes (Signed)
?Hematology and Oncology Follow Up Visit ? ?Erika [name] ?[id number] ?[birth year] 76 y.o. ?05/31/2021 ? ? ?Principle Diagnosis:  ?Iron deficiency anemia  ?Elevated homocystine level  ?  ?Current Therapy:        ?IV iron as indicated  ?Folic acid 1 mg PO Daily ?  ?Interim History:  Erika [name] is here today with her husband for follow-up. She is doing well but has had some sinus headaches lately. She states that she is just taking a break to rest as needed.  ?No fever, chills, n/v, cough, rash, dizziness, SOB, chest pain, palpitations, abdominal pain or changes in bowel or bladder habits at this time.  ?No blood loss, bruising or petechiae noted.  ?No swelling, tenderness, numbness or tingling in her extremities.  ?No falls or syncope.  ?She has maintained a fairly good appetite and is doing her best to stay well hydrated.  ?She will be starting the memory program with [facility] in May. Her weight is stable at 167 lbs.   ?She stays quite active and enjoys walking and doing yard work.  ? ?ECOG Performance Status: 0 - Asymptomatic ? ?Medications:  ?Allergies as of 05/31/2021   ?No Known Allergies ?  ? ?  ?Medication List  ?  ? ?  ? Accurate as of May 31, 2021  9:47 AM. If you have any questions, ask your nurse or doctor.  ?  ?  ? ?  ? ?amLODipine 10 MG tablet ?Commonly known as: NORVASC ?TAKE 1/2 TABLET BY MOUTH TWICE A DAY ?  ?aspirin EC 81 MG tablet ?Take 1 tablet (81 mg total) by mouth daily. ?  ?calcium carbonate 500 MG chewable tablet ?Commonly known as: TUMS - dosed in mg elemental calcium ?Chew 1 tablet by mouth daily. ?  ?CITRACAL PLUS PO ?Take 1 tablet by mouth daily. ?  ?donepezil 10 MG tablet ?Commonly known as: ARICEPT ?Take 0.5 tablets (5 mg total) by mouth at bedtime. ?  ?famotidine 40 MG tablet ?Commonly known as: PEPCID ?Take 1 tablet (40 mg total) by mouth at bedtime as needed for heartburn or indigestion. ?  ?folic acid 1 MG tablet ?Commonly known as: FOLVITE ?Take 1 tablet (1 mg total) by  mouth daily. ?  ?furosemide 20 MG tablet ?Commonly known as: LASIX ?TAKE 0.5 TABLETS (10 MG TOTAL) BY MOUTH DAILY AS NEEDED FOR EDEMA. ?  ?metoprolol succinate 50 MG 24 hr tablet ?Commonly known as: TOPROL-XL ?TAKE 1 TABLET BY MOUTH TWICE A DAY WITH OR IMMEDIATELY FOLLOWING A MEAL ?  ?rosuvastatin 20 MG tablet ?Commonly known as: CRESTOR ?Take 1 tablet (20 mg total) by mouth daily. ?  ?trospium 20 MG tablet ?Commonly known as: SANCTURA ?Take 1 tablet (20 mg total) by mouth daily. ?  ?UNABLE TO FIND ?COGNITIVE TABLET DAILY ?  ?valsartan 320 MG tablet ?Commonly known as: DIOVAN ?TAKE 1 TABLET BY MOUTH EVERY DAY ?  ? ?  ? ? ?Allergies: No Known Allergies ? ?Past Medical History, Surgical history, Social history, and Family History were reviewed and updated. ? ?Review of Systems: ?All other 10 point review of systems is negative.  ? ?Physical Exam: ? height is '5\' 5"'$  (1.651 m) and weight is 167 lb 0.6 oz (75.8 kg). Her oral temperature is 98.4 ?F (36.9 ?C). Her blood pressure is 160/59 (abnormal) and her pulse is 53 (abnormal). Her respiration is 18 and oxygen saturation is 99%.  ? ?Wt Readings from Last 3 Encounters:  ?05/31/21 167 lb 0.6 oz (75.8 kg)  ?  05/16/21 165 lb 12.8 oz (75.2 kg)  ?03/02/21 165 lb 6.4 oz (75 kg)  ? ? ?Ocular: Sclerae unicteric, pupils equal, round and reactive to light ?Ear-nose-throat: Oropharynx clear, dentition fair ?Lymphatic: No cervical or supraclavicular adenopathy ?Lungs no rales or rhonchi, good excursion bilaterally ?Heart regular rate and rhythm, no murmur appreciated ?Abd soft, nontender, positive bowel sounds ?MSK no focal spinal tenderness, no joint edema ?Neuro: non-focal, well-oriented, appropriate affect ?Breasts: Deferred  ? ?Lab Results  ?Component Value Date  ? WBC 5.8 05/31/2021  ? HGB 13.9 05/31/2021  ? HCT 39.9 05/31/2021  ? MCV 93.9 05/31/2021  ? PLT 208 05/31/2021  ? ?Lab Results  ?Component Value Date  ? FERRITIN 112 03/02/2021  ? IRON 75 03/02/2021  ? TIBC 325  03/02/2021  ? UIBC 250 03/02/2021  ? IRONPCTSAT 23 03/02/2021  ? ?Lab Results  ?Component Value Date  ? RETICCTPCT 1.9 05/31/2021  ? RBC 4.20 05/31/2021  ? ?No results found for: KPAFRELGTCHN, LAMBDASER, KAPLAMBRATIO ?No results found for: IGGSERUM, IGA, IGMSERUM ?No results found for: TOTALPROTELP, ALBUMINELP, A1GS, A2GS, BETS, BETA2SER, GAMS, MSPIKE, SPEI ?  Chemistry   ?   ?Component Value Date/Time  ? NA 140 02/22/2021 1155  ? K 3.6 02/22/2021 1155  ? CL 102 02/22/2021 1155  ? CO2 29 02/22/2021 1155  ? BUN 11 02/22/2021 1155  ? CREATININE 1.02 02/22/2021 1155  ? CREATININE 1.19 (H) 12/28/2020 0846  ? CREATININE 1.23 (H) 11/18/2019 0932  ?    ?Component Value Date/Time  ? CALCIUM 9.8 02/22/2021 1155  ? ALKPHOS 84 02/22/2021 1155  ? AST 17 02/22/2021 1155  ? AST 15 12/28/2020 0846  ? ALT 12 02/22/2021 1155  ? ALT 11 12/28/2020 0846  ? BILITOT 0.8 02/22/2021 1155  ? BILITOT 0.7 12/28/2020 0846  ?  ? ? ? ?Impression and Plan: Erika [name] is a very pleasant 76 yo caucasian female with history of multifactorial anemia including iron deficiency and B 12 deficiency (managed by PCP). She also had a slightly elevated homocystine level. ?Continue Folic acid 1 mg Po daily.  ?Iron studies pending.  ?Follow-up in 6 months.  ? ?Erika Dawson, NP ?3/28/20239:47 AM ? ?

## 2021-06-01 LAB — HOMOCYSTEINE: Homocysteine: 9.3 umol/L (ref 0.0–19.2)

## 2021-06-04 ENCOUNTER — Other Ambulatory Visit: Payer: Self-pay | Admitting: Cardiology

## 2021-06-20 ENCOUNTER — Ambulatory Visit (INDEPENDENT_AMBULATORY_CARE_PROVIDER_SITE_OTHER): Payer: PPO | Admitting: Family Medicine

## 2021-06-20 ENCOUNTER — Ambulatory Visit: Payer: PPO | Admitting: Family Medicine

## 2021-06-20 ENCOUNTER — Encounter: Payer: Self-pay | Admitting: Family Medicine

## 2021-06-20 VITALS — BP 158/58 | HR 50 | Ht 65.0 in | Wt 167.0 lb

## 2021-06-20 DIAGNOSIS — G3109 Other frontotemporal dementia: Secondary | ICD-10-CM

## 2021-06-20 DIAGNOSIS — E782 Mixed hyperlipidemia: Secondary | ICD-10-CM

## 2021-06-20 DIAGNOSIS — F02A0 Dementia in other diseases classified elsewhere, mild, without behavioral disturbance, psychotic disturbance, mood disturbance, and anxiety: Secondary | ICD-10-CM

## 2021-06-20 DIAGNOSIS — I10 Essential (primary) hypertension: Secondary | ICD-10-CM | POA: Diagnosis not present

## 2021-06-20 LAB — LIPID PANEL
Cholesterol: 157 mg/dL (ref 0–200)
HDL: 46.9 mg/dL (ref >39.00)
LDL Cholesterol: 83 mg/dL (ref 0–99)
NonHDL: 110.41
Total CHOL/HDL Ratio: 3
Triglycerides: 138 mg/dL (ref 0.0–149.0)
VLDL: 27.6 mg/dL (ref 0.0–40.0)

## 2021-06-20 LAB — COMPREHENSIVE METABOLIC PANEL
ALT: 15 U/L (ref 0–35)
AST: 16 U/L (ref 0–37)
Albumin: 4.3 g/dL (ref 3.5–5.2)
Alkaline Phosphatase: 83 U/L (ref 39–117)
BUN: 14 mg/dL (ref 6–23)
CO2: 30 mEq/L (ref 19–32)
Calcium: 9.5 mg/dL (ref 8.4–10.5)
Chloride: 103 mEq/L (ref 96–112)
Creatinine, Ser: 1.04 mg/dL (ref 0.40–1.20)
GFR: 52.6 mL/min — ABNORMAL LOW (ref >60.00)
Glucose, Bld: 78 mg/dL (ref 70–99)
Potassium: 4 mEq/L (ref 3.5–5.1)
Sodium: 140 mEq/L (ref 135–145)
Total Bilirubin: 0.9 mg/dL (ref 0.2–1.2)
Total Protein: 7.1 g/dL (ref 6.0–8.3)

## 2021-06-20 LAB — TSH: TSH: 1.8 u[IU]/mL (ref 0.35–5.50)

## 2021-06-20 NOTE — Progress Notes (Signed)
? ?Established Patient Office Visit ? ?Subjective:  ?Patient ID: Erika [name], female    DOB: 07-29-1945  Age: 76 y.o. MRN: [id number] ? ?CC: routine f/u  ? ? ?HPI ?Erika [name] presents for routine f/u. No acute concerns. ? ? ?DEMENTIA ?Seeing specialists for dementia now. Next month goes to [facility] clinic for further evaluation. Reports she is feeling much better since starting Aricept, confusions/delusions have cleared up significantly, but is still having some speech difficulties - saw SLP for 6-8 weeks, and that helped quite a bit, but still off from baseline. ? ?HYPERTENSION: ?- Medications: amlodipine 10 mg daily, metoprolol 50 mg BID, valsartan 320 mg daily ?- Compliance: good ?- Checking BP at home: no ?- Denies any SOB, recurrent headaches, CP, vision changes, LE edema, dizziness, palpitations, or medication side effects. ? ? ? ?Following with hematology/oncology for anemia, iron/b12 deficiency. Labs stable last month.  ? ? ? ? ?Past Medical History:  ?Diagnosis Date  ? Acute bilateral knee pain 05/15/2016  ? Anterior cervical lymphadenopathy 02/08/2013  ? Arthritis 04/02/2011  ? Bronchitis 04/02/2011  ? 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 Cerebrovascular disease   ? Chest pain 01/19/2014  ? Chronic rhinitis 01/13/2009  ? Diverticulosis of colon   ? colonoscopy 04/14/1999  ? Dry mouth 03/13/2012  ? Fatigue 08/02/2014  ? GERD (gastroesophageal reflux disease)   ? Headache 02/11/2018  ? History of chicken pox   ? as a child  ? History of fibrocystic disease of breast   ? History of measles   ? as a child  ? History of mumps   ? as a child  ? Hyperlipidemia, mixed 02/20/2007  ? Goal LDL < 130 HBP/Pos fm hx  ? Hypertension   ? Hypokalemia 06/24/2012  ? Hyponatremia 08/02/2014  ? IBS (irritable bowel syndrome) 04/02/2011  ? IDA (iron deficiency anemia) 12/28/2020  ? Low vitamin D level 01/09/2016  ? 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 Mitral valve  disorder 05/23/2020  ? Obstructive sleep apnea 05/23/2017  ? uses CPAP nightly  ? Osteopenia after menopause   ? Overactive bladder 04/02/2011  ? Pedal edema 05/20/2020  ? Shoulder pain, bilateral 06/14/2011  ? Thoracic back pain 07/31/2017  ? Vitamin B 12 deficiency 10/16/2020  ? ? ?Past Surgical History:  ?Procedure Laterality Date  ? BREAST REDUCTION SURGERY Bilateral 1986  ? BUNIONECTOMY Bilateral 1980  ? CATARACT EXTRACTION, BILATERAL    ? COLONOSCOPY  2011  ? HAMMER TOE SURGERY Right 12/03/2013  ? foot  ? MOUTH SURGERY  01/10/2013  ? negative  ? TONSILLECTOMY    ? UPPER GASTROINTESTINAL ENDOSCOPY  2016  ? RK  ? ? ?Family History  ?Problem Relation Age of Onset  ? Stroke Mother 72  ? Hyperlipidemia Mother   ? Hypertension Mother   ? Heart attack Father 7  ? Hypertension Father   ? Heart disease Father   ? Hypertension Brother   ? Aneurysm Brother   ?     aortic  ? Hyperlipidemia Brother   ? Heart disease Brother   ?     aortic aneurysm rupture  ? Multiple sclerosis Daughter   ? Alcohol abuse Son   ? Schizophrenia Son   ? Bipolar disorder Son   ? Mental illness Son   ?     schizophrenic, bipolar  ? Colon cancer Neg Hx   ? Colon polyps Neg Hx   ? Esophageal cancer Neg Hx   ?  Rectal cancer Neg Hx   ? Stomach cancer Neg Hx   ? Alzheimer's disease Neg Hx   ? ? ?Social History  ? ?Socioeconomic History  ? Marital status: Married  ?  Spouse name: Not on file  ? Number of children: 4  ? Years of education: 30  ? Highest education level: Associate degree: academic program  ?Occupational History  ? 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 Smoking status: Never  ? Smokeless tobacco: Never  ?Vaping Use  ? Vaping Use: Never used  ?Substance and Sexual Activity  ? Alcohol use: No  ? Drug use: No  ? Sexual activity: Not Currently  ?Other Topics Concern  ? Not on file  ?Social History Narrative  ? 4 children 2 are adopted  ?   ? Lives at home with husband, children live closeby   ? Right handed  ?  Caffeine: maybe 1-3 "or more" cups/day  ? ?Social Determinants of Health  ? ?Financial Resource Strain: Not on file  ?Food Insecurity: Not on file  ?Transportation Needs: Not on file  ?Physical Activity: Not on file  ?Stress: Not on file  ?Social Connections: Not on file  ?Intimate Partner Violence: Not on file  ? ? ?Outpatient Medications Prior to Visit  ?Medication Sig Dispense Refill  ? amLODipine (NORVASC) 10 MG tablet TAKE 1/2 TABLET BY MOUTH TWICE A DAY 90 tablet 1  ? aspirin EC 81 MG tablet Take 1 tablet (81 mg total) by mouth daily.    ? calcium carbonate (TUMS - DOSED IN MG ELEMENTAL CALCIUM) 500 MG chewable tablet Chew 1 tablet by mouth daily.    ? donepezil (ARICEPT) 10 MG tablet Take 0.5 tablets (5 mg total) by mouth at bedtime. 15 tablet 3  ? famotidine (PEPCID) 40 MG tablet Take 1 tablet (40 mg total) by mouth at bedtime as needed for heartburn or indigestion. 90 tablet 3  ? folic acid (FOLVITE) 1 MG tablet Take 1 tablet (1 mg total) by mouth daily. 30 tablet 6  ? furosemide (LASIX) 20 MG tablet TAKE 0.5 TABLETS (10 MG TOTAL) BY MOUTH DAILY AS NEEDED FOR EDEMA. 45 tablet 1  ? metoprolol succinate (TOPROL-XL) 50 MG 24 hr tablet TAKE 1 TABLET BY MOUTH TWICE A DAY WITH OR IMMEDIATELY FOLLOWING A MEAL 180 tablet 2  ? Multiple Minerals-Vitamins (CITRACAL PLUS PO) Take 1 tablet by mouth 2 (two) times daily.    ? rosuvastatin (CRESTOR) 20 MG tablet Take 1 tablet (20 mg total) by mouth daily. 30 tablet 0  ? trospium (SANCTURA) 20 MG tablet Take 1 tablet (20 mg total) by mouth daily. 90 tablet 1  ? UNABLE TO FIND COGNITIVE TABLET DAILY    ? valsartan (DIOVAN) 320 MG tablet TAKE 1 TABLET BY MOUTH EVERY DAY 90 tablet 1  ? ?Facility-Administered Medications Prior to Visit  ?Medication Dose Route Frequency Provider Last Rate Last Admin  ? cyanocobalamin ((VITAMIN B-12)) injection 1,000 mcg  1,000 mcg Intramuscular Q30 days Carollee Herter, Kendrick Fries R, DO   1,000 mcg at 10/27/20 1052  ? ? ?No Known  Allergies ? ?ROS ?Review of Systems ?All review of systems negative except what is listed in the HPI ? ?  ?Objective:  ?  ?Physical Exam ?Vitals reviewed.  ?Constitutional:   ?   General: She is not in acute distress. ?   Appearance: Normal appearance. She is not ill-appearing.  ?HENT:  ?   Head: Normocephalic and atraumatic.  ?Cardiovascular:  ?  Rate and Rhythm: Normal rate and regular rhythm.  ?Pulmonary:  ?   Effort: Pulmonary effort is normal.  ?   Breath sounds: Normal breath sounds.  ?Musculoskeletal:  ?   Right lower leg: No edema.  ?   Left lower leg: No edema.  ?Skin: ?   General: Skin is warm and dry.  ?Neurological:  ?   General: No focal deficit present.  ?   Mental Status: She is alert and oriented to person, place, and time.  ?Psychiatric:     ?   Mood and Affect: Mood normal.     ?   Behavior: Behavior normal.     ?   Thought Content: Thought content normal.     ?   Judgment: Judgment normal.  ? ? ?BP (!) 158/58   Pulse (!) 50   Ht '5\' 5"'$  (1.651 m)   Wt 167 lb (75.8 kg)   BMI 27.79 kg/m?  ?Wt Readings from Last 3 Encounters:  ?06/20/21 167 lb (75.8 kg)  ?05/31/21 167 lb 0.6 oz (75.8 kg)  ?05/16/21 165 lb 12.8 oz (75.2 kg)  ? ? ? ?Health Maintenance Due  ?Topic Date Due  ? Pneumonia Vaccine 57+ Years old (44) 12/05/2015  ? TETANUS/TDAP  03/06/2020  ? ? ?There are no preventive care reminders to display for this patient. ? ?Lab Results  ?Component Value Date  ? TSH 1.80 06/20/2021  ? ?Lab Results  ?Component Value Date  ? WBC 5.8 05/31/2021  ? HGB 13.9 05/31/2021  ? HCT 39.9 05/31/2021  ? MCV 93.9 05/31/2021  ? PLT 208 05/31/2021  ? ?Lab Results  ?Component Value Date  ? NA 140 06/20/2021  ? K 4.0 06/20/2021  ? CO2 30 06/20/2021  ? GLUCOSE 78 06/20/2021  ? BUN 14 06/20/2021  ? CREATININE 1.04 06/20/2021  ? BILITOT 0.9 06/20/2021  ? ALKPHOS 83 06/20/2021  ? AST 16 06/20/2021  ? ALT 15 06/20/2021  ? PROT 7.1 06/20/2021  ? ALBUMIN 4.3 06/20/2021  ? CALCIUM 9.5 06/20/2021  ? ANIONGAP 9 12/28/2020  ? GFR  52.60 (L) 06/20/2021  ? ?Lab Results  ?Component Value Date  ? CHOL 157 06/20/2021  ? ?Lab Results  ?Component Value Date  ? HDL 46.90 06/20/2021  ? ?Lab Results  ?Component Value Date  ? Pala 83 06/20/2021  ? ?Lab Results  ?Compon

## 2021-06-20 NOTE — Patient Instructions (Signed)
Blood pressure is not at goal for age and co-morbidities.  I recommend continue current meds and monitor at home.   ?- BP goal <130/80 ?- monitor and log blood pressures at home ?- check around the same time each day in a relaxed setting ?- Limit salt to <2000 mg/day ?- Follow DASH eating plan (heart healthy diet) ?- limit alcohol to 2 standard drinks per day for men and 1 per day for women ?- avoid tobacco products ?- get at least 2 hours of regular aerobic exercise weekly ?Patient aware of signs/symptoms requiring further/urgent evaluation. ?Labs updated today. ?Nurse visit in 2 weeks to recheck BP, bring home log  ? ?

## 2021-06-21 ENCOUNTER — Other Ambulatory Visit: Payer: Self-pay | Admitting: Pharmacist

## 2021-06-22 ENCOUNTER — Encounter: Payer: Self-pay | Admitting: *Deleted

## 2021-06-22 ENCOUNTER — Other Ambulatory Visit: Payer: Self-pay | Admitting: Family

## 2021-06-22 DIAGNOSIS — D529 Folate deficiency anemia, unspecified: Secondary | ICD-10-CM

## 2021-06-22 DIAGNOSIS — E7211 Homocystinuria: Secondary | ICD-10-CM

## 2021-06-27 ENCOUNTER — Encounter: Payer: Self-pay | Admitting: Adult Health

## 2021-06-27 ENCOUNTER — Ambulatory Visit: Payer: PPO | Admitting: Adult Health

## 2021-06-27 VITALS — BP 170/70 | HR 52 | Ht 66.0 in | Wt 168.0 lb

## 2021-06-27 DIAGNOSIS — F0390 Unspecified dementia without behavioral disturbance: Secondary | ICD-10-CM | POA: Diagnosis not present

## 2021-06-27 NOTE — Progress Notes (Signed)
?[facility] ?[address] ?[place]. [place] [address] ?[phone] ? ?     OFFICE FOLLOW UP NOTE ? ?Ms. Erika [name] ?Date of Birth:  09-20-1945 ?Medical Record Number:  [id number]  ? ? ?Primary neurologist: Dr. Rexene Alberts ?Reason for visit: Memory loss ? ? ? ?SUBJECTIVE: ? ? ?CHIEF COMPLAINT:  ?Chief Complaint  ?Patient presents with  ? Follow-up  ?  RM 2 with spouse dwight ?Pt is well and stable, speech has improved, memory is stable.   ? ? ?HPI:  ? ?Update 06/27/2021 JM: 76 year old female who returns for follow-up visit for memory loss after prior visit with Dr. Rexene Alberts on 1/23.  She is accompanied by her husband.  Completed PET scan 04/2021 which did not show any classic findings for typical Alzheimer's dementia or frontotemporal dementia, per Dr. Rexene Alberts possible mixed form of dementia including vascular and underlying vascular risk factors.  A referral was made to memory disorders clinic at [facility], has initial evaluation next month. ? ?Reports cognition stable since prior visit. Does use compensation strategies. Keeping busy in different activities and learning new things.  She remains very active and helps watch her grandchildren during the day along with her husband.  Currently on Aricept 5 mg nightly tolerating without side effects. MMSE today 25/30 (prior 18/30). Believes aricept has provided significant improvement especially with confusions and delusions.  Still having some speech difficulties, completed speech therapy with noted improvement but still not quite at baseline. ? ?Does c/o mild frontal headaches over the past few months.  Denies issues with headaches in the past (although previously seen by Dr. Rexene Alberts for episodic headaches). She does have seasonal allergies and has been having some congestion. Not currently on any allergy medication. Headaches mild and are not debilitating, no photophobia, phonophobia or N/V. No visual changes associated or any other neurological symptoms.  Has been taking tylenol to help with headaches with benefit.  ? ?Continues to follow with pulmonology for OSA, reports CPAP compliance.  Continues to follow with oncology for iron deficiency anemia.  No further concerns at this time ? ? ? ? ? ? ?History copied from Dr. Guadelupe Sabin prior [place] note for reference purposes only ?Erika [name] is a 76 year old right-handed woman with an underlying medical history of allergies, arthritis, reflux disease, hypertension, hyperlipidemia, irritable bowel syndrome, sleep apnea, mildly overweight state, and vitamin B12 deficiency, who presents for follow-up consultation of her memory loss.  The patient is accompanied by her husband today; her daughter is on speaker phone.  I saw her on 09/30/2020 at the request of her primary care physician, at which time she reported a several month history of short-term memory issues, including forgetfulness, losing train of thought, word finding difficulty.  Her MMSE was 18 at the time.  She had recently been found to have B12 deficiency.  She was advised to proceed with B12 supplementation and also proceed with neuropsychological evaluation for in-depth cognitive testing. ?She was advised to proceed with a brain MRI.  She had a brain MRI without contrast on 10/04/2020 and I reviewed the results: IMPRESSION: ?1. No acute intracranial abnormality. ?2. Moderate chronic microvascular ischemic changes of the white ?matter. ?  ?She was notified of her test results.  ?  ?She had neuropsychological Evaluation and testing with Dr. Hazle Coca on 02/21/21 and a subsequent FU appt on 03/01/21. I reviewed the results and recommendations:  ?  ?<< Clinical Impression(s): ?Erika [name] pattern of performance is suggestive of severe impairment surrounding executive functioning (  including safety and judgment), receptive and expressive language, and nearly all aspects of verbal learning and memory. Relative weaknesses were also exhibited across processing speed,  complex attention (i.e., working memory), visuospatial abilities, and aspects of visual memory. Performance was appropriate across basic attention. Regarding ADLs, Erika [name]'s husband manages all medication, financial, and bill paying responsibilities. There were reports of Erika [name] overmedicating herself prior to him taking the former over. While she described herself as a good driver, her husband expressed concerns surrounding her getting lost. 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 ?  ?The etiology for ongoing dysfunction is difficult to discern and likely multifactorial in nature. Based upon testing results, prominent and severe impairment surrounding executive functioning and receptive/expressive language certainly raises the concern for the presence of frontotemporal dementia. While she does not exhibit disinhibited features of the behavioral variant of this disease process, language dysfunction certainly raises concerns for a language variant (i.e., primary progressive aphasia presentation). Of common language variant subtypes, Erika [name] does not demonstrate non-fluent, halting speech, nor does she exhibit strong evidence for diminished semantic knowledge, making non-fluent primary progressive aphasia and semantic dementia presentations unlikely. 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 While her age is somewhat advanced for onset of this condition, it is unclear how long these symptoms have been developing.  ?  ?Given verbal memory impairment, there are additional concerns surrounding Alzheimer's disease. However, Erika [name] was able to recognize previously learned story  and figure based information reasonably well after a delay. This does not suggest a consistent memory storage deficit, which would make this condition somewhat less likely. It is important to highlight that logopenic aphasia presentations often share the same neuropathology as Alzheimer's disease and that these two conditions can have notably overlapping symptoms as a result. ?  ?Outside of these causes, there is likely a vascular contribution to her overall presentation given recent neuroimaging suggesting stable moderate to severe small vessel ischemic disease. However, current deficits are far beyond what would be expected from these cerebrovascular changes alone. She does not exhibit behavioral features of Lewy body dementia or other more rare parkinsonian conditions at the present time. Continued medical monitoring will be important moving forward.  ?  ?Recommendations: ?Erika [name] should discuss medications aimed to address memory loss and overall cognitive decline with Dr. Rexene Alberts. It is important to highlight that this medication has been shown to slow functional decline in some individuals. There is no current treatment which can stop or reverse cognitive decline when caused by a neurodegenerative illness.  ?  ?Additional neuroimaging in the form of a FDG-PET scan would likely be useful in providing further diagnostic clarity regarding concerns versus dementia due to Alzheimer's disease or frontotemporal dementia.  ?  ?Admittedly, performance across neurocognitive testing is not a strong predictor of an individual's safety operating a motor vehicle. However, given the extent of ongoing impairment, I would recommend that she abstain from driving pending a formal driving evaluation. Should her family wish to pursue a formalized driving evaluation, they could reach out to the following agencies: ?[employer] in [place]: [phone] ?[facility]: [phone] ?[place]: [phone] ?[facility]: [phone] or [phone] ?  ?Should there be further progression of current deficits over time, Erika [name]  is unlikely to regain any independent living skills lost

## 2021-06-27 NOTE — Patient Instructions (Addendum)
Your Plan: ? ?Continue Aricept 5 mg nightly ? ?Ensure evaluation with [facility] memory disorders clinic for further evaluation ? ?Continue to follow with pulmonology for sleep apnea management and ongoing nightly usage  ? ?Try an allergy medication such as Zyrtec or Xyzal to help with allergies. Can also use flonase nasal spray twice daily. If your headaches persist after allergy season, we can try to hold the Aricept to see if this helps with the headaches  ? ? ? ? ? ? ?Thank you for coming to see [place] at [facility]. I hope we have been able to provide you high quality care today. ? ?You may receive a patient satisfaction survey over the next few weeks. We would appreciate your feedback and comments so that we may continue to improve ourselves and the health of our patients. ? ?

## 2021-06-28 ENCOUNTER — Ambulatory Visit: Payer: PPO | Admitting: Adult Health

## 2021-06-30 ENCOUNTER — Other Ambulatory Visit: Payer: Self-pay | Admitting: Cardiology

## 2021-07-05 ENCOUNTER — Ambulatory Visit (INDEPENDENT_AMBULATORY_CARE_PROVIDER_SITE_OTHER): Payer: PPO

## 2021-07-05 DIAGNOSIS — I10 Essential (primary) hypertension: Secondary | ICD-10-CM | POA: Diagnosis not present

## 2021-07-05 NOTE — Progress Notes (Signed)
06/27/21 1231  ?BP: (!) 170/70  ?Pulse: (!) 52  ? ? ?Pt here for Blood pressure check per Ivin Booty on 06/27/21 BP was 170/70  Pulse:52 ? ?Pt currently takes:Medications: amlodipine 10 mg daily, metoprolol 50 mg BID, valsartan 320 mg daily ? ? ?Pt reports compliance with medication. ? ?BP today @ =138/68 ?HR =52 ? ?Pt advised per : No changes and wants to see Pt in 8 weeks for follow up, keep checking BP and HR  ?

## 2021-07-06 DIAGNOSIS — L820 Inflamed seborrheic keratosis: Secondary | ICD-10-CM | POA: Diagnosis not present

## 2021-07-06 DIAGNOSIS — L538 Other specified erythematous conditions: Secondary | ICD-10-CM | POA: Diagnosis not present

## 2021-07-06 DIAGNOSIS — Z789 Other specified health status: Secondary | ICD-10-CM | POA: Diagnosis not present

## 2021-07-09 ENCOUNTER — Other Ambulatory Visit: Payer: Self-pay | Admitting: Cardiology

## 2021-07-10 ENCOUNTER — Other Ambulatory Visit: Payer: Self-pay | Admitting: Neurology

## 2021-07-10 DIAGNOSIS — F0390 Unspecified dementia without behavioral disturbance: Secondary | ICD-10-CM

## 2021-07-11 NOTE — Telephone Encounter (Signed)
Rosuvastatin 20 mg # 15 only with message for pharmacy to contact patient pcp for future refills.  ? ?

## 2021-07-19 DIAGNOSIS — Z79899 Other long term (current) drug therapy: Secondary | ICD-10-CM | POA: Diagnosis not present

## 2021-07-19 DIAGNOSIS — F03A0 Unspecified dementia, mild, without behavioral disturbance, psychotic disturbance, mood disturbance, and anxiety: Secondary | ICD-10-CM | POA: Diagnosis not present

## 2021-07-19 DIAGNOSIS — F02A2 Dementia in other diseases classified elsewhere, mild, with psychotic disturbance: Secondary | ICD-10-CM | POA: Diagnosis not present

## 2021-07-19 DIAGNOSIS — I10 Essential (primary) hypertension: Secondary | ICD-10-CM | POA: Diagnosis not present

## 2021-07-19 DIAGNOSIS — G4733 Obstructive sleep apnea (adult) (pediatric): Secondary | ICD-10-CM | POA: Diagnosis not present

## 2021-07-19 DIAGNOSIS — E538 Deficiency of other specified B group vitamins: Secondary | ICD-10-CM | POA: Diagnosis not present

## 2021-07-24 ENCOUNTER — Other Ambulatory Visit: Payer: Self-pay | Admitting: Family Medicine

## 2021-07-28 ENCOUNTER — Other Ambulatory Visit: Payer: Self-pay | Admitting: Family Medicine

## 2021-08-29 NOTE — Progress Notes (Signed)
Subjective:    Patient ID: Erika [name], female    DOB: October 28, 1945, 76 y.o.   MRN: [id number]  Chief Complaint  Patient presents with   Follow-up    HPI Patient is in today for a follow up on chronic medical concerns. No recent febrile illness or acute hospitalizations. She is accompanied by her husband and she notes she is doing better and is more settled. They have good family support and she is happy with her care at [facility]. She is eating well and managing her ADLs well. Denies CP/palp/SOB/HA/congestion/fevers/GI or GU c/o. 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   Cerebrovascular disease    Chest pain 01/19/2014   Chronic rhinitis 01/13/2009   Diverticulosis of colon    colonoscopy 04/14/1999   Dry mouth 03/13/2012   Fatigue 08/02/2014   GERD (gastroesophageal reflux disease)    Headache 02/11/2018   History of chicken pox    as a child   History of fibrocystic disease of breast    History of measles    as a child   History of mumps    as a child   Hyperlipidemia, mixed 02/20/2007   Goal LDL < 130 HBP/Pos fm hx   Hypertension    Hypokalemia 06/24/2012   Hyponatremia 08/02/2014   IBS (irritable bowel syndrome) 04/02/2011   IDA (iron deficiency anemia) 12/28/2020   Low vitamin D level 01/09/2016   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   Obstructive sleep apnea 05/23/2017   uses CPAP nightly   Osteopenia after menopause    Overactive bladder 04/02/2011   Pedal edema 05/20/2020   Shoulder pain, bilateral 06/14/2011   Thoracic back pain 07/31/2017   Vitamin B 12 deficiency 10/16/2020    Past Surgical History:  Procedure Laterality Date   BREAST REDUCTION SURGERY Bilateral  1986   BUNIONECTOMY Bilateral 1980   CATARACT EXTRACTION, BILATERAL     COLONOSCOPY  2011   HAMMER TOE SURGERY Right 12/03/2013   foot   MOUTH SURGERY  01/10/2013   negative   TONSILLECTOMY     UPPER GASTROINTESTINAL ENDOSCOPY  2016   RK    Family History  Problem Relation Age of Onset   Stroke Mother 63   Hyperlipidemia Mother    Hypertension Mother    Heart attack Father 33   Hypertension Father    Heart disease Father    Hypertension Brother    Aneurysm Brother        aortic   Hyperlipidemia Brother    Heart disease Brother        aortic aneurysm rupture   Multiple sclerosis Daughter    Alcohol abuse Son    Schizophrenia Son    Bipolar disorder Son    Mental illness Son        schizophrenic, bipolar   Colon cancer Neg Hx    Colon polyps Neg Hx    Esophageal cancer Neg Hx    Rectal cancer Neg Hx    Stomach cancer Neg Hx    Alzheimer's disease Neg Hx     Social History   Socioeconomic History   Marital status: Married    Spouse name: Not on file   Number of children: 4   Years of education:  14   Highest education level: Associate degree: academic program  Occupational History   Occupation: Retired    Comment: Arboriculturist  Tobacco Use   Smoking status: Never   Smokeless tobacco: Never  Vaping Use   Vaping Use: Never used  Substance and Sexual Activity   Alcohol use: No   Drug use: No   Sexual activity: Not Currently  Other Topics Concern   Not on file  Social History Narrative   4 children 2 are adopted      Lives at home with husband, children live closeby    Right handed   Caffeine: maybe 1-3 "or more" cups/day   Social Determinants of Health   Financial Resource Strain: Not on file  Food Insecurity: Not on file  Transportation Needs: Not on file  Physical Activity: Not on file  Stress: Not on file  Social Connections: Not on file  Intimate Partner Violence: Not on file    Outpatient Medications Prior to Visit  Medication Sig  Dispense Refill   amLODipine (NORVASC) 10 MG tablet TAKE 1/2 TABLET BY MOUTH TWICE A DAY 90 tablet 1   aspirin EC 81 MG tablet Take 1 tablet (81 mg total) by mouth daily.     donepezil (ARICEPT) 10 MG tablet TAKE 0.5 TABLETS BY MOUTH AT BEDTIME. 45 tablet 1   folic acid (FOLVITE) 1 MG tablet TAKE 1 TABLET BY MOUTH EVERY DAY 90 tablet 2   furosemide (LASIX) 20 MG tablet TAKE 0.5 TABLETS (10 MG TOTAL) BY MOUTH DAILY AS NEEDED FOR EDEMA. 45 tablet 1   metoprolol succinate (TOPROL-XL) 50 MG 24 hr tablet TAKE 1 TABLET BY MOUTH TWICE A DAY WITH OR IMMEDIATELY FOLLOWING A MEAL 180 tablet 2   Multiple Minerals-Vitamins (CITRACAL PLUS PO) Take 1 tablet by mouth 2 (two) times daily.     rosuvastatin (CRESTOR) 20 MG tablet Take 1 tablet (20 mg total) by mouth daily. 30 tablet 3   trospium (SANCTURA) 20 MG tablet Take 1 tablet (20 mg total) by mouth daily. 90 tablet 1   UNABLE TO FIND COGNITIVE TABLET DAILY     valsartan (DIOVAN) 320 MG tablet TAKE 1 TABLET BY MOUTH EVERY DAY 90 tablet 1   calcium carbonate (TUMS - DOSED IN MG ELEMENTAL CALCIUM) 500 MG chewable tablet Chew 1 tablet by mouth daily.     famotidine (PEPCID) 40 MG tablet Take 1 tablet (40 mg total) by mouth at bedtime as needed for heartburn or indigestion. 90 tablet 3   Facility-Administered Medications Prior to Visit  Medication Dose Route Frequency Provider Last Rate Last Admin   cyanocobalamin ((VITAMIN B-12)) injection 1,000 mcg  1,000 mcg Intramuscular Q30 days Carollee Herter, Kendrick Fries R, DO   1,000 mcg at 10/27/20 1052    No Known Allergies  Review of Systems  Constitutional:  Positive for malaise/fatigue. Negative for chills and fever.  HENT:  Negative for congestion and hearing loss.   Eyes:  Negative for blurred vision and discharge.  Respiratory:  Negative for cough, sputum production and shortness of breath.   Cardiovascular:  Negative for chest pain, palpitations and leg swelling.  Gastrointestinal:  Negative for abdominal pain,  blood in stool, constipation, diarrhea, heartburn, nausea and vomiting.  Genitourinary:  Negative for dysuria, frequency, hematuria and urgency.  Musculoskeletal:  Negative for back pain, falls and myalgias.  Skin:  Negative for rash.  Neurological:  Negative for dizziness, sensory change, loss of consciousness, weakness and headaches.  Endo/Heme/Allergies:  Negative for environmental allergies.  Does not bruise/bleed easily.  Psychiatric/Behavioral:  Positive for memory loss. Negative for depression and suicidal ideas. The patient is nervous/anxious. The patient does not have insomnia.        Objective:    Physical Exam Constitutional:      General: She is not in acute distress.    Appearance: She is well-developed.  HENT:     Head: Normocephalic and atraumatic.  Eyes:     Conjunctiva/sclera: Conjunctivae normal.  Neck:     Thyroid: No thyromegaly.  Cardiovascular:     Rate and Rhythm: Normal rate and regular rhythm.     Heart sounds: Normal heart sounds. No murmur heard. Pulmonary:     Effort: Pulmonary effort is normal. No respiratory distress.     Breath sounds: Normal breath sounds.  Abdominal:     General: Bowel sounds are normal. There is no distension.     Palpations: Abdomen is soft. There is no mass.     Tenderness: There is no abdominal tenderness.  Musculoskeletal:     Cervical back: Neck supple.  Lymphadenopathy:     Cervical: No cervical adenopathy.  Skin:    General: Skin is warm and dry.  Neurological:     Mental Status: She is alert and oriented to person, place, and time.  Psychiatric:        Behavior: Behavior normal.     BP 132/78 (BP Location: Left Arm, Patient Position: Sitting, Cuff Size: Normal)   Pulse (!) 51   Resp 20   Ht '5\' 6"'$  (1.676 m)   Wt 162 lb 3.2 oz (73.6 kg)   SpO2 98%   BMI 26.18 kg/m  Wt Readings from Last 3 Encounters:  08/30/21 162 lb 3.2 oz (73.6 kg)  06/27/21 168 lb (76.2 kg)  06/20/21 167 lb (75.8 kg)    Diabetic Foot  Exam - Simple   No data filed    Lab Results  Component Value Date   WBC 5.8 05/31/2021   HGB 13.9 05/31/2021   HCT 39.9 05/31/2021   PLT 208 05/31/2021   GLUCOSE 78 06/20/2021   CHOL 157 06/20/2021   TRIG 138.0 06/20/2021   HDL 46.90 06/20/2021   LDLDIRECT 163.0 07/24/2014   LDLCALC 83 06/20/2021   ALT 15 06/20/2021   AST 16 06/20/2021   NA 140 06/20/2021   K 4.0 06/20/2021   CL 103 06/20/2021   CREATININE 1.04 06/20/2021   BUN 14 06/20/2021   CO2 30 06/20/2021   TSH 1.80 06/20/2021   HGBA1C 5.4 02/22/2021    Lab Results  Component Value Date   TSH 1.80 06/20/2021   Lab Results  Component Value Date   WBC 5.8 05/31/2021   HGB 13.9 05/31/2021   HCT 39.9 05/31/2021   MCV 93.9 05/31/2021   PLT 208 05/31/2021   Lab Results  Component Value Date   NA 140 06/20/2021   K 4.0 06/20/2021   CO2 30 06/20/2021   GLUCOSE 78 06/20/2021   BUN 14 06/20/2021   CREATININE 1.04 06/20/2021   BILITOT 0.9 06/20/2021   ALKPHOS 83 06/20/2021   AST 16 06/20/2021   ALT 15 06/20/2021   PROT 7.1 06/20/2021   ALBUMIN 4.3 06/20/2021   CALCIUM 9.5 06/20/2021   ANIONGAP 9 12/28/2020   GFR 52.60 (L) 06/20/2021   Lab Results  Component Value Date   CHOL 157 06/20/2021   Lab Results  Component Value Date   HDL 46.90 06/20/2021   Lab Results  Component Value Date  [place] 83 06/20/2021   Lab Results  Component Value Date   TRIG 138.0 06/20/2021   Lab Results  Component Value Date   CHOLHDL 3 06/20/2021   Lab Results  Component Value Date   HGBA1C 5.4 02/22/2021       Assessment & Plan:      Problem List Items Addressed This Visit     Hyperlipidemia, mixed - Primary    Tolerating statin, encouraged heart healthy diet, avoid trans fats, minimize simple carbs and saturated fats. Increase exercise as tolerated      Hypertension    Well controlled, no changes to meds. Encouraged heart healthy diet such as the DASH diet and exercise as tolerated.       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 Will order repeat MRI. She and her family have become more comfortable with the diagnosis and she is much more settled and managing well.       Relevant Orders   MR Brain Wo Contrast    I have discontinued Brigetta H. Supple's calcium carbonate and famotidine. I am also having her maintain her Multiple Minerals-Vitamins (CITRACAL PLUS PO), aspirin EC, UNABLE TO FIND, furosemide, trospium, amLODipine, valsartan, folic acid, donepezil, metoprolol succinate, and rosuvastatin. We will continue to administer cyanocobalamin.  No orders of the defined types were placed in this encounter.

## 2021-08-30 ENCOUNTER — Ambulatory Visit (INDEPENDENT_AMBULATORY_CARE_PROVIDER_SITE_OTHER): Payer: PPO | Admitting: Family Medicine

## 2021-08-30 ENCOUNTER — Encounter: Payer: Self-pay | Admitting: Family Medicine

## 2021-08-30 VITALS — BP 132/78 | HR 51 | Resp 20 | Ht 66.0 in | Wt 162.2 lb

## 2021-08-30 DIAGNOSIS — F02A0 Dementia in other diseases classified elsewhere, mild, without behavioral disturbance, psychotic disturbance, mood disturbance, and anxiety: Secondary | ICD-10-CM

## 2021-08-30 DIAGNOSIS — E538 Deficiency of other specified B group vitamins: Secondary | ICD-10-CM | POA: Diagnosis not present

## 2021-08-30 DIAGNOSIS — G3109 Other frontotemporal dementia: Secondary | ICD-10-CM | POA: Diagnosis not present

## 2021-08-30 DIAGNOSIS — D509 Iron deficiency anemia, unspecified: Secondary | ICD-10-CM

## 2021-08-30 DIAGNOSIS — R7989 Other specified abnormal findings of blood chemistry: Secondary | ICD-10-CM | POA: Diagnosis not present

## 2021-08-30 DIAGNOSIS — I10 Essential (primary) hypertension: Secondary | ICD-10-CM

## 2021-08-30 DIAGNOSIS — E782 Mixed hyperlipidemia: Secondary | ICD-10-CM

## 2021-08-31 NOTE — Assessment & Plan Note (Signed)
Well controlled, no changes to meds. Encouraged heart healthy diet such as the DASH diet and exercise as tolerated.  °

## 2021-08-31 NOTE — Assessment & Plan Note (Signed)
Supplement and monitor 

## 2021-08-31 NOTE — Assessment & Plan Note (Signed)
Se is now following with neurology at [facility] and they have recommended she have repeat brain imaging but they did not order. Will order repeat MRI. She and her family have become more comfortable with the diagnosis and she is much more settled and managing well.

## 2021-08-31 NOTE — Assessment & Plan Note (Signed)
Tolerating statin, encouraged heart healthy diet, avoid trans fats, minimize simple carbs and saturated fats. Increase exercise as tolerated 

## 2021-09-02 ENCOUNTER — Other Ambulatory Visit: Payer: Self-pay | Admitting: Family Medicine

## 2021-09-03 ENCOUNTER — Other Ambulatory Visit: Payer: Self-pay | Admitting: Family Medicine

## 2021-09-08 ENCOUNTER — Encounter: Payer: Self-pay | Admitting: Family

## 2021-09-09 ENCOUNTER — Ambulatory Visit (HOSPITAL_BASED_OUTPATIENT_CLINIC_OR_DEPARTMENT_OTHER)
Admission: RE | Admit: 2021-09-09 | Discharge: 2021-09-09 | Disposition: A | Discharge status: Home / Self Care | Payer: PPO | Source: Ambulatory Visit | Attending: Family Medicine | Admitting: Family Medicine

## 2021-09-09 DIAGNOSIS — F0390 Unspecified dementia without behavioral disturbance: Secondary | ICD-10-CM | POA: Diagnosis not present

## 2021-09-09 DIAGNOSIS — G3109 Other frontotemporal dementia: Secondary | ICD-10-CM | POA: Diagnosis not present

## 2021-09-09 DIAGNOSIS — F02A0 Dementia in other diseases classified elsewhere, mild, without behavioral disturbance, psychotic disturbance, mood disturbance, and anxiety: Secondary | ICD-10-CM | POA: Insufficient documentation

## 2021-09-09 DIAGNOSIS — I6782 Cerebral ischemia: Secondary | ICD-10-CM | POA: Diagnosis not present

## 2021-10-25 ENCOUNTER — Telehealth: Payer: Self-pay | Admitting: Family Medicine

## 2021-10-25 ENCOUNTER — Telehealth: Payer: Self-pay

## 2021-10-25 NOTE — Telephone Encounter (Signed)
Done

## 2021-10-25 NOTE — Telephone Encounter (Signed)
Pt called wanting a refill on the following medication and also wanted clarification for the pharmacy whether it was 1 pill/day or 2 pills/day:  Medication:   furosemide (LASIX) 20 MG tablet [[id number]]  Has the patient contacted their pharmacy? No. (If no, request that the patient contact the pharmacy for the refill.) (If yes, when and what did the pharmacy advise?)  Preferred Pharmacy (with phone number or street name):   [id number] [place] [id number] Phone:  [phone] Fax:  [phone]  Agent: Please be advised that RX refills may take up to 3 business days. We ask that you follow-up with your pharmacy.

## 2021-10-25 NOTE — Telephone Encounter (Signed)
Called pt to discuss medication question

## 2021-11-11 ENCOUNTER — Other Ambulatory Visit: Payer: Self-pay | Admitting: Adult Health

## 2021-11-11 DIAGNOSIS — F0390 Unspecified dementia without behavioral disturbance: Secondary | ICD-10-CM

## 2021-11-13 ENCOUNTER — Other Ambulatory Visit: Payer: Self-pay | Admitting: Family Medicine

## 2021-11-13 DIAGNOSIS — E871 Hypo-osmolality and hyponatremia: Secondary | ICD-10-CM

## 2021-11-18 DIAGNOSIS — G4733 Obstructive sleep apnea (adult) (pediatric): Secondary | ICD-10-CM | POA: Diagnosis not present

## 2021-11-21 ENCOUNTER — Encounter: Payer: Self-pay | Admitting: Family

## 2021-11-24 ENCOUNTER — Other Ambulatory Visit: Payer: Self-pay | Admitting: Family Medicine

## 2021-11-28 ENCOUNTER — Other Ambulatory Visit: Payer: Self-pay

## 2021-11-28 ENCOUNTER — Inpatient Hospital Stay: Payer: PPO | Attending: Family

## 2021-11-28 ENCOUNTER — Inpatient Hospital Stay (HOSPITAL_BASED_OUTPATIENT_CLINIC_OR_DEPARTMENT_OTHER): Payer: PPO | Admitting: Family

## 2021-11-28 ENCOUNTER — Encounter: Payer: Self-pay | Admitting: Family

## 2021-11-28 VITALS — BP 159/48 | HR 50 | Temp 98.3°F | Resp 18 | Ht 66.0 in | Wt 151.9 lb

## 2021-11-28 DIAGNOSIS — E7211 Homocystinuria: Secondary | ICD-10-CM

## 2021-11-28 DIAGNOSIS — Z7985 Long-term (current) use of injectable non-insulin antidiabetic drugs: Secondary | ICD-10-CM | POA: Insufficient documentation

## 2021-11-28 DIAGNOSIS — D509 Iron deficiency anemia, unspecified: Secondary | ICD-10-CM | POA: Diagnosis not present

## 2021-11-28 DIAGNOSIS — E538 Deficiency of other specified B group vitamins: Secondary | ICD-10-CM | POA: Insufficient documentation

## 2021-11-28 DIAGNOSIS — Z79899 Other long term (current) drug therapy: Secondary | ICD-10-CM | POA: Diagnosis not present

## 2021-11-28 DIAGNOSIS — Z7982 Long term (current) use of aspirin: Secondary | ICD-10-CM | POA: Insufficient documentation

## 2021-11-28 DIAGNOSIS — R5383 Other fatigue: Secondary | ICD-10-CM | POA: Insufficient documentation

## 2021-11-28 LAB — RETICULOCYTES
Immature Retic Fract: 7.1 % (ref 2.3–15.9)
RBC.: 4.44 MIL/uL (ref 3.87–5.11)
Retic Count, Absolute: 76.8 10*3/uL (ref 19.0–186.0)
Retic Ct Pct: 1.7 % (ref 0.4–3.1)

## 2021-11-28 LAB — IRON AND IRON BINDING CAPACITY (CC-WL,HP ONLY)
Iron: 109 ug/dL (ref 28–170)
Saturation Ratios: 30 % (ref 10.4–31.8)
TIBC: 367 ug/dL (ref 250–450)
UIBC: 258 ug/dL (ref 148–442)

## 2021-11-28 LAB — CBC WITH DIFFERENTIAL (CANCER CENTER ONLY)
Abs Immature Granulocytes: 0.05 10*3/uL (ref 0.00–0.07)
Basophils Absolute: 0.1 10*3/uL (ref 0.0–0.1)
Basophils Relative: 2 %
Eosinophils Absolute: 0.1 10*3/uL (ref 0.0–0.5)
Eosinophils Relative: 2 %
HCT: 42.5 % (ref 36.0–46.0)
Hemoglobin: 14.5 g/dL (ref 12.0–15.0)
Immature Granulocytes: 1 %
Lymphocytes Relative: 31 %
Lymphs Abs: 1.6 10*3/uL (ref 0.7–4.0)
MCH: 32.1 pg (ref 26.0–34.0)
MCHC: 34.1 g/dL (ref 30.0–36.0)
MCV: 94 fL (ref 80.0–100.0)
Monocytes Absolute: 0.5 10*3/uL (ref 0.1–1.0)
Monocytes Relative: 9 %
Neutro Abs: 3 10*3/uL (ref 1.7–7.7)
Neutrophils Relative %: 55 %
Platelet Count: 224 10*3/uL (ref 150–400)
RBC: 4.52 MIL/uL (ref 3.87–5.11)
RDW: 11.8 % (ref 11.5–15.5)
WBC Count: 5.3 10*3/uL (ref 4.0–10.5)
nRBC: 0 % (ref 0.0–0.2)

## 2021-11-28 LAB — FERRITIN: Ferritin: 49 ng/mL (ref 11–307)

## 2021-11-28 NOTE — Progress Notes (Signed)
Hematology and Oncology Follow Up Visit  Erika [name] [id number] [birth year] 76 y.o. 11/28/2021   Principle Diagnosis:  Iron deficiency anemia  Elevated homocystine level    Current Therapy:        IV iron as indicated  Folic acid 1 mg PO Daily   Interim History:  Erika [name] is here today with her husband for follow-up. She is doing well but does note some occasional fatigue. She had been having some troubles sleeping. They were able to change the time of day she took several medications and this has helped her be able to rest better.  Hgb is stable at 14.5, MCV 94, platelets 224 and WBC count 5.3.  No blood loss, bruising or petechiae.  No fever, chills, n/v, cough, rash, dizziness, SOB, chest pain, palpitations, abdominal pain or changes in bowel or bladder habits.  No swelling, tenderness, numbness or tingling in her extremities at this time.  No falls or syncope.  Appetite comes and goes. She will try adding a Boost or Ensure as needed. She feels that she is staying well hydrated. Her weight is 151 lbs (previously 162 lbs in June).  ECOG Performance Status: 1 - Symptomatic but completely ambulatory  Medications:  Allergies as of 11/28/2021   No Known Allergies      Medication List        Accurate as of November 28, 2021  9:24 AM. If you have any questions, ask your nurse or doctor.          amLODipine 10 MG tablet Commonly known as: NORVASC Take 0.5 tablets (5 mg total) by mouth 2 (two) times daily.   aspirin EC 81 MG tablet Take 1 tablet (81 mg total) by mouth daily.   CITRACAL PLUS PO Take 1 tablet by mouth 2 (two) times daily.   donepezil 10 MG tablet Commonly known as: ARICEPT TAKE 1/2 TABLET BY MOUTH AT BEDTIME   folic acid 1 MG tablet Commonly known as: FOLVITE TAKE 1 TABLET BY MOUTH EVERY DAY   furosemide 20 MG tablet Commonly known as: LASIX Take 0.5 tablets (10 mg total) by mouth daily as needed for edema.   metoprolol succinate 50 MG 24  hr tablet Commonly known as: TOPROL-XL TAKE 1 TABLET BY MOUTH TWICE A DAY WITH OR IMMEDIATELY FOLLOWING A MEAL   rosuvastatin 20 MG tablet Commonly known as: CRESTOR Take 1 tablet (20 mg total) by mouth daily.   trospium 20 MG tablet Commonly known as: SANCTURA TAKE 1 TABLET BY MOUTH EVERY DAY   UNABLE TO FIND COGNITIVE TABLET DAILY   valsartan 320 MG tablet Commonly known as: DIOVAN Take 1 tablet (320 mg total) by mouth daily.        Allergies: No Known Allergies  Past Medical History, Surgical history, Social history, and Family History were reviewed and updated.  Review of Systems: All other 10 point review of systems is negative.   Physical Exam:  height is '5\' 6"'$  (1.676 m) and weight is 151 lb 14.1 oz (68.9 kg). Her oral temperature is 98.3 F (36.8 C). Her blood pressure is 159/48 (abnormal) and her pulse is 50 (abnormal). Her respiration is 18 and oxygen saturation is 100%.   Wt Readings from Last 3 Encounters:  11/28/21 151 lb 14.1 oz (68.9 kg)  08/30/21 162 lb 3.2 oz (73.6 kg)  06/27/21 168 lb (76.2 kg)    Ocular: Sclerae unicteric, pupils equal, round and reactive to light Ear-nose-throat: Oropharynx clear, dentition fair Lymphatic: No cervical  or supraclavicular adenopathy Lungs no rales or rhonchi, good excursion bilaterally Heart regular rate and rhythm, no murmur appreciated Abd soft, nontender, positive bowel sounds MSK no focal spinal tenderness, no joint edema Neuro: non-focal, well-oriented, appropriate affect Breasts: Deferred   Lab Results  Component Value Date   WBC 5.3 11/28/2021   HGB 14.5 11/28/2021   HCT 42.5 11/28/2021   MCV 94.0 11/28/2021   PLT 224 11/28/2021   Lab Results  Component Value Date   FERRITIN 57 05/31/2021   IRON 105 05/31/2021   TIBC 361 05/31/2021   UIBC 256 05/31/2021   IRONPCTSAT 29 05/31/2021   Lab Results  Component Value Date   RETICCTPCT 1.7 11/28/2021   RBC 4.44 11/28/2021   RBC 4.52 11/28/2021    No results found for: "KPAFRELGTCHN", "LAMBDASER", "KAPLAMBRATIO" No results found for: "IGGSERUM", "IGA", "IGMSERUM" No results found for: "TOTALPROTELP", "ALBUMINELP", "A1GS", "A2GS", "BETS", "BETA2SER", "GAMS", "MSPIKE", "SPEI"   Chemistry      Component Value Date/Time   NA 140 06/20/2021 1008   K 4.0 06/20/2021 1008   CL 103 06/20/2021 1008   CO2 30 06/20/2021 1008   BUN 14 06/20/2021 1008   CREATININE 1.04 06/20/2021 1008   CREATININE 1.19 (H) 12/28/2020 0846   CREATININE 1.23 (H) 11/18/2019 0932      Component Value Date/Time   CALCIUM 9.5 06/20/2021 1008   ALKPHOS 83 06/20/2021 1008   AST 16 06/20/2021 1008   AST 15 12/28/2020 0846   ALT 15 06/20/2021 1008   ALT 11 12/28/2020 0846   BILITOT 0.9 06/20/2021 1008   BILITOT 0.7 12/28/2020 0846       Impression and Plan: Erika [name] is a very pleasant 76 yo caucasian female with history of multifactorial anemia including iron deficiency and B 12 deficiency (managed by PCP). She also had a slightly elevated homocystine level. Folic acid PO daily.  Iron studies are pending.  Follow-up in 6 months.   [name], NP 9/25/20239:24 AM

## 2021-11-29 LAB — HOMOCYSTEINE: Homocysteine: 11.1 umol/L (ref 0.0–19.2)

## 2021-11-30 ENCOUNTER — Telehealth: Payer: Self-pay

## 2021-11-30 NOTE — Telephone Encounter (Signed)
Called pt was advised  

## 2021-12-11 ENCOUNTER — Other Ambulatory Visit: Payer: Self-pay | Admitting: Family Medicine

## 2021-12-14 NOTE — Assessment & Plan Note (Addendum)
Tolerating Aricept at 5 mg. The drive to [facility] is proving difficult so they have requested a referral to [facility] to manage her dementia. Referral placed today

## 2021-12-14 NOTE — Assessment & Plan Note (Signed)
Supplement and monitor 

## 2021-12-14 NOTE — Assessment & Plan Note (Signed)
Encourage heart healthy diet such as MIND or DASH diet, increase exercise, avoid trans fats, simple carbohydrates and processed foods, consider a krill or fish or flaxseed oil cap daily.  °

## 2021-12-14 NOTE — Assessment & Plan Note (Addendum)
Well controlled, no changes to meds. Encouraged heart healthy diet such as the DASH diet and exercise as tolerated.  Consider Tetanus and prevnar 20 COVID booster 

## 2021-12-15 ENCOUNTER — Ambulatory Visit (INDEPENDENT_AMBULATORY_CARE_PROVIDER_SITE_OTHER): Payer: PPO | Admitting: Family Medicine

## 2021-12-15 VITALS — BP 144/64 | HR 64 | Temp 98.0°F | Resp 16 | Ht 66.0 in | Wt 151.0 lb

## 2021-12-15 DIAGNOSIS — I10 Essential (primary) hypertension: Secondary | ICD-10-CM | POA: Diagnosis not present

## 2021-12-15 DIAGNOSIS — G3109 Other frontotemporal dementia: Secondary | ICD-10-CM

## 2021-12-15 DIAGNOSIS — R7989 Other specified abnormal findings of blood chemistry: Secondary | ICD-10-CM | POA: Diagnosis not present

## 2021-12-15 DIAGNOSIS — R739 Hyperglycemia, unspecified: Secondary | ICD-10-CM | POA: Diagnosis not present

## 2021-12-15 DIAGNOSIS — F02A0 Dementia in other diseases classified elsewhere, mild, without behavioral disturbance, psychotic disturbance, mood disturbance, and anxiety: Secondary | ICD-10-CM | POA: Diagnosis not present

## 2021-12-15 DIAGNOSIS — E782 Mixed hyperlipidemia: Secondary | ICD-10-CM | POA: Diagnosis not present

## 2021-12-15 DIAGNOSIS — E538 Deficiency of other specified B group vitamins: Secondary | ICD-10-CM | POA: Diagnosis not present

## 2021-12-15 LAB — LIPID PANEL
Cholesterol: 149 mg/dL (ref 0–200)
HDL: 42.8 mg/dL (ref >39.00)
LDL Cholesterol: 81 mg/dL (ref 0–99)
NonHDL: 106.55
Total CHOL/HDL Ratio: 3
Triglycerides: 128 mg/dL (ref 0.0–149.0)
VLDL: 25.6 mg/dL (ref 0.0–40.0)

## 2021-12-15 LAB — COMPREHENSIVE METABOLIC PANEL
ALT: 11 U/L (ref 0–35)
AST: 15 U/L (ref 0–37)
Albumin: 4.3 g/dL (ref 3.5–5.2)
Alkaline Phosphatase: 74 U/L (ref 39–117)
BUN: 12 mg/dL (ref 6–23)
CO2: 30 mEq/L (ref 19–32)
Calcium: 9.4 mg/dL (ref 8.4–10.5)
Chloride: 103 mEq/L (ref 96–112)
Creatinine, Ser: 0.97 mg/dL (ref 0.40–1.20)
GFR: 57 mL/min — ABNORMAL LOW (ref >60.00)
Glucose, Bld: 80 mg/dL (ref 70–99)
Potassium: 3.5 mEq/L (ref 3.5–5.1)
Sodium: 142 mEq/L (ref 135–145)
Total Bilirubin: 0.9 mg/dL (ref 0.2–1.2)
Total Protein: 7.1 g/dL (ref 6.0–8.3)

## 2021-12-15 LAB — CBC
HCT: 37.7 % (ref 36.0–46.0)
Hemoglobin: 13.3 g/dL (ref 12.0–15.0)
MCHC: 35.4 g/dL (ref 30.0–36.0)
MCV: 94.7 fl (ref 78.0–100.0)
Platelets: 257 10*3/uL (ref 150.0–400.0)
RBC: 3.98 Mil/uL (ref 3.87–5.11)
RDW: 13.2 % (ref 11.5–15.5)
WBC: 6.7 10*3/uL (ref 4.0–10.5)

## 2021-12-15 LAB — VITAMIN B12: Vitamin B-12: 213 pg/mL (ref 211–911)

## 2021-12-15 LAB — HEMOGLOBIN A1C: Hgb A1c MFr Bld: 5.1 % (ref 4.6–6.5)

## 2021-12-15 LAB — TSH: TSH: 1.2 u[IU]/mL (ref 0.35–5.50)

## 2021-12-15 LAB — VITAMIN D 25 HYDROXY (VIT D DEFICIENCY, FRACTURES): VITD: 47.17 ng/mL (ref 30.00–100.00)

## 2021-12-15 NOTE — Patient Instructions (Signed)
RSV (respiratory syncitial virus) vaccine at pharmacy, Arexvy Tetanus any time Prevnar any time Covid booster any time  Space all shots by roughly 2 weeks

## 2021-12-15 NOTE — Progress Notes (Signed)
Subjective:   By signing my name below, I, Kellie Simmering, attest that this documentation has been prepared under the direction and in the presence of [name], MD 12/15/2021.    Patient ID: Erika [name], female    DOB: [birth year], 76 y.o.   MRN: [id number]  Chief Complaint  Patient presents with   Follow-up    Here for follow up    HPI Patient is in today for an office visit. Her husband is with her today and also speaking on her behalf.   Cognitive Function: She is currently taking 0.5 of Donepezil 10 mg daily to improve her cognitive function. However, her husband reports there has not been any improvement. She is interested in receiving a referral to [facility].  Immunizations: She has been informed about receiving COVID-19, Pneumonia, RSV, and Tetanus immunizations. She has received the high-dose Flu immunization.  Lower Extremity Swelling: She denies having recent lower extremity swelling and takes Furosemide 20 mg as needed.  Tongue Sore: She complains of a sore on the tip of her tongue that has been causing irritation.  Supplements: She currently takes calcium and vitamin D supplements.  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   Cerebrovascular disease    Chest pain 01/19/2014   Chronic rhinitis 01/13/2009   Diverticulosis of colon    colonoscopy 04/14/1999   Dry mouth 03/13/2012   Fatigue 08/02/2014   GERD (gastroesophageal reflux disease)    Headache 02/11/2018   History of chicken pox    as a child   History of fibrocystic disease of breast    History of measles    as a child   History of mumps    as a child   Hyperlipidemia, mixed 02/20/2007   Goal LDL < 130 HBP/Pos fm hx   Hypertension    Hypokalemia 06/24/2012   Hyponatremia 08/02/2014   IBS (irritable bowel syndrome)  04/02/2011   IDA (iron deficiency anemia) 12/28/2020   Low vitamin D level 01/09/2016   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   Obstructive sleep apnea 05/23/2017   uses CPAP nightly   Osteopenia after menopause    Overactive bladder 04/02/2011   Pedal edema 05/20/2020   Shoulder pain, bilateral 06/14/2011   Thoracic back pain 07/31/2017   Vitamin B 12 deficiency 10/16/2020   Past Surgical History:  Procedure Laterality Date   BREAST REDUCTION SURGERY Bilateral 1986   BUNIONECTOMY Bilateral 1980   CATARACT EXTRACTION, BILATERAL     COLONOSCOPY  2011   HAMMER TOE SURGERY Right 12/03/2013   foot   MOUTH SURGERY  01/10/2013   negative   TONSILLECTOMY     UPPER GASTROINTESTINAL ENDOSCOPY  2016   RK   Family History  Problem Relation Age of Onset   Stroke Mother 19   Hyperlipidemia Mother    Hypertension Mother    Heart attack Father 49   Hypertension Father    Heart disease Father    Hypertension Brother    Aneurysm Brother        aortic   Hyperlipidemia Brother    Heart disease Brother        aortic aneurysm rupture   Multiple sclerosis Daughter    Alcohol abuse Son    Schizophrenia Son    Bipolar  disorder Son    Mental illness Son        schizophrenic, bipolar   Colon cancer Neg Hx    Colon polyps Neg Hx    Esophageal cancer Neg Hx    Rectal cancer Neg Hx    Stomach cancer Neg Hx    Alzheimer's disease Neg Hx    Social History   Socioeconomic History   Marital status: Married    Spouse name: Not on file   Number of children: 4   Years of education: 14   Highest education level: Associate degree: academic program  Occupational History   Occupation: Retired    Comment: Arboriculturist  Tobacco Use   Smoking status: Never   Smokeless tobacco: Never  Vaping Use   Vaping Use: Never used  Substance and Sexual Activity   Alcohol use: No   Drug use: No   Sexual  activity: Not Currently  Other Topics Concern   Not on file  Social History Narrative   4 children 2 are adopted      Lives at home with husband, children live closeby    Right handed   Caffeine: maybe 1-3 "or more" cups/day   Social Determinants of Health   Financial Resource Strain: Not on file  Food Insecurity: Not on file  Transportation Needs: Not on file  Physical Activity: Not on file  Stress: Not on file  Social Connections: Not on file  Intimate Partner Violence: Not on file   Outpatient Medications Prior to Visit  Medication Sig Dispense Refill   amLODipine (NORVASC) 10 MG tablet Take 0.5 tablets (5 mg total) by mouth 2 (two) times daily. 90 tablet 1   aspirin EC 81 MG tablet Take 1 tablet (81 mg total) by mouth daily.     donepezil (ARICEPT) 10 MG tablet TAKE 1/2 TABLET BY MOUTH AT BEDTIME 45 tablet 1   folic acid (FOLVITE) 1 MG tablet TAKE 1 TABLET BY MOUTH EVERY DAY 90 tablet 2   furosemide (LASIX) 20 MG tablet Take 0.5 tablets (10 mg total) by mouth daily as needed for edema. 45 tablet 1   metoprolol succinate (TOPROL-XL) 50 MG 24 hr tablet TAKE 1 TABLET BY MOUTH TWICE A DAY WITH OR IMMEDIATELY FOLLOWING A MEAL 180 tablet 2   Multiple Minerals-Vitamins (CITRACAL PLUS PO) Take 1 tablet by mouth 2 (two) times daily.     rosuvastatin (CRESTOR) 20 MG tablet TAKE 1 TABLET BY MOUTH EVERY DAY 90 tablet 1   trospium (SANCTURA) 20 MG tablet TAKE 1 TABLET BY MOUTH EVERY DAY 90 tablet 1   UNABLE TO FIND COGNITIVE TABLET DAILY     valsartan (DIOVAN) 320 MG tablet TAKE 1 TABLET BY MOUTH EVERY DAY 90 tablet 1   Facility-Administered Medications Prior to Visit  Medication Dose Route Frequency Provider Last Rate Last Admin   cyanocobalamin ((VITAMIN B-12)) injection 1,000 mcg  1,000 mcg Intramuscular Q30 days Carollee Herter, Kendrick Fries R, DO   1,000 mcg at 10/27/20 1052   No Known Allergies  ROS    Objective:    Physical Exam Constitutional:      General: She is not in acute  distress.    Appearance: Normal appearance. She is not ill-appearing.  HENT:     Head: Normocephalic and atraumatic.     Right Ear: External ear normal.     Left Ear: External ear normal.     Mouth/Throat:     Mouth: Mucous membranes are moist.     Pharynx:  Oropharynx is clear.     Comments: Sore on the tip of tongue.  Eyes:     Extraocular Movements: Extraocular movements intact.     Pupils: Pupils are equal, round, and reactive to light.  Cardiovascular:     Rate and Rhythm: Normal rate and regular rhythm.     Pulses: Normal pulses.     Heart sounds: Normal heart sounds. No murmur heard.    No gallop.  Pulmonary:     Effort: Pulmonary effort is normal. No respiratory distress.     Breath sounds: Normal breath sounds. No wheezing or rales.  Abdominal:     General: Bowel sounds are normal.  Skin:    General: Skin is warm and dry.  Neurological:     Mental Status: She is alert and oriented to person, place, and time.  Psychiatric:        Mood and Affect: Mood normal.        Behavior: Behavior normal.        Judgment: Judgment normal.    BP (!) 144/64 (BP Location: Right Arm, Patient Position: Sitting, Cuff Size: Normal)   Pulse 64   Temp 98 F (36.7 C) (Oral)   Resp 16   Ht '5\' 6"'$  (1.676 m)   Wt 151 lb (68.5 kg)   SpO2 95%   BMI 24.37 kg/m  Wt Readings from Last 3 Encounters:  12/15/21 151 lb (68.5 kg)  11/28/21 151 lb 14.1 oz (68.9 kg)  08/30/21 162 lb 3.2 oz (73.6 kg)   Diabetic Foot Exam - Simple   No data filed    Lab Results  Component Value Date   WBC 6.7 12/15/2021   HGB 13.3 12/15/2021   HCT 37.7 12/15/2021   PLT 257.0 12/15/2021   GLUCOSE 80 12/15/2021   CHOL 149 12/15/2021   TRIG 128.0 12/15/2021   HDL 42.80 12/15/2021   LDLDIRECT 163.0 07/24/2014   LDLCALC 81 12/15/2021   ALT 11 12/15/2021   AST 15 12/15/2021   NA 142 12/15/2021   K 3.5 12/15/2021   CL 103 12/15/2021   CREATININE 0.97 12/15/2021   BUN 12 12/15/2021   CO2 30 12/15/2021    TSH 1.20 12/15/2021   HGBA1C 5.1 12/15/2021   Lab Results  Component Value Date   TSH 1.20 12/15/2021   Lab Results  Component Value Date   WBC 6.7 12/15/2021   HGB 13.3 12/15/2021   HCT 37.7 12/15/2021   MCV 94.7 12/15/2021   PLT 257.0 12/15/2021   Lab Results  Component Value Date   NA 142 12/15/2021   K 3.5 12/15/2021   CO2 30 12/15/2021   GLUCOSE 80 12/15/2021   BUN 12 12/15/2021   CREATININE 0.97 12/15/2021   BILITOT 0.9 12/15/2021   ALKPHOS 74 12/15/2021   AST 15 12/15/2021   ALT 11 12/15/2021   PROT 7.1 12/15/2021   ALBUMIN 4.3 12/15/2021   CALCIUM 9.4 12/15/2021   ANIONGAP 9 12/28/2020   GFR 57.00 (L) 12/15/2021   Lab Results  Component Value Date   CHOL 149 12/15/2021   Lab Results  Component Value Date   HDL 42.80 12/15/2021   Lab Results  Component Value Date   LDLCALC 81 12/15/2021   Lab Results  Component Value Date   TRIG 128.0 12/15/2021   Lab Results  Component Value Date   CHOLHDL 3 12/15/2021   Lab Results  Component Value Date   HGBA1C 5.1 12/15/2021      Assessment & Plan:  Problem List Items Addressed This Visit     Hyperlipidemia, mixed    Encourage heart healthy diet such as MIND or DASH diet, increase exercise, avoid trans fats, simple carbohydrates and processed foods, consider a krill or fish or flaxseed oil cap daily.       Relevant Orders   Lipid panel (Completed)   Hypertension    Well controlled, no changes to meds. Encouraged heart healthy diet such as the DASH diet and exercise as tolerated.  Consider Tetanus and prevnar 20 COVID booster      Relevant Orders   CBC (Completed)   Comprehensive metabolic panel (Completed)   TSH (Completed)   Low vitamin D level    Supplement and monitor      Relevant Orders   VITAMIN D 25 Hydroxy (Vit-D Deficiency, Fractures) (Completed)   Vitamin B 12 deficiency    Supplement and monitor      Relevant Orders   Vitamin B12 (Completed)   Major neurocognitive  disorder due to frontotemporal dementia (language variant)    Tolerating Aricept at 5 mg. The drive to [place] is proving difficult so they have requested a referral to [facility] to manage her dementia. Referral placed today      Relevant Orders   Ambulatory referral to Neurology   Hyperglycemia - Primary    hgba1c acceptable, minimize simple carbs. Increase exercise as tolerated.      Relevant Orders   Comprehensive metabolic panel (Completed)   Hemoglobin A1c (Completed)   No orders of the defined types were placed in this encounter.  I, Penni Homans, MD, personally preformed the services described in this documentation.  All medical record entries made by the scribe were at my direction and in my presence.  I have reviewed the chart and discharge instructions (if applicable) and agree that the record reflects my personal performance and is accurate and complete. 12/15/2021  I,Mohammed Iqbal,acting as a scribe for Penni Homans, MD.,have documented all relevant documentation on the behalf of Penni Homans, MD,as directed by  Penni Homans, MD while in the presence of Penni Homans, MD.  Penni Homans, MD

## 2021-12-15 NOTE — Assessment & Plan Note (Signed)
hgba1c acceptable, minimize simple carbs. Increase exercise as tolerated.  

## 2021-12-22 NOTE — Progress Notes (Signed)
[facility] [address] [place]. [place] [address] [phone]       OFFICE FOLLOW UP NOTE  Erika [name] Date of Birth:  1945/11/08 Medical Record Number:  [id number]    Primary neurologist: Dr. Rexene [name] Reason for visit: Memory loss    SUBJECTIVE:   CHIEF COMPLAINT:  Chief Complaint  Patient presents with   Follow-up Erika [name]    Pt reports feeling pretty good overall. She states she still has headaches and asking of she can take Tylenol. She also is asking about the generic brand of Trospium Chloride for a less expensive. Room 3 with husband    HPI:   Update 12/26/2021 Erika [name]: Patient returns for reevaluation of memory loss accompanied by her husband.  Previously seen 6 months ago, during the interval time she was seen at [place] [facility] but she requested continued follow-up in this office.  MMSE today 23/30 (previously 25/30).  She is currently on Aricept 5 mg nightly, [facility] memory clinic recommended increasing dose to 10 mg nightly, currently taking Aricept 35m twice daily.  She believes overall her memory has been stable.  She continues to have occasional speech difficulties and delayed memory recall but this is not new.   She mentions other concerns: Does have some visual hallucinations just prior to falling asleep and some difficulty falling asleep. Recently made medication changes with PCP with some improvement, occurs about 1x per month, has not further discussed with her sleep specialist.  Reports nightly compliance with CPAP.  Concerns about losing weight, "just doesn't feel hungry", recently seen by PCP for this, lab work satisfactory.   Questions generic options for SCloyd Stagerswhich she takes for overactive bladder, this question was deferred back to PCP      History provided for reference purposes only Update 06/27/2021 Erika [name]: 76year old female who returns for follow-up visit for memory loss after prior visit with Dr. ARexene Albertson 1/23.   She is accompanied by her husband.  Completed PET scan 04/2021 which did not show any classic findings for typical Alzheimer's dementia or frontotemporal dementia, per Dr. ARexene Albertspossible mixed form of dementia including vascular and underlying vascular risk factors.  [facility].  Reports cognition stable since prior visit. Does use compensation strategies. Keeping busy in different activities and learning new things.  She remains very active and helps watch her grandchildren during the day along with her husband.  Currently on Aricept 5 mg nightly tolerating without side effects. MMSE today 25/30 (prior 18/30). Believes aricept has provided significant improvement especially with confusions and delusions.  Still having some speech difficulties, completed speech therapy with noted improvement but still not quite at baseline.  Does c/o mild frontal headaches over the past few months.  Denies issues with headaches in the past (although previously seen by Dr. ARexene Albertsfor episodic headaches). She does have seasonal allergies and has been having some congestion. Not currently on any allergy medication. Headaches mild and are not debilitating, no photophobia, phonophobia or N/V. No visual changes associated or any other neurological symptoms. Has been taking tylenol to help with headaches with benefit.   Continues to follow with pulmonology for OSA, reports CPAP compliance.  Continues to follow with oncology for iron deficiency anemia.  No further concerns at this time    History copied from Dr. AGuadelupe Sabinprior OBurnanote for reference purposes only Erika [name] a 76year old right-handed woman with an  underlying medical history of allergies, arthritis, reflux disease, hypertension, hyperlipidemia, irritable bowel syndrome, sleep apnea, mildly overweight state, and vitamin B12 deficiency, who presents for follow-up consultation of  her memory loss.  The patient is accompanied by her husband today; her daughter is on speaker phone.  I saw her on 09/30/2020 at the request of her primary care physician, at which time she reported a several month history of short-term memory issues, including forgetfulness, losing train of thought, word finding difficulty.  Her MMSE was 18 at the time.  She had recently been found to have B12 deficiency.  She was advised to proceed with B12 supplementation and also proceed with neuropsychological evaluation for in-depth cognitive testing. She was advised to proceed with a brain MRI.  She had a brain MRI without contrast on 10/04/2020 and I reviewed the results: IMPRESSION: 1. No acute intracranial abnormality. 2. Moderate chronic microvascular ischemic changes of the white matter.   She was notified of her test results.    She had neuropsychological Evaluation and testing with Dr. Hazle Coca on 02/21/21 and a subsequent FU appt on 03/01/21. I reviewed the results and recommendations:    << Clinical Impression(s): Erika [name] pattern of performance is suggestive of severe impairment surrounding executive functioning (including safety and judgment), receptive and expressive language, and nearly all aspects of verbal learning and memory. Relative weaknesses were also exhibited across processing speed, complex attention (i.e., working memory), visuospatial abilities, and aspects of visual memory. Performance was appropriate across basic attention. Regarding ADLs, Erika [name]'s husband manages all medication, financial, and bill paying responsibilities. There were reports of [name] overmedicating herself prior to him taking the former over. While she described herself as a good driver, her husband expressed concerns surrounding her getting lost. Overall, given ADL dysfunction coupled with cognitive impairment described above, Erika [name] meets diagnostic criteria for a Major Neurocognitive Disorder  ("dementia") at the present time.   The etiology for ongoing dysfunction is difficult to discern and likely multifactorial in nature. Based upon testing results, prominent and severe impairment surrounding executive functioning and receptive/expressive language certainly raises the concern for the presence of frontotemporal dementia. While she does not exhibit disinhibited features of the behavioral variant of this disease process, language dysfunction certainly raises concerns for a language variant (i.e., primary progressive aphasia presentation). Of common language variant subtypes, Erika [name] does not demonstrate non-fluent, halting speech, nor does she exhibit strong evidence for diminished semantic knowledge, making non-fluent primary progressive aphasia and semantic dementia presentations unlikely. 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 While her age is somewhat advanced for onset of this condition, it is unclear how long these symptoms have been developing.    Given verbal memory impairment, there are additional concerns surrounding Alzheimer's disease. However, Erika [name] was able to recognize previously learned story and figure based information reasonably well after a delay. This does not suggest a consistent memory storage deficit, which would make this condition somewhat less likely. It is important to highlight that logopenic aphasia presentations often share the same neuropathology as Alzheimer's disease and that these two conditions can have notably overlapping symptoms as a result.   Outside of these causes, there is likely a vascular contribution to her overall presentation given recent neuroimaging suggesting stable moderate to severe small vessel ischemic disease. However, current deficits are far beyond  what would be expected  from these cerebrovascular changes alone. She does not exhibit behavioral features of Lewy body dementia or other more rare parkinsonian conditions at the present time. Continued medical monitoring will be important moving forward.    Recommendations: Ms. Shadd should discuss medications aimed to address memory loss and overall cognitive decline with Dr. Rexene [name]. It is important to highlight that this medication has been shown to slow functional decline in some individuals. There is no current treatment which can stop or reverse cognitive decline when caused by a neurodegenerative illness.    Additional neuroimaging in the form of a FDG-PET scan would likely be useful in providing further diagnostic clarity regarding concerns versus dementia due to Alzheimer's disease or frontotemporal dementia.    Admittedly, performance across neurocognitive testing is not a strong predictor of an individual's safety operating a motor vehicle. However, given the extent of ongoing impairment, I would recommend that she abstain from driving pending a formal driving evaluation. Should her family wish to pursue a formalized driving evaluation, they could reach out to the following agencies: [employer] in [place]: [phone] Driver [facility]habil[facility]: [facility]: [place]: [phone] or [phone]   Should there be further progression of current deficits over time, Erika [name] is unlikely to regain any independent living skills lost. She will likely benefit from the establishment and maintenance of a routine in order to maximize her functional abilities over time.   It will be important for her to have another person with her when in situations where she may need to process information, weigh the pros and cons of different options, and make decisions, in order to ensure that she fully understands and recalls all information to  be considered.   If not already done, Erika [name] and her family may want to discuss her wishes regarding durable power of attorney and medical decision making, so that she can have input into these choices. Additionally, they may wish to discuss future plans for caretaking and seek out community options for in home/residential care should they become necessary.   Erika [name] is encouraged to attend to lifestyle factors for brain health (e.g., regular physical exercise, good nutrition habits, regular participation in cognitively-stimulating activities, and general stress management techniques), which are likely to have benefits for both emotional adjustment and cognition. Optimal control of vascular risk factors (including safe cardiovascular exercise and adherence to dietary recommendations) is encouraged. Likewise, continued compliance with her CPAP machine will also be important. Continued participation in activities which provide mental stimulation and social interaction is also recommended.    Important information should be provided to Erika [name] in written format in all instances. This information should be placed in a highly frequented and easily visible location within her home to promote recall. External strategies such as written notes in a consistently used memory journal, visual and nonverbal auditory cues such as a calendar on the refrigerator or appointments with alarm, such as on a cell phone, can also help maximize recall.   Presenting information in short, concrete sentences will be helpful to ensure her comprehension. It will also be important to have her paraphrase back information rather than simply repeat to allow those working with her to ensure she understands what is being asked of her and/or told to her.   To address problems with processing speed, she may wish to consider:   -Ensuring that she is alerted when essential material or instructions are being presented   -Adjusting  the speed at which  new information is presented   -Allowing for more time in comprehending, processing, and responding in conversation   To address problems with fluctuating attention, she may wish to consider:   -Avoiding external distractions when needing to concentrate   -Limiting exposure to fast paced environments with multiple sensory demands   -Writing down complicated information and using checklists   -Attempting and completing one task at a time (i.e., no multi-tasking)   -Verbalizing aloud each step of a task to maintain focus   -Reducing the amount of information considered at one time>>.   Today, 03/28/2021: She reports having had more difficulty remembering new things, word finding difficulty, losing train of thought.  Her husband indicates that she has limited her driving, she endorses that she gets confused while driving.  Her daughter has noticed significant difficulty finding words.  She is questioning the possibility of speech therapy.  She has a history of iron deficiency and B12 deficiency.  Daughter reports that she has not been eating as well.  Patient reports sleeping fairly well but both her husband and daughter are concerned that she is very sleepy during the day.  She drinks caffeine in the form of tea, 2 to 3 cups/day, no alcohol currently.  She has not started any new medications.   Previously:    09/30/20: (She) reports problems with her short-term memory for the past approximately 6 months.  She is particularly bothered by her word finding difficulty and losing her train of thought.  She has had short-term memory issues including forgetfulness.  Some 6 weeks ago her daughter reports patient had a more sudden decline in memory function, seemed confused, was irritable, and this subsequently subsided.  She does not have any sustained anxiety or depression.  She has recently started B12 injections.  She is not necessarily physically very active other than working in the yard  but daughter is concerned that formal exercise is lacking.  Patient tries to hydrate well with water.  She is a non-smoker, no telltale family history of Alzheimer's dementia or dementia in general.  Daughter recently did a [place] and patient scored 16 out of 65. She has not had any recent falls.  She has had some difficulty using her CPAP machine but does use it regularly. She does not drink any alcohol.  She drinks very little caffeine, likes to drink decaf sweet tea.  She does drink several servings of this per day.  Thus far, she has had a couple of B12 injections as she was found to have low B12 recently.   She has a good psychosocial support system.  She lives with her husband.  She has 2 biological daughters and 2 adopted sons.  She is very much involved in their families and with her grandchildren.  She likes to travel.   I reviewed your office note from 09/16/2020.  She had blood work at the time which I was able to review in the chart.  Orthopedic homocystine was mildly elevated, ESR borderline elevated at 32, CMP showed sodium of 139, potassium 4.2, glucose 103, BUN 12, creatinine 1.8, AST 16, ALT 12, CBC with differential showed WBC of 4.8, hemoglobin 11.2, hematocrit 34.6, MCV 77.2, mildly low, RPR nonreactive, B12 low at 175, folate normal at 13.1.  She was started on B12 injections. You ordered a [place]. 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   No acute intracranial  abnormality.         I had previously evaluated her about 2-1/2 years ago for recurrent headaches and left-sided weakness.  She had improved by the time I saw her.  She was in the process of being evaluated for sleep apnea.  I ordered a brain MRI.  She had a brain MRI without contrast on 04/27/2018 and I reviewed the results:  IMPRESSION: 1. No acute intracranial process. 2. Moderate to severe chronic small vessel  ischemic changes.  We called her with her test results.   She had interim sleep evaluation with pulmonology. She is on PAP therapy and had a FU with the NP in 03/22.   Previously:    04/23/18: 76 year old right-handed woman with an underlying medical history of arthritis, particularly shoulder pain bilaterally, osteopenia, irritable bowel syndrome, hypertension, hyperlipidemia, reflux disease, diverticulosis, and obesity, who reports episodic headaches in the fall of last year and also had episodes of dizziness and some weakness in the L hand in December, which lasted maybe for seconds, all of which have improved. She currently feels at baseline. She does not smoke, does not typically utilize alcohol, drinks caffeine regularly, in the form of sweet tea at least 4-5 cups per day and one cup of coffee in the morning typically. She admits that she does not typically drink a lot of water. She has not had an MRI brain, as far as I can see. She may have had some difficulty speaking, also for seconds (?). She has a FHx of stroke in her mother. She has been sleepy during the day. She has had a sleep evaluation and has sleep test pending. Has been quite busy, retired as Copywriter, advertising some 4 years ago. 2 daughters live next door practically, 2 adopted sons. She has 6 GC, takes care of 5 of her GC on a daily basis or helps out in one form or another. She has had stressors. She has had some memory issues.   I reviewed your office note from 02/11/2018. 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   No flow-limiting intracranial stenosis is evident. She is in the process of being evaluated for sleep apnea.        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   Cerebrovascular disease    Chest pain 01/19/2014   Chronic rhinitis 01/13/2009   Diverticulosis of colon    colonoscopy 04/14/1999   Dry mouth 03/13/2012   Fatigue 08/02/2014   GERD (gastroesophageal reflux disease)    Headache 02/11/2018   History of chicken pox    as a child   History of fibrocystic disease of breast    History of measles    as a child   History of mumps    as a child   Hyperlipidemia, mixed 02/20/2007   Goal LDL < 130 HBP/Pos fm hx   Hypertension    Hypokalemia 06/24/2012   Hyponatremia 08/02/2014   IBS (irritable bowel syndrome) 04/02/2011   IDA (iron deficiency anemia) 12/28/2020   Low vitamin  D level 01/09/2016   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   Obstructive sleep apnea 05/23/2017   uses CPAP nightly   Osteopenia after menopause    Overactive bladder 04/02/2011   Pedal edema 05/20/2020   Shoulder pain, bilateral 06/14/2011   Thoracic back pain 07/31/2017   Vitamin B 12 deficiency 10/16/2020    PSH:  Past Surgical History:  Procedure Laterality Date   BREAST REDUCTION SURGERY Bilateral 1986   BUNIONECTOMY Bilateral 1980   CATARACT EXTRACTION, BILATERAL     COLONOSCOPY  2011   HAMMER TOE SURGERY Right 12/03/2013   foot   MOUTH SURGERY  01/10/2013   negative   TONSILLECTOMY     UPPER GASTROINTESTINAL ENDOSCOPY  2016   RK    Social History:  Social History   Socioeconomic History   Marital status: Married    Spouse name: Not on file   Number of children: 4   Years of education: 14   Highest education level: Associate degree: academic program  Occupational History    Occupation: Retired    Comment: Arboriculturist  Tobacco Use   Smoking status: Never   Smokeless tobacco: Never  Vaping Use   Vaping Use: Never used  Substance and Sexual Activity   Alcohol use: No   Drug use: No   Sexual activity: Not Currently  Other Topics Concern   Not on file  Social History Narrative   4 children 2 are adopted      Lives at home with husband, children live closeby    Right handed   Caffeine: maybe 1-3 "or more" cups/day   Social Determinants of Radio broadcast assistant Strain: Not on file  Food Insecurity: Not on file  Transportation Needs: Not on file  Physical Activity: Not on file  Stress: Not on file  Social Connections: Not on file  Intimate Partner Violence: Not on file    Family History:  Family History  Problem Relation Age of Onset   Stroke Mother 80   Hyperlipidemia Mother    Hypertension Mother    Heart attack Father 50   Hypertension Father    Heart disease Father    Hypertension Brother    Aneurysm Brother        aortic   Hyperlipidemia Brother    Heart disease Brother        aortic aneurysm rupture   Multiple sclerosis Daughter    Alcohol abuse Son    Schizophrenia Son    Bipolar disorder Son    Mental illness Son        schizophrenic, bipolar   Colon cancer Neg Hx    Colon polyps Neg Hx    Esophageal cancer Neg Hx    Rectal cancer Neg Hx    Stomach cancer Neg Hx    Alzheimer's disease Neg Hx     Medications:   Current Outpatient Medications on File Prior to Visit  Medication Sig Dispense Refill   amLODipine (NORVASC) 10 MG tablet Take 0.5 tablets (5 mg total) by mouth 2 (two) times daily. 90 tablet 1   aspirin EC 81 MG tablet Take 1 tablet (81 mg total) by mouth daily.     folic acid (FOLVITE) 1 MG tablet TAKE 1 TABLET BY MOUTH EVERY DAY 90 tablet 2   metoprolol succinate (TOPROL-XL) 50 MG 24 hr tablet TAKE 1 TABLET BY MOUTH TWICE A DAY WITH OR IMMEDIATELY FOLLOWING A MEAL 180  tablet 2   Multiple  Minerals-Vitamins (CITRACAL PLUS PO) Take 1 tablet by mouth 2 (two) times daily.     rosuvastatin (CRESTOR) 20 MG tablet TAKE 1 TABLET BY MOUTH EVERY DAY 90 tablet 1   trospium (SANCTURA) 20 MG tablet TAKE 1 TABLET BY MOUTH EVERY DAY 90 tablet 1   UNABLE TO FIND COGNITIVE TABLET DAILY     valsartan (DIOVAN) 320 MG tablet TAKE 1 TABLET BY MOUTH EVERY DAY 90 tablet 1   furosemide (LASIX) 20 MG tablet Take 0.5 tablets (10 mg total) by mouth daily as needed for edema. (Patient not taking: Reported on 12/26/2021) 45 tablet 1   Current Facility-Administered Medications on File Prior to Visit  Medication Dose Route Frequency Provider Last Rate Last Admin   cyanocobalamin ((VITAMIN B-12)) injection 1,000 mcg  1,000 mcg Intramuscular Q30 days Carollee Herter, Kendrick Fries R, DO   1,000 mcg at 10/27/20 1052    Allergies:  No Known Allergies    OBJECTIVE:  Physical Exam  Vitals:   12/26/21 0938  BP: (!) 168/69  Pulse: (!) 52  Weight: 149 lb 2 oz (67.6 kg)  Height: 5' 7"  (1.702 m)    Body mass index is 23.36 kg/m. No results found.  General: well developed, well nourished, pleasant elderly Caucasian female, seated, in no evident distress Head: head normocephalic and atraumatic.   Neck: supple with no carotid or supraclavicular bruits Cardiovascular: regular rate and rhythm, no murmurs Musculoskeletal: no deformity Skin:  no rash/petichiae Vascular:  Normal pulses all extremities   Neurologic Exam Mental Status: Awake and fully alert.  Occasional word finding difficulty but otherwise fluent speech.  No evidence of dysarthria.  Recent memory impaired and remote memory intact. Attention span, concentration and fund of knowledge appropriate during visit. Mood and affect appropriate.     12/26/2021    9:41 AM 06/27/2021   12:36 PM 09/30/2020   12:14 PM  MMSE - Mini Mental State Exam  Orientation to time 3 5 4   Orientation to Place 5 5 4   Registration 3 3 3   Attention/ Calculation 1 2 0  Recall  2 2 0  Language- name 2 objects 2 2 2   Language- repeat 1 1 1   Language- follow 3 step command 3 2 3   Language- read & follow direction 1 1 0  Write a sentence 1 1 1   Copy design 1 1 0  Total score 23 25 18    Cranial Nerves: Pupils equal, briskly reactive to light. Extraocular movements full without nystagmus. Visual fields full to confrontation. Hearing intact. Facial sensation intact. Face, tongue, palate moves normally and symmetrically.  Motor: Normal bulk and tone. Normal strength in all tested extremity muscles Sensory.: intact to touch , pinprick , position and vibratory sensation.  Coordination: Rapid alternating movements normal in all extremities. Finger-to-nose and heel-to-shin performed accurately bilaterally. Gait and Station: Arises from chair without difficulty. Stance is normal. Gait demonstrates normal stride length and balance without use of AD. Tandem walk and heel toe without difficulty.  Reflexes: 1+ and symmetric. Toes downgoing.         ASSESSMENT: Erika [name] is a 76 y.o. year old female with ongoing memory loss concerns associated with aphasia. 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  Was seen by [facility] but wishes for continued follow-ups in this office.     PLAN:  Memory loss:  MMSE 23/30 (  prior 25/30) Subjectively stable  Recommend continued use of Aricept but to take 49m nightly (currently taking 58mBID), new prescription provided Continue routine memory exercises such as crossword puzzles, word search, sudoku, jigsaw puzzles, card games, etc Weight loss: Encouraged continued follow-up with PCP, may benefit from GI referral if symptoms persist but will defer to PCP Sleep concerns: Appears symptoms improving after medication adjustments.  Encouraged her to contact her sleep provider Dr. AlElsworth Sohof symptoms  persist    Follow-up in 6 months or call earlier if needed    CC:  PCP: BlMosie LukesMD    I spent 36 minutes of face-to-face and non-face-to-face time with patient and husband.  This included previsit chart review, lab review, study review, order entry, electronic health record documentation, patient and husband education regarding continued memory loss concerns, review and discussion of MMSE, other concerns as mentioned above and answered all other questions to patient and husband satisfaction   JeFrann RiderAGNP-BC  [facility] [address]uKirbyrRivertonNC [address]Phone [phone]: This document was prepared with digital dictation and possible smart phrase technology. Any transcriptional errors that result from this process are unintentional.

## 2021-12-26 ENCOUNTER — Encounter: Payer: Self-pay | Admitting: Adult Health

## 2021-12-26 ENCOUNTER — Ambulatory Visit: Payer: PPO | Admitting: Adult Health

## 2021-12-26 VITALS — BP 168/69 | HR 52 | Ht 67.0 in | Wt 149.1 lb

## 2021-12-26 DIAGNOSIS — F0390 Unspecified dementia without behavioral disturbance: Secondary | ICD-10-CM | POA: Diagnosis not present

## 2021-12-26 MED ORDER — DONEPEZIL HCL 10 MG PO TABS: 10.0000 mg | ORAL_TABLET | Freq: Every day | ORAL | 5 refills | Status: DC

## 2021-12-26 NOTE — Patient Instructions (Addendum)
Your Plan:  Continue Aricept - start taking '10mg'$  nightly - a new prescription was sent to your pharmacy   Contact your pulmonologist Dr. Elsworth Soho regarding sleep related concerns   Continue to follow with Dr. Charlett Blake regarding weight loss concerns     Follow up in 6 months or call earlier if needed     Thank you for coming to see [place] at [facility]. I hope we have been able to provide you high quality care today.  You may receive a patient satisfaction survey over the next few weeks. We would appreciate your feedback and comments so that we may continue to improve ourselves and the health of our patients.

## 2022-01-12 ENCOUNTER — Encounter: Payer: Self-pay | Admitting: *Deleted

## 2022-01-12 ENCOUNTER — Other Ambulatory Visit: Payer: Self-pay | Admitting: *Deleted

## 2022-01-12 ENCOUNTER — Telehealth: Payer: Self-pay | Admitting: Adult Health

## 2022-01-12 DIAGNOSIS — F0390 Unspecified dementia without behavioral disturbance: Secondary | ICD-10-CM

## 2022-01-12 MED ORDER — DONEPEZIL HCL 10 MG PO TABS: 10.0000 mg | ORAL_TABLET | Freq: Every day | ORAL | 5 refills | Status: DC

## 2022-01-12 NOTE — Telephone Encounter (Signed)
Pt husband came into office, pt is out of donepezil (ARICEPT) 10 MG tablet  and [facility] that was supposed to be put in at pt appt 12/26/2021 Pt [facility] a [id number]  For donepezil (ARICEPT) 10 MG tablet . Pt [facility] states [facility] not [facility] for [facility]ill since September 2023.

## 2022-01-12 NOTE — Telephone Encounter (Signed)
12/26/21 [facility] showed received by pharmacy. [facility]ent [facility]. Receipt confirmed by pharmacy.

## 2022-02-22 ENCOUNTER — Ambulatory Visit (INDEPENDENT_AMBULATORY_CARE_PROVIDER_SITE_OTHER): Payer: PPO | Admitting: *Deleted

## 2022-02-22 DIAGNOSIS — Z780 Asymptomatic menopausal state: Secondary | ICD-10-CM | POA: Diagnosis not present

## 2022-02-22 DIAGNOSIS — Z0000 Encounter for general adult medical examination without abnormal findings: Secondary | ICD-10-CM

## 2022-02-22 DIAGNOSIS — Z1231 Encounter for screening mammogram for malignant neoplasm of breast: Secondary | ICD-10-CM

## 2022-02-22 NOTE — Progress Notes (Signed)
Subjective:   Erika [name] is a 76 y.o. female who presents for Medicare Annual (Subsequent) preventive examination.  I connected with  Janora Norlander on 02/22/22 by a audio enabled telemedicine application and verified that I am speaking with the correct person using two identifiers.  Patient Location: Home  Provider Location: Office/Clinic  I discussed the limitations of evaluation and management by telemedicine. The patient expressed understanding and agreed to proceed.   Review of Systems    Defer to PCP Cardiac Risk Factors include: advanced age (>62mn, >>83women);dyslipidemia;hypertension     Objective:    There were no vitals filed for this visit. There is no height or weight on file to calculate BMI.     02/22/2022    9:42 AM 11/28/2021    9:05 AM 03/31/2021    9:45 AM 03/02/2021    2:09 PM 12/28/2020    9:10 AM 05/06/2020   12:40 PM 06/28/2018   10:03 AM  Advanced Directives  Does Patient Have a Medical Advance Directive? Yes Yes Yes Yes Yes No No  Type of AParamedicof [place]nights [place] will [place]Living will HCurticeLiving will Living will;Healthcare Power of ACarson CityLiving will    Does patient want to make changes to medical advance directive? No - Patient declined No - Patient declined  No - Patient declined     Copy of HFishing [place] Chart? No - copy requested No - copy requested  No - copy requested No - copy requested      Current Medications (verified) Outpatient Encounter Medications as of 02/22/2022  Medication Sig   amLODipine (NORVASC) 10 MG tablet Take 0.5 tablets (5 mg total) by mouth 2 (two) times daily.   aspirin EC 81 MG tablet Take 1 tablet (81 mg total) by mouth daily.   donepezil (ARICEPT) 10 MG tablet Take 1 tablet (10 mg total) by mouth at bedtime.   folic acid (FOLVITE) 1 MG tablet TAKE 1 TABLET BY MOUTH EVERY DAY   furosemide  (LASIX) 20 MG tablet Take 0.5 tablets (10 mg total) by mouth daily as needed for edema. (Patient not taking: Reported on 12/26/2021)   metoprolol succinate (TOPROL-XL) 50 MG 24 hr tablet TAKE 1 TABLET BY MOUTH TWICE A DAY WITH OR IMMEDIATELY FOLLOWING A MEAL   Multiple Minerals-Vitamins (CITRACAL PLUS PO) Take 1 tablet by mouth 2 (two) times daily.   rosuvastatin (CRESTOR) 20 MG tablet TAKE 1 TABLET BY MOUTH EVERY DAY   trospium (SANCTURA) 20 MG tablet TAKE 1 TABLET BY MOUTH EVERY DAY   UNABLE TO FIND COGNITIVE TABLET DAILY   valsartan (DIOVAN) 320 MG tablet TAKE 1 TABLET BY MOUTH EVERY DAY   Facility-Administered Encounter Medications as of 02/22/2022  Medication   cyanocobalamin ((VITAMIN B-12)) injection 1,000 mcg    Allergies (verified) Patient has no known allergies.   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   Cerebrovascular disease    Chest pain 01/19/2014   Chronic rhinitis 01/13/2009   Diverticulosis of colon    colonoscopy 04/14/1999   Dry mouth 03/13/2012   Fatigue 08/02/2014   GERD (gastroesophageal reflux disease)    Headache 02/11/2018   History of chicken pox    as a child   History of fibrocystic disease of breast  History of measles    as a child   History of mumps    as a child   Hyperlipidemia, mixed 02/20/2007   Goal LDL < 130 HBP/Pos fm hx   Hypertension    Hypokalemia 06/24/2012   Hyponatremia 08/02/2014   IBS (irritable bowel syndrome) 04/02/2011   IDA (iron deficiency anemia) 12/28/2020   Low vitamin D level 01/09/2016   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   Obstructive sleep apnea 05/23/2017   uses CPAP nightly   Osteopenia after menopause    Overactive bladder 04/02/2011    Pedal edema 05/20/2020   Shoulder pain, bilateral 06/14/2011   Thoracic back pain 07/31/2017   Vitamin B 12 deficiency 10/16/2020   Past Surgical History:  Procedure Laterality Date   BREAST REDUCTION SURGERY Bilateral 1986   BUNIONECTOMY Bilateral 1980   CATARACT EXTRACTION, BILATERAL     COLONOSCOPY  2011   HAMMER TOE SURGERY Right 12/03/2013   foot   MOUTH SURGERY  01/10/2013   negative   TONSILLECTOMY     UPPER GASTROINTESTINAL ENDOSCOPY  2016   RK   Family History  Problem Relation Age of Onset   Stroke Mother 73   Hyperlipidemia Mother    Hypertension Mother    Heart attack Father 54   Hypertension Father    Heart disease Father    Hypertension Brother    Aneurysm Brother        aortic   Hyperlipidemia Brother    Heart disease Brother        aortic aneurysm rupture   Multiple sclerosis Daughter    Alcohol abuse Son    Schizophrenia Son    Bipolar disorder Son    Mental illness Son        schizophrenic, bipolar   Colon cancer Neg Hx    Colon polyps Neg Hx    Esophageal cancer Neg Hx    Rectal cancer Neg Hx    Stomach cancer Neg Hx    Alzheimer's disease Neg Hx    Social History   Socioeconomic History   Marital status: Married    Spouse name: Not on file   Number of children: 4   Years of education: 14   Highest education level: Associate degree: academic program  Occupational History   Occupation: Retired    Comment: Arboriculturist  Tobacco Use   Smoking status: Never   Smokeless tobacco: Never  Vaping Use   Vaping Use: Never used  Substance and Sexual Activity   Alcohol use: No   Drug use: No   Sexual activity: Not Currently  Other Topics Concern   Not on file  Social History Narrative   4 children 2 are adopted      Lives at home with husband, children live closeby    Right handed   Caffeine: maybe 1-3 "or more" cups/day   Social Determinants of Health   Financial Resource Strain: Low Risk  (02/22/2022)   Overall Financial  Resource Strain (CARDIA)    Difficulty of Paying Living Expenses: Not hard at all  Food Insecurity: No Food Insecurity (02/22/2022)   Hunger Vital Sign    Worried About Running Out of Food in the Last Year: Never true    Ran Out of Food in the Last Year: Never true  Transportation Needs: No Transportation Needs (02/22/2022)   PRAPARE - Transportation    Lack of Transportation (Medical): No    Lack  of Transportation (Non-Medical): No  Physical Activity: Inactive (02/22/2022)   Exercise Vital Sign    Days of Exercise per Week: 0 days    Minutes of Exercise per Session: 0 min  Stress: No Stress Concern Present (02/22/2022)   [place]    Feeling of Stress : Not at all  Social Connections: [place] (02/22/2022)   Social Connection and Isolation Panel [NHANES]    Frequency of Communication with Friends and Family: More than three times a week    Frequency of Social Gatherings with Friends and Family: More than three times a week    Attends Religious Services: More than 4 times per year    Active Member of [employer] or Organizations: Yes    Attends Music therapist: More than 4 times per year    Marital Status: Married    Tobacco Counseling Counseling given: Not Answered   Clinical Intake:  Pre-visit preparation completed: Yes  Pain : No/denies pain  Diabetes: No  How often do you need to have someone help you when you read instructions, pamphlets, or other written materials from your doctor or pharmacy?: 1 - Never   Activities of Daily Living    02/22/2022    9:46 AM  In your present state of health, do you have any difficulty performing the following activities:  Hearing? 0  Vision? 0  Difficulty concentrating or making decisions? 1  Comment memory  Walking or climbing stairs? 0  Dressing or bathing? 0  Doing errands, shopping? 0  Preparing Food and eating ? N  Using the Toilet? N   In the past six months, have you accidently leaked urine? N  Do you have problems with loss of bowel control? N  Managing your Medications? N  Managing your Finances? N  Housekeeping or managing your Housekeeping? N    Patient Care Team: [name], MD as PCP - General (Family Medicine) Molli Posey, MD as Consulting Physician (Obstetrics and Gynecology) Marica Otter, [place] as Consulting Physician (Optometry) Mcarthur Rossetti, MD as Consulting Physician (Orthopedic Surgery) Renelda Mom, DDS as Consulting Physician (Dentistry)  Indicate any recent Medical Services you may have received from other than [facility] providers in the past year (date may be approximate).     Assessment:   This is a routine wellness examination for Ziasia.  Hearing/Vision screen No results found.  Dietary issues and exercise activities discussed: Current Exercise Habits: Home exercise routine, Type of exercise: walking, Time (Minutes): 20, Frequency (Times/Week): 7, Weekly Exercise (Minutes/Week): 140, Intensity: Mild, Exercise limited by: None identified   Goals Addressed   None    Depression Screen    02/22/2022    9:45 AM 12/15/2021   10:14 AM 08/30/2021    4:30 PM 09/16/2020    9:19 AM 06/24/2019    8:18 AM 10/30/2017    8:19 AM 08/07/2016    8:29 AM  PHQ 2/9 Scores  PHQ - 2 Score 0 0 0 0 1 0 1  PHQ- 9 Score  3   6      Fall Risk    02/22/2022    9:42 AM 12/15/2021   10:13 AM 08/30/2021    4:30 PM 09/16/2020    9:17 AM 01/29/2019   11:23 AM  Fall Risk   Falls in the past year? 0 0 0 1 1  Comment     Emmi Telephone Survey: data to providers prior to load  Number falls in  past yr: 0 0 0 1 1  Comment     Emmi Telephone Survey Actual Response = 1  Injury with Fall? 0 0 0 0 1  Risk for fall due to : No Fall Risks      Follow up Falls evaluation completed Falls evaluation completed Falls evaluation completed Falls evaluation completed     [place]:  Any stairs in or around the home? Yes  If so, are there any without handrails? No  Home free of loose throw rugs in walkways, pet beds, electrical cords, etc? Yes  Adequate lighting in your home to reduce risk of falls? Yes   ASSISTIVE DEVICES UTILIZED TO PREVENT FALLS:  Life alert? No  Use of a cane, walker or w/c? No  Grab bars in the bathroom? Yes  Shower chair or bench in shower? Yes  Elevated toilet seat or a handicapped toilet? Yes   TIMED UP AND GO:  Was the test performed?  No, audio visit .    Cognitive Function:    12/26/2021    9:41 AM 06/27/2021   12:36 PM 09/30/2020   12:14 PM 10/30/2017    8:20 AM 08/07/2016    8:33 AM  MMSE - Mini Mental State Exam  Orientation to time '3 5 4 5 5  '$ Orientation to Place '5 5 4 5 5  '$ Registration '3 3 3 3 3  '$ Attention/ Calculation 1 2 0 5 5  Recall 2 2 0 1 3  Language- name 2 objects '2 2 2 2 2  '$ Language- repeat '1 1 1 1 1  '$ Language- follow 3 step command '3 2 3 3 3  '$ Language- read & follow direction 1 1 0 1 1  Write a sentence '1 1 1 1 1  '$ Copy design 1 1 0 1 1  Total score '23 25 18 28 30        '$ 02/22/2022    9:51 AM  6CIT Screen  What Year? 0 points  What month? 0 points  What time? 0 points  Count back from 20 0 points  Months in reverse 2 points  Repeat phrase 8 points  Total Score 10 points    Immunizations Immunization History  Administered Date(s) Administered   Influenza Whole 12/05/2010, 12/04/2012   Influenza, High Dose Seasonal PF 01/01/2015, 11/16/2016, 12/28/2017, 11/05/2019, 12/12/2021   Influenza,inj,Quad PF,6+ Mos 01/12/2014   Influenza-Unspecified 12/30/2014   Moderna Sars-Covid-2 Vaccination 01/01/2020   PFIZER(Purple Top)SARS-COV-2 Vaccination 05/02/2019, 05/28/2019   Pfizer Covid-19 Vaccine Bivalent Booster 2yr & up 02/08/2021   Pneumococcal Conjugate-13 01/13/2014   Pneumococcal Polysaccharide-23 07/22/2008, 12/05/2010   Td 03/06/2010   Zoster Recombinat (Shingrix) 06/19/2018, 10/20/2018    Zoster, Live 02/17/2014    TDAP status: Due, Education has been provided regarding the importance of this vaccine. Advised may receive this vaccine at local pharmacy or Health Dept. Aware to provide a copy of the vaccination record if obtained from local pharmacy or Health Dept. Verbalized acceptance and understanding.  Flu Vaccine status: Up to date  Pneumococcal vaccine status: Due, Education has been provided regarding the importance of this vaccine. Advised may receive this vaccine at local pharmacy or Health Dept. Aware to provide a copy of the vaccination record if obtained from local pharmacy or Health Dept. Verbalized acceptance and understanding.  Covid-19 vaccine status: Information provided on how to obtain vaccines.   Qualifies for Shingles Vaccine? Yes   Zostavax completed Yes   Shingrix Completed?: Yes  Screening  Tests Health Maintenance  Topic Date Due   Medicare Annual Wellness (AWV)  10/31/2018   Pneumonia Vaccine 29+ Years old (3 - PPSV23 or PCV20) 01/14/2019   DTaP/Tdap/Td (2 - Tdap) 03/06/2020   COVID-19 Vaccine (5 - 2023-24 season) 11/04/2021   COLONOSCOPY (Pts 45-33yr Insurance coverage will need to be confirmed)  08/08/2022   INFLUENZA VACCINE  Completed   DEXA SCAN  Completed   Hepatitis C Screening  Completed   Zoster Vaccines- Shingrix  Completed   HPV VACCINES  Aged Out    Health Maintenance  Health Maintenance Due  Topic Date Due   Medicare Annual Wellness (AWV)  10/31/2018   Pneumonia Vaccine 76 Years old (3 - PPSV23 or PCV20) 01/14/2019   DTaP/Tdap/Td (2 - Tdap) 03/06/2020   COVID-19 Vaccine (5 - 2023-24 season) 11/04/2021    Colorectal cancer screening: Type of screening: Colonoscopy. Completed 05/13/19. Repeat every 3 years  Mammogram status: Completed 09/13/20. Repeat every year  Bone Density status: Ordered 02/22/22. Pt provided with contact info and advised to call to schedule appt.  Lung Cancer Screening: (Low Dose CT Chest  recommended if Age 76-80years, 30 pack-year currently smoking OR have quit w/in 15years.) does not qualify.   Additional Screening:  Hepatitis C Screening: does qualify; Completed 05/07/15  Vision Screening: Recommended annual ophthalmology exams for early detection of glaucoma and other disorders of the eye. Is the patient up to date with their annual eye exam?  Yes  Who is the provider or what is the name of the office in which the patient attends annual eye exams? Dr. MSabra HeckIf pt is not established with a provider, would they like to be referred to a provider to establish care? No .   Dental Screening: Recommended annual dental exams for proper oral hygiene  Community Resource Referral / Chronic Care Management: CRR required this visit?  No   CCM required this visit?  No      Plan:     I have personally reviewed and noted the following in the patient's chart:   Medical and social history Use of alcohol, tobacco or illicit drugs  Current medications and supplements including opioid prescriptions. Patient is not currently taking opioid prescriptions. Functional ability and status Nutritional status Physical activity Advanced directives List of other physicians Hospitalizations, surgeries, and ER visits in previous 12 months Vitals Screenings to include cognitive, depression, and falls Referrals and appointments  In addition, I have reviewed and discussed with patient certain preventive protocols, quality metrics, and best practice recommendations. A written personalized care plan for preventive services as well as general preventive health recommendations were provided to patient.   Due to this being a telephonic visit, the after visit summary with patients personalized plan was offered to patient via mail or my-chart. Per request, patient was mailed a copy of AVS.  BBeatris Ship CSpenc[place]  02/22/2022   Nurse Notes: None

## 2022-02-22 NOTE — Patient Instructions (Signed)
Erika [name] , Thank you for taking time to come for your Medicare Wellness Visit. I appreciate your ongoing commitment to your health goals. Please review the following plan we discussed and let me know if I can assist you in the future.   These are the goals we discussed:  Goals      Weight (lb) < 170 lb (77.1 kg)        This is a list of the screening recommended for you and due dates:  Health Maintenance  Topic Date Due   Pneumonia Vaccine (3 - PPSV23 or PCV20) 01/14/2019   DTaP/Tdap/Td vaccine (2 - Tdap) 03/06/2020   COVID-19 Vaccine (5 - 2023-24 season) 11/04/2021   Colon Cancer Screening  08/08/2022   Medicare Annual Wellness Visit  02/23/2023   Flu Shot  Completed   DEXA scan (bone density measurement)  Completed   Hepatitis C Screening: USPSTF Recommendation to screen - Ages 18-79 yo.  Completed   Zoster (Shingles) Vaccine  Completed   HPV Vaccine  Aged Out     Next appointment: Follow up in one year for your annual wellness visit    Preventive Care 65 Years and Older, Female Preventive care refers to lifestyle choices and visits with your health care provider that can promote health and wellness. What does preventive care include? A yearly physical exam. This is also called an annual well check. Dental exams once or twice a year. Routine eye exams. Ask your health care provider how often you should have your eyes checked. Personal lifestyle choices, including: Daily care of your teeth and gums. Regular physical activity. Eating a healthy diet. Avoiding tobacco and drug use. Limiting alcohol use. Practicing safe sex. Taking low-dose aspirin every day. Taking vitamin and mineral supplements as recommended by your health care provider. What happens during an annual well check? The services and screenings done by your health care provider during your annual well check will depend on your age, overall health, lifestyle risk factors, and family history of  disease. Counseling  Your health care provider may ask you questions about your: Alcohol use. Tobacco use. Drug use. Emotional well-being. Home and relationship well-being. Sexual activity. Eating habits. History of falls. Memory and ability to understand (cognition). Work and work Statistician. Reproductive health. Screening  You may have the following tests or measurements: Height, weight, and BMI. Blood pressure. Lipid and cholesterol levels. These may be checked every 5 years, or more frequently if you are over 66 years old. Skin check. Lung cancer screening. You may have this screening every year starting at age 20 if you have a 30-pack-year history of smoking and currently smoke or have quit within the past 15 years. Fecal occult blood test (FOBT) of the stool. You may have this test every year starting at age 6. Flexible sigmoidoscopy or colonoscopy. You may have a sigmoidoscopy every 5 years or a colonoscopy every 10 years starting at age 24. Hepatitis C blood test. Hepatitis B blood test. Sexually transmitted disease (STD) testing. Diabetes screening. This is done by checking your blood sugar (glucose) after you have not eaten for a while (fasting). You may have this done every 1-3 years. Bone density scan. This is done to screen for osteoporosis. You may have this done starting at age 36. Mammogram. This may be done every 1-2 years. Talk to your health care provider about how often you should have regular mammograms. Talk with your health care provider about your test results, treatment options, and if necessary,  the need for more tests. Vaccines  Your health care provider may recommend certain vaccines, such as: Influenza vaccine. This is recommended every year. Tetanus, diphtheria, and acellular pertussis (Tdap, Td) vaccine. You may need a Td booster every 10 years. Zoster vaccine. You may need this after age 6. Pneumococcal 13-valent conjugate (PCV13) vaccine. One  dose is recommended after age 27. Pneumococcal polysaccharide (PPSV23) vaccine. One dose is recommended after age 22. Talk to your health care provider about which screenings and vaccines you need and how often you need them. This information is not intended to replace advice given to you by your health care provider. Make sure you discuss any questions you have with your health care provider. Document Released: 03/19/2015 Document Revised: 11/10/2015 Document Reviewed: 12/22/2014 Elsevier Interactive Patient Education  2017 [place] Prevention in the Home Falls can cause injuries. They can happen to people of all ages. There are many things you can do to make your home safe and to help prevent falls. What can I do on the outside of my home? Regularly fix the edges of walkways and driveways and fix any cracks. Remove anything that might make you trip as you walk through a door, such as a raised step or threshold. Trim any bushes or trees on the path to your home. Use bright outdoor lighting. Clear any walking paths of anything that might make someone trip, such as rocks or tools. Regularly check to see if handrails are loose or broken. Make sure that both sides of any steps have handrails. Any raised decks and porches should have guardrails on the edges. Have any leaves, snow, or ice cleared regularly. Use sand or salt on walking paths during winter. Clean up any spills in your garage right away. This includes oil or grease spills. What can I do in the bathroom? Use night lights. Install grab bars by the toilet and in the tub and shower. Do not use towel bars as grab bars. Use non-skid mats or decals in the tub or shower. If you need to sit down in the shower, use a plastic, non-slip stool. Keep the floor dry. Clean up any water that spills on the floor as soon as it happens. Remove soap buildup in the tub or shower regularly. Attach bath mats securely with double-sided  non-slip rug tape. Do not have throw rugs and other things on the floor that can make you trip. What can I do in the bedroom? Use night lights. Make sure that you have a light by your bed that is easy to reach. Do not use any sheets or blankets that are too big for your bed. They should not hang down onto the floor. Have a firm chair that has side arms. You can use this for support while you get dressed. Do not have throw rugs and other things on the floor that can make you trip. What can I do in the kitchen? Clean up any spills right away. Avoid walking on wet floors. Keep items that you use a lot in easy-to-reach places. If you need to reach something above you, use a strong step stool that has a grab bar. Keep electrical cords out of the way. Do not use floor polish or wax that makes floors slippery. If you must use wax, use non-skid floor wax. Do not have throw rugs and other things on the floor that can make you trip. What can I do with my stairs? Do not leave any items on  the stairs. Make sure that there are handrails on both sides of the stairs and use them. Fix handrails that are broken or loose. Make sure that handrails are as long as the stairways. Check any carpeting to make sure that it is firmly attached to the stairs. Fix any carpet that is loose or worn. Avoid having throw rugs at the top or bottom of the stairs. If you do have throw rugs, attach them to the floor with carpet tape. Make sure that you have a light switch at the top of the stairs and the bottom of the stairs. If you do not have them, ask someone to add them for you. What else can I do to help prevent falls? Wear shoes that: Do not have high heels. Have rubber bottoms. Are comfortable and fit you well. Are closed at the toe. Do not wear sandals. If you use a stepladder: Make sure that it is fully opened. Do not climb a closed stepladder. Make sure that both sides of the stepladder are locked into place. Ask  someone to hold it for you, if possible. Clearly mark and make sure that you can see: Any grab bars or handrails. First and last steps. Where the edge of each step is. Use tools that help you move around (mobility aids) if they are needed. These include: Canes. Walkers. Scooters. Crutches. Turn on the lights when you go into a dark area. Replace any light bulbs as soon as they burn out. Set up your furniture so you have a clear path. Avoid moving your furniture around. If any of your floors are uneven, fix them. If there are any pets around you, be aware of where they are. Review your medicines with your doctor. Some medicines can make you feel dizzy. This can increase your chance of falling. Ask your doctor what other things that you can do to help prevent falls. This information is not intended to replace advice given to you by your health care provider. Make sure you discuss any questions you have with your health care provider. Document Released: 12/17/2008 Document Revised: 07/29/2015 Document Reviewed: 03/27/2014 Elsevier Interactive Patient Education  2017 [employer].

## 2022-02-28 ENCOUNTER — Telehealth (HOSPITAL_BASED_OUTPATIENT_CLINIC_OR_DEPARTMENT_OTHER): Payer: Self-pay

## 2022-03-02 ENCOUNTER — Other Ambulatory Visit: Payer: Self-pay | Admitting: Family Medicine

## 2022-03-07 ENCOUNTER — Ambulatory Visit (INDEPENDENT_AMBULATORY_CARE_PROVIDER_SITE_OTHER): Payer: PPO | Admitting: Family Medicine

## 2022-03-07 ENCOUNTER — Ambulatory Visit (HOSPITAL_BASED_OUTPATIENT_CLINIC_OR_DEPARTMENT_OTHER)
Admission: RE | Admit: 2022-03-07 | Discharge: 2022-03-07 | Disposition: A | Discharge status: Home / Self Care | Payer: PPO | Source: Ambulatory Visit | Attending: Family Medicine | Admitting: Family Medicine

## 2022-03-07 ENCOUNTER — Encounter: Payer: Self-pay | Admitting: Family Medicine

## 2022-03-07 VITALS — BP 165/67 | HR 49 | Temp 98.7°F | Resp 16 | Ht 66.0 in | Wt 145.8 lb

## 2022-03-07 DIAGNOSIS — R634 Abnormal weight loss: Secondary | ICD-10-CM | POA: Insufficient documentation

## 2022-03-07 DIAGNOSIS — K449 Diaphragmatic hernia without obstruction or gangrene: Secondary | ICD-10-CM | POA: Diagnosis not present

## 2022-03-07 LAB — CBC WITH DIFFERENTIAL/PLATELET
Basophils Absolute: 0.1 10*3/uL (ref 0.0–0.1)
Basophils Relative: 1.2 % (ref 0.0–3.0)
Eosinophils Absolute: 0.2 10*3/uL (ref 0.0–0.7)
Eosinophils Relative: 3.3 % (ref 0.0–5.0)
HCT: 41.7 % (ref 36.0–46.0)
Hemoglobin: 14.8 g/dL (ref 12.0–15.0)
Lymphocytes Relative: 28.8 % (ref 12.0–46.0)
Lymphs Abs: 1.8 10*3/uL (ref 0.7–4.0)
MCHC: 35.4 g/dL (ref 30.0–36.0)
MCV: 93.4 fl (ref 78.0–100.0)
Monocytes Absolute: 0.7 10*3/uL (ref 0.1–1.0)
Monocytes Relative: 10.6 % (ref 3.0–12.0)
Neutro Abs: 3.5 10*3/uL (ref 1.4–7.7)
Neutrophils Relative %: 56.1 % (ref 43.0–77.0)
Platelets: 248 10*3/uL (ref 150.0–400.0)
RBC: 4.47 Mil/uL (ref 3.87–5.11)
RDW: 12.6 % (ref 11.5–15.5)
WBC: 6.2 10*3/uL (ref 4.0–10.5)

## 2022-03-07 LAB — T4, FREE: Free T4: 0.95 ng/dL (ref 0.60–1.60)

## 2022-03-07 LAB — COMPREHENSIVE METABOLIC PANEL
ALT: 12 U/L (ref 0–35)
AST: 17 U/L (ref 0–37)
Albumin: 4.3 g/dL (ref 3.5–5.2)
Alkaline Phosphatase: 62 U/L (ref 39–117)
BUN: 15 mg/dL (ref 6–23)
CO2: 30 mEq/L (ref 19–32)
Calcium: 10.2 mg/dL (ref 8.4–10.5)
Chloride: 103 mEq/L (ref 96–112)
Creatinine, Ser: 0.96 mg/dL (ref 0.40–1.20)
GFR: 57.62 mL/min — ABNORMAL LOW (ref >60.00)
Glucose, Bld: 85 mg/dL (ref 70–99)
Potassium: 4.1 mEq/L (ref 3.5–5.1)
Sodium: 142 mEq/L (ref 135–145)
Total Bilirubin: 0.8 mg/dL (ref 0.2–1.2)
Total Protein: 7 g/dL (ref 6.0–8.3)

## 2022-03-07 LAB — TSH: TSH: 1.24 u[IU]/mL (ref 0.35–5.50)

## 2022-03-07 NOTE — Patient Instructions (Signed)
Adding a chest xray and some blood work to check on your weight loss. With your dementia, it is important that you try to eat at least 3 meals a day with snacks even if you aren't feeing hungry. Try a protein supplement like Ensure, Boost, etc.  We will let you know if we have any abnormal findings on these results.

## 2022-03-07 NOTE — Progress Notes (Signed)
Acute Office Visit  Subjective:     Patient ID: Erika [name], female    DOB: [birth year], 77 y.o.   MRN: [id number]  CC: AWV f/u for unintentional weight loss and anxiety  Per chart, appointment was scheduled on 02/22/22 after AWV where she reported anxiety and weight loss. Patient states she just doesn't have much appetite any more. 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 Previous lab work for weight loss unremarkable. She denies any other new symptoms. Reports she continues to have occasional headaches, but reports they are mild and respond to Tylenol. No other neuro symptoms.   Regarding the anxiety, reports that when she was last asked about this it was right before Christmas and she was anxious about everything regarding busy holidays and the grandchildren having to go back to college soon. She denies any SI/HI. She reports she doesn't necessarily feel anxious or depressed, but can get easily emotional/tearful at times. She declines any medication or counseling, but reports she has a great support system.      Wt Readings from Last 3 Encounters:  03/07/22 145 lb 12.8 oz (66.1 kg)  12/26/21 149 lb 2 oz (67.6 kg)  12/15/21 151 lb (68.5 kg)  05/31/21         168 lb      03/07/2022   10:25 AM 02/22/2022    9:45 AM 12/15/2021   10:14 AM  PHQ9 SCORE ONLY  PHQ-9 Total Score 0 0 3      03/07/2022   10:25 AM 12/15/2021   10:15 AM  GAD 7 : Generalized Anxiety Score  Nervous, Anxious, on Edge 0 0  Control/stop worrying 0 0  Worry too much - different things 0 0  Trouble relaxing 0 0  Restless 0 0  Easily annoyed or irritable 0 0  Afraid - awful might happen 0 0  Total GAD 7 Score 0 0  Anxiety Difficulty Not difficult at all        All review of systems negative except what is listed in the HPI      Objective:    BP (!) 165/67    Pulse (!) 49   Temp 98.7 F (37.1 C)   Resp 16   Ht '5\' 6"'$  (1.676 m)   Wt 145 lb 12.8 oz (66.1 kg)   SpO2 98%   BMI 23.53 kg/m    Physical Exam Vitals reviewed.  Constitutional:      Appearance: Normal appearance.  Cardiovascular:     Rate and Rhythm: Normal rate and regular rhythm.     Pulses: Normal pulses.     Heart sounds: Normal heart sounds.  Pulmonary:     Effort: Pulmonary effort is normal.     Breath sounds: Normal breath sounds.  Skin:    General: Skin is warm and dry.  Neurological:     Mental Status: She is alert and oriented to person, place, and time.  Psychiatric:        Mood and Affect: Mood normal.        Behavior: Behavior normal.        Thought Content: Thought content normal.        Judgment: Judgment normal.     No results found for any visits on 03/07/22.      Assessment & Plan:   Problem List Items Addressed This Visit   None Visit  Diagnoses     Unintentional weight loss    -  Primary Previous lab work normal, will repeat a few labs and add CXR. Likely due to decreased oral intake/poor appetite.  Encourage eating balanced meals throughout the day and consider adding Ensure or Boost as a snack 1-2x/day. Patient aware of signs/symptoms requiring further/urgent evaluation.    Relevant Orders   DG Chest 2 View   CBC with Differential/Platelet   Comprehensive metabolic panel   TSH   T4, free     BP elevated today. Patient reports "white coat syndrome." She is agreeable to monitoring at home in a relaxed environment and sending [place] a list of readings so we can address if still elevated.    No orders of the defined types were placed in this encounter.   Return if symptoms worsen or fail to improve, for (keep upcoming PCP appointment).  Terrilyn Saver, NP

## 2022-03-20 ENCOUNTER — Ambulatory Visit (HOSPITAL_BASED_OUTPATIENT_CLINIC_OR_DEPARTMENT_OTHER)
Admission: RE | Admit: 2022-03-20 | Discharge: 2022-03-20 | Disposition: A | Discharge status: Home / Self Care | Payer: PPO | Source: Ambulatory Visit | Attending: Family Medicine | Admitting: Family Medicine

## 2022-03-20 ENCOUNTER — Encounter (HOSPITAL_BASED_OUTPATIENT_CLINIC_OR_DEPARTMENT_OTHER): Payer: Self-pay

## 2022-03-20 DIAGNOSIS — Z780 Asymptomatic menopausal state: Secondary | ICD-10-CM

## 2022-03-20 DIAGNOSIS — M85852 Other specified disorders of bone density and structure, left thigh: Secondary | ICD-10-CM | POA: Diagnosis not present

## 2022-03-20 DIAGNOSIS — Z1231 Encounter for screening mammogram for malignant neoplasm of breast: Secondary | ICD-10-CM | POA: Insufficient documentation

## 2022-03-29 ENCOUNTER — Ambulatory Visit (HOSPITAL_BASED_OUTPATIENT_CLINIC_OR_DEPARTMENT_OTHER): Payer: PPO

## 2022-03-30 ENCOUNTER — Other Ambulatory Visit (HOSPITAL_BASED_OUTPATIENT_CLINIC_OR_DEPARTMENT_OTHER): Payer: PPO

## 2022-04-16 ENCOUNTER — Other Ambulatory Visit: Payer: Self-pay | Admitting: Family Medicine

## 2022-05-07 ENCOUNTER — Other Ambulatory Visit: Payer: Self-pay | Admitting: Hematology & Oncology

## 2022-05-07 DIAGNOSIS — R7983 Abnormal findings of blood amino-acid level: Secondary | ICD-10-CM

## 2022-05-07 DIAGNOSIS — D529 Folate deficiency anemia, unspecified: Secondary | ICD-10-CM

## 2022-05-07 NOTE — Assessment & Plan Note (Signed)
Supplement and monitor 

## 2022-05-07 NOTE — Assessment & Plan Note (Addendum)
Following with neurology, accompanied by her husband and doing well at home.

## 2022-05-07 NOTE — Assessment & Plan Note (Signed)
Well controlled, no changes to meds. Encouraged heart healthy diet such as the DASH diet and exercise as tolerated.  Consider Tetanus and prevnar 20 COVID booster

## 2022-05-07 NOTE — Assessment & Plan Note (Signed)
hgba1c acceptable, minimize simple carbs. Increase exercise as tolerated.  

## 2022-05-07 NOTE — Assessment & Plan Note (Signed)
Encourage heart healthy diet such as MIND or DASH diet, increase exercise, avoid trans fats, simple carbohydrates and processed foods, consider a krill or fish or flaxseed oil cap daily.  °

## 2022-05-08 ENCOUNTER — Ambulatory Visit (INDEPENDENT_AMBULATORY_CARE_PROVIDER_SITE_OTHER): Payer: PPO | Admitting: Family Medicine

## 2022-05-08 VITALS — BP 124/68 | HR 49 | Temp 97.5°F | Resp 16 | Ht 66.0 in | Wt 142.4 lb

## 2022-05-08 DIAGNOSIS — R7989 Other specified abnormal findings of blood chemistry: Secondary | ICD-10-CM

## 2022-05-08 DIAGNOSIS — E538 Deficiency of other specified B group vitamins: Secondary | ICD-10-CM | POA: Diagnosis not present

## 2022-05-08 DIAGNOSIS — G3109 Other frontotemporal dementia: Secondary | ICD-10-CM | POA: Diagnosis not present

## 2022-05-08 DIAGNOSIS — F0390 Unspecified dementia without behavioral disturbance: Secondary | ICD-10-CM

## 2022-05-08 DIAGNOSIS — E559 Vitamin D deficiency, unspecified: Secondary | ICD-10-CM

## 2022-05-08 DIAGNOSIS — M7918 Myalgia, other site: Secondary | ICD-10-CM

## 2022-05-08 DIAGNOSIS — F02A0 Dementia in other diseases classified elsewhere, mild, without behavioral disturbance, psychotic disturbance, mood disturbance, and anxiety: Secondary | ICD-10-CM

## 2022-05-08 DIAGNOSIS — R739 Hyperglycemia, unspecified: Secondary | ICD-10-CM

## 2022-05-08 DIAGNOSIS — I10 Essential (primary) hypertension: Secondary | ICD-10-CM | POA: Diagnosis not present

## 2022-05-08 DIAGNOSIS — E782 Mixed hyperlipidemia: Secondary | ICD-10-CM | POA: Diagnosis not present

## 2022-05-08 MED ORDER — DONEPEZIL HCL 10 MG PO TABS: 5.0000 mg | ORAL_TABLET | Freq: Every day | ORAL | 0 refills | Status: DC

## 2022-05-08 NOTE — Progress Notes (Addendum)
Subjective:   By signing my name below, I, Jamey Reas, attest that this documentation has been prepared under the direction and in the presence of [name], MD. 05/08/2022   Patient ID: Erika [name], female    DOB: 1945-11-05, 77 y.o.   MRN: [id number]  Chief Complaint  Patient presents with   Follow-up    Follow up    HPI Patient is in today for a follow-up appointment. She is accompanied by her husband during this visit.  Memory Loss She reports having difficulty writing when making cards.   Pain under ribs She has pain under her ribs when she sits.   Weight She does not have an appetite. She reports losing 30 lbs, but has maintained a stable weight trend during her last few visits  Wt Readings from Last 3 Encounters:  05/08/22 142 lb 6.4 oz (64.6 kg)  03/07/22 145 lb 12.8 oz (66.1 kg)  12/26/21 149 lb 2 oz (67.6 kg)   Sleep She has difficulty sleeping and has dreams that wake her up as a result of dementia.   Neurology  She had an appointment with Frann Rider, NP on 12/26/2021. She is requesting a refill for 10 mg  donepezil daily PO. She has an upcoming appointment with neurology.   Hematology  She has an upcoming appointment with hematology.   Dexa Last Dexa completed on 03/20/2022. The BMD measured at Femur Neck Left is 0.705 g/cm2 with a T-score of -2.4. This patient is considered osteopenic according to [facility]) criteria. Follow up in 2 years  Mammogram  Last completed on 03/21/2022. Results are normal.  Immunization She is UTD on the shingles immunization. She is interested in receiving the tetanus and RSV immunizations in the future.       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   Cerebrovascular disease    Chest pain 01/19/2014   Chronic  rhinitis 01/13/2009   Diverticulosis of colon    colonoscopy 04/14/1999   Dry mouth 03/13/2012   Fatigue 08/02/2014   GERD (gastroesophageal reflux disease)    Headache 02/11/2018   History of chicken pox    as a child   History of fibrocystic disease of breast    History of measles    as a child   History of mumps    as a child   Hyperlipidemia, mixed 02/20/2007   Goal LDL < 130 HBP/Pos fm hx   Hypertension    Hypokalemia 06/24/2012   Hyponatremia 08/02/2014   IBS (irritable bowel syndrome) 04/02/2011   IDA (iron deficiency anemia) 12/28/2020   Low vitamin D level 01/09/2016   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   Obstructive sleep apnea 05/23/2017   uses CPAP nightly   Osteopenia after menopause    Overactive bladder 04/02/2011   Pedal edema 05/20/2020   Shoulder pain, bilateral 06/14/2011   Thoracic back pain 07/31/2017   Vitamin B 12 deficiency 10/16/2020    Past Surgical History:  Procedure Laterality Date   BREAST REDUCTION SURGERY Bilateral 1986   BUNIONECTOMY Bilateral 1980   CATARACT EXTRACTION, BILATERAL     COLONOSCOPY  2011   HAMMER TOE SURGERY Right 12/03/2013   foot   MOUTH SURGERY  01/10/2013   negative   TONSILLECTOMY  UPPER GASTROINTESTINAL ENDOSCOPY  2016   RK    Family History  Problem Relation Age of Onset   Stroke Mother 46   Hyperlipidemia Mother    Hypertension Mother    Heart attack Father 54   Hypertension Father    Heart disease Father    Hypertension Brother    Aneurysm Brother        aortic   Hyperlipidemia Brother    Heart disease Brother        aortic aneurysm rupture   Multiple sclerosis Daughter    Alcohol abuse Son    Schizophrenia Son    Bipolar disorder Son    Mental illness Son        schizophrenic, bipolar   Colon cancer Neg Hx    Colon polyps Neg Hx    Esophageal cancer Neg Hx    Rectal cancer Neg Hx    Stomach cancer  Neg Hx    Alzheimer's disease Neg Hx     Social History   Socioeconomic History   Marital status: Married    Spouse name: Not on file   Number of children: 4   Years of education: 14   Highest education level: Associate degree: academic program  Occupational History   Occupation: Retired    Comment: Arboriculturist  Tobacco Use   Smoking status: Never   Smokeless tobacco: Never  Vaping Use   Vaping Use: Never used  Substance and Sexual Activity   Alcohol use: No   Drug use: No   Sexual activity: Not Currently  Other Topics Concern   Not on file  Social History Narrative   4 children 2 are adopted      Lives at home with husband, children live closeby    Right handed   Caffeine: maybe 1-3 "or more" cups/day   Social Determinants of Health   Financial Resource Strain: Low Risk  (02/22/2022)   Overall Financial Resource Strain (CARDIA)    Difficulty of Paying Living Expenses: Not hard at all  Food Insecurity: No Food Insecurity (02/22/2022)   Hunger Vital Sign    Worried About Running Out of Food in the Last Year: Never true    Ran Out of Food in the Last Year: Never true  Transportation Needs: No Transportation Needs (02/22/2022)   PRAPARE - Hydrologist (Medical): No    Lack of Transportation (Non-Medical): No  Physical Activity: Inactive (02/22/2022)   Exercise Vital Sign    Days of Exercise per Week: 0 days    Minutes of Exercise per Session: 0 min  Stress: No Stress Concern Present (02/22/2022)   [place]    Feeling of Stress : Not at all  Social Connections: [place] (02/22/2022)   Social Connection and Isolation Panel [NHANES]    Frequency of Communication with Friends and Family: More than three times a week    Frequency of Social Gatherings with Friends and Family: More than three times a week    Attends Religious Services: More than 4 times per  year    Active Member of [employer] or Organizations: Yes    Attends Archivist Meetings: More than 4 times per year    Marital Status: Married  Human resources officer Violence: Not At Risk (02/22/2022)   Humiliation, Afraid, Rape, and Kick questionnaire    Fear of Current or Ex-Partner: No    Emotionally Abused: No    Physically Abused: No  Sexually Abused: No    Outpatient Medications Prior to Visit  Medication Sig Dispense Refill   amLODipine (NORVASC) 10 MG tablet TAKE 0.5 TABLETS BY MOUTH 2 TIMES DAILY. 90 tablet 1   aspirin EC 81 MG tablet Take 1 tablet (81 mg total) by mouth daily.     folic acid (FOLVITE) 1 MG tablet TAKE 1 TABLET BY MOUTH EVERY DAY 90 tablet 2   furosemide (LASIX) 20 MG tablet Take 0.5 tablets (10 mg total) by mouth daily as needed for edema. 45 tablet 1   metoprolol succinate (TOPROL-XL) 50 MG 24 hr tablet TAKE 1 TABLET BY MOUTH TWICE A DAY WITH OR IMMEDIATELY FOLLOWING A MEAL 180 tablet 1   Multiple Minerals-Vitamins (CITRACAL PLUS PO) Take 1 tablet by mouth 2 (two) times daily.     rosuvastatin (CRESTOR) 20 MG tablet TAKE 1 TABLET BY MOUTH EVERY DAY 90 tablet 1   trospium (SANCTURA) 20 MG tablet Take 1 tablet (20 mg total) by mouth daily. 90 tablet 1   UNABLE TO FIND COGNITIVE TABLET DAILY     valsartan (DIOVAN) 320 MG tablet TAKE 1 TABLET BY MOUTH EVERY DAY 90 tablet 1   donepezil (ARICEPT) 10 MG tablet Take 1 tablet (10 mg total) by mouth at bedtime. 30 tablet 5   Facility-Administered Medications Prior to Visit  Medication Dose Route Frequency Provider Last Rate Last Admin   cyanocobalamin ((VITAMIN B-12)) injection 1,000 mcg  1,000 mcg Intramuscular Q30 days Carollee Herter, Kendrick Fries R, DO   1,000 mcg at 10/27/20 1052    No Known Allergies  Review of Systems  Constitutional:  Positive for weight loss.  Musculoskeletal:        (+) muscle pain under ribs  Psychiatric/Behavioral:  Positive for memory loss. The patient has insomnia.        Objective:     Physical Exam Constitutional:      General: She is not in acute distress.    Appearance: Normal appearance.  HENT:     Head: Normocephalic and atraumatic.     Right Ear: External ear normal.     Left Ear: External ear normal.  Eyes:     Extraocular Movements: Extraocular movements intact.     Pupils: Pupils are equal, round, and reactive to light.  Cardiovascular:     Rate and Rhythm: Normal rate and regular rhythm.     Heart sounds: Normal heart sounds. No murmur heard.    No gallop.  Pulmonary:     Effort: Pulmonary effort is normal. No respiratory distress.     Breath sounds: Normal breath sounds. No wheezing or rales.  Skin:    General: Skin is warm.  Neurological:     Mental Status: She is alert and oriented to person, place, and time.  Psychiatric:        Judgment: Judgment normal.     BP 124/68 (BP Location: Right Arm, Patient Position: Sitting, Cuff Size: Normal)   Pulse (!) 49   Temp (!) 97.5 F (36.4 C) (Oral)   Resp 16   Ht '5\' 6"'$  (1.676 m)   Wt 142 lb 6.4 oz (64.6 kg)   SpO2 98%   BMI 22.98 kg/m  Wt Readings from Last 3 Encounters:  05/08/22 142 lb 6.4 oz (64.6 kg)  03/07/22 145 lb 12.8 oz (66.1 kg)  12/26/21 149 lb 2 oz (67.6 kg)       Assessment & Plan:  Hyperlipidemia, mixed Assessment & Plan: Encourage heart healthy diet such as MIND  or DASH diet, increase exercise, avoid trans fats, simple carbohydrates and processed foods, consider a krill or fish or flaxseed oil cap daily.    Hyperglycemia Assessment & Plan: [id number] acceptable, minimize simple carbs. Increase exercise as tolerated.   Primary hypertension Assessment & Plan: Well controlled, no changes to meds. Encouraged heart healthy diet such as the DASH diet and exercise as tolerated.  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   Major neurocognitive disorder (HCC) -     Donepezil HCl; Take 0.5 tablets (5 mg total) by mouth at bedtime.  Dispense: 45 tablet; Refill: 0  Musculoskeletal pain Assessment & Plan: Roughly over left flank, midaxillary line, upper abd/lower ribs. Pain changes with position changes and is most notable when sitting. Likely musculoskeletal. Encouraged moist heat and gentle stretching as tolerated. May try NSAIDs and prescription meds as directed and report if symptoms worsen or seek immediate care      I, Penni Homans, MD, personally preformed the services described in this documentation.  All medical record entries made by the scribe were at my direction and in my presence.  I have reviewed the chart and discharge instructions (if applicable) and agree that the record reflects my personal performance and is accurate and complete. 05/08/2022  Penni Homans, MD  Lacretia Leigh as a scribe for Penni Homans, MD.,have documented all relevant documentation on the behalf of Penni Homans, MD,as directed by  Penni Homans, MD while in the presence of Penni Homans, MD.

## 2022-05-08 NOTE — Patient Instructions (Addendum)
Magnesium Glycinate 200-400 mg   If sleep does not improve can consider Amitriptyline 10 mg at bedtime. Let us know if you want to try it  Hydrate and get protein several a day   RSV, Respiratory Syncitial Virus vaccination, Arexvy at pharmacy  Tetanus at pharmacy  Insomnia Insomnia is a sleep disorder that makes it difficult to fall asleep or stay asleep. Insomnia can cause fatigue, low energy, difficulty concentrating, mood swings, and poor performance at work or school. There are three different ways to classify insomnia: Difficulty falling asleep. Difficulty staying asleep. Waking up too early in the morning. Any type of insomnia can be long-term (chronic) or short-term (acute). Both are common. Short-term insomnia usually lasts for 3 months or less. Chronic insomnia occurs at least three times a week for longer than 3 months. What are the causes? Insomnia may be caused by another condition, situation, or substance, such as: Having certain mental health conditions, such as anxiety and depression. Using caffeine, alcohol, tobacco, or drugs. Having gastrointestinal conditions, such as gastroesophageal reflux disease (GERD). Having certain medical conditions. These include: Asthma. Alzheimer's disease. Stroke. Chronic pain. An overactive thyroid gland (hyperthyroidism). Other sleep disorders, such as restless legs syndrome and sleep apnea. Menopause. Sometimes, the cause of insomnia may not be known. What increases the risk? Risk factors for insomnia include: Gender. Females are affected more often than males. Age. Insomnia is more common as people get older. Stress and certain medical and mental health conditions. Lack of exercise. Having an irregular work schedule. This may include working night shifts and traveling between different time zones. What are the signs or symptoms? If you have insomnia, the main symptom is having trouble falling asleep or having trouble staying  asleep. This may lead to other symptoms, such as: Feeling tired or having low energy. Feeling nervous about going to sleep. Not feeling rested in the morning. Having trouble concentrating. Feeling irritable, anxious, or depressed. How is this diagnosed? This condition may be diagnosed based on: Your symptoms and medical history. Your health care provider may ask about: Your sleep habits. Any medical conditions you have. Your mental health. A physical exam. How is this treated? Treatment for insomnia depends on the cause. Treatment may focus on treating an underlying condition that is causing the insomnia. Treatment may also include: Medicines to help you sleep. Counseling or therapy. Lifestyle adjustments to help you sleep better. Follow these instructions at home: Eating and drinking  Limit or avoid alcohol, caffeinated beverages, and products that contain nicotine and tobacco, especially close to bedtime. These can disrupt your sleep. Do not eat a large meal or eat spicy foods right before bedtime. This can lead to digestive discomfort that can make it hard for you to sleep. Sleep habits  Keep a sleep diary to help you and your health care provider figure out what could be causing your insomnia. Write down: When you sleep. When you wake up during the night. How well you sleep and how rested you feel the next day. Any side effects of medicines you are taking. What you eat and drink. Make your bedroom a dark, comfortable place where it is easy to fall asleep. Put up shades or blackout curtains to block light from outside. Use a white noise machine to block noise. Keep the temperature cool. Limit screen use before bedtime. This includes: Not watching TV. Not using your smartphone, tablet, or computer. Stick to a routine that includes going to bed and waking up at the same  times every day and night. This can help you fall asleep faster. Consider making a quiet activity, such as  reading, part of your nighttime routine. Try to avoid taking naps during the day so that you sleep better at night. Get out of bed if you are still awake after 15 minutes of trying to sleep. Keep the lights down, but try reading or doing a quiet activity. When you feel sleepy, go back to bed. General instructions Take over-the-counter and prescription medicines only as told by your health care provider. Exercise regularly as told by your health care provider. However, avoid exercising in the hours right before bedtime. Use relaxation techniques to manage stress. Ask your health care provider to suggest some techniques that may work well for you. These may include: Breathing exercises. Routines to release muscle tension. Visualizing peaceful scenes. Make sure that you drive carefully. Do not drive if you feel very sleepy. Keep all follow-up visits. This is important. Contact a health care provider if: You are tired throughout the day. You have trouble in your daily routine due to sleepiness. You continue to have sleep problems, or your sleep problems get worse. Get help right away if: You have thoughts about hurting yourself or someone else. Get help right away if you feel like you may hurt yourself or others, or have thoughts about taking your own life. Go to your nearest emergency room or: Call 911. Call the [facility] at (629) [phone] or 988. This is open 24 hours a day. Text the Crisis Text Line at 450-865-1414. Summary Insomnia is a sleep disorder that makes it difficult to fall asleep or stay asleep. Insomnia can be long-term (chronic) or short-term (acute). Treatment for insomnia depends on the cause. Treatment may focus on treating an underlying condition that is causing the insomnia. Keep a sleep diary to help you and your health care provider figure out what could be causing your insomnia. This information is not intended to replace advice given to you by your  health care provider. Make sure you discuss any questions you have with your health care provider. Document Revised: 01/31/2021 Document Reviewed: 01/31/2021 Elsevier Patient Education  [facility].

## 2022-05-12 DIAGNOSIS — M7918 Myalgia, other site: Secondary | ICD-10-CM | POA: Insufficient documentation

## 2022-05-12 NOTE — Progress Notes (Signed)
Subjective:   By signing my name below, I, Jamey Reas, attest that this documentation has been prepared under the direction and in the presence of [name], MD. 05/08/2022   Patient ID: Erika [name], female    DOB: 1945/12/04, 77 y.o.   MRN: [id number]  Chief Complaint  Patient presents with   Follow-up    Follow up    HPI Patient is in today for a follow-up appointment. She is accompanied by her husband during this visit.  Memory Loss She reports having difficulty writing when making cards.   Pain under ribs She has pain under her ribs when she sits.   Weight She does not have an appetite. She reports losing 30 lbs, but has maintained a stable weight trend during her last few visits  Wt Readings from Last 3 Encounters:  05/08/22 142 lb 6.4 oz (64.6 kg)  03/07/22 145 lb 12.8 oz (66.1 kg)  12/26/21 149 lb 2 oz (67.6 kg)   Sleep She has difficulty sleeping and has dreams that wake her up as a result of dementia.   Neurology  She had an appointment with Frann Rider, NP on 12/26/2021. She is requesting a refill for 10 mg  donepezil daily PO. She has an upcoming appointment with neurology.   Hematology  She has an upcoming appointment with hematology.   Dexa Last Dexa completed on 03/20/2022. The BMD measured at Femur Neck Left is 0.705 g/cm2 with a T-score of -2.4. This patient is considered osteopenic according to [facility]) criteria. Follow up in 2 years  Mammogram  Last completed on 03/21/2022. Results are normal.  Immunization She is UTD on the shingles immunization. She is interested in receiving the tetanus and RSV immunizations in the future.       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   Cerebrovascular disease    Chest pain 01/19/2014   Chronic  rhinitis 01/13/2009   Diverticulosis of colon    colonoscopy 04/14/1999   Dry mouth 03/13/2012   Fatigue 08/02/2014   GERD (gastroesophageal reflux disease)    Headache 02/11/2018   History of chicken pox    as a child   History of fibrocystic disease of breast    History of measles    as a child   History of mumps    as a child   Hyperlipidemia, mixed 02/20/2007   Goal LDL < 130 HBP/Pos fm hx   Hypertension    Hypokalemia 06/24/2012   Hyponatremia 08/02/2014   IBS (irritable bowel syndrome) 04/02/2011   IDA (iron deficiency anemia) 12/28/2020   Low vitamin D level 01/09/2016   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   Obstructive sleep apnea 05/23/2017   uses CPAP nightly   Osteopenia after menopause    Overactive bladder 04/02/2011   Pedal edema 05/20/2020   Shoulder pain, bilateral 06/14/2011   Thoracic back pain 07/31/2017   Vitamin B 12 deficiency 10/16/2020    Past Surgical History:  Procedure Laterality Date   BREAST REDUCTION SURGERY Bilateral 1986   BUNIONECTOMY Bilateral 1980   CATARACT EXTRACTION, BILATERAL     COLONOSCOPY  2011   HAMMER TOE SURGERY Right 12/03/2013   foot   MOUTH SURGERY  01/10/2013   negative   TONSILLECTOMY  UPPER GASTROINTESTINAL ENDOSCOPY  2016   RK    Family History  Problem Relation Age of Onset   Stroke Mother 55   Hyperlipidemia Mother    Hypertension Mother    Heart attack Father 38   Hypertension Father    Heart disease Father    Hypertension Brother    Aneurysm Brother        aortic   Hyperlipidemia Brother    Heart disease Brother        aortic aneurysm rupture   Multiple sclerosis Daughter    Alcohol abuse Son    Schizophrenia Son    Bipolar disorder Son    Mental illness Son        schizophrenic, bipolar   Colon cancer Neg Hx    Colon polyps Neg Hx    Esophageal cancer Neg Hx    Rectal cancer Neg Hx    Stomach cancer  Neg Hx    Alzheimer's disease Neg Hx     Social History   Socioeconomic History   Marital status: Married    Spouse name: Not on file   Number of children: 4   Years of education: 14   Highest education level: Associate degree: academic program  Occupational History   Occupation: Retired    Comment: Arboriculturist  Tobacco Use   Smoking status: Never   Smokeless tobacco: Never  Vaping Use   Vaping Use: Never used  Substance and Sexual Activity   Alcohol use: No   Drug use: No   Sexual activity: Not Currently  Other Topics Concern   Not on file  Social History Narrative   4 children 2 are adopted      Lives at home with husband, children live closeby    Right handed   Caffeine: maybe 1-3 "or more" cups/day   Social Determinants of Health   Financial Resource Strain: Low Risk  (02/22/2022)   Overall Financial Resource Strain (CARDIA)    Difficulty of Paying Living Expenses: Not hard at all  Food Insecurity: No Food Insecurity (02/22/2022)   Hunger Vital Sign    Worried About Running Out of Food in the Last Year: Never true    Ran Out of Food in the Last Year: Never true  Transportation Needs: No Transportation Needs (02/22/2022)   PRAPARE - Hydrologist (Medical): No    Lack of Transportation (Non-Medical): No  Physical Activity: Inactive (02/22/2022)   Exercise Vital Sign    Days of Exercise per Week: 0 days    Minutes of Exercise per Session: 0 min  Stress: No Stress Concern Present (02/22/2022)   [place]    Feeling of Stress : Not at all  Social Connections: [place] (02/22/2022)   Social Connection and Isolation Panel [NHANES]    Frequency of Communication with Friends and Family: More than three times a week    Frequency of Social Gatherings with Friends and Family: More than three times a week    Attends Religious Services: More than 4 times per  year    Active Member of [employer] or Organizations: Yes    Attends Archivist Meetings: More than 4 times per year    Marital Status: Married  Human resources officer Violence: Not At Risk (02/22/2022)   Humiliation, Afraid, Rape, and Kick questionnaire    Fear of Current or Ex-Partner: No    Emotionally Abused: No    Physically Abused: No  Sexually Abused: No    Outpatient Medications Prior to Visit  Medication Sig Dispense Refill   amLODipine (NORVASC) 10 MG tablet TAKE 0.5 TABLETS BY MOUTH 2 TIMES DAILY. 90 tablet 1   aspirin EC 81 MG tablet Take 1 tablet (81 mg total) by mouth daily.     folic acid (FOLVITE) 1 MG tablet TAKE 1 TABLET BY MOUTH EVERY DAY 90 tablet 2   furosemide (LASIX) 20 MG tablet Take 0.5 tablets (10 mg total) by mouth daily as needed for edema. 45 tablet 1   metoprolol succinate (TOPROL-XL) 50 MG 24 hr tablet TAKE 1 TABLET BY MOUTH TWICE A DAY WITH OR IMMEDIATELY FOLLOWING A MEAL 180 tablet 1   Multiple Minerals-Vitamins (CITRACAL PLUS PO) Take 1 tablet by mouth 2 (two) times daily.     rosuvastatin (CRESTOR) 20 MG tablet TAKE 1 TABLET BY MOUTH EVERY DAY 90 tablet 1   trospium (SANCTURA) 20 MG tablet Take 1 tablet (20 mg total) by mouth daily. 90 tablet 1   UNABLE TO FIND COGNITIVE TABLET DAILY     valsartan (DIOVAN) 320 MG tablet TAKE 1 TABLET BY MOUTH EVERY DAY 90 tablet 1   donepezil (ARICEPT) 10 MG tablet Take 1 tablet (10 mg total) by mouth at bedtime. 30 tablet 5   Facility-Administered Medications Prior to Visit  Medication Dose Route Frequency Provider Last Rate Last Admin   cyanocobalamin ((VITAMIN B-12)) injection 1,000 mcg  1,000 mcg Intramuscular Q30 days Carollee Herter, Kendrick Fries R, DO   1,000 mcg at 10/27/20 1052    No Known Allergies  Review of Systems  Constitutional:  Positive for weight loss.  Musculoskeletal:        (+) muscle pain under ribs  Psychiatric/Behavioral:  Positive for memory loss. The patient has insomnia.        Objective:     Physical Exam Constitutional:      General: She is not in acute distress.    Appearance: Normal appearance.  HENT:     Head: Normocephalic and atraumatic.     Right Ear: External ear normal.     Left Ear: External ear normal.  Eyes:     Extraocular Movements: Extraocular movements intact.     Pupils: Pupils are equal, round, and reactive to light.  Cardiovascular:     Rate and Rhythm: Normal rate and regular rhythm.     Heart sounds: Normal heart sounds. No murmur heard.    No gallop.  Pulmonary:     Effort: Pulmonary effort is normal. No respiratory distress.     Breath sounds: Normal breath sounds. No wheezing or rales.  Skin:    General: Skin is warm.  Neurological:     Mental Status: She is alert and oriented to person, place, and time.  Psychiatric:        Judgment: Judgment normal.     BP 124/68 (BP Location: Right Arm, Patient Position: Sitting, Cuff Size: Normal)   Pulse (!) 49   Temp (!) 97.5 F (36.4 C) (Oral)   Resp 16   Ht '5\' 6"'$  (1.676 m)   Wt 142 lb 6.4 oz (64.6 kg)   SpO2 98%   BMI 22.98 kg/m  Wt Readings from Last 3 Encounters:  05/08/22 142 lb 6.4 oz (64.6 kg)  03/07/22 145 lb 12.8 oz (66.1 kg)  12/26/21 149 lb 2 oz (67.6 kg)       Assessment & Plan:  Hyperlipidemia, mixed Assessment & Plan: Encourage heart healthy diet such as MIND  or DASH diet, increase exercise, avoid trans fats, simple carbohydrates and processed foods, consider a krill or fish or flaxseed oil cap daily.    Hyperglycemia Assessment & Plan: hgba1c acceptable, minimize simple carbs. Increase exercise as tolerated.   Primary hypertension Assessment & Plan: Well controlled, no changes to meds. Encouraged heart healthy diet such as the DASH diet and exercise as tolerated.  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   Major neurocognitive disorder (HCC) -     Donepezil HCl; Take 0.5 tablets (5 mg total) by mouth at bedtime.  Dispense: 45 tablet; Refill: 0  Musculoskeletal pain Assessment & Plan: Roughly over left flank, midaxillary line, upper abd/lower ribs. Pain changes with position changes and is most notable when sitting. Likely musculoskeletal. Encouraged moist heat and gentle stretching as tolerated. May try NSAIDs and prescription meds as directed and report if symptoms worsen or seek immediate care    Vitamin D deficiency Assessment & Plan: Supplement and monitor      I, Penni Homans, MD, personally preformed the services described in this documentation.  All medical record entries made by the scribe were at my direction and in my presence.  I have reviewed the chart and discharge instructions (if applicable) and agree that the record reflects my personal performance and is accurate and complete. 05/08/2022  Penni Homans, MD  Lacretia Leigh as a scribe for Penni Homans, MD.,have documented all relevant documentation on the behalf of Penni Homans, MD,as directed by  Penni Homans, MD while in the presence of Penni Homans, MD.

## 2022-05-12 NOTE — Assessment & Plan Note (Signed)
Roughly over left flank, midaxillary line, upper abd/lower ribs. Pain changes with position changes and is most notable when sitting. Likely musculoskeletal. Encouraged moist heat and gentle stretching as tolerated. May try NSAIDs and prescription meds as directed and report if symptoms worsen or seek immediate care

## 2022-05-15 DIAGNOSIS — E559 Vitamin D deficiency, unspecified: Secondary | ICD-10-CM | POA: Insufficient documentation

## 2022-05-15 NOTE — Assessment & Plan Note (Signed)
Supplement and monitor 

## 2022-05-29 ENCOUNTER — Encounter: Payer: Self-pay | Admitting: Family

## 2022-05-29 ENCOUNTER — Inpatient Hospital Stay: Payer: PPO | Attending: Hematology & Oncology

## 2022-05-29 ENCOUNTER — Inpatient Hospital Stay (HOSPITAL_BASED_OUTPATIENT_CLINIC_OR_DEPARTMENT_OTHER): Payer: PPO | Admitting: Family

## 2022-05-29 VITALS — BP 151/58 | HR 52 | Temp 98.8°F | Resp 17 | Wt 139.1 lb

## 2022-05-29 DIAGNOSIS — R413 Other amnesia: Secondary | ICD-10-CM | POA: Diagnosis not present

## 2022-05-29 DIAGNOSIS — R7983 Abnormal findings of blood amino-acid level: Secondary | ICD-10-CM

## 2022-05-29 DIAGNOSIS — D509 Iron deficiency anemia, unspecified: Secondary | ICD-10-CM

## 2022-05-29 DIAGNOSIS — Z79899 Other long term (current) drug therapy: Secondary | ICD-10-CM | POA: Insufficient documentation

## 2022-05-29 DIAGNOSIS — F410 Panic disorder [episodic paroxysmal anxiety] without agoraphobia: Secondary | ICD-10-CM | POA: Insufficient documentation

## 2022-05-29 DIAGNOSIS — R410 Disorientation, unspecified: Secondary | ICD-10-CM | POA: Insufficient documentation

## 2022-05-29 DIAGNOSIS — Z7982 Long term (current) use of aspirin: Secondary | ICD-10-CM | POA: Insufficient documentation

## 2022-05-29 DIAGNOSIS — E538 Deficiency of other specified B group vitamins: Secondary | ICD-10-CM | POA: Diagnosis not present

## 2022-05-29 LAB — RETICULOCYTES
Immature Retic Fract: 6.9 % (ref 2.3–15.9)
RBC.: 4.24 MIL/uL (ref 3.87–5.11)
Retic Count, Absolute: 60.2 10*3/uL (ref 19.0–186.0)
Retic Ct Pct: 1.4 % (ref 0.4–3.1)

## 2022-05-29 LAB — CBC WITH DIFFERENTIAL (CANCER CENTER ONLY)
Abs Immature Granulocytes: 0.01 10*3/uL (ref 0.00–0.07)
Basophils Absolute: 0.1 10*3/uL (ref 0.0–0.1)
Basophils Relative: 2 %
Eosinophils Absolute: 0.2 10*3/uL (ref 0.0–0.5)
Eosinophils Relative: 3 %
HCT: 39.6 % (ref 36.0–46.0)
Hemoglobin: 13.7 g/dL (ref 12.0–15.0)
Immature Granulocytes: 0 %
Lymphocytes Relative: 30 %
Lymphs Abs: 1.7 10*3/uL (ref 0.7–4.0)
MCH: 32.5 pg (ref 26.0–34.0)
MCHC: 34.6 g/dL (ref 30.0–36.0)
MCV: 93.8 fL (ref 80.0–100.0)
Monocytes Absolute: 0.6 10*3/uL (ref 0.1–1.0)
Monocytes Relative: 10 %
Neutro Abs: 3.1 10*3/uL (ref 1.7–7.7)
Neutrophils Relative %: 55 %
Platelet Count: 224 10*3/uL (ref 150–400)
RBC: 4.22 MIL/uL (ref 3.87–5.11)
RDW: 11.4 % — ABNORMAL LOW (ref 11.5–15.5)
WBC Count: 5.6 10*3/uL (ref 4.0–10.5)
nRBC: 0 % (ref 0.0–0.2)

## 2022-05-29 LAB — IRON AND IRON BINDING CAPACITY (CC-WL,HP ONLY)
Iron: 115 ug/dL (ref 28–170)
Saturation Ratios: 32 % — ABNORMAL HIGH (ref 10.4–31.8)
TIBC: 361 ug/dL (ref 250–450)
UIBC: 246 ug/dL (ref 148–442)

## 2022-05-29 LAB — FERRITIN: Ferritin: 34 ng/mL (ref 11–307)

## 2022-05-29 NOTE — Progress Notes (Signed)
Hematology and Oncology Follow Up Visit  Erika [name] [id number] 09/22/1945 77 y.o. 05/29/2022   Principle Diagnosis:  Iron deficiency anemia  Elevated homocystine level    Current Therapy:        IV iron as indicated  Folic acid 1 mg PO Daily   Interim History:  Erika [name] is here today for follow-up. She is doing fairly well. Her biggest issue at this time is her memory loss. She is on Aricept and tolerating this well.  She has some mild confusion at times and also has some panic attacks in the car. She states that her husband is good at reassuring her and helping her work through the anxiety.  She is also sleeping better on medication.  No fever, chills, n/v, cough, rash, dizziness, SOB, chest pain, palpitations, abdominal pain or changes in bowel or bladder habits at this time.  No blood loss noted. No bruising or petechiae.  No swelling, tenderness, numbness or tingling in her extremities.  No falls or syncope.  Appetite and hydration are fair. She is really working hard on eating well throughout the day. Her family is encouraging her. Her weight is 139 lbs.   ECOG Performance Status: 1 - Symptomatic but completely ambulatory  Medications:  Allergies as of 05/29/2022   No Known Allergies      Medication List        Accurate as of May 29, 2022  9:50 AM. If you have any questions, ask your nurse or doctor.          amLODipine 10 MG tablet Commonly known as: NORVASC TAKE 0.5 TABLETS BY MOUTH 2 TIMES DAILY.   aspirin EC 81 MG tablet Take 1 tablet (81 mg total) by mouth daily.   CITRACAL PLUS PO Take 1 tablet by mouth 2 (two) times daily.   donepezil 10 MG tablet Commonly known as: ARICEPT Take 0.5 tablets (5 mg total) by mouth at bedtime.   folic acid 1 MG tablet Commonly known as: FOLVITE TAKE 1 TABLET BY MOUTH EVERY DAY   furosemide 20 MG tablet Commonly known as: LASIX Take 0.5 tablets (10 mg total) by mouth daily as needed for edema.    metoprolol succinate 50 MG 24 hr tablet Commonly known as: TOPROL-XL TAKE 1 TABLET BY MOUTH TWICE A DAY WITH OR IMMEDIATELY FOLLOWING A MEAL   rosuvastatin 20 MG tablet Commonly known as: CRESTOR TAKE 1 TABLET BY MOUTH EVERY DAY   trospium 20 MG tablet Commonly known as: SANCTURA Take 1 tablet (20 mg total) by mouth daily.   UNABLE TO FIND COGNITIVE TABLET DAILY   valsartan 320 MG tablet Commonly known as: DIOVAN TAKE 1 TABLET BY MOUTH EVERY DAY        Allergies: No Known Allergies  Past Medical History, Surgical history, Social history, and Family History were reviewed and updated.  Review of Systems: All other 10 point review of systems is negative.   Physical Exam:  vitals were not taken for this visit.   Wt Readings from Last 3 Encounters:  05/08/22 142 lb 6.4 oz (64.6 kg)  03/07/22 145 lb 12.8 oz (66.1 kg)  12/26/21 149 lb 2 oz (67.6 kg)    Ocular: Sclerae unicteric, pupils equal, round and reactive to light Ear-nose-throat: Oropharynx clear, dentition fair Lymphatic: No cervical or supraclavicular adenopathy Lungs no rales or rhonchi, good excursion bilaterally Heart regular rate and rhythm, no murmur appreciated Abd soft, nontender, positive bowel sounds MSK no focal spinal tenderness, no joint edema  Neuro: non-focal, well-oriented, appropriate affect Breasts: Deferred   Lab Results  Component Value Date   WBC 6.2 03/07/2022   HGB 14.8 03/07/2022   HCT 41.7 03/07/2022   MCV 93.4 03/07/2022   PLT 248.0 03/07/2022   Lab Results  Component Value Date   FERRITIN 49 11/28/2021   IRON 109 11/28/2021   TIBC 367 11/28/2021   UIBC 258 11/28/2021   IRONPCTSAT 30 11/28/2021   Lab Results  Component Value Date   RETICCTPCT 1.7 11/28/2021   RBC 4.47 03/07/2022   No results found for: "KPAFRELGTCHN", "LAMBDASER", "KAPLAMBRATIO" No results found for: "IGGSERUM", "IGA", "IGMSERUM" No results found for: "TOTALPROTELP", "ALBUMINELP", "A1GS", "A2GS",  "BETS", "BETA2SER", "GAMS", "MSPIKE", "SPEI"   Chemistry      Component Value Date/Time   NA 142 03/07/2022 1026   K 4.1 03/07/2022 1026   CL 103 03/07/2022 1026   CO2 30 03/07/2022 1026   BUN 15 03/07/2022 1026   CREATININE 0.96 03/07/2022 1026   CREATININE 1.19 (H) 12/28/2020 0846   CREATININE 1.23 (H) 11/18/2019 0932      Component Value Date/Time   CALCIUM 10.2 03/07/2022 1026   ALKPHOS 62 03/07/2022 1026   AST 17 03/07/2022 1026   AST 15 12/28/2020 0846   ALT 12 03/07/2022 1026   ALT 11 12/28/2020 0846   BILITOT 0.8 03/07/2022 1026   BILITOT 0.7 12/28/2020 0846       Impression and Plan: Erika [name] is a very pleasant 77 yo caucasian female with history of multifactorial anemia including iron deficiency and B 12 deficiency (managed by PCP). She also had a slightly elevated homocystine level. Folic acid PO daily.  Iron studies are pending. We will replace if needed.  Follow-up in 6 months.   [name], NP 3/25/20249:50 AM

## 2022-05-31 LAB — HOMOCYSTEINE: Homocysteine: 10.9 umol/L (ref 0.0–19.2)

## 2022-06-05 ENCOUNTER — Encounter (HOSPITAL_BASED_OUTPATIENT_CLINIC_OR_DEPARTMENT_OTHER): Payer: Self-pay | Admitting: Pulmonary Disease

## 2022-06-05 ENCOUNTER — Ambulatory Visit (INDEPENDENT_AMBULATORY_CARE_PROVIDER_SITE_OTHER): Payer: PPO | Admitting: Pulmonary Disease

## 2022-06-05 VITALS — BP 134/60 | HR 52 | Temp 98.5°F | Ht 66.0 in | Wt 133.4 lb

## 2022-06-05 DIAGNOSIS — G4733 Obstructive sleep apnea (adult) (pediatric): Secondary | ICD-10-CM

## 2022-06-05 NOTE — Patient Instructions (Signed)
CPAP is working well on current settings 

## 2022-06-05 NOTE — Progress Notes (Signed)
   Subjective:    Patient ID: Erika [name], female    DOB: 30-Jun-1945, 77 y.o.   MRN: [id number]  HPI  77  yo for FU of mild obstructive sleep apnea   PMH -refractory hypertension Alzheimer's  Annual follow-up visit. Accompanied by her daughter Erika [name].  Memory appears to be worse over the past year. She has lost 30 pounds from 165 to 133 pounds, workup by PCP includes normal TSH. She reports increased sleepiness in the daytime. She continues on amlodipine, valsartan and metoprolol for blood pressure She is tolerating CPAP well and denies any problems with mask or pressure she is confused as to instructions what should she be doing to take care of the machine  Significant tests/ events reviewed   HST 3/ 2020 showed mild sleep apnea with AHI 11/hour    Review of Systems neg for any significant sore throat, dysphagia, itching, sneezing, nasal congestion or excess/ purulent secretions, fever, chills, sweats, unintended wt loss, pleuritic or exertional cp, hempoptysis, orthopnea pnd or change in chronic leg swelling. Also denies presyncope, palpitations, heartburn, abdominal pain, nausea, vomiting, diarrhea or change in bowel or urinary habits, dysuria,hematuria, rash, arthralgias, visual complaints, headache, numbness weakness or ataxia.     Objective:   Physical Exam  Gen. Pleasant, well-nourished, in no distress ENT - no thrush, no pallor/icterus,no post nasal drip Neck: No JVD, no thyromegaly, no carotid bruits Lungs: no use of accessory muscles, no dullness to percussion, clear without rales or rhonchi  Cardiovascular: Rhythm regular, heart sounds  normal, no murmurs or gallops, no peripheral edema Musculoskeletal: No deformities, no cyanosis or clubbing        Assessment & Plan:

## 2022-06-05 NOTE — Assessment & Plan Note (Signed)
CPAP download was reviewed which shows excellent control of events on auto settings 10 to 15 cm with average pressure of 11 and maximum pressure of 12 cm.  She is very compliant with mild leak, she has settled down with nasal pillows and tolerates this well.  CPAP is overall helped improve her daytime somnolence and fatigue. She only had mild OSA to begin with.It is possible that OSA is resolved with a 30 pound weight loss however she continues to be symptomatic with hypersomnolence.  It is also likely that the hypersomnolence is related to her neurocognitive decline.  As such we decided to continue CPAP for now.  Clearly if this becomes cumbersome in the future we can consider discontinuing CPAP therapy

## 2022-06-06 ENCOUNTER — Other Ambulatory Visit: Payer: Self-pay | Admitting: Family Medicine

## 2022-06-15 ENCOUNTER — Ambulatory Visit: Payer: PPO | Admitting: Adult Health

## 2022-07-11 ENCOUNTER — Ambulatory Visit: Payer: PPO | Admitting: Neurology

## 2022-08-03 ENCOUNTER — Other Ambulatory Visit: Payer: Self-pay | Admitting: Family Medicine

## 2022-08-03 DIAGNOSIS — F0390 Unspecified dementia without behavioral disturbance: Secondary | ICD-10-CM

## 2022-08-09 NOTE — Assessment & Plan Note (Signed)
Supplement and monitor 

## 2022-08-09 NOTE — Assessment & Plan Note (Signed)
Well controlled, no changes to meds. Encouraged heart healthy diet such as the DASH diet and exercise as tolerated.  °

## 2022-08-09 NOTE — Assessment & Plan Note (Signed)
Encouraged to get adequate exercise, calcium and vitamin d intake 

## 2022-08-09 NOTE — Assessment & Plan Note (Signed)
Encourage heart healthy diet such as MIND or DASH diet, increase exercise, avoid trans fats, simple carbohydrates and processed foods, consider a krill or fish or flaxseed oil cap daily.  °

## 2022-08-09 NOTE — Assessment & Plan Note (Signed)
hgba1c acceptable, minimize simple carbs. Increase exercise as tolerated.  

## 2022-08-10 ENCOUNTER — Ambulatory Visit (INDEPENDENT_AMBULATORY_CARE_PROVIDER_SITE_OTHER): Payer: PPO | Admitting: Family Medicine

## 2022-08-10 ENCOUNTER — Telehealth: Payer: Self-pay | Admitting: Cardiology

## 2022-08-10 VITALS — BP 124/62 | HR 50 | Temp 97.5°F | Ht 64.0 in | Wt 135.8 lb

## 2022-08-10 DIAGNOSIS — R739 Hyperglycemia, unspecified: Secondary | ICD-10-CM | POA: Diagnosis not present

## 2022-08-10 DIAGNOSIS — R0789 Other chest pain: Secondary | ICD-10-CM | POA: Diagnosis not present

## 2022-08-10 DIAGNOSIS — E538 Deficiency of other specified B group vitamins: Secondary | ICD-10-CM | POA: Diagnosis not present

## 2022-08-10 DIAGNOSIS — M7918 Myalgia, other site: Secondary | ICD-10-CM | POA: Diagnosis not present

## 2022-08-10 DIAGNOSIS — E782 Mixed hyperlipidemia: Secondary | ICD-10-CM | POA: Diagnosis not present

## 2022-08-10 DIAGNOSIS — R7989 Other specified abnormal findings of blood chemistry: Secondary | ICD-10-CM

## 2022-08-10 DIAGNOSIS — E559 Vitamin D deficiency, unspecified: Secondary | ICD-10-CM

## 2022-08-10 DIAGNOSIS — M8580 Other specified disorders of bone density and structure, unspecified site: Secondary | ICD-10-CM | POA: Diagnosis not present

## 2022-08-10 DIAGNOSIS — Z780 Asymptomatic menopausal state: Secondary | ICD-10-CM

## 2022-08-10 DIAGNOSIS — I10 Essential (primary) hypertension: Secondary | ICD-10-CM | POA: Diagnosis not present

## 2022-08-10 LAB — LIPID PANEL
Cholesterol: 147 mg/dL (ref 0–200)
HDL: 44.6 mg/dL (ref >39.00)
LDL Cholesterol: 78 mg/dL (ref 0–99)
NonHDL: 102.73
Total CHOL/HDL Ratio: 3
Triglycerides: 125 mg/dL (ref 0.0–149.0)
VLDL: 25 mg/dL (ref 0.0–40.0)

## 2022-08-10 LAB — CBC WITH DIFFERENTIAL/PLATELET
Basophils Absolute: 0.1 10*3/uL (ref 0.0–0.1)
Basophils Relative: 1.7 % (ref 0.0–3.0)
Eosinophils Absolute: 0.2 10*3/uL (ref 0.0–0.7)
Eosinophils Relative: 2.4 % (ref 0.0–5.0)
HCT: 41.9 % (ref 36.0–46.0)
Hemoglobin: 14.1 g/dL (ref 12.0–15.0)
Lymphocytes Relative: 31.7 % (ref 12.0–46.0)
Lymphs Abs: 2.1 10*3/uL (ref 0.7–4.0)
MCHC: 33.7 g/dL (ref 30.0–36.0)
MCV: 95.2 fl (ref 78.0–100.0)
Monocytes Absolute: 0.7 10*3/uL (ref 0.1–1.0)
Monocytes Relative: 10 % (ref 3.0–12.0)
Neutro Abs: 3.6 10*3/uL (ref 1.4–7.7)
Neutrophils Relative %: 54.2 % (ref 43.0–77.0)
Platelets: 230 10*3/uL (ref 150.0–400.0)
RBC: 4.4 Mil/uL (ref 3.87–5.11)
RDW: 11.9 % (ref 11.5–15.5)
WBC: 6.7 10*3/uL (ref 4.0–10.5)

## 2022-08-10 LAB — COMPREHENSIVE METABOLIC PANEL
ALT: 11 U/L (ref 0–35)
AST: 16 U/L (ref 0–37)
Albumin: 4.2 g/dL (ref 3.5–5.2)
Alkaline Phosphatase: 58 U/L (ref 39–117)
BUN: 20 mg/dL (ref 6–23)
CO2: 31 mEq/L (ref 19–32)
Calcium: 9.8 mg/dL (ref 8.4–10.5)
Chloride: 102 mEq/L (ref 96–112)
Creatinine, Ser: 1 mg/dL (ref 0.40–1.20)
GFR: 54.7 mL/min — ABNORMAL LOW (ref >60.00)
Glucose, Bld: 87 mg/dL (ref 70–99)
Potassium: 3.9 mEq/L (ref 3.5–5.1)
Sodium: 143 mEq/L (ref 135–145)
Total Bilirubin: 1 mg/dL (ref 0.2–1.2)
Total Protein: 7 g/dL (ref 6.0–8.3)

## 2022-08-10 LAB — VITAMIN B12: Vitamin B-12: 217 pg/mL (ref 211–911)

## 2022-08-10 LAB — HEMOGLOBIN A1C: Hgb A1c MFr Bld: 5.2 % (ref 4.6–6.5)

## 2022-08-10 LAB — TSH: TSH: 1.48 u[IU]/mL (ref 0.35–5.50)

## 2022-08-10 NOTE — Patient Instructions (Signed)
Managing Anxiety, Adult After being diagnosed with anxiety, you may be relieved to know why you have felt or behaved a certain way. You may also feel overwhelmed about the treatment ahead and what it will mean for your life. With care and support, you can manage your anxiety. How to manage lifestyle changes Understanding the difference between stress and anxiety Although stress can play a role in anxiety, it is not the same as anxiety. Stress is your body's reaction to life changes and events, both good and bad. Stress is often caused by something external, such as a deadline, test, or competition. It normally goes away after the event has ended and will last just a few hours. But, stress can be ongoing and can lead to more than just stress. Anxiety is caused by something internal, such as imagining a terrible outcome or worrying that something will go wrong that will greatly upset you. Anxiety often does not go away even after the event is over, and it can become a long-term (chronic) worry. Lowering stress and anxiety Talk with your health care provider or a counselor to learn more about lowering anxiety and stress. They may suggest tension-reduction techniques, such as: Music. Spend time creating or listening to music that you enjoy and that inspires you. Mindfulness-based meditation. Practice being aware of your normal breaths while not trying to control your breathing. It can be done while sitting or walking. Centering prayer. Focus on a word, phrase, or sacred image that means something to you and brings you peace. Deep breathing. Expand your stomach and inhale slowly through your nose. Hold your breath for 3-5 seconds. Then breathe out slowly, letting your stomach muscles relax. Self-talk. Learn to notice and spot thought patterns that lead to anxiety reactions. Change those patterns to thoughts that feel peaceful. Muscle relaxation. Take time to tense muscles and then relax them. Choose a  tension-reduction technique that fits your lifestyle and personality. These techniques take time and practice. Set aside 5-15 minutes a day to do them. Specialized therapists can offer counseling and training in these techniques. The training to help with anxiety may be covered by some insurance plans. Other things you can do to manage stress and anxiety include: Keeping a stress diary. This can help you learn what triggers your reaction and then learn ways to manage your response. Thinking about how you react to certain situations. You may not be able to control everything, but you can control your response. Making time for activities that help you relax and not feeling guilty about spending your time in this way. Doing visual imagery. This involves imagining or creating mental pictures to help you relax. Practicing yoga. Through yoga poses, you can lower tension and relax.  Medicines Medicines for anxiety include: Antidepressant medicines. These are usually prescribed for long-term daily control. Anti-anxiety medicines. These may be added in severe cases, especially when panic attacks occur. When used together, medicines, psychotherapy, and tension-reduction techniques may be the most effective treatment. Relationships Relationships can play a big part in helping you recover. Spend more time connecting with trusted friends and family members. Think about going to couples counseling if you have a partner, taking family education classes, or going to family therapy. Therapy can help you and others better understand your anxiety. How to recognize changes in your anxiety Everyone responds differently to treatment for anxiety. Recovery from anxiety happens when symptoms lessen and stop interfering with your daily life at home or work. This may mean that you   will start to: Have better concentration and focus. Worry will interfere less in your daily thinking. Sleep better. Be less irritable. Have more  energy. Have improved memory. Try to recognize when your condition is getting worse. Contact your provider if your symptoms interfere with home or work and you feel like your condition is not improving. Follow these instructions at home: Activity Exercise. Adults should: Exercise for at least 150 minutes each week. The exercise should increase your heart rate and make you sweat (moderate-intensity exercise). Do strengthening exercises at least twice a week. Get the right amount and quality of sleep. Most adults need 7-9 hours of sleep each night. Lifestyle  Eat a healthy diet that includes plenty of vegetables, fruits, whole grains, low-fat dairy products, and lean protein. Do not eat a lot of foods that are high in fats, added sugars, or salt (sodium). Make choices that simplify your life. Do not use any products that contain nicotine or tobacco. These products include cigarettes, chewing tobacco, and vaping devices, such as e-cigarettes. If you need help quitting, ask your provider. Avoid caffeine, alcohol, and certain over-the-counter cold medicines. These may make you feel worse. Ask your pharmacist which medicines to avoid. General instructions Take over-the-counter and prescription medicines only as told by your provider. Keep all follow-up visits. This is to make sure you are managing your anxiety well or if you need more support. Where to find support You can get help and support from: Self-help groups. Online and community organizations. A trusted spiritual leader. Couples counseling. Family education classes. Family therapy. Where to find more information You may find that joining a support group helps you deal with your anxiety. The following sources can help you find counselors or support groups near you: Mental Health America: mentalhealthamerica.net Anxiety and Depression Association of America (ADAA): adaa.org National Alliance on Mental Illness (NAMI): nami.org Contact  a health care provider if: You have a hard time staying focused or finishing tasks. You spend many hours a day feeling worried about everyday life. You are very tired because you cannot stop worrying. You start to have headaches or often feel tense. You have chronic nausea or diarrhea. Get help right away if: Your heart feels like it is racing. You have shortness of breath. You have thoughts of hurting yourself or others. Get help right away if you feel like you may hurt yourself or others, or have thoughts about taking your own life. Go to your nearest emergency room or: Call 911. Call the National Suicide Prevention Lifeline at 1-800-273-8255 or 988. This is open 24 hours a day. Text the Crisis Text Line at 741741. This information is not intended to replace advice given to you by your health care provider. Make sure you discuss any questions you have with your health care provider. Document Revised: 11/29/2021 Document Reviewed: 06/13/2020 Elsevier Patient Education  2024 Elsevier Inc.  

## 2022-08-10 NOTE — Telephone Encounter (Signed)
Pt scheduled for 08/23/22

## 2022-08-10 NOTE — Progress Notes (Signed)
Subjective:   By signing my name below, I, Vickey Sages, attest that this documentation has been prepared under the direction and in the presence of Bradd Canary, MD., 08/10/2022.   Patient ID: Erika [name], female    DOB: 08-08-1945, 77 y.o.   MRN: [id number]  Chief Complaint  Patient presents with   Follow-up    Follow up   HPI Patient is in today for an office visit and is accompanied by her husband. She denies recent hospitalization, febrile illness, palpitations/SOB/HA/fever/chills/GI or GU symptoms.  Bilateral Shoulder Pain Patient reports that she has been experiencing bilateral shoulder pain that worsens with certain arm movements. She has been managing this pain with Tylenol and Voltaren gel as needed, which provide temporary relief. The pain is not disrupting her sleep, but does make it difficult to perform daily tasks. Additionally, she complains of being cold constantly, which worsens her joint pain.  Headaches Patient reports that she is still experiencing occasional headaches, but the quality of these have improved and are manageable. She is taking Tylenol as needed and hydrating.  Increasing Anxiety and Chest Pain Patient reports that she has been experiencing increased anxiety and stress, which have been leading to episodes of mild chest pain. These episodes occur approximately every two weeks and last several minutes before subsiding. She denies nausea, sweatiness, or shortness of breath during these episodes. She was seen by cardiologist Dr. Norman Herrlich in 07/2020 and is interested in following with cardiology again. Additionally, she received a cardiac CT scan in 06/2020 which revealed a coronary calcium score of 1020.  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   Cerebrovascular disease     Chest pain 01/19/2014   Chronic rhinitis 01/13/2009   Diverticulosis of colon    colonoscopy 04/14/1999   Dry mouth 03/13/2012   Fatigue 08/02/2014   GERD (gastroesophageal reflux disease)    Headache 02/11/2018   History of chicken pox    as a child   History of fibrocystic disease of breast    History of measles    as a child   History of mumps    as a child   Hyperlipidemia, mixed 02/20/2007   Goal LDL < 130 HBP/Pos fm hx   Hypertension    Hypokalemia 06/24/2012   Hyponatremia 08/02/2014   IBS (irritable bowel syndrome) 04/02/2011   IDA (iron deficiency anemia) 12/28/2020   Low vitamin D level 01/09/2016   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   Obstructive sleep apnea 05/23/2017   uses CPAP nightly   Osteopenia after menopause    Overactive bladder 04/02/2011   Pedal edema 05/20/2020   Shoulder pain, bilateral 06/14/2011   Thoracic back pain 07/31/2017   Vitamin B 12 deficiency 10/16/2020    Past Surgical History:  Procedure Laterality Date   BREAST REDUCTION SURGERY Bilateral 1986   BUNIONECTOMY Bilateral 1980   CATARACT EXTRACTION, BILATERAL     COLONOSCOPY  2011   HAMMER TOE SURGERY Right 12/03/2013   foot   MOUTH SURGERY  01/10/2013   negative   TONSILLECTOMY     UPPER GASTROINTESTINAL ENDOSCOPY  2016   RK    Family History  Problem Relation Age of Onset   Stroke Mother 80   Hyperlipidemia Mother    Hypertension Mother  Heart attack Father 26   Hypertension Father    Heart disease Father    Hypertension Brother    Aneurysm Brother        aortic   Hyperlipidemia Brother    Heart disease Brother        aortic aneurysm rupture   Multiple sclerosis Daughter    Alcohol abuse Son    Schizophrenia Son    Bipolar disorder Son    Mental illness Son        schizophrenic, bipolar   Colon cancer Neg Hx    Colon polyps Neg Hx    Esophageal cancer Neg Hx    Rectal  cancer Neg Hx    Stomach cancer Neg Hx    Alzheimer's disease Neg Hx     Social History   Socioeconomic History   Marital status: Married    Spouse name: Not on file   Number of children: 4   Years of education: 14   Highest education level: Associate degree: academic program  Occupational History   Occupation: Retired    Comment: Tax inspector  Tobacco Use   Smoking status: Never   Smokeless tobacco: Never  Vaping Use   Vaping Use: Never used  Substance and Sexual Activity   Alcohol use: No   Drug use: No   Sexual activity: Not Currently  Other Topics Concern   Not on file  Social History Narrative   4 children 2 are adopted      Lives at home with husband, children live closeby    Right handed   Caffeine: maybe 1-3 "or more" cups/day   Social Determinants of Health   Financial Resource Strain: Low Risk  (02/22/2022)   Overall Financial Resource Strain (CARDIA)    Difficulty of Paying Living Expenses: Not hard at all  Food Insecurity: No Food Insecurity (02/22/2022)   Hunger Vital Sign    Worried About Running Out of Food in the Last Year: Never true    Ran Out of Food in the Last Year: Never true  Transportation Needs: No Transportation Needs (02/22/2022)   PRAPARE - Administrator, Civil Service (Medical): No    Lack of Transportation (Non-Medical): No  Physical Activity: Inactive (02/22/2022)   Exercise Vital Sign    Days of Exercise per Week: 0 days    Minutes of Exercise per Session: 0 min  Stress: No Stress Concern Present (02/22/2022)   [employer] of Occupational Health - Occupational Stress Questionnaire    Feeling of Stress : Not at all  Social Connections: Socially Integrated (02/22/2022)   Social Connection and Isolation Panel [NHANES]    Frequency of Communication with Friends and Family: More than three times a week    Frequency of Social Gatherings with Friends and Family: More than three times a week    Attends Religious  Services: More than 4 times per year    Active Member of [employer] or Organizations: Yes    Attends Engineer, structural: More than 4 times per year    Marital Status: Married  Catering manager Violence: Not At Risk (02/22/2022)   Humiliation, Afraid, Rape, and Kick questionnaire    Fear of Current or Ex-Partner: No    Emotionally Abused: No    Physically Abused: No    Sexually Abused: No    Outpatient Medications Prior to Visit  Medication Sig Dispense Refill   acetaminophen (TYLENOL) 325 MG tablet Take 325 mg by mouth at bedtime.  amLODipine (NORVASC) 10 MG tablet TAKE 0.5 TABLETS BY MOUTH 2 TIMES DAILY. 90 tablet 1   aspirin EC 81 MG tablet Take 1 tablet (81 mg total) by mouth daily.     donepezil (ARICEPT) 10 MG tablet TAKE 0.5 TABLETS BY MOUTH AT BEDTIME. 45 tablet 1   folic acid (FOLVITE) 1 MG tablet TAKE 1 TABLET BY MOUTH EVERY DAY 90 tablet 2   furosemide (LASIX) 20 MG tablet Take 0.5 tablets (10 mg total) by mouth daily as needed for edema. 45 tablet 1   metoprolol succinate (TOPROL-XL) 50 MG 24 hr tablet TAKE 1 TABLET BY MOUTH TWICE A DAY WITH OR IMMEDIATELY FOLLOWING A MEAL 180 tablet 1   Multiple Minerals-Vitamins (CITRACAL PLUS PO) Take 1 tablet by mouth 2 (two) times daily.     rosuvastatin (CRESTOR) 20 MG tablet TAKE 1 TABLET BY MOUTH EVERY DAY 90 tablet 1   trospium (SANCTURA) 20 MG tablet Take 1 tablet (20 mg total) by mouth daily. 90 tablet 1   UNABLE TO FIND COGNITIVE TABLET DAILY     valsartan (DIOVAN) 320 MG tablet TAKE 1 TABLET BY MOUTH EVERY DAY 90 tablet 1   Facility-Administered Medications Prior to Visit  Medication Dose Route Frequency Provider Last Rate Last Admin   cyanocobalamin ((VITAMIN B-12)) injection 1,000 mcg  1,000 mcg Intramuscular Q30 days Zola Button, Myrene Buddy R, DO   1,000 mcg at 10/27/20 1052    No Known Allergies  Review of Systems  Constitutional:  Negative for chills and fever.  Eyes:  Negative for blurred vision, double vision,  photophobia, pain, discharge and redness.  Respiratory:  Negative for shortness of breath.   Cardiovascular:  Negative for chest pain and palpitations.  Gastrointestinal:  Negative for abdominal pain, blood in stool, constipation, diarrhea, nausea and vomiting.  Genitourinary:  Negative for dysuria, frequency, hematuria and urgency.  Musculoskeletal:  Positive for joint pain (bilateral shoulder pain).  Skin:           Neurological:  Negative for headaches.       Objective:    Physical Exam Constitutional:      General: She is not in acute distress.    Appearance: Normal appearance. She is not ill-appearing.  HENT:     Head: Normocephalic and atraumatic.     Right Ear: External ear normal.     Left Ear: External ear normal.     Nose: Nose normal.     Mouth/Throat:     Mouth: Mucous membranes are moist.     Pharynx: Oropharynx is clear.  Eyes:     General:        Right eye: No discharge.        Left eye: No discharge.     Extraocular Movements: Extraocular movements intact.     Conjunctiva/sclera: Conjunctivae normal.     Pupils: Pupils are equal, round, and reactive to light.  Cardiovascular:     Rate and Rhythm: Normal rate and regular rhythm.     Pulses: Normal pulses.     Heart sounds: Normal heart sounds. No murmur heard.    No gallop.  Pulmonary:     Effort: Pulmonary effort is normal. No respiratory distress.     Breath sounds: Normal breath sounds. No wheezing or rales.  Abdominal:     General: Bowel sounds are normal.     Palpations: Abdomen is soft.     Tenderness: There is no abdominal tenderness. There is no guarding.  Musculoskeletal:  General: Normal range of motion.     Cervical back: Normal range of motion.     Right lower leg: No edema.     Left lower leg: No edema.  Skin:    General: Skin is warm and dry.  Neurological:     Mental Status: She is alert and oriented to person, place, and time.  Psychiatric:        Mood and Affect: Mood  normal.        Behavior: Behavior normal.        Judgment: Judgment normal.     BP 124/62 (BP Location: Left Arm, Patient Position: Sitting, Cuff Size: Normal)   Pulse (!) 50   Temp (!) 97.5 F (36.4 C) (Oral)   Ht 5\' 4"  (1.626 m)   Wt 135 lb 12.8 oz (61.6 kg)   SpO2 95%   BMI 23.31 kg/m  Wt Readings from Last 3 Encounters:  08/10/22 135 lb 12.8 oz (61.6 kg)  06/05/22 133 lb 6.4 oz (60.5 kg)  05/29/22 139 lb 1.3 oz (63.1 kg)    Diabetic Foot Exam - Simple   No data filed    Lab Results  Component Value Date   WBC 6.7 08/10/2022   HGB 14.1 08/10/2022   HCT 41.9 08/10/2022   PLT 230.0 08/10/2022   GLUCOSE 87 08/10/2022   CHOL 147 08/10/2022   TRIG 125.0 08/10/2022   HDL 44.60 08/10/2022   LDLDIRECT 163.0 07/24/2014   LDLCALC 78 08/10/2022   ALT 11 08/10/2022   AST 16 08/10/2022   NA 143 08/10/2022   K 3.9 08/10/2022   CL 102 08/10/2022   CREATININE 1.00 08/10/2022   BUN 20 08/10/2022   CO2 31 08/10/2022   TSH 1.48 08/10/2022   HGBA1C 5.2 08/10/2022    Lab Results  Component Value Date   TSH 1.48 08/10/2022   Lab Results  Component Value Date   WBC 6.7 08/10/2022   HGB 14.1 08/10/2022   HCT 41.9 08/10/2022   MCV 95.2 08/10/2022   PLT 230.0 08/10/2022   Lab Results  Component Value Date   NA 143 08/10/2022   K 3.9 08/10/2022   CO2 31 08/10/2022   GLUCOSE 87 08/10/2022   BUN 20 08/10/2022   CREATININE 1.00 08/10/2022   BILITOT 1.0 08/10/2022   ALKPHOS 58 08/10/2022   AST 16 08/10/2022   ALT 11 08/10/2022   PROT 7.0 08/10/2022   ALBUMIN 4.2 08/10/2022   CALCIUM 9.8 08/10/2022   ANIONGAP 9 12/28/2020   GFR 54.70 (L) 08/10/2022   Lab Results  Component Value Date   CHOL 147 08/10/2022   Lab Results  Component Value Date   HDL 44.60 08/10/2022   Lab Results  Component Value Date   LDLCALC 78 08/10/2022   Lab Results  Component Value Date   TRIG 125.0 08/10/2022   Lab Results  Component Value Date   CHOLHDL 3 08/10/2022   Lab  Results  Component Value Date   HGBA1C 5.2 08/10/2022      Assessment & Plan:  Increasing Anxiety and Chest Pain: Echocardiogram ordered and referral placed to cardiology. Encouraged 6-8 hours of sleep, heart healthy diet, 60-80 oz of non-alcohol/non-caffeinated fluids, and 4000-8000 steps daily.  Hyperlipidemia: This is controlled with Rosuvastatin 20 mg daily. There were no medication adjustments today.  Hypertension: This is controlled with Amlodipine 10 mg, Furosemide 20 mg, Metoprolol-Succinate 50 mg, and Valsartan 320 mg. There were no medication adjustments today.  Labs: Routine blood work ordered.  Problem List Items Addressed This Visit     Hyperlipidemia, mixed - Primary    Encourage heart healthy diet such as MIND or DASH diet, increase exercise, avoid trans fats, simple carbohydrates and processed foods, consider a krill or fish or flaxseed oil cap daily.       Relevant Orders   Lipid panel (Completed)   Hypertension    Well controlled, no changes to meds. Encouraged heart healthy diet such as the DASH diet and exercise as tolerated.        Relevant Orders   Comprehensive metabolic panel (Completed)   CBC with Differential/Platelet (Completed)   TSH (Completed)   RESOLVED: Low vitamin D level    Supplement and monitor       Osteopenia after menopause    Encouraged to get adequate exercise, calcium and vitamin d intake       Vitamin B 12 deficiency    Supplement and monitor       Relevant Orders   Vitamin B12 (Completed)   Hyperglycemia    hgba1c acceptable, minimize simple carbs. Increase exercise as tolerated.      Relevant Orders   Hemoglobin A1c (Completed)   Vitamin D deficiency    Supplement and monitor       Relevant Orders   Vitamin D 1,25 dihydroxy (Completed)   Atypical chest pain    Infrequent and associated with anxiety. No other obvious associated symptoms but with risk factors will proceed with echo and referred  back to cardiology for  further evaluation. Her memory is worsening and so her husband supplements history and endorses anxiety as a trigger.       Relevant Orders   ECHOCARDIOGRAM COMPLETE   Ambulatory referral to Cardiology   Musculoskeletal pain    Largely noting bilateral shoulder stiffness and pain. Encouraged moist heat and gentle stretching as tolerated. May try NSAIDs and prescription meds as directed and report if symptoms worsen or seek immediate care       No orders of the defined types were placed in this encounter.  I, Danise Edge, MD, personally preformed the services described in this documentation.  All medical record entries made by the scribe were at my direction and in my presence.  I have reviewed the chart and discharge instructions (if applicable) and agree that the record reflects my personal performance and is accurate and complete. 08/10/2022  I,Mohammed Iqbal,acting as a scribe for Danise Edge, MD.,have documented all relevant documentation on the behalf of Danise Edge, MD,as directed by  Danise Edge, MD while in the presence of Danise Edge, MD.  Danise Edge, MD

## 2022-08-10 NOTE — Telephone Encounter (Signed)
Called pt from referral workqueue, pt is requesting a switch from Dr. Dulce Sellar to Dr. Flora Lipps to be closer to [facility], is this okay with you all?

## 2022-08-13 LAB — VITAMIN D 1,25 DIHYDROXY
Vitamin D 1, 25 (OH)2 Total: 24 pg/mL (ref 18–72)
Vitamin D2 1, 25 (OH)2: <8 pg/mL
Vitamin D3 1, 25 (OH)2: 24 pg/mL

## 2022-08-14 ENCOUNTER — Encounter: Payer: Self-pay | Admitting: Family Medicine

## 2022-08-14 NOTE — Assessment & Plan Note (Signed)
Largely noting bilateral shoulder stiffness and pain. Encouraged moist heat and gentle stretching as tolerated. May try NSAIDs and prescription meds as directed and report if symptoms worsen or seek immediate care

## 2022-08-14 NOTE — Assessment & Plan Note (Addendum)
Infrequent and associated with anxiety. No other obvious associated symptoms but with risk factors will proceed with echo and referred  back to cardiology for further evaluation. Her memory is worsening and so her husband supplements history and endorses anxiety as a trigger.

## 2022-08-21 NOTE — Progress Notes (Unsigned)
Cardiology Office Note:   Date:  08/23/2022  NAME:  Erika [name]    MRN: [id number] DOB:  [birth year]   PCP:  Bradd Canary, MD  Cardiologist:  None  Electrophysiologist:  None   Referring MD: Bradd Canary, MD   Chief Complaint  Patient presents with   Chest Pain    History of Present Illness:   Erika [name] is a 77 y.o. female with a hx of CAD, PAD, OSA, HTN, HLD who presents for follow-up.  She reports she continues to have sharp chest discomfort.  Triggered by anxiety.  She has been diagnosed with dementia.  She presents with her husband.  Apparently when she gets stressed or anxious she does get tightness in her chest.  Symptoms do not occur with activity.  They are not alleviated by rest.  She is undergone stress testing in 2022 that was unremarkable.  Her EKG does demonstrate sinus rhythm with diffuse ST depressions.  She has a repeat echo pending.  She seems to except that anxiety triggers this.  She really does not seem to be bothered by it.  We did discuss repeat stress testing but she would like to decline this at this time.  She tells me given her memory issues she is not wanting to add more medications or do anything invasive.  Her husband also expresses a desire.  We did discuss that repeat echo is beneficial in this situation and this would guide what we would recommend.  Her CV exam is unremarkable.  Her blood pressure is 186/62.  This is poorly controlled.  We discussed changing medications but they are not wanting to do this.  They tell me the blood pressure is fairly controlled in the 140-150 range at home.  Problem List CAD -CAC 1020 (95th percentile) -negative MPI 07/22/2020 2. HLD -T chol 147, HDL 44, LDL 78, TG 125 3. 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 OSA 5. 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   Cerebrovascular disease    Chest pain 01/19/2014   Chronic rhinitis 01/13/2009   Diverticulosis of colon    colonoscopy 04/14/1999   Dry mouth 03/13/2012   Fatigue 08/02/2014   GERD (gastroesophageal reflux disease)    Headache 02/11/2018   History of chicken pox    as a child   History of fibrocystic disease of breast    History of measles    as a child   History of mumps    as a child   Hyperlipidemia, mixed 02/20/2007   Goal LDL < 130 HBP/Pos fm hx   Hypertension    Hypokalemia 06/24/2012   Hyponatremia 08/02/2014   IBS (irritable bowel syndrome) 04/02/2011   IDA (iron deficiency anemia) 12/28/2020   Low vitamin D level 01/09/2016   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   Obstructive sleep apnea 05/23/2017   uses CPAP nightly   Osteopenia after menopause    Overactive bladder 04/02/2011   Pedal edema 05/20/2020   Shoulder pain, bilateral 06/14/2011   Thoracic back pain 07/31/2017   Vitamin B 12 deficiency 10/16/2020    Past Surgical History: Past Surgical History:  Procedure Laterality Date   BREAST REDUCTION SURGERY Bilateral 1986   BUNIONECTOMY Bilateral 1980  CATARACT EXTRACTION, BILATERAL     COLONOSCOPY  2011   HAMMER TOE SURGERY Right 12/03/2013   foot   MOUTH SURGERY  01/10/2013   negative   TONSILLECTOMY     UPPER GASTROINTESTINAL ENDOSCOPY  2016   RK    Current Medications: Current Meds  Medication Sig   acetaminophen (TYLENOL) 325 MG tablet Take 325 mg by mouth at bedtime.   amLODipine (NORVASC) 10 MG tablet TAKE 0.5 TABLETS BY MOUTH 2 TIMES DAILY.   aspirin EC 81 MG tablet Take 1 tablet (81 mg total) by mouth daily.   donepezil (ARICEPT) 10 MG tablet TAKE 0.5 TABLETS BY MOUTH AT BEDTIME.   famotidine (PEPCID) 40 MG tablet famotidine 40 mg tablet TAKE 1 TABLET (40 MG TOTAL) BY MOUTH AT BEDTIME AS  NEEDED FOR HEARTBURN OR INDIGESTION.   folic acid (FOLVITE) 1 MG tablet TAKE 1 TABLET BY MOUTH EVERY DAY   metoprolol succinate (TOPROL-XL) 50 MG 24 hr tablet TAKE 1 TABLET BY MOUTH TWICE A DAY WITH OR IMMEDIATELY FOLLOWING A MEAL   Multiple Minerals-Vitamins (CITRACAL PLUS PO) Take 1 tablet by mouth 2 (two) times daily.   rosuvastatin (CRESTOR) 20 MG tablet TAKE 1 TABLET BY MOUTH EVERY DAY   trospium (SANCTURA) 20 MG tablet Take 1 tablet (20 mg total) by mouth daily.   UNABLE TO FIND COGNITIVE TABLET DAILY   valsartan (DIOVAN) 320 MG tablet TAKE 1 TABLET BY MOUTH EVERY DAY   Current Facility-Administered Medications for the 08/23/22 encounter (Office Visit) with Sande Rives, MD  Medication   cyanocobalamin ((VITAMIN B-12)) injection 1,000 mcg     Allergies:    Patient has no known allergies.   Social History: Social History   Socioeconomic History   Marital status: Married    Spouse name: Not on file   Number of children: 4   Years of education: 14   Highest education level: Associate degree: academic program  Occupational History   Occupation: Retired    Comment: Tax inspector  Tobacco Use   Smoking status: Never   Smokeless tobacco: Never  Vaping Use   Vaping Use: Never used  Substance and Sexual Activity   Alcohol use: No   Drug use: No   Sexual activity: Not Currently  Other Topics Concern   Not on file  Social History Narrative   4 children 2 are adopted      Lives at home with husband, children live closeby    Right handed   Caffeine: maybe 1-3 "or more" cups/day   Social Determinants of Health   Financial Resource Strain: Low Risk  (02/22/2022)   Overall Financial Resource Strain (CARDIA)    Difficulty of Paying Living Expenses: Not hard at all  Food Insecurity: No Food Insecurity (02/22/2022)   Hunger Vital Sign    Worried About Running Out of Food in the Last Year: Never true    Ran Out of Food in the Last Year: Never true  Transportation  Needs: No Transportation Needs (02/22/2022)   PRAPARE - Administrator, Civil Service (Medical): No    Lack of Transportation (Non-Medical): No  Physical Activity: Inactive (02/22/2022)   Exercise Vital Sign    Days of Exercise per Week: 0 days    Minutes of Exercise per Session: 0 min  Stress: No Stress Concern Present (02/22/2022)   [employer] of Occupational Health - Occupational Stress Questionnaire    Feeling of Stress : Not at all  Social Connections: Socially Integrated (02/22/2022)  Social Advertising account executive [NHANES]    Frequency of Communication with Friends and Family: More than three times a week    Frequency of Social Gatherings with Friends and Family: More than three times a week    Attends Religious Services: More than 4 times per year    Active Member of [employer] or Organizations: Yes    Attends Engineer, structural: More than 4 times per year    Marital Status: Married     Family History: The patient's family history includes Alcohol abuse in her son; Aneurysm in her brother; Bipolar disorder in her son; Heart attack (age of onset: 65) in her father; Heart disease in her brother and father; Hyperlipidemia in her brother and mother; Hypertension in her brother, father, and mother; Mental illness in her son; Multiple sclerosis in her daughter; Schizophrenia in her son; Stroke (age of onset: 10) in her mother. There is no history of Colon cancer, Colon polyps, Esophageal cancer, Rectal cancer, Stomach cancer, or Alzheimer's disease.  ROS:   All other ROS reviewed and negative. Pertinent positives noted in the HPI.     EKGs/Labs/Other Studies Reviewed:   The following studies were personally reviewed by me today:  EKG:  EKG is ordered today.   EKG Interpretation  Date/Time:  Wednesday August 23 2022 11:45:28 EDT Ventricular Rate:  48 PR Interval:  148 QRS Duration: 100 QT Interval:  474 QTC Calculation: 423 R Axis:   79 Text  Interpretation: Sinus bradycardia Left ventricular hypertrophy with repolarization abnormality ( Sokolow-Lyon Nonspecific ST and T wave abnormality Confirmed by Lennie Odor [phone]) on 08/23/2022 11:48:16 AM   Recent Labs: 08/10/2022: ALT 11; BUN 20; Creatinine, Ser 1.00; Hemoglobin 14.1; Platelets 230.0; Potassium 3.9; Sodium 143; TSH 1.48   Recent Lipid Panel    Component Value Date/Time   CHOL 147 08/10/2022 1120   TRIG 125.0 08/10/2022 1120   HDL 44.60 08/10/2022 1120   CHOLHDL 3 08/10/2022 1120   VLDL 25.0 08/10/2022 1120   LDLCALC 78 08/10/2022 1120   LDLCALC 128 (H) 11/18/2019 0932   LDLDIRECT 163.0 07/24/2014 1215    Physical Exam:   VS:  BP (!) 186/62   Pulse (!) 58   Ht 5\' 6"  (1.676 m)   Wt 131 lb (59.4 kg)   SpO2 96%   BMI 21.14 kg/m    Wt Readings from Last 3 Encounters:  08/23/22 131 lb (59.4 kg)  08/10/22 135 lb 12.8 oz (61.6 kg)  06/05/22 133 lb 6.4 oz (60.5 kg)    General: Well nourished, well developed, in no acute distress Head: Atraumatic, normal size  Eyes: PEERLA, EOMI  Neck: Supple, no JVD Endocrine: No thryomegaly Cardiac: Normal S1, S2; RRR; no murmurs, rubs, or gallops Lungs: Clear to auscultation bilaterally, no wheezing, rhonchi or rales  Abd: Soft, nontender, no hepatomegaly  Ext: No edema, pulses 2+ Musculoskeletal: No deformities, BUE and BLE strength normal and equal Skin: Warm and dry, no rashes   Neuro: Alert and oriented to person, place, time, and situation, CNII-XII grossly intact, no focal deficits  Psych: Normal mood and affect   ASSESSMENT:   AVALYNN RASBURY is a 78 y.o. female who presents for the following: 1. Precordial pain   2. Coronary artery disease involving native coronary artery of native heart without angina pectoris   3. Celiac artery stenosis   4. Mixed hyperlipidemia   5. Renovascular hypertension     PLAN:   1. Precordial pain -Suspect noncardiac chest pain.  EKG does show diffuse ST depressions which are  likely related to LVH.  She is actually had these in the past.  Her EKG is largely unchanged.  Negative stress test in 2022.  Symptoms are triggered by anxiety and stress.  She has been diagnosed with dementia.  We discussed repeat stress testing but she would like to hold on this.  Her husband also expresses this is our today in office.  She does have a repeat echo pending.  We did offer her repeat evaluation and follow-up in 6 months.  They both expressed a desire to not do this.  They would like to follow-up as needed.  I think this is okay.  2. Coronary artery disease involving native coronary artery of native heart without angina pectoris -Elevated coronary calcium score.  Negative stress test in 2022.  Likely noncardiac chest discomfort.  Continue with conservative approach given dementia.  We did discuss statin therapy.  She is on Crestor 20 mg daily.  Her LDL is 78.  I think this is reasonable given her age and dementia diagnosis.  3. Celiac artery stenosis -She does report 30 pound weight loss.  I wonder if this is contributing.  Evaluated by vascular surgery and no intervention was deemed necessary.  May want to reevaluate this.  4. Mixed hyperlipidemia -Continue Crestor.  LDL close enough to goal.  5. Renovascular hypertension -BP not at goal.  We discussed intensification of therapy.  For now they would like to hold on this.  They are not wanting to take more medications.  They will work with their primary care physician on this.  We did offer repeat evaluation in follow-up today declined today.   Disposition: Return if symptoms worsen or fail to improve.  Medication Adjustments/Labs and Tests Ordered: Current medicines are reviewed at length with the patient today.  Concerns regarding medicines are outlined above.  Orders Placed This Encounter  Procedures   EKG 12-Lead   No orders of the defined types were placed in this encounter.   Patient Instructions  Medication  Instructions:  The current medical regimen is effective;  continue present plan and medications.  *If you need a refill on your cardiac medications before your next appointment, please call your pharmacy*   Follow-Up: At [facility], you and your health needs are our priority.  As part of our continuing mission to provide you with exceptional heart care, we have created designated Provider Care Teams.  These Care Teams include your primary Cardiologist (physician) and Advanced Practice Providers (APPs -  Physician Assistants and Nurse Practitioners) who all work together to provide you with the care you need, when you need it.  We recommend signing up for the patient portal called "MyChart".  Sign up information is provided on this After Visit Summary.  MyChart is used to connect with patients for Virtual Visits (Telemedicine).  Patients are able to view lab/test results, encounter notes, upcoming appointments, etc.  Non-urgent messages can be sent to your provider as well.   To learn more about what you can do with MyChart, go to [email or web address].    Your next appointment:   As needed  Provider:   Lennie Odor, MD      Time Spent with Patient: I have spent a total of 25 minutes with patient reviewing hospital notes, telemetry, EKGs, labs and examining the patient as well as establishing an assessment and plan that was discussed with the patient.  > 50% of time was spent in  direct patient care.  Signed, Lenna Gilford. Flora Lipps, MD, [facility]  [address] [place], [place] [address] [phone]  08/23/2022 1:06 PM

## 2022-08-23 ENCOUNTER — Ambulatory Visit: Payer: PPO | Attending: Cardiovascular Disease | Admitting: Cardiovascular Disease

## 2022-08-23 ENCOUNTER — Encounter: Payer: Self-pay | Admitting: Cardiovascular Disease

## 2022-08-23 VITALS — BP 186/62 | HR 58 | Ht 66.0 in | Wt 131.0 lb

## 2022-08-23 DIAGNOSIS — E782 Mixed hyperlipidemia: Secondary | ICD-10-CM

## 2022-08-23 DIAGNOSIS — I771 Stricture of artery: Secondary | ICD-10-CM | POA: Diagnosis not present

## 2022-08-23 DIAGNOSIS — I150 Renovascular hypertension: Secondary | ICD-10-CM | POA: Diagnosis not present

## 2022-08-23 DIAGNOSIS — I2510 Atherosclerotic heart disease of native coronary artery without angina pectoris: Secondary | ICD-10-CM | POA: Diagnosis not present

## 2022-08-23 DIAGNOSIS — R072 Precordial pain: Secondary | ICD-10-CM | POA: Diagnosis not present

## 2022-08-23 NOTE — Patient Instructions (Signed)
Medication Instructions:  The current medical regimen is effective;  continue present plan and medications.  *If you need a refill on your cardiac medications before your next appointment, please call your pharmacy*   Follow-Up: At [facility], you and your health needs are our priority.  As part of our continuing mission to provide you with exceptional heart care, we have created designated Provider Care Teams.  These Care Teams include your primary Cardiologist (physician) and Advanced Practice Providers (APPs -  Physician Assistants and Nurse Practitioners) who all work together to provide you with the care you need, when you need it.  We recommend signing up for the patient portal called "MyChart".  Sign up information is provided on this After Visit Summary.  MyChart is used to connect with patients for Virtual Visits (Telemedicine).  Patients are able to view lab/test results, encounter notes, upcoming appointments, etc.  Non-urgent messages can be sent to your provider as well.   To learn more about what you can do with MyChart, go to [email or web address].    Your next appointment:   As needed  Provider:   Helvetia O'Neal, MD   

## 2022-08-25 ENCOUNTER — Other Ambulatory Visit: Payer: Self-pay | Admitting: Family Medicine

## 2022-08-28 ENCOUNTER — Other Ambulatory Visit: Payer: Self-pay | Admitting: Family Medicine

## 2022-09-06 ENCOUNTER — Ambulatory Visit (HOSPITAL_BASED_OUTPATIENT_CLINIC_OR_DEPARTMENT_OTHER)
Admission: RE | Admit: 2022-09-06 | Discharge: 2022-09-06 | Disposition: A | Discharge status: Home / Self Care | Payer: PPO | Source: Ambulatory Visit | Attending: Family Medicine | Admitting: Family Medicine

## 2022-09-06 DIAGNOSIS — I081 Rheumatic disorders of both mitral and tricuspid valves: Secondary | ICD-10-CM | POA: Insufficient documentation

## 2022-09-06 DIAGNOSIS — I10 Essential (primary) hypertension: Secondary | ICD-10-CM | POA: Insufficient documentation

## 2022-09-06 DIAGNOSIS — E785 Hyperlipidemia, unspecified: Secondary | ICD-10-CM | POA: Insufficient documentation

## 2022-09-06 DIAGNOSIS — R0789 Other chest pain: Secondary | ICD-10-CM | POA: Insufficient documentation

## 2022-09-06 LAB — ECHOCARDIOGRAM COMPLETE
Area-P 1/2: 2.37 cm2
MV M vel: 4.89 m/s
MV Peak grad: 95.6 mmHg
S' Lateral: 2.7 cm

## 2022-09-14 ENCOUNTER — Encounter: Payer: Self-pay | Admitting: Family

## 2022-09-14 ENCOUNTER — Encounter: Payer: Self-pay | Admitting: Neurology

## 2022-09-14 ENCOUNTER — Ambulatory Visit: Payer: PPO | Admitting: Neurology

## 2022-09-14 VITALS — BP 147/47 | HR 50 | Ht 67.0 in | Wt 135.0 lb

## 2022-09-14 DIAGNOSIS — F0390 Unspecified dementia without behavioral disturbance: Secondary | ICD-10-CM | POA: Diagnosis not present

## 2022-09-14 NOTE — Progress Notes (Signed)
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 Plan:    In summary, MAGGIE SENSENEY is a very pleasant 77 year old female with an underlying medical history of allergies, arthritis, reflux disease, hypertension, hyperlipidemia, irritable bowel syndrome, sleep apnea, mildly overweight state, iron deficiency, and vitamin B12 deficiency, who presents for follow-up consultation of her memory loss, findings with neuropsychological evaluation and brain PET scan from 2023 not supportive of frontotemporal dementia or Alzheimer's dementia.  Mixed dementia is possible, there is no evidence of behavioral disturbance thankfully.  She feels stable.  She tolerates donepezil which is currently 5 mg twice daily.  I encouraged her to make 10 mg once daily in the evening.  We may consider adding a second medication in the near future.  We will maintain the course for now, she is advised to drink more water and limit her sweet tea intake.  We talked about the importance of physical activity.  She is advised to  follow-up routinely in this clinic to see the nurse practitioner in about 6 months, sooner if needed.  I answered all the questions today and the patient and her husband were in agreement. I spent 40 minutes in total face-to-face time and in reviewing records during pre-charting, more than 50% of which was spent in counseling and coordination of care, reviewing test results, reviewing medications and treatment regimen and/or in discussing or reviewing the diagnosis of dementia without behavioral disturbance, the prognosis and treatment options. Pertinent laboratory and imaging test results that were available during this visit with the patient were reviewed by me and considered in my medical decision making (see chart for details).

## 2022-09-14 NOTE — Patient Instructions (Signed)
Please take donepezil 10 mg at bedtime.  We may consider addition of a second memory medication in the near future.   Try to drink more water, 6-8 cups per day, 8 oz size each.   Limit your sweet tea intake to up to 2/day.   Follow up in 6 months to see Ihor Austin, NP.

## 2022-10-09 ENCOUNTER — Telehealth: Payer: Self-pay | Admitting: Family Medicine

## 2022-10-09 ENCOUNTER — Other Ambulatory Visit: Payer: Self-pay | Admitting: Family Medicine

## 2022-10-09 DIAGNOSIS — F0390 Unspecified dementia without behavioral disturbance: Secondary | ICD-10-CM

## 2022-10-09 MED ORDER — DONEPEZIL HCL 10 MG PO TABS: 10.0000 mg | ORAL_TABLET | Freq: Every day | ORAL | 1 refills | Status: DC

## 2022-10-09 NOTE — Telephone Encounter (Signed)
Patient's husb. said that back in June, pt was advised to start taking a whole pill of her  donepezil (ARICEPT) 10 MG tablet [18787] and the prescription was never updated so they only had a 45 day supply and she is down to her last pill. Please send in new prescription to pharmacy: [id number] [place], [place] - [address], [place] [address] Phone: [phone]  Fax: [phone]

## 2022-10-10 NOTE — Telephone Encounter (Signed)
Medication sent.

## 2022-10-11 ENCOUNTER — Other Ambulatory Visit: Payer: Self-pay | Admitting: Family Medicine

## 2022-10-19 ENCOUNTER — Encounter (INDEPENDENT_AMBULATORY_CARE_PROVIDER_SITE_OTHER): Payer: Self-pay

## 2022-11-14 ENCOUNTER — Ambulatory Visit (INDEPENDENT_AMBULATORY_CARE_PROVIDER_SITE_OTHER): Payer: PPO | Admitting: Family

## 2022-11-14 ENCOUNTER — Ambulatory Visit: Payer: PPO | Admitting: Family Medicine

## 2022-11-14 VITALS — BP 152/68 | HR 50 | Temp 98.3°F | Resp 16 | Wt 137.0 lb

## 2022-11-14 DIAGNOSIS — F515 Nightmare disorder: Secondary | ICD-10-CM | POA: Diagnosis not present

## 2022-11-14 DIAGNOSIS — E559 Vitamin D deficiency, unspecified: Secondary | ICD-10-CM | POA: Diagnosis not present

## 2022-11-14 DIAGNOSIS — R634 Abnormal weight loss: Secondary | ICD-10-CM

## 2022-11-14 DIAGNOSIS — E538 Deficiency of other specified B group vitamins: Secondary | ICD-10-CM

## 2022-11-14 NOTE — Progress Notes (Unsigned)
Subjective:     Patient ID: Erika [name], female    DOB: [birth year], 77 y.o.   MRN: [id number]  Chief Complaint  Patient presents with   Hypertension    Here for follow up   Insomnia    Having trouble falling and staying sleep    Hypertension Pertinent negatives include no blurred vision, chest pain, headaches or palpitations.  Insomnia PMH includes: no depression.     Discussed the use of AI scribe software for clinical note transcription with the patient, who gave verbal consent to proceed.  77 year old female presents to the clinic today for blood pressure follow and and new issues with her sleep. She is currently on amlodipine and metoprolol for blood pressure.   She thinks that sleep is altered due to her daughter moving in with her and her husband. They states their daughter will be living with them for the next 10 years due to their daughter and husband selling there house and moving to [place] and with their 6 grand kids. She states that she thinks she is having issues with her B12 levels as well. She states she is always cold and can never get warm. She was taking iron in the past but she gets constipation. Educated her on anemia and possible issues with iron and the side effects of that.   Suggested Melatonin to start with to help with her sleep   She states that she is unable to turn her brain off. She is having night terrors as well.   No tv or cell phone on in the background when she is tying to sleep. She states that it is about 2-3 nights a week when she is unable to sleep.   She states that her husband notes that she drifts off a lot in her chair but unable to really fall asleep.   Scary dreams of people killing her.  Lost about 37pounds in the past year. She is not trying anything new with diet or exercise.             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   Cerebrovascular disease    Chest pain 01/19/2014   Chronic rhinitis 01/13/2009   Diverticulosis of colon    colonoscopy 04/14/1999   Dry mouth 03/13/2012   Fatigue 08/02/2014   GERD (gastroesophageal reflux disease)    Headache 02/11/2018   History of chicken pox    as a child   History of fibrocystic disease of breast    History of measles    as a child   History of mumps    as a child   Hyperlipidemia, mixed 02/20/2007   Goal LDL < 130 HBP/Pos fm hx   Hypertension    Hypokalemia 06/24/2012   Hyponatremia 08/02/2014   IBS (irritable bowel syndrome) 04/02/2011   IDA (iron deficiency anemia) 12/28/2020   Low vitamin D level 01/09/2016   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   Obstructive  sleep apnea 05/23/2017   uses CPAP nightly   Osteopenia after menopause    Overactive bladder 04/02/2011   Pedal edema 05/20/2020   Shoulder pain, bilateral 06/14/2011   Thoracic back pain 07/31/2017   Vitamin B 12 deficiency 10/16/2020    Past Surgical History:  Procedure Laterality Date   BREAST REDUCTION SURGERY Bilateral 1986   BUNIONECTOMY Bilateral 1980   CATARACT EXTRACTION, BILATERAL     COLONOSCOPY  2011   HAMMER TOE SURGERY Right 12/03/2013   foot   MOUTH SURGERY  01/10/2013   negative   TONSILLECTOMY     UPPER GASTROINTESTINAL ENDOSCOPY  2016   RK    Family History  Problem Relation Age of Onset   Stroke Mother 74   Hyperlipidemia Mother    Hypertension Mother    Heart attack Father 8   Hypertension Father    Heart disease Father     Hypertension Brother    Aneurysm Brother        aortic   Hyperlipidemia Brother    Heart disease Brother        aortic aneurysm rupture   Multiple sclerosis Daughter    Alcohol abuse Son    Schizophrenia Son    Bipolar disorder Son    Mental illness Son        schizophrenic, bipolar   Colon cancer Neg Hx    Colon polyps Neg Hx    Esophageal cancer Neg Hx    Rectal cancer Neg Hx    Stomach cancer Neg Hx    Alzheimer's disease Neg Hx     Social History   Socioeconomic History   Marital status: Married    Spouse name: Not on file   Number of children: 4   Years of education: 14   Highest education level: Associate degree: academic program  Occupational History   Occupation: Retired    Comment: Tax inspector  Tobacco Use   Smoking status: Never   Smokeless tobacco: Never  Vaping Use   Vaping status: Never Used  Substance and Sexual Activity   Alcohol use: No   Drug use: No   Sexual activity: Not Currently  Other Topics Concern   Not on file  Social History Narrative   4 children 2 are adopted      Lives at home with husband, children live closeby    Right handed   Caffeine: maybe 1-3 "or more" cups/day   Social Determinants of Health   Financial Resource Strain: Low Risk  (02/22/2022)   Overall Financial Resource Strain (CARDIA)    Difficulty of Paying Living Expenses: Not hard at all  Food Insecurity: No Food Insecurity (02/22/2022)   Hunger Vital Sign    Worried About Running Out of Food in the Last Year: Never true    Ran Out of Food in the Last Year: Never true  Transportation Needs: No Transportation Needs (02/22/2022)   PRAPARE - Administrator, Civil Service (Medical): No    Lack of Transportation (Non-Medical): No  Physical Activity: Inactive (02/22/2022)   Exercise Vital Sign    Days of Exercise per Week: 0 days    Minutes of Exercise per Session: 0 min  Stress: No Stress Concern Present (02/22/2022)   [employer] of  Occupational Health - Occupational Stress Questionnaire    Feeling of Stress : Not at all  Social Connections: Socially Integrated (02/22/2022)   Social Connection and Isolation Panel [NHANES]    Frequency of Communication with Friends and  Family: More than three times a week    Frequency of Social Gatherings with Friends and Family: More than three times a week    Attends Religious Services: More than 4 times per year    Active Member of [employer] or Organizations: Yes    Attends Engineer, structural: More than 4 times per year    Marital Status: Married  Catering manager Violence: Not At Risk (02/22/2022)   Humiliation, Afraid, Rape, and Kick questionnaire    Fear of Current or Ex-Partner: No    Emotionally Abused: No    Physically Abused: No    Sexually Abused: No    Outpatient Medications Prior to Visit  Medication Sig Dispense Refill   acetaminophen (TYLENOL) 325 MG tablet Take 325 mg by mouth at bedtime.     amLODipine (NORVASC) 10 MG tablet TAKE 1/2 TABLET TWICE A DAY BY MOUTH 90 tablet 1   aspirin EC 81 MG tablet Take 1 tablet (81 mg total) by mouth daily.     donepezil (ARICEPT) 10 MG tablet Take 1 tablet (10 mg total) by mouth at bedtime. 90 tablet 1   famotidine (PEPCID) 40 MG tablet famotidine 40 mg tablet TAKE 1 TABLET (40 MG TOTAL) BY MOUTH AT BEDTIME AS NEEDED FOR HEARTBURN OR INDIGESTION.     folic acid (FOLVITE) 1 MG tablet TAKE 1 TABLET BY MOUTH EVERY DAY 90 tablet 2   furosemide (LASIX) 20 MG tablet Take 0.5 tablets (10 mg total) by mouth daily as needed for edema. 45 tablet 1   metoprolol succinate (TOPROL-XL) 50 MG 24 hr tablet TAKE 1 TABLET BY MOUTH TWICE A DAY WITH OR IMMEDIATELY FOLLOWING A MEAL 180 tablet 1   Multiple Minerals-Vitamins (CITRACAL PLUS PO) Take 1 tablet by mouth 2 (two) times daily.     rosuvastatin (CRESTOR) 20 MG tablet TAKE 1 TABLET BY MOUTH EVERY DAY 90 tablet 1   trospium (SANCTURA) 20 MG tablet Take 1 tablet (20 mg total) by mouth  daily. 90 tablet 1   UNABLE TO FIND COGNITIVE TABLET DAILY     valsartan (DIOVAN) 320 MG tablet TAKE 1 TABLET BY MOUTH EVERY DAY 90 tablet 1   Facility-Administered Medications Prior to Visit  Medication Dose Route Frequency Provider Last Rate Last Admin   cyanocobalamin ((VITAMIN B-12)) injection 1,000 mcg  1,000 mcg Intramuscular Q30 days Zola Button, Myrene Buddy R, DO   1,000 mcg at 10/27/20 1052    No Known Allergies  Review of Systems  Constitutional:  Positive for weight loss. Negative for fever.  HENT:  Negative for ear discharge, hearing loss, nosebleeds and tinnitus.   Eyes:  Negative for blurred vision, double vision and photophobia.  Respiratory:  Negative for cough and hemoptysis.   Cardiovascular:  Negative for chest pain and palpitations.  Gastrointestinal:  Negative for heartburn, nausea and vomiting.  Genitourinary:  Negative for dysuria, frequency and urgency.  Musculoskeletal:  Negative for back pain, joint pain and myalgias.  Skin:  Negative for itching and rash.  Neurological:  Negative for dizziness, tremors and headaches.  Endo/Heme/Allergies:  Bruises/bleeds easily.  Psychiatric/Behavioral:  Negative for depression and suicidal ideas. The patient has insomnia.        Has dreams of people hurting her and chasing her        Objective:    Physical Exam Constitutional:      Appearance: She is normal weight.  HENT:     Head: Normocephalic.     Nose: Nose normal.  Mouth/Throat:     Mouth: Mucous membranes are moist.  Cardiovascular:     Rate and Rhythm: Normal rate and regular rhythm.     Pulses: Normal pulses.     Heart sounds: Normal heart sounds.  Pulmonary:     Effort: Pulmonary effort is normal.     Breath sounds: Normal breath sounds.  Musculoskeletal:     Cervical back: Normal range of motion.  Neurological:     General: No focal deficit present.     Mental Status: She is alert and oriented to person, place, and time. Mental status is at  baseline.      BP (!) 191/55 (BP Location: Right Arm, Patient Position: Sitting, Cuff Size: Small)   Pulse (!) 50   Temp 98.3 F (36.8 C) (Oral)   Resp 16   Wt 137 lb (62.1 kg)   SpO2 98%   BMI 21.46 kg/m  Wt Readings from Last 3 Encounters:  11/14/22 137 lb (62.1 kg)  09/14/22 135 lb (61.2 kg)  08/23/22 131 lb (59.4 kg)       Assessment & Plan:   Problem List Items Addressed This Visit   None   I am having Erika [name] maintain her Multiple Minerals-Vitamins (CITRACAL PLUS PO), aspirin EC, UNABLE TO FIND, furosemide, folic acid, acetaminophen, valsartan, rosuvastatin, famotidine, trospium, amLODipine, donepezil, and metoprolol succinate. We will continue to administer cyanocobalamin.  No orders of the defined types were placed in this encounter.

## 2022-11-15 ENCOUNTER — Telehealth: Payer: Self-pay | Admitting: Family

## 2022-11-15 DIAGNOSIS — R634 Abnormal weight loss: Secondary | ICD-10-CM | POA: Insufficient documentation

## 2022-11-15 DIAGNOSIS — F515 Nightmare disorder: Secondary | ICD-10-CM | POA: Insufficient documentation

## 2022-11-15 DIAGNOSIS — E538 Deficiency of other specified B group vitamins: Secondary | ICD-10-CM

## 2022-11-15 LAB — CBC WITH DIFFERENTIAL/PLATELET
Basophils Absolute: 0 10*3/uL (ref 0.0–0.1)
Basophils Relative: 0.3 % (ref 0.0–3.0)
Eosinophils Absolute: 0.2 10*3/uL (ref 0.0–0.7)
Eosinophils Relative: 3 % (ref 0.0–5.0)
HCT: 42.5 % (ref 36.0–46.0)
Hemoglobin: 14.5 g/dL (ref 12.0–15.0)
Lymphocytes Relative: 35.9 % (ref 12.0–46.0)
Lymphs Abs: 2.2 10*3/uL (ref 0.7–4.0)
MCHC: 34.1 g/dL (ref 30.0–36.0)
MCV: 95.2 fl (ref 78.0–100.0)
Monocytes Absolute: 0.6 10*3/uL (ref 0.1–1.0)
Monocytes Relative: 9.9 % (ref 3.0–12.0)
Neutro Abs: 3.1 10*3/uL (ref 1.4–7.7)
Neutrophils Relative %: 50.9 % (ref 43.0–77.0)
Platelets: 245 10*3/uL (ref 150.0–400.0)
RBC: 4.47 Mil/uL (ref 3.87–5.11)
RDW: 12.1 % (ref 11.5–15.5)
WBC: 6.1 10*3/uL (ref 4.0–10.5)

## 2022-11-15 LAB — COMPREHENSIVE METABOLIC PANEL
ALT: 13 U/L (ref 0–35)
AST: 18 U/L (ref 0–37)
Albumin: 4.2 g/dL (ref 3.5–5.2)
Alkaline Phosphatase: 66 U/L (ref 39–117)
BUN: 13 mg/dL (ref 6–23)
CO2: 32 meq/L (ref 19–32)
Calcium: 10 mg/dL (ref 8.4–10.5)
Chloride: 102 meq/L (ref 96–112)
Creatinine, Ser: 1.07 mg/dL (ref 0.40–1.20)
GFR: 50.34 mL/min — ABNORMAL LOW (ref >60.00)
Glucose, Bld: 99 mg/dL (ref 70–99)
Potassium: 3.5 meq/L (ref 3.5–5.1)
Sodium: 143 meq/L (ref 135–145)
Total Bilirubin: 0.9 mg/dL (ref 0.2–1.2)
Total Protein: 7.1 g/dL (ref 6.0–8.3)

## 2022-11-15 LAB — VITAMIN D 25 HYDROXY (VIT D DEFICIENCY, FRACTURES): VITD: 48.63 ng/mL (ref 30.00–100.00)

## 2022-11-15 LAB — TSH: TSH: 1.13 u[IU]/mL (ref 0.35–5.50)

## 2022-11-15 LAB — VITAMIN B12: Vitamin B-12: 189 pg/mL — ABNORMAL LOW (ref 211–911)

## 2022-11-15 NOTE — Assessment & Plan Note (Signed)
  30 lbs over a year, possibly related to decreased appetite and changes in eating patterns. -Order CT scan of lungs and abdomen/pelvis to rule out underlying malignancy. -Schedule follow-up in 2 months to monitor weight.

## 2022-11-15 NOTE — Patient Instructions (Signed)
VISIT SUMMARY:  During your visit, we discussed your distressing dreams, significant weight loss, and increased stress due to family changes. We also noted your slightly elevated blood pressure and confirmed your general health maintenance.  YOUR PLAN:  -DEMENTIA AND DISTURBED SLEEP: Your distressing dreams may be related to your dementia medication, Aricept. We will stop this medication for two weeks to see if your dreams improve. If they do, we may consider other medications for your dementia. We will check in with you in 1-2 weeks to see how your sleep is doing.  -UNINTENTIONAL WEIGHT LOSS: Your weight loss may be due to changes in your eating habits. We will order a CT scan of your lungs and abdomen to make sure there is no underlying disease causing this weight loss. We will also schedule a follow-up appointment in 2 months to monitor your weight.  -STRESS: The changes in your family situation have caused you significant stress, which may be affecting your sleep and overall health. We have offered a referral for counseling services to help you manage this stress. If the stress continues to impact your health, we recommend considering counseling.  -HYPERTENSION: Your blood pressure was slightly elevated during your visit, but you reported it is controlled at home. Please continue your current management and monitor your blood pressure at home.  -GENERAL HEALTH MAINTENANCE: We confirmed that you received your annual flu shot and have planned to receive your second shingles vaccine at the end of the month.  INSTRUCTIONS:  Please stop taking Aricept for two weeks and monitor your dreams. We will check in with you in 1-2 weeks to see how your sleep is doing. We will also order a CT scan for you and schedule a follow-up appointment in 2 months to monitor your weight. Please continue to monitor your blood pressure at home. If your stress continues to impact your health, please consider counseling.

## 2022-11-15 NOTE — Telephone Encounter (Signed)
B12 level is low. Start b12 injections IM weekly x 4 then monthly.   Thyroid, vitamin D and kidney function are stable.

## 2022-11-15 NOTE — Progress Notes (Signed)
Subjective:     Patient ID: Erika [name], female    DOB: 02/10/1946, 77 y.o.   MRN: [id number]  Chief Complaint  Patient presents with   Hypertension    Here for follow up   Insomnia    Having trouble falling and staying sleep    Hypertension  Insomnia    Discussed the use of AI scribe software for clinical note transcription with the patient, who gave verbal consent to proceed.  History of Present Illness   The patient, with a history of dementia, presents with distressing dreams that started approximately five to six months ago. The history is supported by her husband who is present at today's visit. The patient denies any changes in medication around the time the dreams started but notes that her aricept dose was increased.  Her husband moved her Aricept, to nighttime administration due to its drowsiness side effect. The patient also reports significant stress due to family changes, including her daughter moving in with them for an extended period and a change in their lifestyle. Their grandson is also staying with them but will soon be going off to college.   In addition to the distressing dreams and stress, the patient has experienced significant weight loss of thirty pounds over the past year. The patient reports a change in eating patterns, with food no longer being important and often needing reminders to finish meals. The patient denies any changes in the taste of food.      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   Cerebrovascular disease     Chest pain 01/19/2014   Chronic rhinitis 01/13/2009   Diverticulosis of colon    colonoscopy 04/14/1999   Dry mouth 03/13/2012   Fatigue 08/02/2014   GERD (gastroesophageal reflux disease)    Headache 02/11/2018   History of chicken pox    as a child   History of fibrocystic disease of breast    History of measles    as a child   History of mumps    as a child   Hyperlipidemia, mixed 02/20/2007   Goal LDL < 130 HBP/Pos fm hx   Hypertension    Hypokalemia 06/24/2012   Hyponatremia 08/02/2014   IBS (irritable bowel syndrome) 04/02/2011   IDA (iron deficiency anemia) 12/28/2020   Low vitamin D level 01/09/2016   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   Obstructive sleep apnea 05/23/2017   uses CPAP nightly   Osteopenia after menopause    Overactive bladder 04/02/2011   Pedal edema 05/20/2020   Shoulder pain, bilateral 06/14/2011   Thoracic back pain 07/31/2017   Vitamin B 12 deficiency 10/16/2020    Past Surgical History:  Procedure Laterality Date   BREAST REDUCTION SURGERY Bilateral 1986   BUNIONECTOMY Bilateral 1980   CATARACT EXTRACTION, BILATERAL     COLONOSCOPY  2011   HAMMER TOE SURGERY Right 12/03/2013   foot   MOUTH SURGERY  01/10/2013   negative   TONSILLECTOMY  UPPER GASTROINTESTINAL ENDOSCOPY  2016   RK    Family History  Problem Relation Age of Onset   Stroke Mother 12   Hyperlipidemia Mother    Hypertension Mother    Heart attack Father 84   Hypertension Father    Heart disease Father    Hypertension Brother    Aneurysm Brother        aortic   Hyperlipidemia Brother    Heart disease Brother        aortic aneurysm rupture   Multiple sclerosis Daughter    Alcohol abuse Son    Schizophrenia Son    Bipolar disorder Son    Mental illness Son        schizophrenic, bipolar   Colon cancer Neg Hx    Colon polyps Neg Hx    Esophageal cancer Neg Hx     Rectal cancer Neg Hx    Stomach cancer Neg Hx    Alzheimer's disease Neg Hx     Social History   Socioeconomic History   Marital status: Married    Spouse name: Not on file   Number of children: 4   Years of education: 14   Highest education level: Associate degree: academic program  Occupational History   Occupation: Retired    Comment: Tax inspector  Tobacco Use   Smoking status: Never   Smokeless tobacco: Never  Vaping Use   Vaping status: Never Used  Substance and Sexual Activity   Alcohol use: No   Drug use: No   Sexual activity: Not Currently  Other Topics Concern   Not on file  Social History Narrative   4 children 2 are adopted      Lives at home with husband, children live closeby    Right handed   Caffeine: maybe 1-3 "or more" cups/day   Social Determinants of Health   Financial Resource Strain: Low Risk  (02/22/2022)   Overall Financial Resource Strain (CARDIA)    Difficulty of Paying Living Expenses: Not hard at all  Food Insecurity: No Food Insecurity (02/22/2022)   Hunger Vital Sign    Worried About Running Out of Food in the Last Year: Never true    Ran Out of Food in the Last Year: Never true  Transportation Needs: No Transportation Needs (02/22/2022)   PRAPARE - Administrator, Civil Service (Medical): No    Lack of Transportation (Non-Medical): No  Physical Activity: Inactive (02/22/2022)   Exercise Vital Sign    Days of Exercise per Week: 0 days    Minutes of Exercise per Session: 0 min  Stress: No Stress Concern Present (02/22/2022)   [employer] of Occupational Health - Occupational Stress Questionnaire    Feeling of Stress : Not at all  Social Connections: Socially Integrated (02/22/2022)   Social Connection and Isolation Panel [NHANES]    Frequency of Communication with Friends and Family: More than three times a week    Frequency of Social Gatherings with Friends and Family: More than three times a week    Attends  Religious Services: More than 4 times per year    Active Member of [employer] or Organizations: Yes    Attends Banker Meetings: More than 4 times per year    Marital Status: Married  Catering manager Violence: Not At Risk (02/22/2022)   Humiliation, Afraid, Rape, and Kick questionnaire    Fear of Current or Ex-Partner: No    Emotionally Abused: No    Physically Abused: No  Sexually Abused: No    Outpatient Medications Prior to Visit  Medication Sig Dispense Refill   acetaminophen (TYLENOL) 325 MG tablet Take 325 mg by mouth at bedtime.     amLODipine (NORVASC) 10 MG tablet TAKE 1/2 TABLET TWICE A DAY BY MOUTH 90 tablet 1   aspirin EC 81 MG tablet Take 1 tablet (81 mg total) by mouth daily.     donepezil (ARICEPT) 10 MG tablet Take 1 tablet (10 mg total) by mouth at bedtime. 90 tablet 1   famotidine (PEPCID) 40 MG tablet famotidine 40 mg tablet TAKE 1 TABLET (40 MG TOTAL) BY MOUTH AT BEDTIME AS NEEDED FOR HEARTBURN OR INDIGESTION.     folic acid (FOLVITE) 1 MG tablet TAKE 1 TABLET BY MOUTH EVERY DAY 90 tablet 2   furosemide (LASIX) 20 MG tablet Take 0.5 tablets (10 mg total) by mouth daily as needed for edema. 45 tablet 1   metoprolol succinate (TOPROL-XL) 50 MG 24 hr tablet TAKE 1 TABLET BY MOUTH TWICE A DAY WITH OR IMMEDIATELY FOLLOWING A MEAL 180 tablet 1   Multiple Minerals-Vitamins (CITRACAL PLUS PO) Take 1 tablet by mouth 2 (two) times daily.     rosuvastatin (CRESTOR) 20 MG tablet TAKE 1 TABLET BY MOUTH EVERY DAY 90 tablet 1   trospium (SANCTURA) 20 MG tablet Take 1 tablet (20 mg total) by mouth daily. 90 tablet 1   UNABLE TO FIND COGNITIVE TABLET DAILY     valsartan (DIOVAN) 320 MG tablet TAKE 1 TABLET BY MOUTH EVERY DAY 90 tablet 1   Facility-Administered Medications Prior to Visit  Medication Dose Route Frequency Provider Last Rate Last Admin   cyanocobalamin ((VITAMIN B-12)) injection 1,000 mcg  1,000 mcg Intramuscular Q30 days Zola Button, Myrene Buddy R, DO   1,000 mcg  at 10/27/20 1052    No Known Allergies  Review of Systems  Psychiatric/Behavioral:  The patient has insomnia.       See HPI Objective:    Physical Exam Constitutional:      General: She is not in acute distress.    Appearance: Normal appearance. She is well-developed.  HENT:     Head: Normocephalic and atraumatic.     Right Ear: External ear normal.     Left Ear: External ear normal.  Eyes:     General: No scleral icterus. Neck:     Thyroid: No thyromegaly.  Cardiovascular:     Rate and Rhythm: Normal rate and regular rhythm.     Heart sounds: Normal heart sounds. No murmur heard. Pulmonary:     Effort: Pulmonary effort is normal. No respiratory distress.     Breath sounds: Normal breath sounds. No wheezing.  Musculoskeletal:     Cervical back: Neck supple.  Skin:    General: Skin is warm and dry.  Neurological:     Mental Status: She is alert.     Comments: Some difficulty noted with overall recall  Psychiatric:        Mood and Affect: Mood normal.        Behavior: Behavior normal.        Thought Content: Thought content normal.        Judgment: Judgment normal.      BP (!) 152/68 (BP Location: Left Arm, Patient Position: Sitting, Cuff Size: Normal)   Pulse (!) 50   Temp 98.3 F (36.8 C) (Oral)   Resp 16   Wt 137 lb (62.1 kg)   SpO2 98%   BMI 21.46 kg/m  Wt Readings from Last 3 Encounters:  11/14/22 137 lb (62.1 kg)  09/14/22 135 lb (61.2 kg)  08/23/22 131 lb (59.4 kg)       Assessment & Plan:   Problem List Items Addressed This Visit       Unprioritized   Vitamin D deficiency    Check follow up level.       Relevant Orders   Vitamin D (25 hydroxy) (Completed)   Unintended weight loss - Primary     30 lbs over a year, possibly related to decreased appetite and changes in eating patterns. -Order CT scan of lungs and abdomen/pelvis to rule out underlying malignancy. -Schedule follow-up in 2 months to monitor weight.      Relevant Orders    CT Chest Wo Contrast   CT ABDOMEN PELVIS W CONTRAST   TSH (Completed)   CBC w/Diff (Completed)   Comp Met (CMET) (Completed)   Nightmares     Disturbed sleep due to vivid dreams, possibly related to Aricept (Donepezil). -Hold Aricept for two weeks to assess if dreams improve. -Consider alternative cognitive medications if dreams improve off Aricept. -Pt to check in via MyChart in 1-2 weeks to report sleep quality.  Family stress could also be playing a role.  Offered a referral to counseling but she declines.      B12 deficiency   Relevant Orders   B12 (Completed)    I am having Erika [name] maintain her Multiple Minerals-Vitamins (CITRACAL PLUS PO), aspirin EC, UNABLE TO FIND, furosemide, folic acid, acetaminophen, valsartan, rosuvastatin, famotidine, trospium, amLODipine, donepezil, and metoprolol succinate. We will continue to administer cyanocobalamin.  No orders of the defined types were placed in this encounter.

## 2022-11-15 NOTE — Assessment & Plan Note (Addendum)
  Disturbed sleep due to vivid dreams, possibly related to Aricept (Donepezil). -Hold Aricept for two weeks to assess if dreams improve. -Consider alternative cognitive medications if dreams improve off Aricept. -Pt to check in via MyChart in 1-2 weeks to report sleep quality.  Family stress could also be playing a role.  Offered a referral to counseling but she declines.

## 2022-11-15 NOTE — Assessment & Plan Note (Signed)
Check follow up level.

## 2022-11-16 NOTE — Telephone Encounter (Signed)
Results given to patient and her husband. Was scheduled for the first 4 weely B12s.

## 2022-11-23 ENCOUNTER — Ambulatory Visit (INDEPENDENT_AMBULATORY_CARE_PROVIDER_SITE_OTHER): Payer: PPO

## 2022-11-23 DIAGNOSIS — E538 Deficiency of other specified B group vitamins: Secondary | ICD-10-CM | POA: Diagnosis not present

## 2022-11-23 MED ORDER — CYANOCOBALAMIN 1000 MCG/ML IJ SOLN
1000.0000 ug | Freq: Once | INTRAMUSCULAR | Status: AC
Start: 2022-11-23 — End: 2022-11-23
  Administered 2022-11-23: 1000 ug via INTRAMUSCULAR

## 2022-11-23 NOTE — Progress Notes (Signed)
Pt here for 1/4 weekly B12 injection per   B12 given IM L deltoid, and pt tolerated injection well.  Next B12 injection scheduled for 11/30/2022.

## 2022-11-25 ENCOUNTER — Ambulatory Visit (HOSPITAL_BASED_OUTPATIENT_CLINIC_OR_DEPARTMENT_OTHER)
Admission: RE | Admit: 2022-11-25 | Discharge: 2022-11-25 | Disposition: A | Discharge status: Home / Self Care | Payer: PPO | Source: Ambulatory Visit | Attending: Family | Admitting: Family

## 2022-11-25 DIAGNOSIS — R634 Abnormal weight loss: Secondary | ICD-10-CM | POA: Insufficient documentation

## 2022-11-25 DIAGNOSIS — R918 Other nonspecific abnormal finding of lung field: Secondary | ICD-10-CM | POA: Insufficient documentation

## 2022-11-25 DIAGNOSIS — I700 Atherosclerosis of aorta: Secondary | ICD-10-CM | POA: Diagnosis not present

## 2022-11-25 DIAGNOSIS — K5730 Diverticulosis of large intestine without perforation or abscess without bleeding: Secondary | ICD-10-CM | POA: Insufficient documentation

## 2022-11-25 DIAGNOSIS — K8020 Calculus of gallbladder without cholecystitis without obstruction: Secondary | ICD-10-CM | POA: Insufficient documentation

## 2022-11-25 DIAGNOSIS — K449 Diaphragmatic hernia without obstruction or gangrene: Secondary | ICD-10-CM | POA: Diagnosis not present

## 2022-11-25 DIAGNOSIS — E041 Nontoxic single thyroid nodule: Secondary | ICD-10-CM | POA: Diagnosis not present

## 2022-11-25 DIAGNOSIS — I3139 Other pericardial effusion (noninflammatory): Secondary | ICD-10-CM | POA: Diagnosis not present

## 2022-11-25 MED ORDER — IOHEXOL 300 MG/ML  SOLN
100.0000 mL | Freq: Once | INTRAMUSCULAR | Status: AC | PRN
  Administered 2022-11-25: 100 mL via INTRAVENOUS

## 2022-11-27 ENCOUNTER — Inpatient Hospital Stay: Payer: PPO | Admitting: Medical Oncology

## 2022-11-27 ENCOUNTER — Other Ambulatory Visit: Payer: Self-pay

## 2022-11-27 ENCOUNTER — Inpatient Hospital Stay: Payer: PPO | Attending: Family

## 2022-11-27 ENCOUNTER — Encounter: Payer: Self-pay | Admitting: Medical Oncology

## 2022-11-27 VITALS — BP 150/49 | HR 85 | Temp 98.3°F | Resp 18 | Ht 67.0 in | Wt 134.0 lb

## 2022-11-27 DIAGNOSIS — R7983 Abnormal findings of blood amino-acid level: Secondary | ICD-10-CM | POA: Insufficient documentation

## 2022-11-27 DIAGNOSIS — D529 Folate deficiency anemia, unspecified: Secondary | ICD-10-CM | POA: Diagnosis not present

## 2022-11-27 DIAGNOSIS — D509 Iron deficiency anemia, unspecified: Secondary | ICD-10-CM | POA: Diagnosis not present

## 2022-11-27 DIAGNOSIS — E538 Deficiency of other specified B group vitamins: Secondary | ICD-10-CM | POA: Diagnosis not present

## 2022-11-27 LAB — RETICULOCYTES
Immature Retic Fract: 8.3 % (ref 2.3–15.9)
RBC.: 4.07 MIL/uL (ref 3.87–5.11)
Retic Count, Absolute: 57 10*3/uL (ref 19.0–186.0)
Retic Ct Pct: 1.4 % (ref 0.4–3.1)

## 2022-11-27 LAB — CBC WITH DIFFERENTIAL (CANCER CENTER ONLY)
Abs Immature Granulocytes: 0.04 K/uL (ref 0.00–0.07)
Basophils Absolute: 0.1 K/uL (ref 0.0–0.1)
Basophils Relative: 2 %
Eosinophils Absolute: 0.2 K/uL (ref 0.0–0.5)
Eosinophils Relative: 4 %
HCT: 38.7 % (ref 36.0–46.0)
Hemoglobin: 13.3 g/dL (ref 12.0–15.0)
Immature Granulocytes: 1 %
Lymphocytes Relative: 32 %
Lymphs Abs: 1.6 K/uL (ref 0.7–4.0)
MCH: 32.5 pg (ref 26.0–34.0)
MCHC: 34.4 g/dL (ref 30.0–36.0)
MCV: 94.6 fL (ref 80.0–100.0)
Monocytes Absolute: 0.6 K/uL (ref 0.1–1.0)
Monocytes Relative: 11 %
Neutro Abs: 2.5 K/uL (ref 1.7–7.7)
Neutrophils Relative %: 50 %
Platelet Count: 186 K/uL (ref 150–400)
RBC: 4.09 MIL/uL (ref 3.87–5.11)
RDW: 11.1 % — ABNORMAL LOW (ref 11.5–15.5)
WBC Count: 4.9 K/uL (ref 4.0–10.5)
nRBC: 0 % (ref 0.0–0.2)

## 2022-11-27 LAB — IRON AND IRON BINDING CAPACITY (CC-WL,HP ONLY)
Iron: 94 ug/dL (ref 28–170)
Saturation Ratios: 27 % (ref 10.4–31.8)
TIBC: 354 ug/dL (ref 250–450)
UIBC: 260 ug/dL (ref 148–442)

## 2022-11-27 LAB — FERRITIN: Ferritin: 28 ng/mL (ref 11–307)

## 2022-11-27 NOTE — Progress Notes (Addendum)
Hematology and Oncology Follow Up Visit  Erika [name] [id number] 1945-06-14 77 y.o. 11/27/2022   Principle Diagnosis:  Iron deficiency anemia  Elevated homocystine level    Current Therapy:        IV iron as indicated  Folic acid 1 mg PO Daily   Interim History:  Ms. Checchi is here today for follow-up. She is doing fairly well.   Her dementia has worsened a bit after her Aricept was stopped due to nightmares.   Daughter and grandson have moved in with them which has been nice but also a change.   No fever, chills, n/v, cough, rash, dizziness, SOB, chest pain, palpitations, abdominal pain or changes in bowel or bladder habits at this time.  No blood loss noted. No bruising or petechiae.  No swelling, tenderness, numbness or tingling in her extremities.  No falls or syncope.  Appetite and hydration are fair. She is really working hard on eating well throughout the day. Her family is encouraging her.   Wt Readings from Last 3 Encounters:  11/27/22 134 lb (60.8 kg)  11/14/22 137 lb (62.1 kg)  09/14/22 135 lb (61.2 kg)     ECOG Performance Status: 1 - Symptomatic but completely ambulatory  Medications:  Allergies as of 11/27/2022   No Known Allergies      Medication List        Accurate as of November 27, 2022 10:23 AM. If you have any questions, ask your nurse or doctor.          acetaminophen 325 MG tablet Commonly known as: TYLENOL Take 325 mg by mouth at bedtime.   amLODipine 10 MG tablet Commonly known as: NORVASC TAKE 1/2 TABLET TWICE A DAY BY MOUTH   aspirin EC 81 MG tablet Take 1 tablet (81 mg total) by mouth daily.   CITRACAL PLUS PO Take 1 tablet by mouth 2 (two) times daily.   donepezil 10 MG tablet Commonly known as: ARICEPT Take 1 tablet (10 mg total) by mouth at bedtime.   famotidine 40 MG tablet Commonly known as: PEPCID famotidine 40 mg tablet TAKE 1 TABLET (40 MG TOTAL) BY MOUTH AT BEDTIME AS NEEDED FOR HEARTBURN OR  INDIGESTION.   folic acid 1 MG tablet Commonly known as: FOLVITE TAKE 1 TABLET BY MOUTH EVERY DAY   furosemide 20 MG tablet Commonly known as: LASIX Take 0.5 tablets (10 mg total) by mouth daily as needed for edema.   metoprolol succinate 50 MG 24 hr tablet Commonly known as: TOPROL-XL TAKE 1 TABLET BY MOUTH TWICE A DAY WITH OR IMMEDIATELY FOLLOWING A MEAL   rosuvastatin 20 MG tablet Commonly known as: CRESTOR TAKE 1 TABLET BY MOUTH EVERY DAY   trospium 20 MG tablet Commonly known as: SANCTURA Take 1 tablet (20 mg total) by mouth daily.   UNABLE TO FIND COGNITIVE TABLET DAILY   valsartan 320 MG tablet Commonly known as: DIOVAN TAKE 1 TABLET BY MOUTH EVERY DAY        Allergies: No Known Allergies  Past Medical History, Surgical history, Social history, and Family History were reviewed and updated.  Review of Systems: All other 10 point review of systems is negative.   Physical Exam:  height is 5\' 7"  (1.702 m) and weight is 134 lb (60.8 kg). Her oral temperature is 98.3 F (36.8 C). Her blood pressure is 150/49 (abnormal) and her pulse is 85. Her respiration is 18 and oxygen saturation is 100%.   Wt Readings from Last 3 Encounters:  11/27/22 134 lb (60.8 kg)  11/14/22 137 lb (62.1 kg)  09/14/22 135 lb (61.2 kg)    Ocular: Sclerae unicteric, pupils equal, round and reactive to light Ear-nose-throat: Oropharynx clear, dentition fair Lymphatic: No cervical or supraclavicular adenopathy Lungs no rales or rhonchi, good excursion bilaterally Heart regular rate and rhythm, no murmur appreciated Abd soft, nontender, positive bowel sounds MSK no focal spinal tenderness, no joint edema Neuro: non-focal, well-oriented, appropriate affect  Lab Results  Component Value Date   WBC 4.9 11/27/2022   HGB 13.3 11/27/2022   HCT 38.7 11/27/2022   MCV 94.6 11/27/2022   PLT 186 11/27/2022   Lab Results  Component Value Date   FERRITIN 34 05/29/2022   IRON 115 05/29/2022    TIBC 361 05/29/2022   UIBC 246 05/29/2022   IRONPCTSAT 32 (H) 05/29/2022   Lab Results  Component Value Date   RETICCTPCT 1.4 11/27/2022   RBC 4.07 11/27/2022   RBC 4.09 11/27/2022   No results found for: "KPAFRELGTCHN", "LAMBDASER", "KAPLAMBRATIO" No results found for: "IGGSERUM", "IGA", "IGMSERUM" No results found for: "TOTALPROTELP", "ALBUMINELP", "A1GS", "A2GS", "BETS", "BETA2SER", "GAMS", "MSPIKE", "SPEI"   Chemistry      Component Value Date/Time   NA 143 11/14/2022 1432   K 3.5 11/14/2022 1432   CL 102 11/14/2022 1432   CO2 32 11/14/2022 1432   BUN 13 11/14/2022 1432   CREATININE 1.07 11/14/2022 1432   CREATININE 1.19 (H) 12/28/2020 0846   CREATININE 1.23 (H) 11/18/2019 0932      Component Value Date/Time   CALCIUM 10.0 11/14/2022 1432   ALKPHOS 66 11/14/2022 1432   AST 18 11/14/2022 1432   AST 15 12/28/2020 0846   ALT 13 11/14/2022 1432   ALT 11 12/28/2020 0846   BILITOT 0.9 11/14/2022 1432   BILITOT 0.7 12/28/2020 0846       Impression and Plan: Ms. Littell is a very pleasant 77 yo caucasian female with history of multifactorial anemia including iron deficiency and B 12 deficiency (managed by PCP). She also had a slightly elevated homocystine level.  She will continue her Folic acid PO daily.  Hgb is stable at 13.3. MCV is normal at 94.6. Iron studies are pending. We will replace if needed.    RTC 12 months (Pt request- PCP to check labs every 3-6 months)APP, labs ( CBC , BMP, retic, iron, ferritin, homocysteine)  Rushie Chestnut, PA-C 9/23/202410:23 AM

## 2022-11-28 LAB — HOMOCYSTEINE: Homocysteine: 8.5 umol/L (ref 0.0–19.2)

## 2022-11-30 ENCOUNTER — Ambulatory Visit: Payer: PPO

## 2022-11-30 DIAGNOSIS — E538 Deficiency of other specified B group vitamins: Secondary | ICD-10-CM

## 2022-11-30 MED ORDER — CYANOCOBALAMIN 1000 MCG/ML IJ SOLN
1000.0000 ug | Freq: Once | INTRAMUSCULAR | Status: AC
Start: 2022-11-30 — End: 2022-11-30
  Administered 2022-11-30: 1000 ug via INTRAMUSCULAR

## 2022-11-30 NOTE — Progress Notes (Signed)
Erika [name] is a 77 y.o. female presents to the office today for 2/4 weekly B12  injections, per physician's orders. Original order: 11/15/22 "B12 level is low. Start b12 injections IM weekly x 4 then monthly. " Cyanocobalamin 1000 mg/ml IM was administered left deltoid today. Patient tolerated injection. Patient due for follow up labs/provider appt: No.  Patient next injection due: 1 week for 3/4 Weekly B12 injection, appt made NO (#3/4 and 4/ 4 are scheduled already).  Sima Matas CMA

## 2022-12-01 ENCOUNTER — Other Ambulatory Visit: Payer: Self-pay | Admitting: Family Medicine

## 2022-12-07 ENCOUNTER — Ambulatory Visit: Payer: PPO

## 2022-12-07 DIAGNOSIS — E538 Deficiency of other specified B group vitamins: Secondary | ICD-10-CM | POA: Diagnosis not present

## 2022-12-07 MED ORDER — CYANOCOBALAMIN 1000 MCG/ML IJ SOLN
1000.0000 ug | Freq: Once | INTRAMUSCULAR | Status: AC
Start: 2022-12-07 — End: 2022-12-07
  Administered 2022-12-07: 1000 ug via INTRAMUSCULAR

## 2022-12-07 NOTE — Progress Notes (Signed)
Pt here for monthly B12 injection per PCP orders.   B12 given IM in left deltoid, and pt tolerated injection well.  Next B12 injection scheduled for 1 week.   Erika [name]

## 2022-12-08 ENCOUNTER — Other Ambulatory Visit: Payer: Self-pay | Admitting: Medical Oncology

## 2022-12-10 ENCOUNTER — Other Ambulatory Visit: Payer: Self-pay | Admitting: Hematology & Oncology

## 2022-12-10 DIAGNOSIS — D529 Folate deficiency anemia, unspecified: Secondary | ICD-10-CM

## 2022-12-10 DIAGNOSIS — R7983 Abnormal findings of blood amino-acid level: Secondary | ICD-10-CM

## 2022-12-12 ENCOUNTER — Telehealth: Payer: Self-pay | Admitting: *Deleted

## 2022-12-12 NOTE — Telephone Encounter (Signed)
Per results message Maralyn Sago - called patient and lvm for a call back to schedule (1) dose of IV Iron.

## 2022-12-14 ENCOUNTER — Ambulatory Visit: Payer: PPO

## 2022-12-17 ENCOUNTER — Telehealth: Payer: Self-pay | Admitting: Family

## 2022-12-17 DIAGNOSIS — E041 Nontoxic single thyroid nodule: Secondary | ICD-10-CM | POA: Insufficient documentation

## 2022-12-17 NOTE — Telephone Encounter (Signed)
Incidental finding noted of thyroid nodule.  I would recommend that she have a thyroid ultrasound to further evaluate this nodule.   There is note of a couple of small lung nodules which do not appear worrisome.

## 2022-12-18 ENCOUNTER — Inpatient Hospital Stay: Payer: PPO | Attending: Family

## 2022-12-18 VITALS — BP 141/52 | HR 51 | Temp 98.9°F | Resp 17

## 2022-12-18 DIAGNOSIS — R7983 Abnormal findings of blood amino-acid level: Secondary | ICD-10-CM | POA: Diagnosis not present

## 2022-12-18 DIAGNOSIS — E538 Deficiency of other specified B group vitamins: Secondary | ICD-10-CM | POA: Diagnosis not present

## 2022-12-18 DIAGNOSIS — D509 Iron deficiency anemia, unspecified: Secondary | ICD-10-CM | POA: Insufficient documentation

## 2022-12-18 MED ORDER — SODIUM CHLORIDE 0.9 % IV SOLN: Freq: Once | INTRAVENOUS | Status: AC

## 2022-12-18 MED ORDER — SODIUM CHLORIDE 0.9 % IV SOLN
1000.0000 mg | Freq: Once | INTRAVENOUS | Status: AC
  Administered 2022-12-18: 1000 mg via INTRAVENOUS
  Filled 2022-12-18: qty 10

## 2022-12-18 NOTE — Telephone Encounter (Signed)
Pt's husband returned Ruth's call.

## 2022-12-18 NOTE — Telephone Encounter (Signed)
Called patient but no answer, left voice mail for patient to call back.

## 2022-12-18 NOTE — Patient Instructions (Signed)

## 2022-12-18 NOTE — Telephone Encounter (Signed)
Patient's husband notified of findings and that they will receive a call from radiology to set up [place] appointment

## 2022-12-21 ENCOUNTER — Ambulatory Visit: Payer: PPO

## 2022-12-21 DIAGNOSIS — E538 Deficiency of other specified B group vitamins: Secondary | ICD-10-CM

## 2022-12-21 MED ORDER — CYANOCOBALAMIN 1000 MCG/ML IJ SOLN
1000.0000 ug | Freq: Once | INTRAMUSCULAR | Status: AC
Start: 2022-12-21 — End: 2022-12-21
  Administered 2022-12-21: 1000 ug via INTRAMUSCULAR

## 2022-12-21 NOTE — Progress Notes (Signed)
Pt here for weekly 4/4  B12 injection per Dr. Abner Greenspan  B12 given L deltoid IM, and pt tolerated injection well.  Next B12 injection scheduled for 1 month.

## 2022-12-27 ENCOUNTER — Telehealth: Payer: Self-pay | Admitting: Family Medicine

## 2022-12-27 ENCOUNTER — Other Ambulatory Visit: Payer: Self-pay | Admitting: Emergency Medicine

## 2022-12-27 DIAGNOSIS — F0390 Unspecified dementia without behavioral disturbance: Secondary | ICD-10-CM

## 2022-12-27 MED ORDER — DONEPEZIL HCL 10 MG PO TABS: 10.0000 mg | ORAL_TABLET | Freq: Every day | ORAL | 1 refills | Status: DC

## 2022-12-27 NOTE — Telephone Encounter (Signed)
Erika [name] (spouse DPR OK) called stating that pt was not taking the following medication per Dr. Mariel Aloe advice but she has started having memory issues. He stated pt would like to start taking it again:   donepezil (ARICEPT) 10 MG tablet [[id number]]

## 2022-12-27 NOTE — Telephone Encounter (Signed)
Refill sent to pharmacy.   

## 2022-12-28 ENCOUNTER — Ambulatory Visit (HOSPITAL_BASED_OUTPATIENT_CLINIC_OR_DEPARTMENT_OTHER)
Admission: RE | Admit: 2022-12-28 | Discharge: 2022-12-28 | Disposition: A | Discharge status: Home / Self Care | Payer: PPO | Source: Ambulatory Visit | Attending: Family | Admitting: Family

## 2022-12-28 DIAGNOSIS — E041 Nontoxic single thyroid nodule: Secondary | ICD-10-CM | POA: Diagnosis not present

## 2022-12-28 DIAGNOSIS — E042 Nontoxic multinodular goiter: Secondary | ICD-10-CM | POA: Diagnosis not present

## 2023-01-07 ENCOUNTER — Telehealth: Payer: Self-pay | Admitting: Family

## 2023-01-07 DIAGNOSIS — E041 Nontoxic single thyroid nodule: Secondary | ICD-10-CM

## 2023-01-07 NOTE — Telephone Encounter (Signed)
Windell Moulding, please contact pt's spouse and let him know that she has a fairly large thyroid nodule and it is recommended that she have a needle biopsy of this for further evaluation. This will be done at Interventional radiology at [facility].  They should reach out to them about scheduling.    I would also like to order some additional thyroid function blood tests and have placed future orders.  Please schedule a lab appointment.   Dr. Abner Greenspan- FYI.  tks

## 2023-01-08 NOTE — Telephone Encounter (Signed)
Pt's husband called back.

## 2023-01-08 NOTE — Telephone Encounter (Signed)
Called patient but no answer, left voice mail for patient to call back.   

## 2023-01-08 NOTE — Telephone Encounter (Signed)
Called patient's husband back and notified of information, he verbalized understanding and will be aware of his phone for when IR calls.  She was scheduled to come in tomorrow for additional labs.

## 2023-01-09 ENCOUNTER — Other Ambulatory Visit (INDEPENDENT_AMBULATORY_CARE_PROVIDER_SITE_OTHER): Payer: PPO

## 2023-01-09 DIAGNOSIS — E041 Nontoxic single thyroid nodule: Secondary | ICD-10-CM

## 2023-01-09 LAB — T3, FREE: T3, Free: 3.7 pg/mL (ref 2.3–4.2)

## 2023-01-09 LAB — TSH: TSH: 0.6 u[IU]/mL (ref 0.35–5.50)

## 2023-01-09 LAB — T4, FREE: Free T4: 1.09 ng/dL (ref 0.60–1.60)

## 2023-01-15 ENCOUNTER — Encounter: Payer: Self-pay | Admitting: Gastroenterology

## 2023-01-23 ENCOUNTER — Ambulatory Visit (INDEPENDENT_AMBULATORY_CARE_PROVIDER_SITE_OTHER): Payer: PPO

## 2023-01-23 DIAGNOSIS — E538 Deficiency of other specified B group vitamins: Secondary | ICD-10-CM

## 2023-01-23 NOTE — Progress Notes (Signed)
Erika [name] is a 77 y.o. female presents to the office today for 1st Monthly B12 injection, per physician's orders. Original order: Macario Carls- 11/15/22: "B12 level is low. Start b12 injections IM weekly x 4 then monthly. " Cyanocobalamin 1000 mg/ml IM was administered R deltoid today. Patient tolerated injection. Patient due for follow up labs/provider appt: No.  Patient next injection due: 1 month, appt made Yes- 03/13/23 per pt request due to holiday season.   Creft, Feliberto Harts

## 2023-01-26 ENCOUNTER — Other Ambulatory Visit: Payer: Self-pay | Admitting: Family Medicine

## 2023-01-26 DIAGNOSIS — F0390 Unspecified dementia without behavioral disturbance: Secondary | ICD-10-CM

## 2023-02-18 ENCOUNTER — Encounter: Payer: Self-pay | Admitting: Family Medicine

## 2023-02-19 ENCOUNTER — Other Ambulatory Visit: Payer: Self-pay | Admitting: Family Medicine

## 2023-02-19 MED ORDER — AMLODIPINE BESYLATE 5 MG PO TABS: 5.0000 mg | ORAL_TABLET | Freq: Two times a day (BID) | ORAL | 1 refills | Status: DC

## 2023-03-05 ENCOUNTER — Ambulatory Visit: Payer: PPO | Admitting: Family Medicine

## 2023-03-05 ENCOUNTER — Ambulatory Visit
Admission: RE | Admit: 2023-03-05 | Discharge: 2023-03-05 | Disposition: A | Discharge status: Home / Self Care | Payer: PPO | Source: Ambulatory Visit | Attending: Family | Admitting: Family

## 2023-03-05 ENCOUNTER — Other Ambulatory Visit (HOSPITAL_COMMUNITY)
Admission: RE | Admit: 2023-03-05 | Discharge: 2023-03-05 | Disposition: A | Discharge status: Home / Self Care | Payer: PPO | Source: Ambulatory Visit | Attending: Interventional Radiology | Admitting: Interventional Radiology

## 2023-03-05 DIAGNOSIS — E041 Nontoxic single thyroid nodule: Secondary | ICD-10-CM | POA: Insufficient documentation

## 2023-03-08 ENCOUNTER — Encounter: Payer: Self-pay | Admitting: Family Medicine

## 2023-03-08 ENCOUNTER — Ambulatory Visit: Payer: PPO | Admitting: Family Medicine

## 2023-03-08 VITALS — BP 150/57 | HR 50 | Ht 67.0 in | Wt 136.0 lb

## 2023-03-08 DIAGNOSIS — D509 Iron deficiency anemia, unspecified: Secondary | ICD-10-CM | POA: Diagnosis not present

## 2023-03-08 DIAGNOSIS — R944 Abnormal results of kidney function studies: Secondary | ICD-10-CM | POA: Diagnosis not present

## 2023-03-08 DIAGNOSIS — K644 Residual hemorrhoidal skin tags: Secondary | ICD-10-CM | POA: Diagnosis not present

## 2023-03-08 DIAGNOSIS — I10 Essential (primary) hypertension: Secondary | ICD-10-CM

## 2023-03-08 DIAGNOSIS — E538 Deficiency of other specified B group vitamins: Secondary | ICD-10-CM

## 2023-03-08 LAB — COMPREHENSIVE METABOLIC PANEL
ALT: 13 U/L (ref 0–35)
AST: 18 U/L (ref 0–37)
Albumin: 4.6 g/dL (ref 3.5–5.2)
Alkaline Phosphatase: 74 U/L (ref 39–117)
BUN: 16 mg/dL (ref 6–23)
CO2: 32 meq/L (ref 19–32)
Calcium: 9.9 mg/dL (ref 8.4–10.5)
Chloride: 104 meq/L (ref 96–112)
Creatinine, Ser: 0.92 mg/dL (ref 0.40–1.20)
GFR: 60.21 mL/min (ref >60.00)
Glucose, Bld: 79 mg/dL (ref 70–99)
Potassium: 3.5 meq/L (ref 3.5–5.1)
Sodium: 145 meq/L (ref 135–145)
Total Bilirubin: 1 mg/dL (ref 0.2–1.2)
Total Protein: 7 g/dL (ref 6.0–8.3)

## 2023-03-08 LAB — CBC WITH DIFFERENTIAL/PLATELET
Basophils Absolute: 0.1 10*3/uL (ref 0.0–0.1)
Basophils Relative: 1.5 % (ref 0.0–3.0)
Eosinophils Absolute: 0.2 10*3/uL (ref 0.0–0.7)
Eosinophils Relative: 4.1 % (ref 0.0–5.0)
HCT: 44.1 % (ref 36.0–46.0)
Hemoglobin: 14.9 g/dL (ref 12.0–15.0)
Lymphocytes Relative: 31.5 % (ref 12.0–46.0)
Lymphs Abs: 1.9 10*3/uL (ref 0.7–4.0)
MCHC: 33.8 g/dL (ref 30.0–36.0)
MCV: 98.1 fL (ref 78.0–100.0)
Monocytes Absolute: 0.8 10*3/uL (ref 0.1–1.0)
Monocytes Relative: 14 % — ABNORMAL HIGH (ref 3.0–12.0)
Neutro Abs: 2.9 10*3/uL (ref 1.4–7.7)
Neutrophils Relative %: 48.9 % (ref 43.0–77.0)
Platelets: 208 10*3/uL (ref 150.0–400.0)
RBC: 4.49 Mil/uL (ref 3.87–5.11)
RDW: 12.4 % (ref 11.5–15.5)
WBC: 6 10*3/uL (ref 4.0–10.5)

## 2023-03-08 LAB — IBC + FERRITIN
Ferritin: 214.6 ng/mL (ref 10.0–291.0)
Iron: 152 ug/dL — ABNORMAL HIGH (ref 42–145)
Saturation Ratios: 46.2 % (ref 20.0–50.0)
TIBC: 329 ug/dL (ref 250.0–450.0)
Transferrin: 235 mg/dL (ref 212.0–360.0)

## 2023-03-08 LAB — B12 AND FOLATE PANEL
Folate: >24.2 ng/mL (ref >5.9)
Vitamin B-12: 422 pg/mL (ref 211–911)

## 2023-03-08 LAB — CYTOLOGY - NON PAP

## 2023-03-08 MED ORDER — HYDROCORTISONE (PERIANAL) 2.5 % EX CREA: 1.0000 | TOPICAL_CREAM | Freq: Two times a day (BID) | CUTANEOUS | 0 refills | Status: AC

## 2023-03-08 NOTE — Progress Notes (Signed)
 Established Patient Office Visit  Subjective   Patient ID: Erika [name], female    DOB: 1945/12/15  Age: 78 y.o. MRN: [id number]  Chief Complaint  Patient presents with   Medical Management of Chronic Issues    HPI  Discussed the use of AI scribe software for clinical note transcription with the patient, who gave verbal consent to proceed.  History of Present Illness   The patient presented for a follow-up visit. She had recently experienced weight loss of approximately 10-12 pounds over a few months, prompting a series of investigations - thyroid  nodule was found and biopsied earlier this week, no results available at this time. The patient has since regained some weight, with a net gain of 2 pounds since the last visit in September.  The patient has been receiving B12 injections, initially weekly for four weeks and then monthly, starting in September.   The patient also reported a new symptom of discomfort in the rectal area, which has been present for several weeks. The patient described it as a sore that causes pain when touched or sat on. The patient denied any drainage from the sore.  The patient's blood pressure was noted to be high during the visit, although she reported that it is usually high. The patient's heart rate was also noted to be on the lower side, which has been a consistent finding in previous visits.          Wt Readings from Last 3 Encounters:  03/08/23 136 lb (61.7 kg)  11/27/22 134 lb (60.8 kg)  11/14/22 137 lb (62.1 kg)        ROS All review of systems negative except what is listed in the HPI    Objective:     BP (!) 150/57   Pulse (!) 50   Ht 5' 7 (1.702 m)   Wt 136 lb (61.7 kg)   SpO2 98%   BMI 21.30 kg/m    Physical Exam Vitals reviewed.  Constitutional:      Appearance: Normal appearance.  Cardiovascular:     Rate and Rhythm: Normal rate and regular rhythm.     Heart sounds: Normal heart sounds.  Pulmonary:     Effort:  Pulmonary effort is normal.     Breath sounds: Normal breath sounds.  Genitourinary:    Rectum: External hemorrhoid present.  Skin:    General: Skin is warm and dry.  Neurological:     Mental Status: She is alert and oriented to person, place, and time.  Psychiatric:        Mood and Affect: Mood normal.        Behavior: Behavior normal.        Judgment: Judgment normal.     Comments: Some trouble with recall      No results found for any visits on 03/08/23.    The 10-year ASCVD risk score (Arnett DK, et al., 2019) is: 32.9%    Assessment & Plan:   Problem List Items Addressed This Visit       Active Problems   Hypertension   Blood pressure elevated today, but patient reports it is usually high. Baseline appears to be in the 140s. -Check blood pressure at home and report if consistently above 150s. -Consider medication adjustment if blood pressure remains high.      B12 deficiency   Low B12 levels identified, currently receiving monthly B12 injections. -Check B12 levels today to ensure improvement.      Relevant Orders  B12 and Folate Panel   CBC with Differential/Platelet   IDA (iron deficiency anemia)   Followed by Hematology for iron levels, last checked in September and were normal. -Check iron levels today.       Relevant Orders   IBC + Ferritin   CBC with Differential/Platelet   Other Visit Diagnoses       External hemorrhoid    -  Primary   Relevant Medications   hydrocortisone  (ANUSOL -HC) 2.5 % rectal cream     Decreased GFR       Relevant Orders   Comprehensive metabolic panel     Unintentional Weight Loss Rapid weight loss of 10-12 pounds, now stabilized with a slight gain. No current concerns. -Continue monitoring weight.  Thyroid  Nodule Identified on CT chest, ultrasound performed, and biopsy taken recently. Results pending. -Await biopsy results.  Hemorrhoid New onset, causing discomfort but no drainage. Likely due to prolonged  sitting. -Apply prescribed hydrocortisone  cream twice daily. -Avoid constipation and prolonged sitting on the toilet. -Consider use of a donut pillow to offload pressure.  Return in about 3 months (around 06/06/2023) for routine follow-up.    Waddell KATHEE Mon, NP

## 2023-03-08 NOTE — Patient Instructions (Signed)
Avoid prolonged sitting.  Avoid straining with bowel movements.  Increase high fiber food intake.   Participate in regular exercise. Bulk forming agents: Metamucil, Citrucel, Fibercon Stool softeners: Colace Irritation/Itching relief: OTC Anusol, Preparation H, hydrocortisone suppositories, warm sitz baths

## 2023-03-08 NOTE — Assessment & Plan Note (Signed)
 Low B12 levels identified, currently receiving monthly B12 injections. -Check B12 levels today to ensure improvement.

## 2023-03-08 NOTE — Assessment & Plan Note (Signed)
 Blood pressure elevated today, but patient reports it is usually high. Baseline appears to be in the 140s. -Check blood pressure at home and report if consistently above 150s. -Consider medication adjustment if blood pressure remains high.

## 2023-03-08 NOTE — Assessment & Plan Note (Signed)
 Followed by Hematology for iron levels, last checked in September and were normal. -Check iron levels today.

## 2023-03-11 ENCOUNTER — Telehealth: Payer: Self-pay | Admitting: Family

## 2023-03-11 DIAGNOSIS — E041 Nontoxic single thyroid nodule: Secondary | ICD-10-CM

## 2023-03-11 NOTE — Telephone Encounter (Signed)
 Please contact pt and let her know that the thyroid  biopsy shows normal cells but there was not really a good sample, very few cells are evaluated.  I reviewed this with Dr. Domenica.  We would recommend that she meet with a surgeon to further discuss the results and ultrasound.

## 2023-03-11 NOTE — Telephone Encounter (Signed)
-----   Message from Harlene Horton sent at 03/11/2023  8:53 AM EST ----- This is a tough one. Might do either general surgery consultation to discuss surgical options vs surveillance +/- endocrinology ----- Message ----- From: Daryl Setter, NP Sent: 03/09/2023  12:10 PM EST To: Setter Daryl, NP; Harlene DELENA Horton, MD  What do you think about this result?  I was thinking refer to Endo?  Let me know your thoughts please. ----- Message ----- From: Interface, [facility] Sent: 03/08/2023   2:44 PM EST To: Setter Daryl, NP

## 2023-03-12 NOTE — Telephone Encounter (Signed)
 Patient notified of this information and referral to GS

## 2023-03-13 ENCOUNTER — Ambulatory Visit: Payer: PPO

## 2023-03-13 ENCOUNTER — Ambulatory Visit (INDEPENDENT_AMBULATORY_CARE_PROVIDER_SITE_OTHER): Payer: PPO

## 2023-03-13 VITALS — Ht 67.0 in | Wt 136.0 lb

## 2023-03-13 DIAGNOSIS — Z0000 Encounter for general adult medical examination without abnormal findings: Secondary | ICD-10-CM | POA: Diagnosis not present

## 2023-03-13 DIAGNOSIS — E538 Deficiency of other specified B group vitamins: Secondary | ICD-10-CM

## 2023-03-13 NOTE — Progress Notes (Signed)
 Subjective:   Erika [name] is a 78 y.o. female who presents for Medicare Annual (Subsequent) preventive examination.  Visit Complete: Virtual I connected with  Erika [name] on 03/13/23 by a audio enabled telemedicine application and verified that I am speaking with the correct person using two identifiers.  Patient Location: Home  Provider Location: Home Office  I discussed the limitations of evaluation and management by telemedicine. The patient expressed understanding and agreed to proceed.  Vital Signs: Because this visit was a virtual/telehealth visit, some criteria may be missing or patient reported. Any vitals not documented were not able to be obtained and vitals that have been documented are patient reported.    Cardiac Risk Factors include: advanced age (>63men, >37 women);hypertension     Objective:    Today's Vitals   03/13/23 1133  Weight: 136 lb (61.7 kg)  Height: 5' 7 (1.702 m)   Body mass index is 21.3 kg/m.     03/13/2023   11:40 AM 11/27/2022   10:05 AM 05/29/2022    9:55 AM 02/22/2022    9:42 AM 11/28/2021    9:05 AM 03/31/2021    9:45 AM 03/02/2021    2:09 PM  Advanced Directives  Does Patient Have a Medical Advance Directive? Yes Yes Yes Yes Yes Yes Yes  Type of Estate Agent of [place];Living will Living will;Healthcare Power of [employer] Power of [place];Living will Healthcare Power of [place];Living will Healthcare Power of [place];Living will Healthcare Power of [place];Living will Living will;Healthcare Power of Attorney  Does patient want to make changes to medical advance directive?  No - Patient declined  No - Patient declined No - Patient declined  No - Patient declined  Copy of Healthcare Power of Attorney in Chart? No - copy requested No - copy requested No - copy requested No - copy requested No - copy requested  No - copy requested    Current Medications (verified) Outpatient Encounter Medications  as of 03/13/2023  Medication Sig   acetaminophen  (TYLENOL ) 325 MG tablet Take 325 mg by mouth at bedtime.   amLODipine  (NORVASC ) 5 MG tablet Take 1 tablet (5 mg total) by mouth 2 (two) times daily.   aspirin  EC 81 MG tablet Take 1 tablet (81 mg total) by mouth daily.   donepezil  (ARICEPT ) 10 MG tablet TAKE 1/2 TABLET BY MOUTH AT BEDTIME   famotidine  (PEPCID ) 40 MG tablet famotidine  40 mg tablet TAKE 1 TABLET (40 MG TOTAL) BY MOUTH AT BEDTIME AS NEEDED FOR HEARTBURN OR INDIGESTION.   folic acid  (FOLVITE ) 1 MG tablet TAKE 1 TABLET BY MOUTH EVERY DAY   furosemide  (LASIX ) 20 MG tablet Take 0.5 tablets (10 mg total) by mouth daily as needed for edema.   hydrocortisone  (ANUSOL -HC) 2.5 % rectal cream Place 1 Application rectally 2 (two) times daily.   metoprolol  succinate (TOPROL -XL) 50 MG 24 hr tablet TAKE 1 TABLET BY MOUTH TWICE A DAY WITH OR IMMEDIATELY FOLLOWING A MEAL   Multiple Minerals-Vitamins (CITRACAL PLUS PO) Take 1 tablet by mouth 2 (two) times daily.   rosuvastatin  (CRESTOR ) 20 MG tablet TAKE 1 TABLET BY MOUTH EVERY DAY   trospium  (SANCTURA ) 20 MG tablet Take 1 tablet (20 mg total) by mouth daily.   UNABLE TO FIND COGNITIVE TABLET DAILY   valsartan  (DIOVAN ) 320 MG tablet TAKE 1 TABLET BY MOUTH EVERY DAY   Facility-Administered Encounter Medications as of 03/13/2023  Medication   cyanocobalamin  ((VITAMIN B-12)) injection 1,000 mcg    Allergies (  verified) Patient has no known allergies.   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   Cerebrovascular disease    Chest pain 01/19/2014   Chronic rhinitis 01/13/2009   Diverticulosis of colon    colonoscopy 04/14/1999   Dry mouth 03/13/2012   Fatigue 08/02/2014   GERD (gastroesophageal reflux disease)    Headache 02/11/2018   History of chicken pox    as a child    History of fibrocystic disease of breast    History of measles    as a child   History of mumps    as a child   Hyperlipidemia, mixed 02/20/2007   Goal LDL < 130 HBP/Pos fm hx   Hypertension    Hypokalemia 06/24/2012   Hyponatremia 08/02/2014   IBS (irritable bowel syndrome) 04/02/2011   IDA (iron deficiency anemia) 12/28/2020   Low vitamin D  level 01/09/2016   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   Obstructive sleep apnea 05/23/2017   uses CPAP nightly   Osteopenia after menopause    Overactive bladder 04/02/2011   Pedal edema 05/20/2020   Shoulder pain, bilateral 06/14/2011   Thoracic back pain 07/31/2017   Vitamin B 12 deficiency 10/16/2020   Past Surgical History:  Procedure Laterality Date   BREAST REDUCTION SURGERY Bilateral 1986   BUNIONECTOMY Bilateral 1980   CATARACT EXTRACTION, BILATERAL     COLONOSCOPY  2011   HAMMER TOE SURGERY Right 12/03/2013   foot   MOUTH SURGERY  01/10/2013   negative   TONSILLECTOMY     UPPER GASTROINTESTINAL ENDOSCOPY  2016   RK   Family History  Problem Relation Age of Onset   Stroke Mother 72   Hyperlipidemia Mother    Hypertension Mother    Heart attack Father 58   Hypertension Father    Heart disease Father    Hypertension Brother    Aneurysm Brother        aortic   Hyperlipidemia Brother    Heart disease Brother        aortic aneurysm rupture   Multiple sclerosis Daughter    Alcohol abuse Son    Schizophrenia Son    Bipolar disorder Son    Mental illness Son        schizophrenic, bipolar   Colon cancer Neg Hx    Colon polyps Neg Hx    Esophageal cancer Neg Hx    Rectal cancer Neg Hx    Stomach cancer Neg Hx    Alzheimer's disease Neg Hx    Social History   Socioeconomic History   Marital status: Married    Spouse name: Not on file   Number of children: 4   Years of education: 14   Highest education level: Associate  degree: academic program  Occupational History   Occupation: Retired    Comment: tax inspector  Tobacco Use   Smoking status: Never   Smokeless tobacco: Never  Vaping Use   Vaping status: Never Used  Substance and Sexual Activity   Alcohol use: No   Drug use: No   Sexual activity: Not Currently  Other Topics Concern   Not on file  Social History Narrative   4 children 2 are adopted      Lives at home with husband, children live closeby  Right handed   Caffeine: maybe 1-3 or more cups/day   Social Drivers of Corporate Investment Banker Strain: Low Risk  (03/13/2023)   Overall Financial Resource Strain (CARDIA)    Difficulty of Paying Living Expenses: Not hard at all  Food Insecurity: No Food Insecurity (03/13/2023)   Hunger Vital Sign    Worried About Running Out of Food in the Last Year: Never true    Ran Out of Food in the Last Year: Never true  Transportation Needs: No Transportation Needs (03/13/2023)   PRAPARE - Administrator, Civil Service (Medical): No    Lack of Transportation (Non-Medical): No  Physical Activity: Sufficiently Active (03/13/2023)   Exercise Vital Sign    Days of Exercise per Week: 5 days    Minutes of Exercise per Session: 30 min  Stress: No Stress Concern Present (03/13/2023)   [employer] of Occupational Health - Occupational Stress Questionnaire    Feeling of Stress : Not at all  Social Connections: Socially Integrated (03/13/2023)   Social Connection and Isolation Panel [NHANES]    Frequency of Communication with Friends and Family: More than three times a week    Frequency of Social Gatherings with Friends and Family: More than three times a week    Attends Religious Services: More than 4 times per year    Active Member of [employer] or Organizations: Yes    Attends Engineer, Structural: More than 4 times per year    Marital Status: Married    Tobacco Counseling Counseling given: Not Answered   Clinical  Intake:  Pre-visit preparation completed: Yes  Pain : No/denies pain     BMI - recorded: 21.3 Nutritional Status: BMI of 19-24  Normal Nutritional Risks: None Diabetes: No  How often do you need to have someone help you when you read instructions, pamphlets, or other written materials from your doctor or pharmacy?: 1 - Never  Interpreter Needed?: No  Information entered by :: Rojelio Blush LPN   Activities of Daily Living    03/13/2023   11:39 AM  In your present state of health, do you have any difficulty performing the following activities:  Hearing? 0  Vision? 0  Difficulty concentrating or making decisions? 0  Walking or climbing stairs? 0  Dressing or bathing? 0  Doing errands, shopping? 0  Preparing Food and eating ? N  Using the Toilet? N  In the past six months, have you accidently leaked urine? N  Do you have problems with loss of bowel control? N  Managing your Medications? N  Managing your Finances? N  Housekeeping or managing your Housekeeping? N    Patient Care Team: Domenica Harlene LABOR, MD as PCP - General (Family Medicine) Johnnye Ade, MD as Consulting Physician (Obstetrics and Gynecology) Cleotilde Sewer, OD as Consulting Physician (Optometry) Vernetta Lonni GRADE, MD as Consulting Physician (Orthopedic Surgery) Norleen Dimes, DDS as Consulting Physician (Dentistry)  Indicate any recent Medical Services you may have received from other than [facility] providers in the past year (date may be approximate).     Assessment:   This is a routine wellness examination for Erika [name].  Hearing/Vision screen Hearing Screening - Comments:: Denies hearing difficulties   Vision Screening - Comments:: Wears rx glasses - up to date with routine eye exams with  Dr Cleotilde   Goals Addressed               This Visit's Progress     Increase physical activity (  pt-stated)         Depression Screen    03/13/2023   11:37 AM 08/10/2022   10:23 AM 05/08/2022    9:49 AM  03/07/2022   10:25 AM 02/22/2022    9:45 AM 12/15/2021   10:14 AM 08/30/2021    4:30 PM  PHQ 2/9 Scores  PHQ - 2 Score 0 0 0 0 0 0 0  PHQ- 9 Score  0 5 0  3     Fall Risk    03/13/2023   11:40 AM 08/10/2022   10:23 AM 05/08/2022    9:48 AM 02/22/2022    9:42 AM 12/15/2021   10:13 AM  Fall Risk   Falls in the past year? 0 0 0 0 0  Number falls in past yr: 0 0 0 0 0  Injury with Fall? 0 0 0 0 0  Risk for fall due to : No Fall Risks   No Fall Risks   Follow up Falls prevention discussed Falls evaluation completed Falls evaluation completed Falls evaluation completed Falls evaluation completed    MEDICARE RISK AT HOME: Medicare Risk at Home Any stairs in or around the home?: Yes If so, are there any without handrails?: No Home free of loose throw rugs in walkways, pet beds, electrical cords, etc?: Yes Adequate lighting in your home to reduce risk of falls?: Yes Life alert?: No Use of a cane, walker or w/c?: Yes Grab bars in the bathroom?: Yes Shower chair or bench in shower?: Yes Elevated toilet seat or a handicapped toilet?: Yes  TIMED UP AND GO:  Was the test performed?  No    Cognitive Function:    12/26/2021    9:41 AM 06/27/2021   12:36 PM 09/30/2020   12:14 PM 10/30/2017    8:20 AM 08/07/2016    8:33 AM  MMSE - Mini Mental State Exam  Orientation to time 3 5 4 5 5   Orientation to Place 5 5 4 5 5   Registration 3 3 3 3 3   Attention/ Calculation 1 2 0 5 5  Recall 2 2 0 1 3  Language- name 2 objects 2 2 2 2 2   Language- repeat 1 1 1 1 1   Language- follow 3 step command 3 2 3 3 3   Language- read & follow direction 1 1 0 1 1  Write a sentence 1 1 1 1 1   Copy design 1 1 0 1 1  Total score 23 25 18 28 30         03/13/2023   11:40 AM 02/22/2022    9:51 AM  6CIT Screen  What Year? 0 points 0 points  What month? 0 points 0 points  What time? 0 points 0 points  Count back from 20 0 points 0 points  Months in reverse 4 points 2 points  Repeat phrase 8 points 8 points   Total Score 12 points 10 points    Immunizations Immunization History  Administered Date(s) Administered   Influenza Whole 12/05/2010, 12/04/2012   Influenza, High Dose Seasonal PF 01/01/2015, 11/16/2016, 12/28/2017, 11/05/2019, 12/12/2021   Influenza,inj,Quad PF,6+ Mos 01/12/2014   Influenza-Unspecified 12/30/2014   Moderna Sars-Covid-2 Vaccination 01/01/2020   PFIZER(Purple Top)SARS-COV-2 Vaccination 05/02/2019, 05/28/2019   Pfizer Covid-19 Vaccine Bivalent Booster 61yrs & up 02/08/2021   Pneumococcal Conjugate-13 01/13/2014   Pneumococcal Polysaccharide-23 07/22/2008, 12/05/2010   Td 03/06/2010   Zoster Recombinant(Shingrix) 06/19/2018, 10/20/2018   Zoster, Live 02/17/2014    TDAP status: Due, Education has been provided  regarding the importance of this vaccine. Advised may receive this vaccine at local pharmacy or Health Dept. Aware to provide a copy of the vaccination record if obtained from local pharmacy or Health Dept. Verbalized acceptance and understanding.  Flu Vaccine status: Due, Education has been provided regarding the importance of this vaccine. Advised may receive this vaccine at local pharmacy or Health Dept. Aware to provide a copy of the vaccination record if obtained from local pharmacy or Health Dept. Verbalized acceptance and understanding.  Pneumococcal vaccine status: Due, Education has been provided regarding the importance of this vaccine. Advised may receive this vaccine at local pharmacy or Health Dept. Aware to provide a copy of the vaccination record if obtained from local pharmacy or Health Dept. Verbalized acceptance and understanding.  Covid-19 vaccine status: Declined, Education has been provided regarding the importance of this vaccine but patient still declined. Advised may receive this vaccine at local pharmacy or Health Dept.or vaccine clinic. Aware to provide a copy of the vaccination record if obtained from local pharmacy or Health Dept. Verbalized  acceptance and understanding.  Qualifies for Shingles Vaccine? Yes   Zostavax completed Yes   Shingrix Completed?: Yes  Screening Tests Health Maintenance  Topic Date Due   Pneumonia Vaccine 34+ Years old (3 of 3 - PPSV23 or PCV20) 12/05/2015   DTaP/Tdap/Td (2 - Tdap) 03/06/2020   Colonoscopy  08/08/2022   INFLUENZA VACCINE  10/05/2022   COVID-19 Vaccine (5 - 2024-25 season) 11/05/2022   Medicare Annual Wellness (AWV)  03/12/2024   DEXA SCAN  Completed   Hepatitis C Screening  Completed   Zoster Vaccines- Shingrix  Completed   HPV VACCINES  Aged Out    Health Maintenance  Health Maintenance Due  Topic Date Due   Pneumonia Vaccine 40+ Years old (3 of 3 - PPSV23 or PCV20) 12/05/2015   DTaP/Tdap/Td (2 - Tdap) 03/06/2020   Colonoscopy  08/08/2022   INFLUENZA VACCINE  10/05/2022   COVID-19 Vaccine (5 - 2024-25 season) 11/05/2022    Colorectal cancer screening: Referral to GI placed Deferred. Pt aware the office will call re: appt.    Bone Density status: Completed 03/20/22. Results reflect: Bone density results: OSTEOPENIA. Repeat every   years.     Additional Screening:  Hepatitis C Screening: does qualify; Completed 05/07/15  Vision Screening: Recommended annual ophthalmology exams for early detection of glaucoma and other disorders of the eye. Is the patient up to date with their annual eye exam?  Yes  Who is the provider or what is the name of the office in which the patient attends annual eye exams? Dr Cleotilde If pt is not established with a provider, would they like to be referred to a provider to establish care? No .   Dental Screening: Recommended annual dental exams for proper oral hygiene   Community Resource Referral / Chronic Care Management:  CRR required this visit?  No   CCM required this visit?  No     Plan:     I have personally reviewed and noted the following in the patient's chart:   Medical and social history Use of alcohol, tobacco or  illicit drugs  Current medications and supplements including opioid prescriptions. Patient is not currently taking opioid prescriptions. Functional ability and status Nutritional status Physical activity Advanced directives List of other physicians Hospitalizations, surgeries, and ER visits in previous 12 months Vitals Screenings to include cognitive, depression, and falls Referrals and appointments  In addition, I have reviewed and discussed with  patient certain preventive protocols, quality metrics, and best practice recommendations. A written personalized care plan for preventive services as well as general preventive health recommendations were provided to patient.     Rojelio LELON Blush, LPN   10/05/7972   After Visit Summary: (MyChart) Due to this being a telephonic visit, the after visit summary with patients personalized plan was offered to patient via MyChart   Nurse Notes: None

## 2023-03-13 NOTE — Patient Instructions (Addendum)
 Ms. Erika [name] , Thank you for taking time to come for your Medicare Wellness Visit. I appreciate your ongoing commitment to your health goals. Please review the following plan we discussed and let me know if I can assist you in the future.   Referrals/Orders/Follow-Ups/Clinician Recommendations:   This is a list of the screening recommended for you and due dates:  Health Maintenance  Topic Date Due   Pneumonia Vaccine (3 of 3 - PPSV23 or PCV20) 12/05/2015   DTaP/Tdap/Td vaccine (2 - Tdap) 03/06/2020   Colon Cancer Screening  08/08/2022   Flu Shot  10/05/2022   COVID-19 Vaccine (5 - 2024-25 season) 11/05/2022   Medicare Annual Wellness Visit  03/12/2024   DEXA scan (bone density measurement)  Completed   Hepatitis C Screening  Completed   Zoster (Shingles) Vaccine  Completed   HPV Vaccine  Aged Out    Advanced directives: (Copy Requested) Please bring a copy of your health care power of attorney and living will to the office to be added to your chart at your convenience.  Next Medicare Annual Wellness Visit scheduled for next year: Yes

## 2023-03-13 NOTE — Progress Notes (Signed)
 Erika [name] is a 78 y.o. female presents to the office today for 2nd Monthly B12 injection, per physician's orders. Original order: Melissa Osullivan- 11/15/22: B12 level is low. Start b12 injections 1000mcg IM weekly x 4 then monthly.  Cyanocobalamin  1000 mg/ml IM was administered L Deltoid today. Patient tolerated injection. Patient due for follow up labs/provider appt: No.  Patient next injection due: 1 month for 3rd Monthly B12 injection, appt made YES   Creft, Candelaria L

## 2023-03-15 ENCOUNTER — Telehealth: Payer: Self-pay | Admitting: Family Medicine

## 2023-03-15 NOTE — Telephone Encounter (Unsigned)
 Copied from CRM [id number]. Topic: Referral - Question >> Mar 15, 2023 10:40 AM Rolin D wrote: Reason for CRM: Dawn from [facility] called stating she called patient to schedule an appointment regarding surgery on thyroid  . Dawn stated that the patient and her husband had no idea of the referral or surgery . I was informed by patient that she recently had a thyroid  biopsy done . Dawn is requesting provider to reach out to patient regarding the referral and if patient would like to schedule an appointment for the surgery to give Dawn a call at [phone]

## 2023-03-16 NOTE — Telephone Encounter (Signed)
 Called and spoke with [facility] from [facility].She said she was calling to set up a consultation with the patient, per the referral that was put in by Eleanor Ponto. There was some confusion on the patient's part. After speaking with Dawn myself, I let the patient's husband know they just wanted to talk with them. No surgical procedure was going to take place. He made the decision for his wife that they didn't want to talk with anyone. I called dawn back and made her aware.

## 2023-03-20 ENCOUNTER — Ambulatory Visit: Payer: Self-pay | Admitting: Family Medicine

## 2023-03-20 NOTE — Telephone Encounter (Signed)
 Pt's husband called in stating that pharmacy [facility] is having a hard time getting medication trospium  (SANCTURA ) 20 MG tablet in stock.they have been trying to get it for 2 weeks,patient would like to know if theres an alternative that can be prescribed?Husband stated that wife says that she doesn't actually need it anymore because she's not having incontinence anymore.He would ike to know is it okay for her to discontinue or could an alternative be sent in? Please advise.                Copied from CRM [id number]. Topic: Clinical - Prescription Issue >> Mar 20, 2023  2:34 PM Burnard DEL wrote: Reason for CRM: husband called in stating that [facility] is having a hard time getting medication trospium  (SANCTURA ) 20 MG tablet in stock.they have been trying to get it for 2 weeks,patient would like to know if theres an alternative that can be prescribed?Husband stated that wife says that she doesn't actually need it anymore because she's not having incontinence anymore.He would ike to know is it okay for her to discontinue or could an alternative be sent in? Answer Assessment - Initial Assessment Questions 1. NAME of MEDICINE: What medicine(s) are you calling about?     Sanctura  2. QUESTION: What is your question? (e.g., double dose of medicine, side effect)    Is there another option? 3. PRESCRIBER: Who prescribed the medicine? Reason: if prescribed by specialist, call should be referred to that group.     Domenica  Protocols used: Medication Question Call-A-AH

## 2023-03-21 ENCOUNTER — Other Ambulatory Visit: Payer: Self-pay | Admitting: Family Medicine

## 2023-03-21 NOTE — Telephone Encounter (Signed)
 Called and spoke with patient. She would like for me to call back around 4 pm when her husband is home.

## 2023-03-21 NOTE — Telephone Encounter (Signed)
 Called and spoke with Pharmacy Tech from [facility] pharmacy and they do not have it in stock either.

## 2023-03-22 NOTE — Telephone Encounter (Signed)
Called to speak with husband. No answer. LVM

## 2023-03-22 NOTE — Telephone Encounter (Signed)
Called and spoke with patient's husband. They were able to find the trospium at [facility] on [address].

## 2023-03-22 NOTE — Progress Notes (Signed)
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    Alzheimer's disease Neg Hx     Medications:   Current Outpatient Medications on File Prior to Visit  Medication Sig Dispense Refill   acetaminophen (TYLENOL) 325 MG tablet Take 325 mg by mouth at bedtime.     amLODipine (NORVASC) 5 MG tablet Take 1 tablet (5 mg total) by mouth 2 (two) times daily. 180 tablet 1   aspirin EC 81 MG tablet Take 1 tablet (81 mg total) by mouth daily.     donepezil (ARICEPT) 10 MG tablet TAKE 1/2 TABLET BY  MOUTH AT BEDTIME 45 tablet 1   folic acid (FOLVITE) 1 MG tablet TAKE 1 TABLET BY MOUTH EVERY DAY 90 tablet 2   furosemide (LASIX) 20 MG tablet Take 0.5 tablets (10 mg total) by mouth daily as needed for edema. 45 tablet 1   hydrocortisone (ANUSOL-HC) 2.5 % rectal cream Place 1 Application rectally 2 (two) times daily. 30 g 0   metoprolol succinate (TOPROL-XL) 50 MG 24 hr tablet TAKE 1 TABLET BY MOUTH TWICE A DAY WITH OR IMMEDIATELY FOLLOWING A MEAL 180 tablet 1   Multiple Minerals-Vitamins (CITRACAL PLUS PO) Take 1 tablet by mouth 2 (two) times daily.     rosuvastatin (CRESTOR) 20 MG tablet TAKE 1  TABLET BY MOUTH EVERY DAY 90 tablet 1   trospium (SANCTURA) 20 MG tablet Take 1 tablet (20 mg total) by mouth daily. 90 tablet 1   UNABLE TO FIND COGNITIVE TABLET DAILY     valsartan (DIOVAN) 320 MG tablet TAKE 1 TABLET BY MOUTH EVERY DAY 90 tablet 1   Current Facility-Administered Medications on File Prior to Visit  Medication Dose Route Frequency Provider Last Rate Last Admin   cyanocobalamin ((VITAMIN B-12)) injection 1,000 mcg  1,000 mcg Intramuscular Q30 days Zola Button, Myrene Buddy R, DO   1,000 mcg at 03/13/23 1019    Allergies:  No Known Allergies    OBJECTIVE:  Physical Exam  Vitals:   03/26/23 1026 03/26/23 1036  BP: (!) 170/58 (!) 160/67  Pulse: (!) 48 (!) 47  Weight: 135 lb (61.2 kg)   Height: 5\' 6"  (1.676 m)    Body mass index is 21.79 kg/m. No results found.  General: well developed, well nourished, pleasant elderly Caucasian female, seated, in no evident distress Head: head normocephalic and atraumatic.   Neck: supple with no carotid or supraclavicular bruits Cardiovascular: regular rate and rhythm, no murmurs Musculoskeletal: no deformity Skin:  no rash/petichiae Vascular:  Normal pulses all extremities   Neurologic Exam Mental Status: Awake and fully alert.  Occasional word finding difficulty but otherwise fluent speech.  No evidence of dysarthria.  Recent memory impaired and remote memory intact. Attention span, concentration and fund of knowledge appropriate during visit. Mood and affect appropriate.     03/26/2023   10:30 AM 12/26/2021    9:41 AM 06/27/2021   12:36 PM  MMSE - Mini Mental State Exam  Orientation to time 4 3 5   Orientation to Place 3 5 5   Registration 3 3 3   Attention/ Calculation 0 1 2  Recall 2 2 2   Language- name 2 objects 2 2 2   Language- repeat 1 1 1   Language- follow 3 step command 3 3 2   Language- read & follow direction 1 1 1   Write a sentence 1 1 1   Copy design 1 1 1   Total score 21 23 25    Cranial Nerves: Pupils equal,  briskly reactive to light. Extraocular movements full without nystagmus. Visual fields full to confrontation. Hearing intact. Facial sensation intact. Face, tongue, palate moves normally and symmetrically.  Motor: Normal bulk and tone. Normal strength in all tested extremity muscles Sensory.: intact to touch , pinprick , position and vibratory sensation.  Coordination: Rapid alternating movements normal in all extremities. Finger-to-nose and heel-to-shin performed accurately bilaterally. Gait and Station: Arises from chair without difficulty. Stance is normal. Gait demonstrates normal stride length and balance without use of AD.  Reflexes: 1+ and symmetric. Toes downgoing.         ASSESSMENT: Erika [name] is a  78 y.o. year old female with ongoing memory loss concerns associated with aphasia. 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  Was seen by [facility] memory clinic but wishes for continued follow-ups in this office.     PLAN:  Memory loss:  MMSE 21/30 (prior 23/30) Subjectively stable  Would recommend formal driving evaluation as recommended by neuropsychology prior to return to driving, additional information provided regarding different rehab locations and contact information Continue donepezil 10 mg nightly Continue routine memory exercises such as crossword puzzles, word search, sudoku, jigsaw puzzles, card games, etc, also discussed the importance of routine exercise and maintaining a healthy diet  Sleep concerns: Longstanding history of sleep difficulty.  Discussed trial of melatonin but also encouraged follow-up with PCP and/or pulmonology who manages OSA on CPAP for further recommendations.    Follow-up in 6 months or call earlier if needed    CC:  PCP: Bradd Canary, MD    I spent 30 minutes of face-to-face and non-face-to-face time  with patient and husband.  This included previsit chart review, lab review, study review, order entry, electronic health record documentation, patient and husband education and discussion regarding above diagnoses and treatment plan and answered all other questions to patient and husband's satisfaction  Ihor Austin, [facility]  [facility] [address] [place], [place] [address]  Phone [phone] Fax [phone] Note: This document was prepared with digital dictation and possible smart phrase technology. Any transcriptional errors that result from this process are unintentional.

## 2023-03-26 ENCOUNTER — Encounter: Payer: Self-pay | Admitting: Adult Health

## 2023-03-26 ENCOUNTER — Ambulatory Visit: Payer: PPO | Admitting: Adult Health

## 2023-03-26 VITALS — BP 160/67 | HR 47 | Ht 66.0 in | Wt 135.0 lb

## 2023-03-26 DIAGNOSIS — G479 Sleep disorder, unspecified: Secondary | ICD-10-CM | POA: Diagnosis not present

## 2023-03-26 DIAGNOSIS — F0390 Unspecified dementia without behavioral disturbance: Secondary | ICD-10-CM

## 2023-03-26 NOTE — Patient Instructions (Addendum)
Your Plan:  Recommend starting melatonin 2mg  nightly - can gradually increase as needed up to 10mg  nightly  If you continue to struggle with insomnia, would recommend follow up with your PCP  Continue Aricept 10mg  nightly for cognition   Continue to stay active as tolerated and ensure you are maintaining a healthy diet      Follow up in 6 months or call earlier if needed      Local Driver Evaluation Programs:   Comprehensive Evaluation: includes clinical and in vehicle behind the wheel testing by OCCUPATIONAL THERAPIST. Programs have varying levels of adaptive controls available for trial.   [employer], [place] [address] [place], [place]  [address] [phone] or [phone] [email or web address] Evaluator:  Elvera Lennox, OT/CDRS/CDI/SCDCM/Low Vision Certification   [facility] [address] [place], [place] [address] [phone] [email or web address] Evaluators:  Rockwell Alexandria, OT and Yves Dill, OT   [facility] (ONLY SERVES VETERANS!!) [facility] [address] [place], [place]  [address] [phone] [email or web address] Evaluators:  Randall Hiss, KT; Rayburn Felt, KT;  Sheran Luz, KT (KT=kiniesotherapist)           Thank you for coming to see [place] at [facility]. I hope we have been able to provide you high quality care today.  You may receive a patient satisfaction survey over the next few weeks. We would appreciate your feedback and comments so that we may continue to improve ourselves and the health of our patients.

## 2023-04-06 ENCOUNTER — Other Ambulatory Visit (HOSPITAL_BASED_OUTPATIENT_CLINIC_OR_DEPARTMENT_OTHER): Payer: Self-pay | Admitting: Family Medicine

## 2023-04-06 DIAGNOSIS — Z139 Encounter for screening, unspecified: Secondary | ICD-10-CM

## 2023-04-10 ENCOUNTER — Encounter (HOSPITAL_BASED_OUTPATIENT_CLINIC_OR_DEPARTMENT_OTHER): Payer: Self-pay

## 2023-04-10 ENCOUNTER — Ambulatory Visit (HOSPITAL_BASED_OUTPATIENT_CLINIC_OR_DEPARTMENT_OTHER)
Admission: RE | Admit: 2023-04-10 | Discharge: 2023-04-10 | Disposition: A | Discharge status: Home / Self Care | Payer: PPO | Source: Ambulatory Visit | Attending: Family Medicine | Admitting: Family Medicine

## 2023-04-10 DIAGNOSIS — Z1231 Encounter for screening mammogram for malignant neoplasm of breast: Secondary | ICD-10-CM | POA: Insufficient documentation

## 2023-04-10 DIAGNOSIS — R928 Other abnormal and inconclusive findings on diagnostic imaging of breast: Secondary | ICD-10-CM | POA: Insufficient documentation

## 2023-04-10 DIAGNOSIS — Z139 Encounter for screening, unspecified: Secondary | ICD-10-CM

## 2023-04-13 ENCOUNTER — Other Ambulatory Visit: Payer: Self-pay | Admitting: Family Medicine

## 2023-04-13 DIAGNOSIS — R928 Other abnormal and inconclusive findings on diagnostic imaging of breast: Secondary | ICD-10-CM

## 2023-04-15 ENCOUNTER — Other Ambulatory Visit: Payer: Self-pay | Admitting: Family Medicine

## 2023-04-17 ENCOUNTER — Ambulatory Visit (INDEPENDENT_AMBULATORY_CARE_PROVIDER_SITE_OTHER): Payer: PPO | Admitting: *Deleted

## 2023-04-17 DIAGNOSIS — E538 Deficiency of other specified B group vitamins: Secondary | ICD-10-CM | POA: Diagnosis not present

## 2023-04-17 MED ORDER — CYANOCOBALAMIN 1000 MCG/ML IJ SOLN
1000.0000 ug | Freq: Once | INTRAMUSCULAR | Status: AC
Start: 2023-04-17 — End: 2023-04-17
  Administered 2023-04-17: 1000 ug via INTRAMUSCULAR

## 2023-04-17 NOTE — Progress Notes (Signed)
Erika [name] is a 78 y.o. female presents to the office today for 2nd Monthly B12 injection, per physician's orders. Original order: Macario Carls- 11/15/22: "B12 level is low. Start b12 injections IM weekly x 4 then monthly. " Cyanocobalamin 1000 mg/ml IM was administered R Deltoid today. Patient tolerated injection. Patient due for follow up labs/provider appt: No.  Patient next injection due: 1 month for 4th Monthly B12 injection, appt made YES

## 2023-04-26 ENCOUNTER — Ambulatory Visit: Payer: PPO

## 2023-04-26 ENCOUNTER — Ambulatory Visit
Admission: RE | Admit: 2023-04-26 | Discharge: 2023-04-26 | Disposition: A | Discharge status: Home / Self Care | Payer: PPO | Source: Ambulatory Visit | Attending: Family Medicine | Admitting: Family Medicine

## 2023-04-26 DIAGNOSIS — R928 Other abnormal and inconclusive findings on diagnostic imaging of breast: Secondary | ICD-10-CM | POA: Diagnosis not present

## 2023-05-16 ENCOUNTER — Ambulatory Visit (INDEPENDENT_AMBULATORY_CARE_PROVIDER_SITE_OTHER): Payer: PPO

## 2023-05-16 DIAGNOSIS — E538 Deficiency of other specified B group vitamins: Secondary | ICD-10-CM | POA: Diagnosis not present

## 2023-05-16 NOTE — Progress Notes (Signed)
 Pt here for monthly B12 injection per [facility]  B12 given IM, and pt tolerated injection well.  Pt states she will call to scheduled next appt

## 2023-05-23 ENCOUNTER — Encounter (HOSPITAL_BASED_OUTPATIENT_CLINIC_OR_DEPARTMENT_OTHER): Payer: Self-pay

## 2023-05-26 ENCOUNTER — Other Ambulatory Visit: Payer: Self-pay | Admitting: Family Medicine

## 2023-05-28 ENCOUNTER — Inpatient Hospital Stay: Payer: PPO | Attending: Hematology & Oncology

## 2023-05-28 DIAGNOSIS — E538 Deficiency of other specified B group vitamins: Secondary | ICD-10-CM | POA: Insufficient documentation

## 2023-05-28 DIAGNOSIS — D509 Iron deficiency anemia, unspecified: Secondary | ICD-10-CM | POA: Insufficient documentation

## 2023-05-28 DIAGNOSIS — R7983 Abnormal findings of blood amino-acid level: Secondary | ICD-10-CM

## 2023-05-28 LAB — IRON AND IRON BINDING CAPACITY (CC-WL,HP ONLY)
Iron: 115 ug/dL (ref 28–170)
Saturation Ratios: 38 % — ABNORMAL HIGH (ref 10.4–31.8)
TIBC: 302 ug/dL (ref 250–450)
UIBC: 187 ug/dL (ref 148–442)

## 2023-05-28 LAB — CBC
HCT: 38.4 % (ref 36.0–46.0)
Hemoglobin: 13.5 g/dL (ref 12.0–15.0)
MCH: 33.4 pg (ref 26.0–34.0)
MCHC: 35.2 g/dL (ref 30.0–36.0)
MCV: 95 fL (ref 80.0–100.0)
Platelets: 191 10*3/uL (ref 150–400)
RBC: 4.04 MIL/uL (ref 3.87–5.11)
RDW: 11.2 % — ABNORMAL LOW (ref 11.5–15.5)
WBC: 5.3 10*3/uL (ref 4.0–10.5)
nRBC: 0 % (ref 0.0–0.2)

## 2023-05-28 LAB — RETIC PANEL
Immature Retic Fract: 6 % (ref 2.3–15.9)
RBC.: 4.03 MIL/uL (ref 3.87–5.11)
Retic Count, Absolute: 56 10*3/uL (ref 19.0–186.0)
Retic Ct Pct: 1.4 % (ref 0.4–3.1)
Reticulocyte Hemoglobin: 37.1 pg (ref >27.9)

## 2023-05-28 LAB — FERRITIN: Ferritin: 187 ng/mL (ref 11–307)

## 2023-05-28 LAB — BASIC METABOLIC PANEL - CANCER CENTER ONLY
Anion gap: 7 (ref 5–15)
BUN: 15 mg/dL (ref 8–23)
CO2: 30 mmol/L (ref 22–32)
Calcium: 9 mg/dL (ref 8.9–10.3)
Chloride: 106 mmol/L (ref 98–111)
Creatinine: 0.91 mg/dL (ref 0.44–1.00)
GFR, Estimated: >60 mL/min (ref >60)
Glucose, Bld: 91 mg/dL (ref 70–99)
Potassium: 3.6 mmol/L (ref 3.5–5.1)
Sodium: 143 mmol/L (ref 135–145)

## 2023-05-29 LAB — HOMOCYSTEINE: Homocysteine: 7.7 umol/L (ref 0.0–19.2)

## 2023-05-30 ENCOUNTER — Encounter: Payer: Self-pay | Admitting: Medical Oncology

## 2023-06-06 ENCOUNTER — Ambulatory Visit (HOSPITAL_BASED_OUTPATIENT_CLINIC_OR_DEPARTMENT_OTHER): Payer: PPO | Admitting: Pulmonary Disease

## 2023-06-10 NOTE — Assessment & Plan Note (Signed)
 Supplement and monitor

## 2023-06-10 NOTE — Assessment & Plan Note (Signed)
 Following with neurology, doing well at home.

## 2023-06-10 NOTE — Assessment & Plan Note (Signed)
 Encourage heart healthy diet such as MIND or DASH diet, increase exercise, avoid trans fats, simple carbohydrates and processed foods, consider a krill or fish or flaxseed oil cap daily.

## 2023-06-10 NOTE — Assessment & Plan Note (Signed)
 Encouraged to get adequate exercise, calcium and vitamin d intake

## 2023-06-10 NOTE — Assessment & Plan Note (Signed)
 hgba1c acceptable, minimize simple carbs. Increase exercise as tolerated.

## 2023-06-11 ENCOUNTER — Ambulatory Visit (INDEPENDENT_AMBULATORY_CARE_PROVIDER_SITE_OTHER): Payer: PPO | Admitting: Family Medicine

## 2023-06-11 ENCOUNTER — Encounter: Payer: Self-pay | Admitting: Family Medicine

## 2023-06-11 VITALS — BP 158/78 | HR 49 | Temp 98.0°F | Resp 18 | Ht 65.0 in | Wt 134.8 lb

## 2023-06-11 DIAGNOSIS — E782 Mixed hyperlipidemia: Secondary | ICD-10-CM

## 2023-06-11 DIAGNOSIS — E559 Vitamin D deficiency, unspecified: Secondary | ICD-10-CM

## 2023-06-11 DIAGNOSIS — Z780 Asymptomatic menopausal state: Secondary | ICD-10-CM | POA: Diagnosis not present

## 2023-06-11 DIAGNOSIS — E538 Deficiency of other specified B group vitamins: Secondary | ICD-10-CM | POA: Diagnosis not present

## 2023-06-11 DIAGNOSIS — G3109 Other frontotemporal dementia: Secondary | ICD-10-CM

## 2023-06-11 DIAGNOSIS — F02A0 Dementia in other diseases classified elsewhere, mild, without behavioral disturbance, psychotic disturbance, mood disturbance, and anxiety: Secondary | ICD-10-CM

## 2023-06-11 DIAGNOSIS — M8580 Other specified disorders of bone density and structure, unspecified site: Secondary | ICD-10-CM | POA: Diagnosis not present

## 2023-06-11 DIAGNOSIS — R739 Hyperglycemia, unspecified: Secondary | ICD-10-CM

## 2023-06-11 NOTE — Patient Instructions (Signed)
 Ask the front desk for a phone number to call to correct your mychart concerns.

## 2023-06-11 NOTE — Progress Notes (Signed)
 Subjective:    Patient ID: Erika [name], female    DOB: 02-19-1946, 78 y.o.   MRN: [id number]  Chief Complaint  Patient presents with   Follow-up    HPI Discussed the use of AI scribe software for clinical note transcription with the patient, who gave verbal consent to proceed.  History of Present Illness The patient presents with memory loss and anxiety for follow-up. She is accompanied by her husband.  She experiences ongoing memory loss and anxiety, which have been persistent issues. The memory issues make it harder to track daily activities and increase anxiety, especially with the care responsibilities she has at home.  She is currently taking donepezil (Aricept) for memory support but has not tried [employer]. Additionally, she uses over-the-counter supplements like Privigen, although she is unsure of their efficacy.  She is currently taking multiple medications for blood pressure management, including amlodipine, Diovan, and metoprolol. She monitors her blood pressure at home and is aware of the effects these medications have on her pulse and blood pressure.  Her sleep has improved, which she notes as a positive change. However, she experiences grogginess and fatigue, which she attributes to stress and the demands of her daily life.  She is involved in caring for her family, including her daughter who lives with her and a grandson. Her daughter travels frequently to [place], where her husband works. She also mentions a 'sick kid' who is the father of her grandson and suffers from migraines, which adds to her stress.    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   Cerebrovascular disease    Chest pain 01/19/2014   Chronic rhinitis 01/13/2009   Diverticulosis of colon    colonoscopy 04/14/1999   Dry mouth  03/13/2012   Fatigue 08/02/2014   GERD (gastroesophageal reflux disease)    Headache 02/11/2018   History of chicken pox    as a child   History of fibrocystic disease of breast    History of measles    as a child   History of mumps    as a child   Hyperlipidemia, mixed 02/20/2007   Goal LDL < 130 HBP/Pos fm hx   Hypertension    Hypokalemia 06/24/2012   Hyponatremia 08/02/2014   IBS (irritable bowel syndrome) 04/02/2011   IDA (iron deficiency anemia) 12/28/2020   Low vitamin D level 01/09/2016   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   Obstructive sleep apnea 05/23/2017   uses CPAP nightly   Osteopenia after menopause    Overactive bladder 04/02/2011   Pedal edema 05/20/2020   Shoulder pain, bilateral 06/14/2011   Thoracic back pain 07/31/2017   Vitamin B 12 deficiency 10/16/2020    Past Surgical History:  Procedure Laterality Date   BREAST REDUCTION SURGERY Bilateral 1986   BUNIONECTOMY Bilateral 1980   CATARACT EXTRACTION, BILATERAL     COLONOSCOPY  2011   HAMMER TOE SURGERY Right 12/03/2013   foot   MOUTH SURGERY  01/10/2013   negative   TONSILLECTOMY     UPPER GASTROINTESTINAL ENDOSCOPY  2016   RK    Family History  Problem Relation Age of Onset   Stroke Mother 32   Hyperlipidemia Mother    Hypertension Mother    Heart attack Father 21   Hypertension Father  Heart disease Father    Hypertension Brother    Aneurysm Brother        aortic   Hyperlipidemia Brother    Heart disease Brother        aortic aneurysm rupture   Multiple sclerosis Daughter    Alcohol abuse Son    Schizophrenia Son    Bipolar disorder Son    Mental illness Son        schizophrenic, bipolar   Colon cancer Neg Hx    Colon polyps Neg Hx    Esophageal cancer Neg Hx    Rectal cancer Neg Hx    Stomach cancer Neg Hx    Alzheimer's disease Neg Hx     Social History   Socioeconomic  History   Marital status: Married    Spouse name: Not on file   Number of children: 4   Years of education: 14   Highest education level: Associate degree: academic program  Occupational History   Occupation: Retired    Comment: Tax inspector  Tobacco Use   Smoking status: Never   Smokeless tobacco: Never  Vaping Use   Vaping status: Never Used  Substance and Sexual Activity   Alcohol use: No   Drug use: No   Sexual activity: Not Currently  Other Topics Concern   Not on file  Social History Narrative   4 children 2 are adopted      Lives at home with husband, children live closeby    Right handed   Caffeine: maybe 1-3 "or more" cups/day   Social Drivers of Corporate investment banker Strain: Low Risk  (03/13/2023)   Overall Financial Resource Strain (CARDIA)    Difficulty of Paying Living Expenses: Not hard at all  Food Insecurity: No Food Insecurity (03/13/2023)   Hunger Vital Sign    Worried About Running Out of Food in the Last Year: Never true    Ran Out of Food in the Last Year: Never true  Transportation Needs: No Transportation Needs (03/13/2023)   PRAPARE - Administrator, Civil Service (Medical): No    Lack of Transportation (Non-Medical): No  Physical Activity: Sufficiently Active (03/13/2023)   Exercise Vital Sign    Days of Exercise per Week: 5 days    Minutes of Exercise per Session: 30 min  Stress: No Stress Concern Present (03/13/2023)   [employer] of Occupational Health - Occupational Stress Questionnaire    Feeling of Stress : Not at all  Social Connections: Socially Integrated (03/13/2023)   Social Connection and Isolation Panel [NHANES]    Frequency of Communication with Friends and Family: More than three times a week    Frequency of Social Gatherings with Friends and Family: More than three times a week    Attends Religious Services: More than 4 times per year    Active Member of [employer] or Organizations: Yes    Attends Museum/gallery exhibitions officer: More than 4 times per year    Marital Status: Married  Catering manager Violence: Not At Risk (03/13/2023)   Humiliation, Afraid, Rape, and Kick questionnaire    Fear of Current or Ex-Partner: No    Emotionally Abused: No    Physically Abused: No    Sexually Abused: No    Outpatient Medications Prior to Visit  Medication Sig Dispense Refill   acetaminophen (TYLENOL) 325 MG tablet Take 325 mg by mouth at bedtime.     amLODipine (NORVASC) 5 MG tablet Take 1 tablet (5  mg total) by mouth 2 (two) times daily. 180 tablet 1   aspirin EC 81 MG tablet Take 1 tablet (81 mg total) by mouth daily.     donepezil (ARICEPT) 10 MG tablet TAKE 1/2 TABLET BY MOUTH AT BEDTIME 45 tablet 1   folic acid (FOLVITE) 1 MG tablet TAKE 1 TABLET BY MOUTH EVERY DAY 90 tablet 2   furosemide (LASIX) 20 MG tablet Take 0.5 tablets (10 mg total) by mouth daily as needed for edema. 45 tablet 1   hydrocortisone (ANUSOL-HC) 2.5 % rectal cream Place 1 Application rectally 2 (two) times daily. 30 g 0   metoprolol succinate (TOPROL-XL) 50 MG 24 hr tablet TAKE 1 TABLET BY MOUTH TWICE A DAY WITH OR IMMEDIATELY FOLLOWING A MEAL 180 tablet 1   Multiple Minerals-Vitamins (CITRACAL PLUS PO) Take 1 tablet by mouth 2 (two) times daily.     rosuvastatin (CRESTOR) 20 MG tablet TAKE 1 TABLET BY MOUTH EVERY DAY 90 tablet 1   trospium (SANCTURA) 20 MG tablet Take 1 tablet (20 mg total) by mouth daily. 90 tablet 1   UNABLE TO FIND COGNITIVE TABLET DAILY     valsartan (DIOVAN) 320 MG tablet TAKE 1 TABLET BY MOUTH EVERY DAY 90 tablet 1   Facility-Administered Medications Prior to Visit  Medication Dose Route Frequency Provider Last Rate Last Admin   cyanocobalamin ((VITAMIN B-12)) injection 1,000 mcg  1,000 mcg Intramuscular Q30 days Zola Button, Myrene Buddy R, DO   1,000 mcg at 05/16/23 1126    No Known Allergies  Review of Systems  Constitutional:  Positive for malaise/fatigue. Negative for fever.  HENT:  Negative for  congestion.   Eyes:  Negative for blurred vision.  Respiratory:  Negative for shortness of breath.   Cardiovascular:  Negative for chest pain, palpitations and leg swelling.  Gastrointestinal:  Negative for abdominal pain, blood in stool and nausea.  Genitourinary:  Negative for dysuria and frequency.  Musculoskeletal:  Negative for falls.  Skin:  Negative for rash.  Neurological:  Negative for dizziness, loss of consciousness and headaches.  Endo/Heme/Allergies:  Negative for environmental allergies.  Psychiatric/Behavioral:  Positive for memory loss. Negative for depression. The patient is nervous/anxious.        Objective:    Physical Exam  BP (!) 170/64 (BP Location: Left Arm, Patient Position: Sitting, Cuff Size: Normal)   Pulse (!) 49   Temp 98 F (36.7 C) (Oral)   Resp 18   Ht 5\' 5"  (1.651 m)   Wt 134 lb 12.8 oz (61.1 kg)   SpO2 99%   BMI 22.43 kg/m  Wt Readings from Last 3 Encounters:  06/11/23 134 lb 12.8 oz (61.1 kg)  03/26/23 135 lb (61.2 kg)  03/13/23 136 lb (61.7 kg)    Diabetic Foot Exam - Simple   No data filed    Lab Results  Component Value Date   WBC 5.3 05/28/2023   HGB 13.5 05/28/2023   HCT 38.4 05/28/2023   PLT 191 05/28/2023   GLUCOSE 91 05/28/2023   CHOL 147 08/10/2022   TRIG 125.0 08/10/2022   HDL 44.60 08/10/2022   LDLDIRECT 163.0 07/24/2014   LDLCALC 78 08/10/2022   ALT 13 03/08/2023   AST 18 03/08/2023   NA 143 05/28/2023   K 3.6 05/28/2023   CL 106 05/28/2023   CREATININE 0.91 05/28/2023   BUN 15 05/28/2023   CO2 30 05/28/2023   TSH 0.60 01/09/2023   HGBA1C 5.2 08/10/2022    Lab Results  Component  Value Date   TSH 0.60 01/09/2023   Lab Results  Component Value Date   WBC 5.3 05/28/2023   HGB 13.5 05/28/2023   HCT 38.4 05/28/2023   MCV 95.0 05/28/2023   PLT 191 05/28/2023   Lab Results  Component Value Date   NA 143 05/28/2023   K 3.6 05/28/2023   CO2 30 05/28/2023   GLUCOSE 91 05/28/2023   BUN 15 05/28/2023    CREATININE 0.91 05/28/2023   BILITOT 1.0 03/08/2023   ALKPHOS 74 03/08/2023   AST 18 03/08/2023   ALT 13 03/08/2023   PROT 7.0 03/08/2023   ALBUMIN 4.6 03/08/2023   CALCIUM 9.0 05/28/2023   ANIONGAP 7 05/28/2023   GFR 60.21 03/08/2023   Lab Results  Component Value Date   CHOL 147 08/10/2022   Lab Results  Component Value Date   HDL 44.60 08/10/2022   Lab Results  Component Value Date   LDLCALC 78 08/10/2022   Lab Results  Component Value Date   TRIG 125.0 08/10/2022   Lab Results  Component Value Date   CHOLHDL 3 08/10/2022   Lab Results  Component Value Date   HGBA1C 5.2 08/10/2022       Assessment & Plan:  B12 deficiency Assessment & Plan: Supplement and monitor    Hyperglycemia Assessment & Plan: hgba1c acceptable, minimize simple carbs. Increase exercise as tolerated.   Hyperlipidemia, mixed Assessment & Plan: Encourage heart healthy diet such as MIND or DASH diet, increase exercise, avoid trans fats, simple carbohydrates and processed foods, consider a krill or fish or flaxseed oil cap daily.    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   Vitamin D deficiency Assessment & Plan: Supplement and monitor    Osteopenia after menopause Assessment & Plan: Encouraged to get adequate exercise, calcium and vitamin d intake      Assessment and Plan Assessment & Plan Hypertension Blood pressure management under review. Amlodipine, Diovan, and metoprolol prescribed. Home monitoring essential to prevent over-treatment and fatigue. - Monitor blood pressure and pulse at home twice a week. - Report blood pressure and pulse readings via MyChart or other means. - Evaluate home blood pressure readings before making medication adjustments.  Memory Loss Memory loss managed with donepezil. Discussed Namenda as an alternative. Emphasized neurology  follow-up for memory and anxiety management. - Continue donepezil (Aricept). - Consider Namenda as an alternative treatment. - Continue follow-up with neurology.  General Health Maintenance Recent blood work normal. Deferred further testing to avoid unnecessary procedures. Emphasized stress management and naps for heart health. - Defer blood work until next visit. - Encourage stress management techniques, including taking naps.     Danise Edge, MD

## 2023-06-16 ENCOUNTER — Other Ambulatory Visit: Payer: Self-pay | Admitting: Family Medicine

## 2023-06-20 ENCOUNTER — Ambulatory Visit (HOSPITAL_BASED_OUTPATIENT_CLINIC_OR_DEPARTMENT_OTHER): Admitting: Primary Care

## 2023-06-20 VITALS — BP 128/80 | HR 83 | Ht 65.0 in | Wt 135.0 lb

## 2023-06-20 DIAGNOSIS — G4733 Obstructive sleep apnea (adult) (pediatric): Secondary | ICD-10-CM

## 2023-06-20 NOTE — Progress Notes (Signed)
 @Patient  ID: Erika [name], female    DOB: 1945-10-26, 78 y.o.   MRN: [id number]  Chief Complaint  Patient presents with   Follow-up    Obstructive sleep apnea (CPAP compliance)    Referring provider: Neda Balk, MD  HPI: [id number]  yo for FU of mild obstructive sleep apnea   PMH -refractory hypertension, Alzheimer's   06/20/2023- Interim hx  Discussed the use of AI scribe software for clinical note transcription with the patient, who gave verbal consent to proceed.  History of Present Illness   Erika [name] is a 78 year old female with mild sleep apnea who presents for an annual CPAP compliance check.  She has been using a CPAP machine for the past five years following a home sleep study in March 2020 that showed mild sleep apnea with an apnea-hypopnea index (AHI) of 11 events per hour. Her current CPAP settings are auto-adjusting between 10 and 15 cm H2O. Recent data shows she uses the machine 97% of the time, averaging 7 hours and 53 minutes per night. Her AHI is now reduced to 0.6 events per hour, indicating well-controlled sleep apnea.  She needs to clean and replace parts of her CPAP machine, including the hose and nasal pillow. She experiences occasional air leaks, which require repositioning of the nasal pillow during the night. She has a surplus of supplies at home but does not change them regularly.  She experiences bad dreams approximately once a month, which can be distressing, causing her to wake up screaming or kicking. These episodes do not significantly interfere with her sleep. She is currently on Aricept , which she started in November, for memory changes. She has an upcoming neurology appointment to further evaluate these symptoms.      Airview download 05/20/23-06/18/23 Usage days 29/30 days (97%) Average usage 7 hours 53 mins Pressure 10-15cm h20 Airleaks 11.1L/min (95%) AHI 0.6  Significant tests/ events reviewed   HST 3/ 2020 showed mild sleep apnea with  AHI 11/hour  No Known Allergies  Immunization History  Administered Date(s) Administered   Influenza Whole 12/05/2010, 12/04/2012   Influenza, High Dose Seasonal PF 01/01/2015, 11/16/2016, 12/28/2017, 11/05/2019, 12/12/2021   Influenza,inj,Quad PF,6+ Mos 01/12/2014   Influenza-Unspecified 12/30/2014   Moderna Sars-Covid-2 Vaccination 01/01/2020   PFIZER(Purple Top)SARS-COV-2 Vaccination 05/02/2019, 05/28/2019   Pfizer Covid-19 Vaccine Bivalent Booster 78yrs & up 02/08/2021   Pneumococcal Conjugate-13 01/13/2014   Pneumococcal Polysaccharide-23 07/22/2008, 12/05/2010   Td 03/06/2010   Zoster Recombinant(Shingrix) 06/19/2018, 10/20/2018   Zoster, Live 02/17/2014    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   Cerebrovascular disease    Chest pain 01/19/2014   Chronic rhinitis 01/13/2009   Diverticulosis of colon    colonoscopy 04/14/1999   Dry mouth 03/13/2012   Fatigue 08/02/2014   GERD (gastroesophageal reflux disease)    Headache 02/11/2018   History of chicken pox    as a child   History of fibrocystic disease of breast    History of measles    as a child   History of mumps    as a child   Hyperlipidemia, mixed 02/20/2007   Goal LDL < 130 HBP/Pos fm hx   Hypertension    Hypokalemia 06/24/2012   Hyponatremia 08/02/2014   IBS (irritable bowel syndrome) 04/02/2011   IDA (iron deficiency anemia) 12/28/2020   Low  vitamin D  level 01/09/2016   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     amLODipine  (NORVASC ) 5 MG tablet Take 1 tablet (5 mg total) by mouth 2 (two) times daily. 180 tablet 1   aspirin  EC 81 MG tablet Take 1 tablet (81 mg total) by mouth daily.     donepezil  (ARICEPT ) 10 MG tablet TAKE 1/2 TABLET BY MOUTH AT BEDTIME 45 tablet 1   folic acid  (FOLVITE ) 1 MG tablet TAKE 1 TABLET BY MOUTH EVERY DAY 90 tablet 2   furosemide  (LASIX ) 20 MG tablet Take 0.5 tablets (10 mg total) by mouth daily as needed for edema. 45 tablet 1   hydrocortisone  (ANUSOL -HC) 2.5 % rectal cream Place 1 Application rectally 2 (two) times daily. 30 g 0   metoprolol  succinate (TOPROL -XL) 50 MG 24 hr tablet TAKE 1 TABLET BY MOUTH TWICE A DAY WITH OR IMMEDIATELY FOLLOWING A MEAL 180 tablet 1   Multiple Minerals-Vitamins (CITRACAL PLUS PO) Take 1 tablet by mouth 2 (two) times daily.     rosuvastatin  (CRESTOR ) 20 MG tablet TAKE 1 TABLET BY MOUTH EVERY DAY 90 tablet 1   trospium  (SANCTURA ) 20 MG tablet TAKE 1 TABLET BY MOUTH EVERY DAY 90 tablet 1   UNABLE TO FIND COGNITIVE TABLET DAILY     valsartan  (DIOVAN ) 320 MG tablet TAKE 1 TABLET BY MOUTH EVERY DAY 90 tablet 1   Facility-Administered Medications Prior to Visit  Medication Dose Route Frequency Provider Last Rate Last Admin   cyanocobalamin  ((VITAMIN B-12)) injection 1,000 mcg  1,000 mcg Intramuscular Q30 days Crecencio Dodge, Yvonne R, DO   1,000 mcg at 05/16/23 1126   Review of Systems  Review of Systems  Constitutional: Negative.   HENT: Negative.    Respiratory: Negative.    Cardiovascular: Negative.    Physical Exam  BP 128/80   Pulse 83   Ht 5\' 5"  (1.651 m)   Wt 135 lb (61.2 kg)   SpO2 98%   BMI 22.47 kg/m  Physical  Exam Constitutional:      Appearance: Normal appearance.  HENT:     Head: Normocephalic and atraumatic.  Cardiovascular:     Rate and Rhythm: Normal rate and regular rhythm.  Pulmonary:     Effort: Pulmonary effort is normal.     Breath sounds: Normal breath sounds.  Musculoskeletal:        General: Normal range of motion.  Skin:    General: Skin is warm and dry.  Neurological:     General: No focal deficit present.     Mental Status: She is alert and oriented to person, place, and time. Mental status is at baseline.  Psychiatric:        Mood and Affect: Mood normal.        Behavior: Behavior normal.        Thought Content: Thought content normal.  Judgment: Judgment normal.      Lab Results:  CBC    Component Value Date/Time   WBC 5.3 05/28/2023 1051   RBC 4.03 05/28/2023 1052   RBC 4.04 05/28/2023 1051   HGB 13.5 05/28/2023 1051   HGB 13.3 11/27/2022 0942   HCT 38.4 05/28/2023 1051   PLT 191 05/28/2023 1051   PLT 186 11/27/2022 0942   MCV 95.0 05/28/2023 1051   MCH 33.4 05/28/2023 1051   MCHC 35.2 05/28/2023 1051   RDW 11.2 (L) 05/28/2023 1051   LYMPHSABS 1.9 03/08/2023 1117   MONOABS 0.8 03/08/2023 1117   EOSABS 0.2 03/08/2023 1117   BASOSABS 0.1 03/08/2023 1117    BMET    Component Value Date/Time   NA 143 05/28/2023 1051   K 3.6 05/28/2023 1051   CL 106 05/28/2023 1051   CO2 30 05/28/2023 1051   GLUCOSE 91 05/28/2023 1051   BUN 15 05/28/2023 1051   CREATININE 0.91 05/28/2023 1051   CREATININE 1.23 (H) 11/18/2019 0932   CALCIUM  9.0 05/28/2023 1051   GFRNONAA >60 05/28/2023 1051   GFRNONAA 38 (L) 02/14/2016 1012   GFRAA 44 (L) 02/14/2016 1012    BNP No results found for: "BNP"  ProBNP No results found for: "PROBNP"  Imaging: No results found.   Assessment & Plan:   1. Obstructive sleep apnea (Primary) - Ambulatory Referral for DME  Assessment and Plan    Obstructive Sleep Apnea Mild obstructive sleep apnea well-controlled  with CPAP therapy. Pressure 10-15cm h20. AHI reduced from 11 to 0.6 events per hour. Excellent CPAP compliance. Occasional air leaks noted but do not affect apnea control. Continued CPAP use essential to reduce risks of cardiac and neurological complications. - Order new CPAP machine through DME company as eligible for replacement after five years. - Instruct her to replace CPAP supplies as per recommended schedule: mask cushion monthly, mask frame every three months, tubing every three months, water chamber every six months, headgear every six months, and filter monthly. - Consider refitting for a mask if air leaks become bothersome.  Night Terrors Night terrors occur monthly, possibly linked to Aricept  or early cognitive changes. Episodes not frequent enough for medication. - Follow up with neurology for evaluation of potential cognitive issues, including Alzheimer's or dementia. - Ensure safety measures at home to prevent injury during night terrors.  Cognitive Changes On Aricept , possibly for memory changes. Night terrors may relate to early cognitive changes. - Follow up with neurology for further evaluation of cognitive changes and potential link to night terrors.    INSTRUCTIONS: Please follow up with neurology for further evaluation of your cognitive changes and night terrors. Continue using your CPAP machine nightly and replace the supplies as recommended. We will order a new CPAP machine for you. If you experience any new symptoms or have concerns, please contact our office.  Follow-up: 3 months CPAP compliance after getting new machine       Antonio Baumgarten, NP 06/20/2023

## 2023-06-20 NOTE — Patient Instructions (Addendum)
 -OBSTRUCTIVE SLEEP APNEA: Obstructive sleep apnea is a condition where your airway becomes blocked during sleep, causing breathing pauses. Your condition is well-controlled with your CPAP machine, reducing your apnea-hypopnea index (AHI) from 11 to 0.6 events per hour. You should continue using your CPAP machine every night and replace the supplies regularly: mask cushion monthly, mask frame every three months, tubing every three months, water chamber every six months, headgear every six months, and filter monthly. We will order a new CPAP machine for you as you are eligible for a replacement after five years. If air leaks become bothersome, consider getting refitted for a mask.  -COGNITIVE CHANGES: Cognitive changes refer to alterations in memory and thinking skills. You are currently taking Aricept for these symptoms. The night terrors you experience may be related to these cognitive changes. Please follow up with neurology for further evaluation and to explore any potential links between your symptoms.  INSTRUCTIONS: Please follow up with neurology for further evaluation of your cognitive changes and night terrors. Continue using your CPAP machine nightly and replace the supplies as recommended. We will order a new CPAP machine for you. If you experience any new symptoms or have concerns, please contact our office.  Follow-up: 3 months CPAP compliance after getting new machine    CPAP and BIPAP Information CPAP and BIPAP use air pressure to keep your airways open and help you breathe well. CPAP and BIPAP use different amounts of pressure. Your health care provider will tell you whether CPAP or BIPAP would be best for you. CPAP stands for continuous positive airway pressure. With CPAP, the amount of pressure stays the same while you breathe in and out. BIPAP stands for bi-level positive airway pressure. With BIPAP, the amount of pressure will be higher when you breathe in and lower when you  breathe out. This allows you to take bigger breaths. CPAP or BIPAP may be used in the hospital or at home. You may need to have a sleep study before your provider can order a device for you to use at home. What are the advantages? CPAP and BIPAP are most often used for obstructive sleep apnea to keep the airways from collapsing when the muscles relax during sleep. CPAP or BIPAP can be used if you have: Chronic obstructive pulmonary disease. Heart failure. Medical conditions that cause muscle weakness. Other problems that cause breathing to be shallow, weak, or difficult. What are the risks? Your provider will talk with you about risks. These may include: Sores on your nose or face caused from the mask, prongs, or nasal pillows. Dry or stuffy nose or nosebleeds. Feeling gassy or bloated. Sinus or lung infection if the equipment is not cleaned well. When should CPAP or BIPAP be used? In most cases, CPAP or BIPAP is used during sleep at night or whenever the main sleep time happens. It's also used during naps. People with some medical conditions may need to wear the mask when they're awake. Follow instructions from your provider about when to use your CPAP or BIPAP. What happens during CPAP or BIPAP?  Both CPAP and BIPAP use a small machine that uses electricity to create air pressure. A long tube connects the device to a plastic mask. Air is blown through the mask into your nose or mouth. The amount of pressure that's used to blow the air can be adjusted. Your provider will set the pressure setting and help you find the best mask for you. Tips for using the mask There  are different types and sizes of masks. If your mask does not fit well, talk with your provider about getting a different one. Some common types of masks include: Full face masks, which fit over the mouth and nose. Nasal masks, which fit over the nose. Nasal pillow or prong masks, which fit into the nostrils. The mask needs to be  snug to your face, so some people feel trapped or closed in at first. If you feel this way, you may need to get used to the mask. Hold the mask loosely over your nose or mouth and then gradually put the the mask on more snugly. Slowly increase the amount of time you use the mask. If you have trouble with your mask not fitting well or leaking, talk with your provider. Do not stop using the mask. Tips for using the device Follow instructions from your provider about how to and how often to use the device. For home use, CPAP and BIPAP devices come from home health care companies. There are many different brands. Your health insurance company will help to decide which device you get. Keep the CPAP or BIPAP device and attachments clean. Ask your home health care company or check the instruction book for cleaning instructions. Make sure the humidifier is filled with germ-free (sterile) water and is working correctly. This will help prevent a dry or stuffy nose or nosebleeds. A nasal saline mist or spray may keep your nose from getting dry and sore. Do not eat or drink while the CPAP or BIPAP device is on. Food or drinks could get pushed into your lungs by the pressure of the CPAP or BIPAP. Follow these instructions at home: Take over-the-counter and prescription medicines only as told by your provider. Do not smoke, vape, or use nicotine or tobacco. Contact a health care provider if: You have redness or pressure sores on your head, face, mouth, or nose from the mask or headgear. You have trouble using the CPAP or BIPAP device. You have trouble going to sleep or staying asleep. Someone tells you that you snore even when wearing your CPAP or BIPAP device. Get help right away if: You have trouble breathing. You feel confused. These symptoms may be an emergency. Get help right away. Call 911. Do not wait to see if the symptoms will go away. Do not drive yourself to the hospital. This information is  not intended to replace advice given to you by your health care provider. Make sure you discuss any questions you have with your health care provider. Document Revised: 06/14/2022 Document Reviewed: 06/14/2022 Elsevier Patient Education  2024 [employer].

## 2023-08-19 ENCOUNTER — Other Ambulatory Visit: Payer: Self-pay | Admitting: Family Medicine

## 2023-09-12 DIAGNOSIS — H35033 Hypertensive retinopathy, bilateral: Secondary | ICD-10-CM | POA: Diagnosis not present

## 2023-09-12 DIAGNOSIS — H3554 Dystrophies primarily involving the retinal pigment epithelium: Secondary | ICD-10-CM | POA: Diagnosis not present

## 2023-09-28 ENCOUNTER — Other Ambulatory Visit: Payer: Self-pay | Admitting: Family Medicine

## 2023-09-28 DIAGNOSIS — F0390 Unspecified dementia without behavioral disturbance: Secondary | ICD-10-CM

## 2023-10-13 ENCOUNTER — Other Ambulatory Visit: Payer: Self-pay | Admitting: Family Medicine

## 2023-10-15 NOTE — Progress Notes (Signed)
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 Occupational Stress Questionnaire    Feeling of Stress : Not at all  Social Connections: Socially Integrated (03/13/2023)   Social Connection and Isolation Panel    Frequency of Communication with Friends and Family: More than three times a week    Frequency of Social Gatherings with Friends and Family: More than three times a week    Attends Religious Services: More than 4 times per year    Active Member of [employer] or Organizations: Yes    Attends Banker Meetings: More than 4 times per year    Marital Status: Married  Catering manager Violence: Not At Risk (03/13/2023)   Humiliation, Afraid, Rape, and Kick questionnaire    Fear of Current or Ex-Partner: No    Emotionally Abused: No    Physically Abused: No    Sexually Abused: No    Family History:  Family History  Problem Relation Age of Onset   Stroke Mother 54   Hyperlipidemia Mother    Hypertension Mother    Heart attack Father 46   Hypertension Father    Heart disease Father    Hypertension Brother    Aneurysm Brother         aortic   Hyperlipidemia Brother    Heart disease Brother        aortic aneurysm rupture   Multiple sclerosis Daughter    Alcohol abuse Son    Schizophrenia Son    Bipolar disorder Son    Mental illness Son        schizophrenic, bipolar   Colon cancer Neg Hx    Colon polyps Neg Hx    Esophageal cancer Neg Hx    Rectal cancer Neg Hx    Stomach cancer Neg Hx    Alzheimer's disease Neg Hx     Medications:   Current Outpatient Medications on File Prior to Visit  Medication Sig Dispense Refill   acetaminophen  (TYLENOL ) 325 MG tablet Take 325 mg by mouth at bedtime.     amLODipine  (NORVASC ) 5 MG tablet Take 1 tablet (5 mg total) by mouth 2 (two) times daily. 180 tablet 0   aspirin  EC 81 MG tablet Take 1 tablet (81 mg total) by mouth daily.     donepezil  (ARICEPT ) 10 MG tablet TAKE 1/2 TABLET BY MOUTH AT BEDTIME (Patient taking differently: Take 10 mg by mouth at bedtime.) 45 tablet 1   folic acid  (FOLVITE ) 1 MG tablet TAKE 1 TABLET BY MOUTH EVERY DAY 90 tablet 2   furosemide  (LASIX ) 20 MG tablet Take 0.5 tablets (10 mg total) by mouth daily as needed for edema. 45 tablet 1   hydrocortisone  (ANUSOL -HC) 2.5 % rectal cream Place 1 Application rectally 2 (two) times daily. 30 g 0   Melatonin 3 MG CAPS Take 3 mg by mouth at bedtime.     metoprolol  succinate (TOPROL -XL) 50 MG 24 hr tablet TAKE 1 TABLET BY MOUTH TWICE A DAY WITH OR IMMEDIATELY FOLLOWING A MEAL 180 tablet 1   rosuvastatin  (CRESTOR ) 20 MG tablet TAKE 1 TABLET BY MOUTH EVERY DAY 90 tablet 1   trospium  (SANCTURA ) 20 MG tablet TAKE 1 TABLET BY MOUTH EVERY DAY 90 tablet 1   UNABLE TO FIND COGNITIVE TABLET DAILY     valsartan  (DIOVAN ) 320 MG tablet TAKE 1 TABLET BY MOUTH EVERY DAY 90 tablet 1   Current Facility-Administered Medications on File Prior to Visit  Medication Dose Route Frequency Provider Last Rate Last Admin   cyanocobalamin  ((VITAMIN B-12)) injection 1,000 mcg  1,000 mcg Intramuscular Q30 days Antonio Meth, Yvonne R,  DO   1,000 mcg at 05/16/23 1126    Allergies:  No Known Allergies    OBJECTIVE:  Physical Exam  Vitals:   10/16/23 1057 10/16/23 1110  BP: (!) 176/68 (!) 178/82  Pulse: (!) 44   Weight: 130 lb (59 kg)   Height: 5' 6 (1.676 m)    Body mass index is 20.98 kg/m. No results found.  General: well developed, well nourished, pleasant elderly Caucasian female, seated, in no evident distress  Neurologic Exam Mental Status: Awake and fully alert.  Occasional word finding difficulty but otherwise fluent speech.  No evidence of dysarthria.  Recent memory impaired and remote memory intact. Attention span, concentration and fund of knowledge impaired, frequently asks repeat questions or speaks off topic. Mood and affect appropriate.     10/16/2023   11:05 AM  03/26/2023   10:30 AM 12/26/2021    9:41 AM  MMSE - Mini Mental State Exam  Orientation to time 3 4 3   Orientation to Place 5 3 5   Registration 3 3 3   Attention/ Calculation 2 0 1  Recall 1 2 2   Language- name 2 objects 1 2 2   Language- repeat 0 1 1  Language- follow 3 step command 3 3 3   Language- read & follow direction 0 1 1  Write a sentence 1 1 1   Copy design 0 1 1  Total score 19 21 23    Cranial Nerves: Pupils equal, briskly reactive to light. Extraocular movements full without nystagmus. Visual fields full to confrontation. Hearing intact. Facial sensation intact. Face, tongue, palate moves normally and symmetrically.  Motor: Normal bulk and tone. Normal strength in all tested extremity muscles Sensory.: intact to touch , pinprick , position and vibratory sensation.  Coordination: Rapid alternating movements normal in all extremities. Finger-to-nose and heel-to-shin performed accurately bilaterally. Gait and Station: Arises from chair without difficulty. Stance is normal. Gait demonstrates normal stride length and balance without use of AD.  Reflexes: 1+ and symmetric. Toes downgoing.         ASSESSMENT: Keilana H  Bebee is a 77 y.o. year old female with ongoing memory loss concerns associated with aphasia. 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  Was seen by [facility] memory clinic but wishes for continued follow-ups in this office.     PLAN:  Memory loss:  Anxiety: MMSE 19/30 (prior 21/30) Suspect underlying anxiety possibly contributing to decline.  Recommend initiating citalopram  10 mg daily.  Advised to call after 4 to 6 weeks if no benefit for dosage adjustment or sooner if difficulty tolerating.  Medication information provided in AVS. Continue donepezil  10 mg nightly - recently filled by PCP Continue melatonin nightly to help with sleep Discussed importance of routine memory exercises such as crossword puzzles, word search, sudoku, jigsaw puzzles, card games, etc, also discussed the importance of routine exercise and maintaining a healthy diet Advised to call if interested in pursuing cognitive therapy, currently wishes to try doing exercises on her own at first    Follow-up in 6 months or call earlier if needed    CC:  PCP: Domenica Harlene LABOR, MD    I personally spent a total of 40 minutes in the care of the patient today including preparing to see the patient, performing a medically appropriate exam/evaluation, counseling and educating, placing orders, and documenting clinical information in the EHR.   Harlene Bogaert, AGNP-BC  [facility] [address] [place], [place] [address]  Phone [phone] Fax [phone] Note: This document was prepared with digital dictation and possible smart phrase technology. Any transcriptional errors that result from this process are unintentional.

## 2023-10-16 ENCOUNTER — Ambulatory Visit: Payer: PPO | Admitting: Adult Health

## 2023-10-16 ENCOUNTER — Encounter: Payer: Self-pay | Admitting: Adult Health

## 2023-10-16 VITALS — BP 178/82 | HR 44 | Ht 66.0 in | Wt 130.0 lb

## 2023-10-16 DIAGNOSIS — F419 Anxiety disorder, unspecified: Secondary | ICD-10-CM | POA: Diagnosis not present

## 2023-10-16 DIAGNOSIS — F0390 Unspecified dementia without behavioral disturbance: Secondary | ICD-10-CM

## 2023-10-16 MED ORDER — CITALOPRAM HYDROBROMIDE 10 MG PO TABS: 10.0000 mg | ORAL_TABLET | Freq: Every day | ORAL | 11 refills | Status: DC

## 2023-10-16 NOTE — Patient Instructions (Addendum)
 Your Plan:  Start Celexa  10mg  daily to help with anxiety  Continue Aricept  10mg  nightly   Continue melatonin nightly   Would highly recommend doing routine physical and cognitive exercises as well as ensuring good sleep, healthy diet and routine socialization      Follow up in 6 months or call earlier if needed      Thank you for coming to see us  at [facility]. I hope we have been able to provide you high quality care today.  You may receive a patient satisfaction survey over the next few weeks. We would appreciate your feedback and comments so that we may continue to improve ourselves and the health of our patients.     Citalopram  Tablets What is this medication? CITALOPRAM  (sye TAL oh pram) treats depression. It increases the amount of serotonin in the brain, a hormone that helps regulate mood. It belongs to a group of medications called SSRIs. This medicine may be used for other purposes; ask your health care provider or pharmacist if you have questions. COMMON BRAND NAME(S): Celexa  What should I tell my care team before I take this medication? They need to know if you have any of these conditions: Bipolar disorder or a family history of bipolar disorder Bleeding disorder Glaucoma Heart disease History of irregular heartbeat Kidney disease Liver disease Low levels of magnesium or potassium in the blood Receiving electroconvulsive therapy Seizures Suicidal thoughts, plans, or attempt by you or a family member Take medications that treat or prevent blood clots Thyroid  disease An unusual or allergic reaction to citalopram , other medications, foods, dyes, or preservatives Pregnant or trying to become pregnant Breastfeeding How should I use this medication? Take this medication by mouth with water. Take it as directed on the prescription label at the same time every day. You can take it with or without food. Do not take your medication more often  than directed. Do not stop taking this medication suddenly except upon the advice of your care team. Stopping this medication too quickly may cause serious side effects or your condition may worsen. A special MedGuide will be given to you by the pharmacist with each prescription and refill. Be sure to read this information carefully each time. Talk to your care team about the use of this medication in children. Special care may be needed. People 60 years and older may have a stronger reaction and need a smaller dose. Overdosage: If you think you have taken too much of this medicine contact a poison control center or emergency room at once. NOTE: This medicine is only for you. Do not share this medicine with others. What if I miss a dose? If you miss a dose, take it as soon as you can. If it is almost time for your next dose, take only that dose. Do not take double or extra doses. What may interact with this medication? Do not take this medication with any of the following: Certain medications for fungal infections, such as fluconazole, itraconazole, ketoconazole, posaconazole, voriconazole Cisapride Dronedarone Escitalopram Linezolid MAOIs, such as Carbex, Eldepryl, Marplan, Nardil, and Parnate Methylene blue (injected into a vein) Pimozide Thioridazine This medication may also interact with the following: Alcohol Amphetamines Aspirin  and aspirin -like medications Carbamazepine Certain medications for infections, such as chloroquine, clarithromycin, erythromycin, furazolidone, isoniazid, pentamidine Certain medications for mental health conditions Certain medications for migraine headaches, such as almotriptan, eletriptan, frovatriptan, naratriptan, rizatriptan, sumatriptan, zolmitriptan Certain medications for sleep Certain medications that treat or prevent blood clots, such  as dalteparin, enoxaparin,  warfarin Cimetidine Diuretics Dofetilide Fentanyl Lithium Methadone Metoprolol  NSAIDs, medications for pain and inflammation, such as ibuprofen or naproxen Omeprazole  Other medications that cause heart rhythm changes Procarbazine Rasagiline Supplements, such as St. John's wort, kava kava, valerian Tramadol Tryptophan Ziprasidone This list may not describe all possible interactions. Give your health care provider a list of all the medicines, herbs, non-prescription drugs, or dietary supplements you use. Also tell them if you smoke, drink alcohol, or use illegal drugs. Some items may interact with your medicine. What should I watch for while using this medication? Tell your care team if your symptoms do not get better or if they get worse. Visit your care team for regular checks on your progress. Because it may take several weeks to see the full effects of this medication, it is important to continue your treatment as prescribed. This medication may cause thoughts of suicide or depression. This includes sudden changes in mood, behaviors, or thoughts. These changes can happen at any time but are more common in the beginning of treatment or after a change in dose. Call your care team right away if you experience these thoughts or worsening depression. This medication may cause mood and behavior changes, such as anxiety, nervousness, irritability, hostility, restlessness, excitability, hyperactivity, or trouble sleeping. These changes can happen at any time but are more common in the beginning of treatment or after a change in dose. Call your care team right away if you notice any of these symptoms. This medication may affect your coordination, reaction time, or judgment. Do not drive or operate machinery until you know how this medication affects you. Sit up or stand slowly to reduce the risk of dizzy or fainting spells. Drinking alcohol with this medication can increase the risk of these side  effects. Your mouth may get dry. Chewing sugarless gum or sucking hard candy and drinking plenty of water may help. Contact your care team if the problem does not go away or is severe. What side effects may I notice from receiving this medication? Side effects that you should report to your care team as soon as possible: Allergic reactions--skin rash, itching, hives, swelling of the face, lips, tongue, or throat Bleeding--bloody or black, tar-like stools, red or dark brown urine, vomiting blood or brown material that looks like coffee grounds, small, red or purple spots on skin, unusual bleeding or bruising Heart rhythm changes--fast or irregular heartbeat, dizziness, feeling faint or lightheaded, chest pain, trouble breathing Low sodium level--muscle weakness, fatigue, dizziness, headache, confusion Serotonin syndrome--irritability, confusion, fast or irregular heartbeat, muscle stiffness, twitching muscles, sweating, high fever, seizure, chills, vomiting, diarrhea Sudden eye pain or change in vision such as blurry vision, seeing halos around lights, vision loss Thoughts of suicide or self-harm, worsening mood, feelings of depression Side effects that usually do not require medical attention (report to your care team if they continue or are bothersome): Change in sex drive or performance Diarrhea Dry mouth Excessive sweating Nausea Tremors or shaking Upset stomach This list may not describe all possible side effects. Call your doctor for medical advice about side effects. You may report side effects to FDA at 1-800-FDA-1088. Where should I keep my medication? Keep out of reach of children and pets. Store at room temperature between 15 and 30 degrees C (59 and 86 degrees F). Throw away any unused medication after the expiration date. NOTE: This sheet is a summary. It may not cover all possible information. If you have questions about this  medicine, talk to your doctor, pharmacist, or health  care provider.  2024 Elsevier/Gold Standard (2021-11-15 00:00:00)

## 2023-10-21 NOTE — Assessment & Plan Note (Signed)
Stable with current medications

## 2023-10-21 NOTE — Assessment & Plan Note (Signed)
 hgba1c acceptable, minimize simple carbs. Increase exercise as tolerated.

## 2023-10-21 NOTE — Assessment & Plan Note (Addendum)
 Tolerating statin. Encourage heart healthy diet such as MIND or DASH diet, increase exercise, avoid trans fats, simple carbohydrates and processed foods, consider a krill or fish or flaxseed oil cap daily.

## 2023-10-21 NOTE — Progress Notes (Addendum)
 Subjective:     Patient ID: Erika [name], female    DOB: 19-Feb-1946, 78 y.o.   MRN: [id number]  Chief Complaint  Patient presents with   Follow-up    HPI  Erika [name] is a 78 year old female presents for follow-up chronic conditions. Presents to OV with Erika [name], husband.   Patient reports increased fatigue throughout the day and often feels as though she is "falling asleep" during chores. She suspects some medications may be too sedating and has started taking them at night to help minimize daytime drowsiness.  Past medical history HTN, HLD, IBS, IDA, dementia, OSA, OAB, vitamin B12 deficiency.   Patient Care Team: Domenica Harlene LABOR, MD as PCP - General (Family Medicine) Johnnye Ade, MD as Consulting Physician (Obstetrics and Gynecology) Cleotilde Sewer, OD as Consulting Physician (Optometry) Vernetta Lonni GRADE, MD as Consulting Physician (Orthopedic Surgery) Norleen Dimes, DDS as Consulting Physician (Dentistry)   Aspirin  81 mg daily  OSA with CPAP -followed by pulmonology  HTN- amlodipine  5 mg twice daily, valsartan  320 mg daily Lasix  20 mg as needed for edema BP at home  HLD-Crestor  20 mg daily  OAB Trospium  (Sanctura ) 20 mg daily  Hx of multifactorial anemia including iron deficiency and B 12 deficiency- follows yearly with Heme/Onc folic acid  p.o. dailyPatient has not been taking B12 OTC.  She has been taking her folic acid  daily.  Patient taking medications as prescribed, reports no adverse side effects   Patient denies fever, chills, SOB, CP, palpitations, dyspnea, edema, HA, vision changes, N/V/D, abdominal pain, urinary symptoms, rash, weight changes, and recent illness or hospitalizations.    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   Cerebrovascular disease    Chest pain 01/19/2014   Chronic rhinitis 01/13/2009   Diverticulosis of colon    colonoscopy 04/14/1999   Dry mouth 03/13/2012   Fatigue 08/02/2014   GERD (gastroesophageal reflux disease)    Headache 02/11/2018   History of chicken pox    as a child   History of fibrocystic disease of breast    History of measles    as a child   History of mumps    as a child   Hyperlipidemia, mixed 02/20/2007   Goal LDL < 130 HBP/Pos fm hx   Hypertension    Hypokalemia 06/24/2012   Hyponatremia 08/02/2014   IBS (irritable bowel syndrome) 04/02/2011   IDA (iron deficiency anemia) 12/28/2020   Low vitamin D  level 01/09/2016   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   Obstructive sleep apnea 05/23/2017   uses CPAP nightly   Osteopenia after menopause    Overactive bladder 04/02/2011   Pedal edema 05/20/2020   Shoulder pain, bilateral 06/14/2011   Thoracic back pain 07/31/2017   Vitamin B 12 deficiency 10/16/2020    Past Surgical History:  Procedure Laterality Date   BREAST REDUCTION SURGERY Bilateral 1986   BUNIONECTOMY Bilateral 1980   CATARACT EXTRACTION, BILATERAL     COLONOSCOPY  2011   HAMMER TOE SURGERY Right  12/03/2013   foot   MOUTH SURGERY  01/10/2013   negative   TONSILLECTOMY     UPPER GASTROINTESTINAL ENDOSCOPY  2016   RK    Family History  Problem Relation Age of Onset   Stroke Mother 67   Hyperlipidemia Mother    Hypertension Mother    Heart attack Father 50   Hypertension Father    Heart disease Father    Hypertension Brother    Aneurysm Brother        aortic   Hyperlipidemia Brother    Heart disease Brother        aortic aneurysm rupture   Multiple sclerosis Daughter    Alcohol  abuse Son    Schizophrenia Son    Bipolar disorder Son    Mental illness Son        schizophrenic, bipolar   Colon cancer Neg Hx    Colon polyps Neg Hx    Esophageal cancer Neg Hx    Rectal cancer Neg Hx    Stomach cancer Neg Hx    Alzheimer's disease Neg Hx     Social History   Socioeconomic History   Marital status: Married    Spouse name: Not on file   Number of children: 4   Years of education: 14   Highest education level: Associate degree: academic program  Occupational History   Occupation: Retired    Comment: Tax inspector  Tobacco Use   Smoking status: Never   Smokeless tobacco: Never  Vaping Use   Vaping status: Never Used  Substance and Sexual Activity   Alcohol use: No   Drug use: No   Sexual activity: Not Currently  Other Topics Concern   Not on file  Social History Narrative   4 children 2 are adopted      Lives at home with husband, children live closeby    Right handed   Caffeine: maybe 1-3 or more cups/day   Social Drivers of Corporate investment banker Strain: Low Risk  (03/13/2023)   Overall Financial Resource Strain (CARDIA)    Difficulty of Paying Living Expenses: Not hard at all  Food Insecurity: No Food Insecurity (03/13/2023)   Hunger Vital Sign    Worried About Running Out of Food in the Last Year: Never true    Ran Out of Food in the Last Year: Never true  Transportation Needs: No Transportation Needs (03/13/2023)   PRAPARE - Administrator, Civil Service (Medical): No    Lack of Transportation (Non-Medical): No  Physical Activity: Sufficiently Active (03/13/2023)   Exercise Vital Sign    Days of Exercise per Week: 5 days    Minutes of Exercise per Session: 30 min  Stress: No Stress Concern Present (03/13/2023)   [employer] of Occupational Health - Occupational Stress Questionnaire    Feeling of Stress : Not at all  Social Connections: Socially Integrated (03/13/2023)   Social Connection and Isolation Panel     Frequency of Communication with Friends and Family: More than three times a week    Frequency of Social Gatherings with Friends and Family: More than three times a week    Attends Religious Services: More than 4 times per year    Active Member of [employer] or Organizations: Yes    Attends Banker Meetings: More than 4 times per year    Marital Status: Married  Catering manager Violence: Not At Risk (03/13/2023)   Humiliation, Afraid, Rape, and Kick questionnaire  Fear of Current or Ex-Partner: No    Emotionally Abused: No    Physically Abused: No    Sexually Abused: No    Outpatient Medications Prior to Visit  Medication Sig Dispense Refill   acetaminophen  (TYLENOL ) 325 MG tablet Take 325 mg by mouth at bedtime.     amLODipine  (NORVASC ) 5 MG tablet Take 1 tablet (5 mg total) by mouth 2 (two) times daily. 180 tablet 0   aspirin  EC 81 MG tablet Take 1 tablet (81 mg total) by mouth daily.     citalopram  (CELEXA ) 10 MG tablet Take 1 tablet (10 mg total) by mouth daily. 30 tablet 11   donepezil  (ARICEPT ) 10 MG tablet TAKE 1/2 TABLET BY MOUTH AT BEDTIME (Patient taking differently: Take 10 mg by mouth at bedtime.) 45 tablet 1   folic acid  (FOLVITE ) 1 MG tablet TAKE 1 TABLET BY MOUTH EVERY DAY 90 tablet 2   furosemide  (LASIX ) 20 MG tablet Take 0.5 tablets (10 mg total) by mouth daily as needed for edema. 45 tablet 1   hydrocortisone  (ANUSOL -HC) 2.5 % rectal cream Place 1 Application rectally 2 (two) times daily. 30 g 0   Melatonin 3 MG CAPS Take 3 mg by mouth at bedtime.     rosuvastatin  (CRESTOR ) 20 MG tablet TAKE 1 TABLET BY MOUTH EVERY DAY 90 tablet 1   trospium  (SANCTURA ) 20 MG tablet TAKE 1 TABLET BY MOUTH EVERY DAY 90 tablet 1   UNABLE TO FIND COGNITIVE TABLET DAILY     valsartan  (DIOVAN ) 320 MG tablet TAKE 1 TABLET BY MOUTH EVERY DAY 90 tablet 1   metoprolol  succinate (TOPROL -XL) 50 MG 24 hr tablet TAKE 1 TABLET BY MOUTH TWICE A DAY WITH OR IMMEDIATELY FOLLOWING A MEAL 180  tablet 1   Facility-Administered Medications Prior to Visit  Medication Dose Route Frequency Provider Last Rate Last Admin   cyanocobalamin  ((VITAMIN B-12)) injection 1,000 mcg  1,000 mcg Intramuscular Q30 days Antonio Meth, Yvonne R, DO   1,000 mcg at 05/16/23 1126    No Known Allergies  ROS   See HPI Objective:    Physical Exam  General: No acute distress. Awake and conversant.  Eyes: Normal conjunctiva, anicteric. Round symmetric pupils.  ENT: Hearing grossly intact. No nasal discharge.  Neck: Neck is supple. No masses or thyromegaly.  Respiratory: CTAB. Respirations are non-labored. No wheezing.  Skin: Warm. No rashes or ulcers.  Psych: Alert and oriented. Cooperative, CV: RRR. No murmur. No lower extremity edema.  MSK: Normal ambulation. No clubbing or cyanosis.  Neuro:  CN II-XII grossly normal.  Some word finding difficult at times.   BP 130/60 (BP Location: Left Arm, Patient Position: Sitting, Cuff Size: Normal)   Pulse (!) 43   Temp 98.9 F (37.2 C) (Oral)   Resp 12   Ht 5' 6 (1.676 m)   Wt 136 lb 9.6 oz (62 kg)   SpO2 96%   BMI 22.05 kg/m  Wt Readings from Last 3 Encounters:  10/25/23 136 lb 9.6 oz (62 kg)  10/16/23 130 lb (59 kg)  06/20/23 135 lb (61.2 kg)       Assessment & Plan:   Problem List Items Addressed This Visit     B12 deficiency   Supplement and monitor      Relevant Orders   Retic (Completed)   B12 and Folate Panel (Completed)   Cerebrovascular disease   Follows with neurology.      Relevant Medications   metoprolol  succinate (TOPROL -XL) 50 MG  24 hr tablet   Other Relevant Orders   TSH (Completed)   GERD (gastroesophageal reflux disease)   Stable on current medications.      Hyperglycemia   hgba1c acceptable, minimize simple carbs. Increase exercise as tolerated.      Relevant Orders   Comprehensive metabolic panel with GFR (Completed)   HgB A1c (Completed)   Hyperlipidemia, mixed   Tolerating statin.  Encourage heart  healthy diet such as MIND or DASH diet, increase exercise, avoid trans fats, simple carbohydrates and processed foods, consider a krill or fish or flaxseed oil cap daily.         Relevant Medications   metoprolol  succinate (TOPROL -XL) 50 MG 24 hr tablet   Other Relevant Orders   Lipid panel (Completed)   Hypertension   Decrease Metoprolol  to 25 mg daily. Encourage monitoring BP at home and bring readings to clinic at FU visit.   Patient reports fatigue and daytime drowsiness with heart rates in the 40s. She has a Hx bradycardia; however, if symptoms persist despite reducing metoprolol  to 25 mg daily, consider discontinuing metoprolol  and adjusting alternative HTN medications as needed.       Relevant Medications   metoprolol  succinate (TOPROL -XL) 50 MG 24 hr tablet   IDA (iron deficiency anemia)   Followed by Hematology for iron levels, last checked in September and were normal. -Check iron levels today.         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 On donezepil 10 mg at bedtime.  Recently started on citalopram  10 mg daily by neurology. Mood stable. No adverse Ses reported. Doing well at home.      Overactive bladder   Stable with current medications      Vitamin D  deficiency   Supplement and monitor.      Relevant Orders   VITAMIN D  25 Hydroxy (Vit-D Deficiency, Fractures) (Completed)   Other Visit Diagnoses       Preventative health care    -  Primary      Fatigue Update CBC, B12, Iron panel today. Fatigue and daytime sluggishness may be related to metoprolol . Will continue to monitor, and if fatigue persists in the absence of anemia, consider discontinuing metoprolol .  FU 3 months  Portions of this note were dictated using DRAGON voice  recognition software. Please disregard any errors in transcription.    I have changed Charle H. Chaput's metoprolol  succinate. I am also having her maintain her aspirin  EC, UNABLE TO FIND, furosemide , acetaminophen , folic acid , hydrocortisone , rosuvastatin , valsartan , trospium , amLODipine , donepezil , Melatonin, and citalopram . We will continue to administer cyanocobalamin .  Meds ordered this encounter  Medications   metoprolol  succinate (TOPROL -XL) 50 MG 24 hr tablet    Sig: Take 0.5 tablets (25 mg total) by mouth daily. Take with or immediately following a meal.    Dispense:  180 tablet    Refill:  1    Supervising Provider:   DOMENICA BLACKBIRD A [[id number]]

## 2023-10-21 NOTE — Assessment & Plan Note (Signed)
 Supplement and monitor

## 2023-10-21 NOTE — Assessment & Plan Note (Signed)
 Stable on current medications

## 2023-10-21 NOTE — Assessment & Plan Note (Signed)
 Followed by Hematology for iron levels, last checked in September and were normal. -Check iron levels today.

## 2023-10-21 NOTE — Assessment & Plan Note (Signed)
 Follows with neurology

## 2023-10-25 ENCOUNTER — Encounter: Payer: Self-pay | Admitting: Student

## 2023-10-25 ENCOUNTER — Ambulatory Visit (INDEPENDENT_AMBULATORY_CARE_PROVIDER_SITE_OTHER): Admitting: Student

## 2023-10-25 VITALS — BP 130/60 | HR 43 | Temp 98.9°F | Resp 12 | Ht 66.0 in | Wt 136.6 lb

## 2023-10-25 DIAGNOSIS — F02A0 Dementia in other diseases classified elsewhere, mild, without behavioral disturbance, psychotic disturbance, mood disturbance, and anxiety: Secondary | ICD-10-CM | POA: Diagnosis not present

## 2023-10-25 DIAGNOSIS — R739 Hyperglycemia, unspecified: Secondary | ICD-10-CM

## 2023-10-25 DIAGNOSIS — I679 Cerebrovascular disease, unspecified: Secondary | ICD-10-CM | POA: Diagnosis not present

## 2023-10-25 DIAGNOSIS — K219 Gastro-esophageal reflux disease without esophagitis: Secondary | ICD-10-CM | POA: Diagnosis not present

## 2023-10-25 DIAGNOSIS — G3109 Other frontotemporal dementia: Secondary | ICD-10-CM | POA: Diagnosis not present

## 2023-10-25 DIAGNOSIS — Z0000 Encounter for general adult medical examination without abnormal findings: Secondary | ICD-10-CM | POA: Diagnosis not present

## 2023-10-25 DIAGNOSIS — E538 Deficiency of other specified B group vitamins: Secondary | ICD-10-CM | POA: Diagnosis not present

## 2023-10-25 DIAGNOSIS — D509 Iron deficiency anemia, unspecified: Secondary | ICD-10-CM

## 2023-10-25 DIAGNOSIS — I10 Essential (primary) hypertension: Secondary | ICD-10-CM | POA: Diagnosis not present

## 2023-10-25 DIAGNOSIS — E782 Mixed hyperlipidemia: Secondary | ICD-10-CM

## 2023-10-25 DIAGNOSIS — E559 Vitamin D deficiency, unspecified: Secondary | ICD-10-CM | POA: Diagnosis not present

## 2023-10-25 DIAGNOSIS — N3281 Overactive bladder: Secondary | ICD-10-CM | POA: Diagnosis not present

## 2023-10-25 LAB — COMPREHENSIVE METABOLIC PANEL WITH GFR
ALT: 13 U/L (ref 0–35)
AST: 18 U/L (ref 0–37)
Albumin: 4.5 g/dL (ref 3.5–5.2)
Alkaline Phosphatase: 69 U/L (ref 39–117)
BUN: 15 mg/dL (ref 6–23)
CO2: 33 meq/L — ABNORMAL HIGH (ref 19–32)
Calcium: 10.1 mg/dL (ref 8.4–10.5)
Chloride: 102 meq/L (ref 96–112)
Creatinine, Ser: 1.07 mg/dL (ref 0.40–1.20)
GFR: 50.01 mL/min — ABNORMAL LOW (ref >60.00)
Glucose, Bld: 89 mg/dL (ref 70–99)
Potassium: 3.9 meq/L (ref 3.5–5.1)
Sodium: 143 meq/L (ref 135–145)
Total Bilirubin: 1 mg/dL (ref 0.2–1.2)
Total Protein: 7.4 g/dL (ref 6.0–8.3)

## 2023-10-25 LAB — TSH: TSH: 1.62 u[IU]/mL (ref 0.35–5.50)

## 2023-10-25 LAB — LIPID PANEL
Cholesterol: 143 mg/dL (ref 0–200)
HDL: 49.1 mg/dL (ref >39.00)
LDL Cholesterol: 74 mg/dL (ref 0–99)
NonHDL: 93.78
Total CHOL/HDL Ratio: 3
Triglycerides: 97 mg/dL (ref 0.0–149.0)
VLDL: 19.4 mg/dL (ref 0.0–40.0)

## 2023-10-25 LAB — CBC WITH DIFFERENTIAL/PLATELET
Basophils Absolute: 0.1 K/uL (ref 0.0–0.1)
Basophils Relative: 1 % (ref 0.0–3.0)
Eosinophils Absolute: 0.2 K/uL (ref 0.0–0.7)
Eosinophils Relative: 2.7 % (ref 0.0–5.0)
HCT: 43.3 % (ref 36.0–46.0)
Hemoglobin: 14.8 g/dL (ref 12.0–15.0)
Lymphocytes Relative: 25.5 % (ref 12.0–46.0)
Lymphs Abs: 1.6 K/uL (ref 0.7–4.0)
MCHC: 34.2 g/dL (ref 30.0–36.0)
MCV: 95.1 fl (ref 78.0–100.0)
Monocytes Absolute: 0.5 K/uL (ref 0.1–1.0)
Monocytes Relative: 8.6 % (ref 3.0–12.0)
Neutro Abs: 3.9 K/uL (ref 1.4–7.7)
Neutrophils Relative %: 62.2 % (ref 43.0–77.0)
Platelets: 201 K/uL (ref 150.0–400.0)
RBC: 4.55 Mil/uL (ref 3.87–5.11)
RDW: 11.9 % (ref 11.5–15.5)
WBC: 6.2 K/uL (ref 4.0–10.5)

## 2023-10-25 LAB — VITAMIN D 25 HYDROXY (VIT D DEFICIENCY, FRACTURES): VITD: 38.19 ng/mL (ref 30.00–100.00)

## 2023-10-25 LAB — B12 AND FOLATE PANEL
Folate: >23.4 ng/mL (ref >5.9)
Vitamin B-12: 331 pg/mL (ref 211–911)

## 2023-10-25 LAB — HEMOGLOBIN A1C: Hgb A1c MFr Bld: 5.4 % (ref 4.6–6.5)

## 2023-10-25 MED ORDER — METOPROLOL SUCCINATE ER 50 MG PO TB24: 25.0000 mg | ORAL_TABLET | Freq: Every day | ORAL | 1 refills | Status: DC

## 2023-10-25 NOTE — Assessment & Plan Note (Addendum)
 Decrease Metoprolol  to 25 mg daily. Encourage monitoring BP at home and bring readings to clinic at FU visit.   Patient reports fatigue and daytime drowsiness with heart rates in the 40s. She has a Hx bradycardia; however, if symptoms persist despite reducing metoprolol  to 25 mg daily, consider discontinuing metoprolol  and adjusting alternative HTN medications as needed.

## 2023-10-25 NOTE — Patient Instructions (Addendum)
 Get TDAP, Prevnar 20, and Flu vaccine  Decrease Metoprolol - Start taking 0.5 tab of metoprolol  (25 mg) daily.

## 2023-10-26 ENCOUNTER — Ambulatory Visit: Payer: Self-pay | Admitting: Student

## 2023-10-26 LAB — RETICULOCYTES
ABS Retic: 71520 {cells}/uL (ref 20000–80000)
Retic Ct Pct: 1.6 %

## 2023-10-26 LAB — IRON,TIBC AND FERRITIN PANEL
%SAT: 36 % (ref 16–45)
Ferritin: 194 ng/mL (ref 16–288)
Iron: 108 ug/dL (ref 45–160)
TIBC: 296 ug/dL (ref 250–450)

## 2023-10-30 NOTE — Assessment & Plan Note (Signed)
 Followed by neurology. On donezepil 10 mg at bedtime.  Recently started on citalopram  10 mg daily by neurology. Mood stable. No adverse Ses reported. Doing well at home.

## 2023-11-07 ENCOUNTER — Other Ambulatory Visit: Payer: Self-pay | Admitting: Adult Health

## 2023-11-17 ENCOUNTER — Other Ambulatory Visit: Payer: Self-pay | Admitting: Family Medicine

## 2023-11-22 ENCOUNTER — Other Ambulatory Visit: Payer: Self-pay | Admitting: Family Medicine

## 2023-11-27 ENCOUNTER — Inpatient Hospital Stay: Payer: PPO

## 2023-11-27 ENCOUNTER — Inpatient Hospital Stay: Payer: PPO | Admitting: Medical Oncology

## 2023-12-10 ENCOUNTER — Inpatient Hospital Stay: Attending: Hematology & Oncology

## 2023-12-10 ENCOUNTER — Other Ambulatory Visit: Payer: Self-pay | Admitting: Medical Oncology

## 2023-12-10 ENCOUNTER — Inpatient Hospital Stay (HOSPITAL_BASED_OUTPATIENT_CLINIC_OR_DEPARTMENT_OTHER): Admitting: Medical Oncology

## 2023-12-10 VITALS — BP 158/62 | HR 49 | Temp 98.1°F | Resp 18 | Ht 66.0 in | Wt 131.0 lb

## 2023-12-10 DIAGNOSIS — R7983 Abnormal findings of blood amino-acid level: Secondary | ICD-10-CM

## 2023-12-10 DIAGNOSIS — D509 Iron deficiency anemia, unspecified: Secondary | ICD-10-CM | POA: Diagnosis not present

## 2023-12-10 DIAGNOSIS — D529 Folate deficiency anemia, unspecified: Secondary | ICD-10-CM | POA: Insufficient documentation

## 2023-12-10 DIAGNOSIS — E538 Deficiency of other specified B group vitamins: Secondary | ICD-10-CM

## 2023-12-10 DIAGNOSIS — F0390 Unspecified dementia without behavioral disturbance: Secondary | ICD-10-CM | POA: Insufficient documentation

## 2023-12-10 LAB — VITAMIN B12: Vitamin B-12: 303 pg/mL (ref 180–914)

## 2023-12-10 LAB — CMP (CANCER CENTER ONLY)
ALT: 11 U/L (ref 0–44)
AST: 21 U/L (ref 15–41)
Albumin: 4.3 g/dL (ref 3.5–5.0)
Alkaline Phosphatase: 70 U/L (ref 38–126)
Anion gap: 10 (ref 5–15)
BUN: 14 mg/dL (ref 8–23)
CO2: 28 mmol/L (ref 22–32)
Calcium: 9.6 mg/dL (ref 8.9–10.3)
Chloride: 104 mmol/L (ref 98–111)
Creatinine: 1.02 mg/dL — ABNORMAL HIGH (ref 0.44–1.00)
GFR, Estimated: 56 mL/min — ABNORMAL LOW (ref >60)
Glucose, Bld: 90 mg/dL (ref 70–99)
Potassium: 3.6 mmol/L (ref 3.5–5.1)
Sodium: 142 mmol/L (ref 135–145)
Total Bilirubin: 0.8 mg/dL (ref 0.0–1.2)
Total Protein: 7.1 g/dL (ref 6.5–8.1)

## 2023-12-10 LAB — CBC WITH DIFFERENTIAL (CANCER CENTER ONLY)
Abs Immature Granulocytes: 0.01 K/uL (ref 0.00–0.07)
Basophils Absolute: 0.1 K/uL (ref 0.0–0.1)
Basophils Relative: 1 %
Eosinophils Absolute: 0.2 K/uL (ref 0.0–0.5)
Eosinophils Relative: 3 %
HCT: 42.4 % (ref 36.0–46.0)
Hemoglobin: 14.6 g/dL (ref 12.0–15.0)
Immature Granulocytes: 0 %
Lymphocytes Relative: 31 %
Lymphs Abs: 2.1 K/uL (ref 0.7–4.0)
MCH: 32.4 pg (ref 26.0–34.0)
MCHC: 34.4 g/dL (ref 30.0–36.0)
MCV: 94.2 fL (ref 80.0–100.0)
Monocytes Absolute: 0.6 K/uL (ref 0.1–1.0)
Monocytes Relative: 9 %
Neutro Abs: 3.7 K/uL (ref 1.7–7.7)
Neutrophils Relative %: 56 %
Platelet Count: 204 K/uL (ref 150–400)
RBC: 4.5 MIL/uL (ref 3.87–5.11)
RDW: 11.2 % — ABNORMAL LOW (ref 11.5–15.5)
WBC Count: 6.7 K/uL (ref 4.0–10.5)
nRBC: 0 % (ref 0.0–0.2)

## 2023-12-10 LAB — IRON AND IRON BINDING CAPACITY (CC-WL,HP ONLY)
Iron: 105 ug/dL (ref 28–170)
Saturation Ratios: 31 % (ref 10.4–31.8)
TIBC: 339 ug/dL (ref 250–450)
UIBC: 234 ug/dL

## 2023-12-10 LAB — FERRITIN: Ferritin: 139 ng/mL (ref 11–307)

## 2023-12-10 NOTE — Progress Notes (Signed)
 Hematology and Oncology Follow Up Visit  Erika [name] [id number] 04-Feb-1946 78 y.o. 12/10/2023   Principle Diagnosis:  Iron deficiency anemia  Elevated homocystine level    Current Therapy:        IV iron as indicated  Folic acid  1 mg PO Daily   Interim History:  Erika [name] is here today for follow-up. Her husband brought her in today.   They report progression of her dementia. This is slow by steady. Other than this she is doing well.   No fever, chills, n/v, cough, rash, dizziness, SOB, chest pain, palpitations, abdominal pain or changes in bowel or bladder habits at this time.  No blood loss noted. No bruising or petechiae.  No swelling, tenderness, numbness or tingling in her extremities.  No falls or syncope.  Appetite and hydration are fair. She eats 3 meals per day but often picks at the food on her plate  Wt Readings from Last 3 Encounters:  12/10/23 131 lb (59.4 kg)  10/25/23 136 lb 9.6 oz (62 kg)  10/16/23 130 lb (59 kg)    ECOG Performance Status: 1 - Symptomatic but completely ambulatory  Medications:  Allergies as of 12/10/2023   No Known Allergies      Medication List        Accurate as of December 10, 2023 12:37 PM. If you have any questions, ask your nurse or doctor.          acetaminophen  325 MG tablet Commonly known as: TYLENOL  Take 325 mg by mouth at bedtime.   amLODipine  5 MG tablet Commonly known as: NORVASC  Take 1 tablet (5 mg total) by mouth 2 (two) times daily.   aspirin  EC 81 MG tablet Take 1 tablet (81 mg total) by mouth daily.   citalopram  10 MG tablet Commonly known as: CELEXA  Take 1 tablet (10 mg total) by mouth daily.   donepezil  10 MG tablet Commonly known as: ARICEPT  TAKE 1/2 TABLET BY MOUTH AT BEDTIME What changed: how much to take   folic acid  1 MG tablet Commonly known as: FOLVITE  TAKE 1 TABLET BY MOUTH EVERY DAY   furosemide  20 MG tablet Commonly known as: LASIX  Take 0.5 tablets (10 mg total) by mouth  daily as needed for edema.   hydrocortisone  2.5 % rectal cream Commonly known as: ANUSOL -HC Place 1 Application rectally 2 (two) times daily.   Melatonin 3 MG Caps Take 3 mg by mouth at bedtime.   metoprolol  succinate 50 MG 24 hr tablet Commonly known as: TOPROL -XL Take 0.5 tablets (25 mg total) by mouth daily. Take with or immediately following a meal.   rosuvastatin  20 MG tablet Commonly known as: CRESTOR  Take 1 tablet (20 mg total) by mouth daily.   trospium  20 MG tablet Commonly known as: SANCTURA  TAKE 1 TABLET BY MOUTH EVERY DAY   UNABLE TO FIND COGNITIVE TABLET DAILY   valsartan  320 MG tablet Commonly known as: DIOVAN  Take 1 tablet (320 mg total) by mouth daily.        Allergies: No Known Allergies  Past Medical History, Surgical history, Social history, and Family History were reviewed and updated.  Review of Systems: All other 10 point review of systems is negative.   Physical Exam:  height is 5' 6 (1.676 m) and weight is 131 lb (59.4 kg). Her oral temperature is 98.1 F (36.7 C). Her blood pressure is 158/62 (abnormal) and her pulse is 49 (abnormal). Her respiration is 18 and oxygen saturation is 100%.   Wt  Readings from Last 3 Encounters:  12/10/23 131 lb (59.4 kg)  10/25/23 136 lb 9.6 oz (62 kg)  10/16/23 130 lb (59 kg)    Ocular: Sclerae unicteric, pupils equal, round and reactive to light Ear-nose-throat: Oropharynx clear, dentition fair Lymphatic: No cervical or supraclavicular adenopathy Lungs no rales or rhonchi, good excursion bilaterally Heart regular rate and rhythm, no murmur appreciated Abd soft, nontender, positive bowel sounds MSK no focal spinal tenderness, no joint edema Neuro: non-focal, well-oriented, appropriate affect  Lab Results  Component Value Date   WBC 6.7 12/10/2023   HGB 14.6 12/10/2023   HCT 42.4 12/10/2023   MCV 94.2 12/10/2023   PLT 204 12/10/2023   Lab Results  Component Value Date   FERRITIN 194 10/25/2023    IRON 108 10/25/2023   TIBC 296 10/25/2023   UIBC 187 05/28/2023   IRONPCTSAT 36 10/25/2023   Lab Results  Component Value Date   RETICCTPCT 1.6 10/25/2023   RBC 4.50 12/10/2023   RETICCTABS 71,520 10/25/2023   No results found for: KPAFRELGTCHN, LAMBDASER, KAPLAMBRATIO No results found for: IGGSERUM, IGA, IGMSERUM No results found for: STEPHANY CARLOTA BENSON MARKEL EARLA JOANNIE DOC VICK, SPEI   Chemistry      Component Value Date/Time   NA 142 12/10/2023 1103   K 3.6 12/10/2023 1103   CL 104 12/10/2023 1103   CO2 28 12/10/2023 1103   BUN 14 12/10/2023 1103   CREATININE 1.02 (H) 12/10/2023 1103   CREATININE 1.23 (H) 11/18/2019 0932      Component Value Date/Time   CALCIUM  9.6 12/10/2023 1103   ALKPHOS 70 12/10/2023 1103   AST 21 12/10/2023 1103   ALT 11 12/10/2023 1103   BILITOT 0.8 12/10/2023 1103      Encounter Diagnoses  Name Primary?   Iron deficiency anemia, unspecified iron deficiency anemia type Yes   Anemia due to folic acid  deficiency, unspecified deficiency type    Hyperhomocystinemia     Impression and Plan: Erika [name] is a very pleasant 78 yo caucasian female with history of multifactorial anemia including iron deficiency and B 12 deficiency (managed by PCP). She also had a slightly elevated homocystine level.  She will continue her Folic acid  PO daily.  Hgb is stable at 14.6 which is stable.  B12 and iron pending.    RTC 12 months (Pt request- PCP to check labs every 3-6 months)APP, labs ( CBC , BMP, retic, iron, ferritin, homocysteine)  Lauraine CHRISTELLA Dais, PA-C 10/6/202512:37 PM

## 2023-12-12 LAB — HOMOCYSTEINE: Homocysteine: 10.3 umol/L (ref 0.0–19.2)

## 2023-12-13 ENCOUNTER — Ambulatory Visit: Payer: Self-pay | Admitting: Medical Oncology

## 2023-12-14 ENCOUNTER — Other Ambulatory Visit: Payer: Self-pay | Admitting: Family Medicine

## 2023-12-31 ENCOUNTER — Other Ambulatory Visit: Payer: Self-pay | Admitting: Family Medicine

## 2023-12-31 MED ORDER — ROSUVASTATIN CALCIUM 20 MG PO TABS: 20.0000 mg | ORAL_TABLET | Freq: Every day | ORAL | 1 refills | Status: DC

## 2023-12-31 MED ORDER — VALSARTAN 320 MG PO TABS: 320.0000 mg | ORAL_TABLET | Freq: Every day | ORAL | 1 refills | Status: AC

## 2023-12-31 NOTE — Telephone Encounter (Signed)
 Copied from CRM [id number]. Topic: Clinical - Medication Refill >> Dec 31, 2023  8:48 AM Kendralyn S wrote: Medication: trospium  (SANCTURA ) 20 MG tablet  Has the patient contacted their pharmacy? No (Agent: If no, request that the patient contact the pharmacy for the refill. If patient does not wish to contact the pharmacy document the reason why and proceed with request.) (Agent: If yes, when and what did the pharmacy advise?)  This is the patient's preferred pharmacy:   [id number] - [place], [place] - [address] AT [address] OF [address] [place] [address] Phone: [phone] Fax: [phone]  Is this the correct pharmacy for this prescription? Yes If no, delete pharmacy and type the correct one.   Has the prescription been filled recently? No  Is the patient out of the medication? Yes  Has the patient been seen for an appointment in the last year OR does the patient have an upcoming appointment? Yes  Can we respond through MyChart? No  Agent: Please be advised that Rx refills may take up to 3 business days. We ask that you follow-up with your pharmacy.

## 2023-12-31 NOTE — Telephone Encounter (Signed)
 Copied from CRM [id number]. Topic: Clinical - Medication Refill >> Dec 31, 2023  8:44 AM Kendralyn S wrote: Medication:   rosuvastatin  (CRESTOR ) 20 MG tablet valsartan  (DIOVAN ) 320 MG tablet    Has the patient contacted their pharmacy? No (Agent: If no, request that the patient contact the pharmacy for the refill. If patient does not wish to contact the pharmacy document the reason why and proceed with request.) (Agent: If yes, when and what did the pharmacy advise?)  This is the patient's preferred pharmacy:   [id number] [place], [place] - [address] [place] [address] Phone: [phone] Fax: [phone]    Is this the correct pharmacy for this prescription? Yes If no, delete pharmacy and type the correct one.   Has the prescription been filled recently? No  Is the patient out of the medication? Yes  Has the patient been seen for an appointment in the last year OR does the patient have an upcoming appointment? Yes  Can we respond through MyChart? No  Agent: Please be advised that Rx refills may take up to 3 business days. We ask that you follow-up with your pharmacy.

## 2024-01-25 ENCOUNTER — Ambulatory Visit: Admitting: Student

## 2024-01-25 ENCOUNTER — Encounter: Payer: Self-pay | Admitting: Student

## 2024-01-25 VITALS — BP 145/59 | HR 47 | Temp 98.4°F | Ht 66.0 in | Wt 129.2 lb

## 2024-01-25 DIAGNOSIS — R739 Hyperglycemia, unspecified: Secondary | ICD-10-CM

## 2024-01-25 DIAGNOSIS — G4733 Obstructive sleep apnea (adult) (pediatric): Secondary | ICD-10-CM

## 2024-01-25 DIAGNOSIS — G3109 Other frontotemporal dementia: Secondary | ICD-10-CM

## 2024-01-25 DIAGNOSIS — F02A0 Dementia in other diseases classified elsewhere, mild, without behavioral disturbance, psychotic disturbance, mood disturbance, and anxiety: Secondary | ICD-10-CM | POA: Diagnosis not present

## 2024-01-25 DIAGNOSIS — E782 Mixed hyperlipidemia: Secondary | ICD-10-CM | POA: Diagnosis not present

## 2024-01-25 DIAGNOSIS — D509 Iron deficiency anemia, unspecified: Secondary | ICD-10-CM | POA: Diagnosis not present

## 2024-01-25 DIAGNOSIS — I10 Essential (primary) hypertension: Secondary | ICD-10-CM

## 2024-01-25 DIAGNOSIS — E559 Vitamin D deficiency, unspecified: Secondary | ICD-10-CM | POA: Diagnosis not present

## 2024-01-25 DIAGNOSIS — R410 Disorientation, unspecified: Secondary | ICD-10-CM | POA: Diagnosis not present

## 2024-01-25 DIAGNOSIS — M8580 Other specified disorders of bone density and structure, unspecified site: Secondary | ICD-10-CM | POA: Diagnosis not present

## 2024-01-25 DIAGNOSIS — R630 Anorexia: Secondary | ICD-10-CM | POA: Diagnosis not present

## 2024-01-25 DIAGNOSIS — R319 Hematuria, unspecified: Secondary | ICD-10-CM | POA: Diagnosis not present

## 2024-01-25 LAB — POC URINALSYSI DIPSTICK (AUTOMATED)
Bilirubin, UA: NEGATIVE
Glucose, UA: NEGATIVE
Ketones, UA: NEGATIVE
Nitrite, UA: NEGATIVE
Protein, UA: POSITIVE — AB
Spec Grav, UA: <=1.005 — AB (ref 1.010–1.025)
Urobilinogen, UA: 0.2 U/dL
pH, UA: 6.5 (ref 5.0–8.0)

## 2024-01-25 MED ORDER — MIRTAZAPINE 7.5 MG PO TABS: 7.5000 mg | ORAL_TABLET | Freq: Every day | ORAL | 1 refills | Status: AC

## 2024-01-25 MED ORDER — METOPROLOL TARTRATE 25 MG PO TABS: 25.0000 mg | ORAL_TABLET | Freq: Every day | ORAL | Status: AC

## 2024-01-25 MED ORDER — AMLODIPINE BESYLATE 5 MG PO TABS: 5.0000 mg | ORAL_TABLET | Freq: Two times a day (BID) | ORAL | 1 refills | Status: AC

## 2024-01-25 NOTE — Assessment & Plan Note (Signed)
 Encouraged to get adequate exercise, calcium and vitamin d intake

## 2024-01-25 NOTE — Assessment & Plan Note (Signed)
 Supplement and Monitor

## 2024-01-25 NOTE — Assessment & Plan Note (Signed)
 BP elevated during office visit.  Encourage monitoring blood pressure at home, goal BP >140/90.  Return to clinic if BP greater than 140/90.

## 2024-01-25 NOTE — Assessment & Plan Note (Signed)
 Last hgba1c acceptable, minimize simple carbs. Increase exercise as tolerated.

## 2024-01-25 NOTE — Assessment & Plan Note (Signed)
 Followed by neurology. On donezepil 5mg  HS Citalopram  10 mg daily by neurology. Mood stable. No adverse Ses reported. Doing well at home.

## 2024-01-25 NOTE — Assessment & Plan Note (Signed)
 Follows with Pulmonology. Stable on CPAP.

## 2024-01-25 NOTE — Assessment & Plan Note (Signed)
 Tolerating statin.  Encourage heart healthy diet such as MIND or DASH diet, increase exercise, avoid trans fats, simple carbohydrates and processed foods, consider a krill or fish or flaxseed oil cap dail

## 2024-01-25 NOTE — Progress Notes (Signed)
 Subjective:     Patient ID: Erika [name], female    DOB: [birth year], 78 y.o.   MRN: [id number]  No chief complaint on file.   HPI  Erika [name] is a 78 year old female presents for follow-up chronic conditions. Presents to OV with Vaughan, husband.   Past medical history-dementia, HTN, HLD, IBS, IDA, dementia, OSA, OAB, vitamin B12 deficiency.  Follows with-neurology, pulmonology, hematology  Aspirin  81 mg daily  Dementia-follows with neurology Donepezil  (Aricept ) 5 mg HS citalopram  10 mg daily   OSA with CPAP -followed by pulmonology  HTN- amlodipine  5 mg twice daily, valsartan  320 mg daily, metoprolol  25 mg daily BP at home  HLD-Crestor  20 mg daily  OAB Trospium  (Sanctura ) 20 mg daily  Hx of multifactorial anemia- follows yearly with Heme/Onc, Dr. Ennever folic acid  p.o. dailyPatient has not been taking B12 OTC.  She has been taking her folic acid  daily.  Patient taking medications as prescribed, reports no adverse side effects  Patient denies fever, chills, SOB, CP, palpitations, dyspnea, edema, HA, vision changes, N/V/D, abdominal pain, urinary symptoms, rash, weight changes, and recent illness or hospitalizations.    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   Cerebrovascular disease    Chest pain 01/19/2014   Chronic rhinitis 01/13/2009   Diverticulosis of colon    colonoscopy 04/14/1999   Dry mouth 03/13/2012   Fatigue 08/02/2014   GERD (gastroesophageal reflux disease)    Headache 02/11/2018   History of chicken pox    as a child   History of fibrocystic disease of breast    History of measles    as a child    History of mumps    as a child   Hyperlipidemia, mixed 02/20/2007   Goal LDL < 130 HBP/Pos fm hx   Hypertension    Hypokalemia 06/24/2012   Hyponatremia 08/02/2014   IBS (irritable bowel syndrome) 04/02/2011   IDA (iron deficiency anemia) 12/28/2020   Low vitamin D  level 01/09/2016   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   Obstructive sleep apnea 05/23/2017   uses CPAP nightly   Osteopenia after menopause    Overactive bladder 04/02/2011   Pedal edema 05/20/2020   Shoulder pain, bilateral 06/14/2011   Thoracic back pain 07/31/2017   Vitamin B 12 deficiency 10/16/2020    Past Surgical History:  Procedure Laterality Date   BREAST REDUCTION SURGERY Bilateral 1986   BUNIONECTOMY Bilateral 1980   CATARACT EXTRACTION, BILATERAL     COLONOSCOPY  2011   HAMMER TOE SURGERY Right 12/03/2013   foot   MOUTH SURGERY  01/10/2013   negative   TONSILLECTOMY     UPPER GASTROINTESTINAL ENDOSCOPY  2016   RK    Family History  Problem Relation Age of Onset   Stroke Mother 69   Hyperlipidemia Mother    Hypertension Mother    Heart attack Father 49   Hypertension Father    Heart disease Father    Hypertension Brother    Aneurysm Brother        aortic  Hyperlipidemia Brother    Heart disease Brother        aortic aneurysm rupture   Multiple sclerosis Daughter    Alcohol abuse Son    Schizophrenia Son    Bipolar disorder Son    Mental illness Son        schizophrenic, bipolar   Colon cancer Neg Hx    Colon polyps Neg Hx    Esophageal cancer Neg Hx    Rectal cancer Neg Hx    Stomach cancer Neg Hx    Alzheimer's disease Neg Hx     Social History   Socioeconomic History   Marital status: Married    Spouse name: Not on file   Number of children: 4   Years of education: 14   Highest education level: Associate degree: academic program  Occupational History   Occupation: Retired     Comment: tax inspector  Tobacco Use   Smoking status: Never   Smokeless tobacco: Never  Vaping Use   Vaping status: Never Used  Substance and Sexual Activity   Alcohol use: No   Drug use: No   Sexual activity: Not Currently  Other Topics Concern   Not on file  Social History Narrative   4 children 2 are adopted      Lives at home with husband, children live closeby    Right handed   Caffeine: maybe 1-3 or more cups/day   Social Drivers of Corporate Investment Banker Strain: Low Risk  (03/13/2023)   Overall Financial Resource Strain (CARDIA)    Difficulty of Paying Living Expenses: Not hard at all  Food Insecurity: No Food Insecurity (03/13/2023)   Hunger Vital Sign    Worried About Running Out of Food in the Last Year: Never true    Ran Out of Food in the Last Year: Never true  Transportation Needs: No Transportation Needs (03/13/2023)   PRAPARE - Administrator, Civil Service (Medical): No    Lack of Transportation (Non-Medical): No  Physical Activity: Sufficiently Active (03/13/2023)   Exercise Vital Sign    Days of Exercise per Week: 5 days    Minutes of Exercise per Session: 30 min  Stress: No Stress Concern Present (03/13/2023)   [employer] of Occupational Health - Occupational Stress Questionnaire    Feeling of Stress : Not at all  Social Connections: Socially Integrated (03/13/2023)   Social Connection and Isolation Panel    Frequency of Communication with Friends and Family: More than three times a week    Frequency of Social Gatherings with Friends and Family: More than three times a week    Attends Religious Services: More than 4 times per year    Active Member of [employer] or Organizations: Yes    Attends Engineer, Structural: More than 4 times per year    Marital Status: Married  Catering Manager Violence: Not At Risk (03/13/2023)   Humiliation, Afraid, Rape, and Kick questionnaire    Fear of Current or Ex-Partner: No    Emotionally Abused:  No    Physically Abused: No    Sexually Abused: No    Outpatient Medications Prior to Visit  Medication Sig Dispense Refill   acetaminophen  (TYLENOL ) 325 MG tablet Take 325 mg by mouth at bedtime.     aspirin  EC 81 MG tablet Take 1 tablet (81 mg total) by mouth daily.     citalopram  (CELEXA ) 10 MG tablet Take 1 tablet (10 mg total) by mouth daily. 90  tablet 3   donepezil  (ARICEPT ) 10 MG tablet TAKE 1/2 TABLET BY MOUTH AT BEDTIME (Patient taking differently: Take 10 mg by mouth at bedtime.) 45 tablet 1   folic acid  (FOLVITE ) 1 MG tablet TAKE 1 TABLET BY MOUTH EVERY DAY 90 tablet 2   hydrocortisone  (ANUSOL -HC) 2.5 % rectal cream Place 1 Application rectally 2 (two) times daily. 30 g 0   Melatonin 3 MG CAPS Take 3 mg by mouth at bedtime.     rosuvastatin  (CRESTOR ) 20 MG tablet Take 1 tablet (20 mg total) by mouth daily. 90 tablet 1   trospium  (SANCTURA ) 20 MG tablet TAKE 1 TABLET BY MOUTH EVERY DAY 90 tablet 1   UNABLE TO FIND COGNITIVE TABLET DAILY     valsartan  (DIOVAN ) 320 MG tablet Take 1 tablet (320 mg total) by mouth daily. 90 tablet 1   amLODipine  (NORVASC ) 5 MG tablet Take 1 tablet (5 mg total) by mouth 2 (two) times daily. 180 tablet 0   furosemide  (LASIX ) 20 MG tablet Take 0.5 tablets (10 mg total) by mouth daily as needed for edema. 45 tablet 1   metoprolol  succinate (TOPROL -XL) 50 MG 24 hr tablet Take 0.5 tablets (25 mg total) by mouth daily. Take with or immediately following a meal. 180 tablet 1   Facility-Administered Medications Prior to Visit  Medication Dose Route Frequency Provider Last Rate Last Admin   cyanocobalamin  ((VITAMIN B-12)) injection 1,000 mcg  1,000 mcg Intramuscular Q30 days Antonio Meth, Yvonne R, DO   1,000 mcg at 05/16/23 1126    No Known Allergies  ROS   See HPI Objective:    Physical Exam Vitals reviewed.  Constitutional:      General: She is not in acute distress.    Appearance: She is not toxic-appearing.  HENT:     Head: Normocephalic and  atraumatic.     Mouth/Throat:     Mouth: Mucous membranes are moist.     Pharynx: Oropharynx is clear.  Eyes:     Extraocular Movements: Extraocular movements intact.     Pupils: Pupils are equal, round, and reactive to light.  Cardiovascular:     Rate and Rhythm: Normal rate and regular rhythm.     Pulses: Normal pulses.     Heart sounds: Normal heart sounds. No murmur heard. Pulmonary:     Effort: Pulmonary effort is normal. No respiratory distress.     Breath sounds: Normal breath sounds. No wheezing.  Musculoskeletal:        General: No swelling.     Cervical back: Neck supple.  Skin:    General: Skin is warm and dry.  Neurological:     Mental Status: She is alert. Mental status is at baseline.  Psychiatric:        Mood and Affect: Mood normal.     Comments: Difficulty finding words, confusion     BP (!) 145/59   Pulse (!) 47   Temp 98.4 F (36.9 C) (Oral)   Ht 5' 6 (1.676 m)   Wt 129 lb 3.2 oz (58.6 kg)   SpO2 99%   BMI 20.85 kg/m  Wt Readings from Last 3 Encounters:  01/25/24 129 lb 3.2 oz (58.6 kg)  12/10/23 131 lb (59.4 kg)  10/25/23 136 lb 9.6 oz (62 kg)       Assessment & Plan:   Problem List Items Addressed This Visit     Hyperglycemia   Last hgba1c acceptable, minimize simple carbs. Increase exercise as tolerated.  Hyperlipidemia, mixed   Tolerating statin.  Encourage heart healthy diet such as MIND or DASH diet, increase exercise, avoid trans fats, simple carbohydrates and processed foods, consider a krill or fish or flaxseed oil cap dail          Relevant Medications   amLODipine  (NORVASC ) 5 MG tablet   metoprolol  tartrate (LOPRESSOR ) 25 MG tablet   Hypertension   BP elevated during office visit.  Encourage monitoring blood pressure at home, goal BP >140/90.  Return to clinic if BP greater than 140/90.      Relevant Medications   amLODipine  (NORVASC ) 5 MG tablet   metoprolol  tartrate (LOPRESSOR ) 25 MG tablet   IDA (iron deficiency  anemia)   Hx of multifactorial anemia, Follows with Hematology.           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 On donezepil 5mg  HS Citalopram  10 mg daily by neurology. Mood stable. No adverse Ses reported. Doing well at home.      Relevant Medications   mirtazapine  (REMERON ) 7.5 MG tablet   Obstructive sleep apnea   Follows with Pulmonology. Stable on CPAP.      Osteopenia after menopause   Encouraged to get adequate exercise, calcium  and vitamin d  intake       Vitamin D  deficiency   Supplement and Monitor      Other Visit Diagnoses       Confusion       Relevant Orders   POCT Urinalysis Dipstick (Automated) (Completed)   Urine Culture     Hematuria, unspecified type       Relevant Orders   Urine Culture     Poor appetite       Relevant Medications   mirtazapine  (REMERON ) 7.5 MG tablet      FU 4 months  Portions of this note were dictated using DRAGON voice recognition software. Please disregard any errors in transcription.    I have discontinued Seila H. Barua's furosemide  and metoprolol  succinate. I am also having her start on mirtazapine  and metoprolol  tartrate. Additionally, I am having her maintain her aspirin  EC, UNABLE TO FIND, acetaminophen , folic acid , hydrocortisone , donepezil , Melatonin, citalopram , trospium , rosuvastatin , valsartan , and amLODipine . We will continue to administer cyanocobalamin .  Meds ordered this encounter  Medications   amLODipine  (NORVASC ) 5 MG tablet    Sig: Take 1 tablet (5 mg total) by mouth 2 (two) times daily.    Dispense:  180 tablet    Refill:  1   mirtazapine  (REMERON ) 7.5 MG tablet    Sig: Take 1 tablet (7.5 mg total) by mouth at bedtime.    Dispense:  90 tablet    Refill:  1    Supervising Provider:   DOMENICA BLACKBIRD A [[id number]]   metoprolol  tartrate (LOPRESSOR ) 25 MG tablet    Sig: Take 1 tablet (25 mg total) by mouth daily.    Supervising  Provider:   DOMENICA BLACKBIRD A [phone]

## 2024-01-25 NOTE — Assessment & Plan Note (Signed)
 Hx of multifactorial anemia, Follows with Hematology.

## 2024-01-26 LAB — URINE CULTURE
MICRO NUMBER:: 17267992
SPECIMEN QUALITY:: ADEQUATE

## 2024-01-28 ENCOUNTER — Other Ambulatory Visit: Payer: Self-pay | Admitting: Family Medicine

## 2024-01-28 DIAGNOSIS — F0390 Unspecified dementia without behavioral disturbance: Secondary | ICD-10-CM

## 2024-01-28 MED ORDER — DONEPEZIL HCL 10 MG PO TABS: 5.0000 mg | ORAL_TABLET | Freq: Every day | ORAL | 1 refills | Status: AC

## 2024-01-28 NOTE — Telephone Encounter (Signed)
 Copied from CRM #[id number]. Topic: Clinical - Medication Refill >> Jan 28, 2024  8:33 AM Eva FALCON wrote: Medication: donepezil  (ARICEPT ) 10 MG tablet  Has the patient contacted their pharmacy? No, 0 refills on bottle, every time they call they just say to contact DR. (Agent: If no, request that the patient contact the pharmacy for the refill. If patient does not wish to contact the pharmacy document the reason why and proceed with request.) (Agent: If yes, when and what did the pharmacy advise?)  This is the patient's preferred pharmacy:  [id number] [address], [place] - [address] [place] [address] Phone: [phone] Fax: [phone]   Is this the correct pharmacy for this prescription? Yes If no, delete pharmacy and type the correct one.   Has the prescription been filled recently? Yes  Is the patient out of the medication? no  Has the patient been seen for an appointment in the last year OR does the patient have an upcoming appointment? Yes  Can we respond through MyChart? No, prefers phone call.  Agent: Please be advised that Rx refills may take up to 3 business days. We ask that you follow-up with your pharmacy.

## 2024-02-06 ENCOUNTER — Telehealth: Payer: Self-pay | Admitting: Family Medicine

## 2024-02-06 DIAGNOSIS — F0390 Unspecified dementia without behavioral disturbance: Secondary | ICD-10-CM

## 2024-02-06 NOTE — Telephone Encounter (Unsigned)
 Copied from CRM [id number]. Topic: Clinical - Medication Refill >> Feb 06, 2024  8:57 AM Suzen RAMAN wrote: Medication: donepezil  (ARICEPT ) 10 MG tablet   Has the patient contacted their pharmacy? Yes   This is the patient's preferred pharmacy:  [id number] [place], [place] - [address] [place] [address] Phone: [phone] Fax: [phone]  Is this the correct pharmacy for this prescription? Yes If no, delete pharmacy and type the correct one.   Has the prescription been filled recently? No  Is the patient out of the medication? Yes  Has the patient been seen for an appointment in the last year OR does the patient have an upcoming appointment? Yes  Can we respond through MyChart? Yes  Agent: Please be advised that Rx refills may take up to 3 business days. We ask that you follow-up with your pharmacy.

## 2024-02-07 NOTE — Telephone Encounter (Signed)
 donepezil  (ARICEPT ) 10 MG tablet 45 tablet 1 01/28/2024 --   Sig - Route: Take 0.5 tablets (5 mg total) by mouth at bedtime. - Oral   Sent to pharmacy as: donepezil  (ARICEPT ) 10 MG tablet   E-Prescribing Status: Receipt confirmed by pharmacy (01/28/2024 12:05 PM EST)     Rx already sent on 01/28/24 for 45 tablets and 1 refill. Error CRM sent.

## 2024-02-08 NOTE — Telephone Encounter (Signed)
 [name], Erika K   02/08/2024 12:04 PM  Type: General  Thank you for submitting this issue for investigation. After a thorough review, we have determined that an error was made by an [id number] team member. We have addressed the matter directly with the agent involved. We appreciate you bringing this to our attention.   Error/Investigation Details: Patient husband called in asking for a refill on the donepezil  due to accidentally giving her a whole tablet instead of a half tablet and running out of the script. Specialist did not review the medication list to see that the pharmacy should have a refill available. Specialist has been instructed to call patient back and advise that pharmacy should be able to refill. There is potential for the patient to still request an early fill if pharmacy states it is too early for insurance to cover, but that is yet to be determined. [employer] JTAPI ID: [id number]

## 2024-03-18 ENCOUNTER — Ambulatory Visit: Payer: PPO

## 2024-03-18 VITALS — BP 118/62 | HR 60 | Temp 98.0°F | Ht 66.0 in | Wt 131.6 lb

## 2024-03-18 DIAGNOSIS — Z0000 Encounter for general adult medical examination without abnormal findings: Secondary | ICD-10-CM

## 2024-03-18 NOTE — Progress Notes (Signed)
 "  Chief Complaint  Patient presents with   Medicare Wellness     Subjective:   Erika [name] is a 79 y.o. female who presents for a Medicare Annual Wellness Visit.  Visit info / Clinical Intake: Medicare Wellness Visit Type:: Subsequent Annual Wellness Visit Persons participating in visit and providing information:: patient (Husband with patient) Medicare Wellness Visit Mode:: In-person (required for WTM) Interpreter Needed?: No Pre-visit prep was completed: yes AWV questionnaire completed by patient prior to visit?: no Living arrangements:: lives with spouse/significant other Patient's Overall Health Status Rating: good Typical amount of pain: none Does pain affect daily life?: no Are you currently prescribed opioids?: no  Dietary Habits and Nutritional Risks How many meals a day?: 3 Eats fruit and vegetables daily?: yes Most meals are obtained by: preparing own meals In the last 2 weeks, have you had any of the following?: none Diabetic:: no  Functional Status Activities of Daily Living (to include ambulation/medication): Independent Ambulation: Independent with device- listed below Home Assistive Devices/Equipment: Eyeglasses Medication Administration: Independent Home Management (perform basic housework or laundry): Needs assistance (comment) (Family assist) Manage your own finances?: (!) no (Husband assist) Primary transportation is: family / friends Concerns about vision?: no *vision screening is required for WTM* Concerns about hearing?: no  Fall Screening Falls in the past year?: 1 Number of falls in past year: 0 Was there an injury with Fall?: 0 Fall Risk Category Calculator: 1 Patient Fall Risk Level: Low Fall Risk  Fall Risk Patient at Risk for Falls Due to: Other (Comment) (Missed step lost balance) Fall risk Follow up: Falls evaluation completed; Education provided; Falls prevention discussed  Home and Transportation Safety: All rugs have non-skid  backing?: yes All stairs or steps have railings?: yes Grab bars in the bathtub or shower?: yes Have non-skid surface in bathtub or shower?: yes Good home lighting?: yes Regular seat belt use?: yes Hospital stays in the last year:: no  Cognitive Assessment Difficulty concentrating, remembering, or making decisions? : yes Will 6CIT or Mini Cog be Completed: no 6CIT or Mini Cog Declined: patient has a diagnosis of dementia or cognitive impairment  Advance Directives (For Healthcare) Does Patient Have a Medical Advance Directive?: Yes Does patient want to make changes to medical advance directive?: No - Patient declined Type of Advance Directive: Healthcare Power of [place]; Living will Copy of Healthcare Power of Attorney in Chart?: No - copy requested Copy of Living Will in Chart?: No - copy requested  Reviewed/Updated  Reviewed/Updated: Reviewed All (Medical, Surgical, Family, Medications, Allergies, Care Teams, Patient Goals)    Allergies (verified) Patient has no known allergies.   Current Medications (verified) Outpatient Encounter Medications as of 03/18/2024  Medication Sig   acetaminophen  (TYLENOL ) 325 MG tablet Take 325 mg by mouth at bedtime.   amLODipine  (NORVASC ) 5 MG tablet Take 1 tablet (5 mg total) by mouth 2 (two) times daily.   aspirin  EC 81 MG tablet Take 1 tablet (81 mg total) by mouth daily.   citalopram  (CELEXA ) 10 MG tablet Take 1 tablet (10 mg total) by mouth daily.   donepezil  (ARICEPT ) 10 MG tablet Take 0.5 tablets (5 mg total) by mouth at bedtime.   folic acid  (FOLVITE ) 1 MG tablet TAKE 1 TABLET BY MOUTH EVERY DAY   hydrocortisone  (ANUSOL -HC) 2.5 % rectal cream Place 1 Application rectally 2 (two) times daily.   Melatonin 3 MG CAPS Take 3 mg by mouth at bedtime.   metoprolol  tartrate (LOPRESSOR ) 25 MG tablet Take 1  tablet (25 mg total) by mouth daily.   mirtazapine  (REMERON ) 7.5 MG tablet Take 1 tablet (7.5 mg total) by mouth at bedtime.   rosuvastatin   (CRESTOR ) 20 MG tablet Take 1 tablet (20 mg total) by mouth daily.   trospium  (SANCTURA ) 20 MG tablet TAKE 1 TABLET BY MOUTH EVERY DAY   UNABLE TO FIND COGNITIVE TABLET DAILY   valsartan  (DIOVAN ) 320 MG tablet Take 1 tablet (320 mg total) by mouth daily.   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   Cerebrovascular disease    Chest pain 01/19/2014   Chronic rhinitis 01/13/2009   Diverticulosis of colon    colonoscopy 04/14/1999   Dry mouth 03/13/2012   Fatigue 08/02/2014   GERD (gastroesophageal reflux disease)    Headache 02/11/2018   History of chicken pox    as a child   History of fibrocystic disease of breast    History of measles    as a child   History of mumps    as a child   Hyperlipidemia, mixed 02/20/2007   Goal LDL < 130 HBP/Pos fm hx   Hypertension    Hypokalemia 06/24/2012   Hyponatremia 08/02/2014   IBS (irritable bowel syndrome) 04/02/2011   IDA (iron deficiency anemia) 12/28/2020   Low vitamin D  level 01/09/2016   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   Obstructive sleep apnea 05/23/2017   uses CPAP nightly   Osteopenia after menopause    Overactive bladder 04/02/2011   Pedal edema 05/20/2020   Shoulder pain, bilateral 06/14/2011   Thoracic back pain 07/31/2017   Vitamin B 12 deficiency 10/16/2020   Past Surgical History:  Procedure Laterality Date   BREAST REDUCTION SURGERY Bilateral 1986   BUNIONECTOMY Bilateral 1980   CATARACT EXTRACTION, BILATERAL     COLONOSCOPY  2011   HAMMER TOE SURGERY Right 12/03/2013   foot   MOUTH SURGERY  01/10/2013   negative    TONSILLECTOMY     UPPER GASTROINTESTINAL ENDOSCOPY  2016   RK   Family History  Problem Relation Age of Onset   Stroke Mother 51   Hyperlipidemia Mother    Hypertension Mother    Heart attack Father 9   Hypertension Father    Heart disease Father    Hypertension Brother    Aneurysm Brother        aortic   Hyperlipidemia Brother    Heart disease Brother        aortic aneurysm rupture   Multiple sclerosis Daughter    Alcohol abuse Son    Schizophrenia Son    Bipolar disorder Son    Mental illness Son        schizophrenic, bipolar   Colon cancer Neg Hx    Colon polyps Neg Hx    Esophageal cancer Neg Hx    Rectal cancer Neg Hx    Stomach cancer Neg Hx    Alzheimer's disease Neg Hx    Social History   Occupational History   Occupation: Retired    Comment: tax inspector  Tobacco Use   Smoking status: Never   Smokeless tobacco: Never  Vaping Use   Vaping status: Never Used  Substance and Sexual Activity   Alcohol use: No   Drug use: No   Sexual activity: Not Currently   Tobacco Counseling Counseling given: No  SDOH Screenings   Food Insecurity: No Food Insecurity (03/18/2024)  Housing: Low Risk (03/18/2024)  Transportation Needs: No Transportation Needs (03/18/2024)  Utilities: Not At Risk (03/18/2024)  Alcohol Screen: Low Risk (03/13/2023)  Depression (PHQ2-9): Low Risk (03/18/2024)  Recent Concern: Depression (PHQ2-9) - High Risk (01/25/2024)  Financial Resource Strain: Low Risk (03/13/2023)  Physical Activity: Inactive (03/18/2024)  Social Connections: Socially Integrated (03/18/2024)  Stress: No Stress Concern Present (03/18/2024)  Tobacco Use: Low Risk (03/18/2024)  Health Literacy: Adequate Health Literacy (03/18/2024)   See flowsheets for full screening details  Depression Screen PHQ 2 & 9 Depression Scale- Over the past 2 weeks, how often have you been bothered by any of the following problems? Little interest or pleasure in doing things: 0 Feeling down,  depressed, or hopeless (PHQ Adolescent also includes...irritable): 0 PHQ-2 Total Score: 0 Trouble falling or staying asleep, or sleeping too much: 0 Feeling tired or having little energy: 0 Poor appetite or overeating (PHQ Adolescent also includes...weight loss): 0 Feeling bad about yourself - or that you are a failure or have let yourself or your family down: 0 Trouble concentrating on things, such as reading the newspaper or watching television (PHQ Adolescent also includes...like school work): 0 Moving or speaking so slowly that other people could have noticed. Or the opposite - being so fidgety or restless that you have been moving around a lot more than usual: 0 Thoughts that you would be better off dead, or of hurting yourself in some way: 0 PHQ-9 Total Score: 0 If you checked off any problems, how difficult have these problems made it for you to do your work, take care of things at home, or get along with other people?: Not difficult at all  Depression Treatment Depression Interventions/Treatment : EYV7-0 Score <4 Follow-up Not Indicated     Goals Addressed               This Visit's Progress     Remain active (pt-stated)               Objective:    Today's Vitals   03/18/24 1133  BP: 118/62  Pulse: 60  Temp: 98 F (36.7 C)  TempSrc: Oral  SpO2: 96%  Weight: 131 lb 9.6 oz (59.7 kg)  Height: 5' 6 (1.676 m)   Body mass index is 21.24 kg/m.  Hearing/Vision screen Hearing Screening - Comments:: Denies hearing difficulties   Vision Screening - Comments:: Wears rx glasses - up to date with routine eye exams with  [facility] Immunizations and Health Maintenance Health Maintenance  Topic Date Due   Pneumococcal Vaccine: 50+ Years (3 of 3 - PCV20 or PCV21) 01/14/2019   DTaP/Tdap/Td (2 - Tdap) 03/06/2020   Influenza Vaccine  10/05/2023   Medicare Annual Wellness (AWV)  03/18/2025   Bone Density Scan  Completed   Hepatitis C Screening  Completed   Zoster  Vaccines- Shingrix  Completed   Meningococcal B Vaccine  Aged Out   Mammogram  Discontinued   Colonoscopy  Discontinued   COVID-19 Vaccine  Discontinued        Assessment/Plan:  This is a routine wellness examination for Treyana.  Patient Care Team: Domenica Harlene LABOR, MD as PCP - General (Family Medicine) Johnnye Ade, MD as Consulting Physician (Obstetrics and Gynecology) Cleotilde Sewer, OD as Consulting Physician (Optometry)  Vernetta Lonni GRADE, MD as Consulting Physician (Orthopedic Surgery) Norleen Dimes, DDS as Consulting Physician (Dentistry)  I have personally reviewed and noted the following in the patients chart:   Medical and social history Use of alcohol, tobacco or illicit drugs  Current medications and supplements including opioid prescriptions. Functional ability and status Nutritional status Physical activity Advanced directives List of other physicians Hospitalizations, surgeries, and ER visits in previous 12 months Vitals Screenings to include cognitive, depression, and falls Referrals and appointments  No orders of the defined types were placed in this encounter.  In addition, I have reviewed and discussed with patient certain preventive protocols, quality metrics, and best practice recommendations. A written personalized care plan for preventive services as well as general preventive health recommendations were provided to patient.   Rojelio LELON Blush, LPN   [id number]/86/7973   Return in 53 weeks (on 03/24/2025).  After Visit Summary: (In Person-Printed) AVS printed and given to the patient  Nurse Notes: No voiced or noted concerns at this time "

## 2024-03-18 NOTE — Patient Instructions (Addendum)
 Erika [name],  Thank you for taking the time for your Medicare Wellness Visit. I appreciate your continued commitment to your health goals. Please review the care plan we discussed, and feel free to reach out if I can assist you further.  Please note that Annual Wellness Visits do not include a physical exam. Some assessments may be limited, especially if the visit was conducted virtually. If needed, we may recommend an in-person follow-up with your provider.  Ongoing Care Seeing your primary care provider every 3 to 6 months helps us  monitor your health and provide consistent, personalized care.   Referrals If a referral was made during today's visit and you haven't received any updates within two weeks, please contact the referred provider directly to check on the status.  Recommended Screenings:  Health Maintenance  Topic Date Due   Pneumococcal Vaccine for age over 57 (3 of 3 - PCV20 or PCV21) 01/14/2019   DTaP/Tdap/Td vaccine (2 - Tdap) 03/06/2020   Flu Shot  10/05/2023   Medicare Annual Wellness Visit  03/18/2025   Osteoporosis screening with Bone Density Scan  Completed   Hepatitis C Screening  Completed   Zoster (Shingles) Vaccine  Completed   Meningitis B Vaccine  Aged Out   Breast Cancer Screening  Discontinued   Colon Cancer Screening  Discontinued   COVID-19 Vaccine  Discontinued       03/18/2024   11:40 AM  Advanced Directives  Does Patient Have a Medical Advance Directive? Yes  Type of Estate Agent of [place];Living will  Does patient want to make changes to medical advance directive? No - Patient declined  Copy of Healthcare Power of Attorney in Chart? No - copy requested    Vision: Annual vision screenings are recommended for early detection of glaucoma, cataracts, and diabetic retinopathy. These exams can also reveal signs of chronic conditions such as diabetes and high blood pressure.  Dental: Annual dental screenings help detect early  signs of oral cancer, gum disease, and other conditions linked to overall health, including heart disease and diabetes.  Please see the attached documents for additional preventive care recommendations.

## 2024-03-24 ENCOUNTER — Telehealth: Payer: Self-pay

## 2024-03-24 DIAGNOSIS — E782 Mixed hyperlipidemia: Secondary | ICD-10-CM

## 2024-03-24 MED ORDER — ROSUVASTATIN CALCIUM 20 MG PO TABS: 20.0000 mg | ORAL_TABLET | Freq: Every day | ORAL | 1 refills | Status: AC

## 2024-03-24 NOTE — Telephone Encounter (Signed)
 Copied from CRM [id number]. Topic: Clinical - Medication Refill >> Mar 24, 2024 12:55 PM Alfonso HERO wrote: Medication: rosuvastatin  (CRESTOR ) 20 MG tablet  Has the patient contacted their pharmacy? Yes (Agent: If no, request that the patient contact the pharmacy for the refill. If patient does not wish to contact the pharmacy document the reason why and proceed with request.) (Agent: If yes, when and what did the pharmacy advise?)  This is the patient's preferred pharmacy:  [id number] [address], [place] - [address] [place] [address] Phone: [phone] Fax: [phone]   Is this the correct pharmacy for this prescription? Yes If no, delete pharmacy and type the correct one.   Has the prescription been filled recently? Yes  Is the patient out of the medication? Yes  Has the patient been seen for an appointment in the last year OR does the patient have an upcoming appointment? Yes  Can we respond through MyChart? Yes  Agent: Please be advised that Rx refills may take up to 3 business days. We ask that you follow-up with your pharmacy.

## 2024-04-09 ENCOUNTER — Other Ambulatory Visit: Payer: Self-pay | Admitting: Family Medicine

## 2024-05-26 ENCOUNTER — Ambulatory Visit: Admitting: Adult Health

## 2024-05-29 ENCOUNTER — Ambulatory Visit: Admitting: Family Medicine

## 2024-05-29 ENCOUNTER — Ambulatory Visit: Admitting: Student

## 2024-12-09 ENCOUNTER — Inpatient Hospital Stay

## 2024-12-09 ENCOUNTER — Ambulatory Visit: Admitting: Medical Oncology

## 2025-03-24 ENCOUNTER — Ambulatory Visit
